# Patient Record
Sex: Female | Born: 1950 | Race: Black or African American | Hispanic: No | Marital: Married | Smoking: Former smoker
Health system: Southern US, Community
[De-identification: ages and names within clinical notes are randomized; demographics above are authoritative.]

## PROBLEM LIST (undated history)

## (undated) ENCOUNTER — Emergency Department (HOSPITAL_BASED_OUTPATIENT_CLINIC_OR_DEPARTMENT_OTHER): Payer: Medicare Other

## (undated) DIAGNOSIS — Z860100 Personal history of colon polyps, unspecified: Secondary | ICD-10-CM

## (undated) DIAGNOSIS — E559 Vitamin D deficiency, unspecified: Secondary | ICD-10-CM

## (undated) DIAGNOSIS — Z87442 Personal history of urinary calculi: Secondary | ICD-10-CM

## (undated) DIAGNOSIS — E785 Hyperlipidemia, unspecified: Secondary | ICD-10-CM

## (undated) DIAGNOSIS — J302 Other seasonal allergic rhinitis: Secondary | ICD-10-CM

## (undated) DIAGNOSIS — R011 Cardiac murmur, unspecified: Secondary | ICD-10-CM

## (undated) DIAGNOSIS — T7840XA Allergy, unspecified, initial encounter: Secondary | ICD-10-CM

## (undated) DIAGNOSIS — D649 Anemia, unspecified: Secondary | ICD-10-CM

## (undated) DIAGNOSIS — J189 Pneumonia, unspecified organism: Secondary | ICD-10-CM

## (undated) DIAGNOSIS — Z8709 Personal history of other diseases of the respiratory system: Secondary | ICD-10-CM

## (undated) DIAGNOSIS — M1990 Unspecified osteoarthritis, unspecified site: Secondary | ICD-10-CM

## (undated) DIAGNOSIS — I10 Essential (primary) hypertension: Secondary | ICD-10-CM

## (undated) DIAGNOSIS — K5792 Diverticulitis of intestine, part unspecified, without perforation or abscess without bleeding: Secondary | ICD-10-CM

## (undated) DIAGNOSIS — K219 Gastro-esophageal reflux disease without esophagitis: Secondary | ICD-10-CM

## (undated) DIAGNOSIS — R7303 Prediabetes: Secondary | ICD-10-CM

## (undated) HISTORY — PX: BREAST BIOPSY: SHX20

## (undated) HISTORY — DX: Personal history of urinary calculi: Z87.442

## (undated) HISTORY — PX: TUBAL LIGATION: SHX77

## (undated) HISTORY — PX: COLONOSCOPY: SHX174

## (undated) HISTORY — DX: Diverticulitis of intestine, part unspecified, without perforation or abscess without bleeding: K57.92

## (undated) HISTORY — PX: POLYPECTOMY: SHX149

## (undated) HISTORY — DX: Hyperlipidemia, unspecified: E78.5

## (undated) HISTORY — PX: KNEE ARTHROSCOPY: SUR90

## (undated) HISTORY — DX: Personal history of colonic polyps: Z86.010

## (undated) HISTORY — DX: Gastro-esophageal reflux disease without esophagitis: K21.9

## (undated) HISTORY — DX: Other seasonal allergic rhinitis: J30.2

## (undated) HISTORY — DX: Unspecified osteoarthritis, unspecified site: M19.90

## (undated) HISTORY — DX: Vitamin D deficiency, unspecified: E55.9

## (undated) HISTORY — PX: LAPAROSCOPIC LOW ANTERIOR RESECTION: SHX5904

## (undated) HISTORY — DX: Essential (primary) hypertension: I10

## (undated) HISTORY — DX: Allergy, unspecified, initial encounter: T78.40XA

## (undated) HISTORY — DX: Personal history of colon polyps, unspecified: Z86.0100

---

## 1994-06-10 HISTORY — PX: ESOPHAGUS SURGERY: SHX626

## 2010-10-11 LAB — HM MAMMOGRAPHY: HM Mammogram: NORMAL

## 2011-07-03 ENCOUNTER — Encounter: Payer: Self-pay | Admitting: Internal Medicine

## 2011-07-03 ENCOUNTER — Ambulatory Visit (INDEPENDENT_AMBULATORY_CARE_PROVIDER_SITE_OTHER): Payer: Federal, State, Local not specified - PPO | Admitting: Internal Medicine

## 2011-07-03 DIAGNOSIS — I10 Essential (primary) hypertension: Secondary | ICD-10-CM | POA: Insufficient documentation

## 2011-07-03 DIAGNOSIS — M1710 Unilateral primary osteoarthritis, unspecified knee: Secondary | ICD-10-CM

## 2011-07-03 DIAGNOSIS — K219 Gastro-esophageal reflux disease without esophagitis: Secondary | ICD-10-CM | POA: Insufficient documentation

## 2011-07-03 DIAGNOSIS — N200 Calculus of kidney: Secondary | ICD-10-CM | POA: Insufficient documentation

## 2011-07-03 DIAGNOSIS — M1712 Unilateral primary osteoarthritis, left knee: Secondary | ICD-10-CM | POA: Insufficient documentation

## 2011-07-03 MED ORDER — LOSARTAN POTASSIUM-HCTZ 100-25 MG PO TABS: 1.0000 | ORAL_TABLET | Freq: Every day | ORAL | Status: DC

## 2011-07-03 NOTE — Assessment & Plan Note (Signed)
BP Readings from Last 3 Encounters:  07/03/11 140/82   Change lotrel to max dose ARB+hctz -  may need to resume amlodipine if subop control Cough on ACEI noted Recheck 6 weeks to titrate, sooner if needed

## 2011-07-03 NOTE — Assessment & Plan Note (Signed)
Will send fpor orthopedic records from [facility] and refer to local specialist given pending need for knee replacement (per pt ) recommended tylenol as needed with aleve prn

## 2011-07-03 NOTE — Patient Instructions (Signed)
It was good to see you today. We have reviewed your prior records including labs and tests today we will send to your prior provider(s) for "release of records" as discussed today -  Stop Lotrel and start losartan hct for blood pressure at lower cost - Your prescription(s) have been submitted to your pharmacy. Please take as directed and contact our office if you believe you are having problem(s) with the medication(s). we'll make referral to local orthopedist and urologist as discussed. Our office will contact you regarding appointment(s) once made. Please schedule followup in 6 weeks to recheck blood pressure and adjust medications as needed, call sooner if problems.

## 2011-07-03 NOTE — Progress Notes (Signed)
Subjective:    Patient ID: Frances Gray, female    DOB: Jan 20, 1951, 60 y.o.   MRN: [id number]  HPI New pt to me and our practice, here to establish care -moved from [place] to [place] in summer 2012 Reviewed chronic medical issues today  HTN - the patient reports compliance with medication(s) as prescribed. Denies adverse side effects. Requests change meds due to $concerns - no chest pain, headache or edema  Kidney stones - recurrent events in past 6 months since moving to [place]. Has been to the emergency room in [facility] recently and advised to see urology for same - requests referral assistance for this now in [place] location practice  osteoarthritis - R>L pain, knee scope in [place] for same 2006 - takes OTC aleve 2-3x/week for control of pain symptoms - no swelling but previously told needs partial knee replacement - would like to meet local ortho  Past Medical History  Diagnosis Date  . Arthritis   . GERD (gastroesophageal reflux disease)   . Allergic rhinitis, seasonal   . Hypertension   . History of chicken pox   . Diverticulitis   . Eating disorder   . Emphysema   . Headaches, cluster   . History of colon polyps   . History of kidney stones    Family History  Problem Relation Age of Onset  . Ovarian cancer Mother   . Colon cancer Other   . Hypertension Other   . Hyperlipidemia Other   . Heart disease Other   . Breast cancer Other    History  Substance Use Topics  . Smoking status: Former Games developer  . Smokeless tobacco: Not on file  . 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  Respiratory: Negative for cough and shortness of breath.   Cardiovascular: Negative for chest pain or palpitations.  Gastrointestinal: Negative for abdominal pain, no bowel changes.  Musculoskeletal: Negative for gait  problem or joint swelling.  Skin: Negative for rash.  Neurological: Negative for dizziness or headache.  No other specific complaints in a complete review of systems (except as listed in HPI above).     Objective:   Physical Exam BP 140/82  Pulse 65  Temp(Src) 97.9 F (36.6 C) (Oral)  Ht 5\' 4"  (1.626 m)  Wt 249 lb 12.8 oz (113.309 kg)  BMI 42.88 kg/m2  SpO2 96% Wt Readings from Last 3 Encounters:  07/03/11 249 lb 12.8 oz (113.309 kg)   Constitutional: She is obese but appears well-developed and well-nourished. No distress.  HENT: Head: Normocephalic and atraumatic. Ears: B TMs ok, no erythema or effusion; Nose: Nose normal. Mouth/Throat: Oropharynx is clear and moist. No oropharyngeal exudate.  Eyes: Wears glasses - EOM are normal. Pupils are equal, round, and reactive to light. No scleral icterus.  Neck: Normal range of motion. Neck supple. No JVD present. No thyromegaly present.  Cardiovascular: Normal rate, regular rhythm and normal heart sounds.  No murmur heard. No BLE edema. Pulmonary/Chest: Effort normal and breath sounds normal. No respiratory distress. She has no wheezes.  Abdominal: Soft. Bowel sounds are normal. She exhibits no distension. There is no tenderness. no masses Musculoskeletal: R>L knee - boggy synovitis - tender to palpation over joint line; FROM and ligamentous function intact - otherwise, normal range of motion, no joint effusions. No gross deformities Neurological: She is alert and oriented to  person, place, and time. No cranial nerve deficit. Coordination normal.  Skin: Skin is warm and dry. No rash noted. No erythema.  Psychiatric: She has a normal mood and affect. Her behavior is normal. Judgment and thought content normal.   No results found for this basename: WBC, HGB, HCT, PLT, GLUCOSE, CHOL, TRIG, HDL, LDLDIRECT, LDLCALC, ALT, AST, NA, K, CL, CREATININE, BUN, CO2, TSH, PSA, INR, GLUF, HGBA1C, MICROALBUR       Assessment & Plan:  See problem list.  Medications and labs reviewed today.  Time spent with patient today 45 minutes, greater than 50% time spent counseling patient on osteoarthritis, hypertension and medication review. Also need to obtain and review of prior records from providers in [place]

## 2011-07-03 NOTE — Assessment & Plan Note (Signed)
Recurrent issue - worse since move to [place] from [facility] summer 2012 Will refer to uro at pt request -

## 2011-07-11 ENCOUNTER — Other Ambulatory Visit: Payer: Self-pay

## 2011-07-11 MED ORDER — PROMETHAZINE HCL 25 MG PO TABS: 25.0000 mg | ORAL_TABLET | Freq: Four times a day (QID) | ORAL | Status: AC | PRN

## 2011-07-11 MED ORDER — HYDROCODONE-ACETAMINOPHEN 5-500 MG PO TABS: 1.0000 | ORAL_TABLET | Freq: Four times a day (QID) | ORAL | Status: AC | PRN

## 2011-07-11 NOTE — Telephone Encounter (Signed)
Pt's spouse informed 

## 2011-07-11 NOTE — Telephone Encounter (Signed)
Ok thanks 

## 2011-07-11 NOTE — Telephone Encounter (Signed)
Pt's spouse called stating pt has an "attack" of kidney stones last night and was seen at the [facility] UC. Pt is now c/o nausea and pain. She was not given medication for nausea and only a small quantity of Lortab which will not be enough to last until Urology appt 02/08. Pt is requesting Rx for nausea as well as refill of Lortab, please advise.

## 2011-07-16 ENCOUNTER — Telehealth: Payer: Self-pay | Admitting: *Deleted

## 2011-07-16 NOTE — Telephone Encounter (Signed)
MD received films on pt mammogram [facility]. Per md for pt to come get film because we don't have away to keep fils. Called pt she states she will come on Thursday after her urologist appt.... 07/16/11@4 :06pm/LMB

## 2011-07-22 NOTE — Telephone Encounter (Signed)
Pt came by this am to pick -up films. Gave to [place] to give to patient... 07/22/11@12 :14pm/LMB

## 2011-07-26 ENCOUNTER — Telehealth: Payer: Self-pay | Admitting: Internal Medicine

## 2011-07-26 NOTE — Telephone Encounter (Signed)
Received 27 pages of records from [facility] 07/26/2011 Forwarded to Dr. Felicity Coyer to review 07/26/2011 ASW.

## 2011-08-05 ENCOUNTER — Other Ambulatory Visit: Payer: Self-pay | Admitting: Urology

## 2011-08-06 ENCOUNTER — Encounter (HOSPITAL_COMMUNITY): Payer: Self-pay | Admitting: *Deleted

## 2011-08-06 NOTE — Progress Notes (Signed)
Reminded no aspirin products 4 days prior to ESWL and to take laxative 08/11/11/pm

## 2011-08-08 ENCOUNTER — Encounter (HOSPITAL_COMMUNITY): Payer: Self-pay | Admitting: Pharmacy Technician

## 2011-08-09 HISTORY — PX: LITHOTRIPSY: SUR834

## 2011-08-09 NOTE — H&P (Addendum)
History of Present Illness  [name] seen today as a referral from Dr. Rene Paci for evaluation of nephrolithasis. She initially had right flank pain 2 months ago and then had another colic attack about a month ago. She was seen at [facility] ER for right flank pain. CTU: moderate right hydronephrosis secondary to 7 mm right UPJ calculus. She is taking Tamsulosin 0.4 mg 1 po daily. Pain is controlled with PRN Hydrocodone. This is her 3rd stone episode. Last stone was in 2007 which she feels she passed spontanously but denies ever seeing stone. Denies any surgical intervention of stones in the past ie: ESWL or cystourerterscopy, stone extraction.   Interval Hx She has not passed a stone since her last visit. She has occasional mild right flank pain. No N/V, F/C. She continues on tamsulosin.   KUB today: There is a stone visible at the right ureteropelvic junction. The stone has failed to progress. There is also a stone in the right midpole.    Past Medical History Problems  1. History of  Arthritis V13.4 2. History of  Esophageal Reflux 530.81 3. History of  Hypertension 401.9 4. History of  Murmurs 785.2 5. History of  Nephrolithiasis V13.01  Surgical History Problems  1. History of  Arthroscopy Knee Left 2. History of  Arthroscopy Knee Right 3. History of  Breast Surgery Right 4. History of  Breast Surgery Left 5. History of  Surgery Of Esophagus  Current Meds 1. Hydrocodone-Acetaminophen 7.5-325 MG Oral Tablet; TAKE 1 TO 2 TABLETS EVERY 4 TO 6  HOURS AS NEEDED FOR PAIN; Therapy: 07Feb2013 to (Evaluate:11Feb2013); Last  Rx:07Feb2013 2. Losartan Potassium-HCTZ 100-25 MG Oral Tablet; Therapy: (Recorded:07Feb2013) to 3. Multi-Vitamin Oral Tablet; Therapy: (Recorded:07Feb2013) to 4. Promethazine HCl 25 MG Oral Tablet; Therapy: (Recorded:07Feb2013) to 5. Tamsulosin HCl 0.4 MG Oral Capsule; TAKE 1 CAPSULE Daily; Therapy: 07Feb2013 to  (Evaluate:21Feb2013)  Requested for:  07Feb2013; Last Rx:07Feb2013 6. Vitamin D3 CAPS; Therapy: (Recorded:07Feb2013) to  Allergies Medication  1. No Known Drug Allergies  Family History Problems  1. Sororal history of  Breast Cancer V16.3 2. Sororal history of  Colon Cancer V16.0 3. Family history of  Death In The Family Father 4. Family history of  Death In The Family Mother Deceased; ovarian cancer 5. Family history of  Family Health Status Number Of Children 1 son & 1 daughter 32. Paternal history of  Nephrolithiasis  Social History Problems  1. Caffeine Use 3 2. Former Smoker V15.82 quit in 1986 3. Marital History - Currently Married 4. Occupation: Retired Nurse, children's  5. History of  Alcohol Use  Review of Systems Constitutional, skin, eye, otolaryngeal, hematologic/lymphatic, cardiovascular, pulmonary, endocrine, musculoskeletal, gastrointestinal, neurological and psychiatric system(s) were reviewed and pertinent findings if present are noted.    Vitals  25Feb2013 08:35AM Blood Pressure: 136 / 86 Temperature: 97.7 F Heart Rate: 56   Physical Exam Constitutional: Well nourished and well developed . No acute distress.  Pulmonary: No respiratory distress and normal respiratory rhythm and effort.  Cardiovascular: Heart rate and rhythm are normal . No peripheral edema.  Neuro/Psych:. Mood and affect are appropriate.    Results/Data Selected Results  UA With REFLEX 25Feb2013 08:13AM Frances Gray   Test Name Result Flag Reference  COLOR STRAW  YELLOW  APPEARANCE CLEAR  CLEAR  SPECIFIC GRAVITY 1.020  1.005-1.030  pH 5.0  5.0-8.0  GLUCOSE NEG mg/dL  NEG  BILIRUBIN NEG  NEG  KETONE NEG mg/dL  NEG  BLOOD NEG  NEG  PROTEIN  NEG mg/dL  NEG  UROBILINOGEN 0.2 mg/dL  1.6-1.0  NITRITE NEG  NEG  LEUKOCYTE ESTERASE NEG  NEG   Assessment Assessed  1. Hydronephrosis On The Right 591 2. Ureteral Stone 592.1  Plan Health Maintenance (V70.0)  1. UA With REFLEX  Done: 25Feb2013 08:13AM Hydronephrosis On  The Right (591), Ureteral Stone (592.1), Nephrolithiasis Of The Right Kidney (592.0)  2. Follow-up Schedule Surgery Office  Follow-up  Done: 25Feb2013  Discussion/Summary  I reviewed the KUB image with the patient and her husband and drew a picture of the anatomy. We discussed the nature, risks and benefits of continued stone passage with medical expulsion therapy (alpha blockers), cysto/stent/ureteroscopy/laser lithotripsy (fail to gain retrograde access, need for repeat therapy/multiple procedures, ureteral injury, stent pain among others), ESWL (failure to fragment, failure to pass fragments, obstruction, need for additional procedures/stenting, bleeding requiring transfusion or embolization, infection, damage to kidney/ureter/other structures, pain among others). All questions answered. Pt elects to proceed with right SWL. I emphasized importance of bringing the disc so that I can review her CT scan images. We discussed treatment goals may not be reached and/or may require multiple/repeat procedures. We discussed the likelihood of achieving the goals of the procedure and potential problems that might occur during the procedure or recuperation. We discussed given the nature of shockwave we would concentrate on the right UPJ stone and likely not be able to treat the right kidney stone.  H&P update Pt seen and examined. No change in H&P. No stone passage. CT reviewed images -- 7 mm rt UPJ stone and a separate RLP stone.

## 2011-08-12 ENCOUNTER — Ambulatory Visit (HOSPITAL_COMMUNITY): Payer: Federal, State, Local not specified - PPO

## 2011-08-12 ENCOUNTER — Ambulatory Visit (HOSPITAL_COMMUNITY)
Admission: RE | Admit: 2011-08-12 | Discharge: 2011-08-12 | Disposition: A | Discharge status: Home / Self Care | Payer: Federal, State, Local not specified - PPO | Source: Ambulatory Visit | Attending: Urology | Admitting: Urology

## 2011-08-12 ENCOUNTER — Encounter (HOSPITAL_COMMUNITY)
Admission: RE | Disposition: A | Discharge status: Home / Self Care | Payer: Self-pay | Source: Ambulatory Visit | Attending: Urology

## 2011-08-12 ENCOUNTER — Encounter (HOSPITAL_COMMUNITY): Payer: Self-pay | Admitting: *Deleted

## 2011-08-12 DIAGNOSIS — N201 Calculus of ureter: Secondary | ICD-10-CM | POA: Insufficient documentation

## 2011-08-12 DIAGNOSIS — K219 Gastro-esophageal reflux disease without esophagitis: Secondary | ICD-10-CM | POA: Insufficient documentation

## 2011-08-12 DIAGNOSIS — R011 Cardiac murmur, unspecified: Secondary | ICD-10-CM | POA: Insufficient documentation

## 2011-08-12 DIAGNOSIS — I10 Essential (primary) hypertension: Secondary | ICD-10-CM | POA: Insufficient documentation

## 2011-08-12 DIAGNOSIS — M129 Arthropathy, unspecified: Secondary | ICD-10-CM | POA: Insufficient documentation

## 2011-08-12 DIAGNOSIS — Z79899 Other long term (current) drug therapy: Secondary | ICD-10-CM | POA: Insufficient documentation

## 2011-08-12 HISTORY — DX: Cardiac murmur, unspecified: R01.1

## 2011-08-12 HISTORY — DX: Anemia, unspecified: D64.9

## 2011-08-12 SURGERY — LITHOTRIPSY, ESWL
Anesthesia: LOCAL | Laterality: Right

## 2011-08-12 MED ORDER — DEXTROSE-NACL 5-0.45 % IV SOLN
INTRAVENOUS | Status: DC
  Administered 2011-08-12: 07:00:00 via INTRAVENOUS

## 2011-08-12 MED ORDER — MORPHINE SULFATE 10 MG/ML IJ SOLN: 2.0000 mg | INTRAMUSCULAR | Status: DC | PRN

## 2011-08-12 MED ORDER — DIPHENHYDRAMINE HCL 25 MG PO CAPS
25.0000 mg | ORAL_CAPSULE | ORAL | Status: AC
  Administered 2011-08-12: 25 mg via ORAL

## 2011-08-12 MED ORDER — CIPROFLOXACIN HCL 500 MG PO TABS
500.0000 mg | ORAL_TABLET | ORAL | Status: AC
  Administered 2011-08-12: 500 mg via ORAL

## 2011-08-12 MED ORDER — PROMETHAZINE HCL 25 MG/ML IJ SOLN: 12.5000 mg | Freq: Four times a day (QID) | INTRAMUSCULAR | Status: DC | PRN

## 2011-08-12 MED ORDER — NAPROXEN SODIUM 220 MG PO TABS: 220.0000 mg | ORAL_TABLET | Freq: Two times a day (BID) | ORAL | Status: DC | PRN

## 2011-08-12 MED ORDER — DIAZEPAM 5 MG PO TABS
10.0000 mg | ORAL_TABLET | ORAL | Status: AC
Start: 2011-08-12 — End: 2011-08-12
  Administered 2011-08-12: 10 mg via ORAL

## 2011-08-12 MED ORDER — OXYCODONE-ACETAMINOPHEN 5-325 MG PO TABS: 1.0000 | ORAL_TABLET | ORAL | Status: AC | PRN

## 2011-08-12 MED ORDER — CIPROFLOXACIN HCL 500 MG PO TABS: 500.0000 mg | ORAL_TABLET | Freq: Two times a day (BID) | ORAL | Status: AC

## 2011-08-12 NOTE — Discharge Instructions (Signed)
Lithotripsy Care After Refer to this sheet for the next few weeks. These discharge instructions provide you with general information on caring for yourself after you leave the hospital. Your caregiver may also give you specific instructions. Your treatment has been planned according to the most current medical practices available, but unavoidable complications sometimes occur. If you have any problems or questions after discharge, please call your caregiver. AFTER THE PROCEDURE   The recovery time will vary with the procedure done.   You will be taken to the recovery area. A nurse will watch and check your progress. Once you are awake, stable, and taking fluids well, you will be allowed to go home as long as there are no problems.   Your urine may have a red tinge for a few days after treatment. Blood loss is usually minimal.   You may have soreness in the back or flank area. This usually goes away after a few days. The procedure can cause blotches or bruises on the back where the pressure wave enters the skin. These marks usually cause only minimal discomfort and should disappear in a short time.   Stone fragments should begin to pass within 24 hours of treatment. However, a delayed passage is not unusual.   You may have pain, discomfort, and feel sick to your stomach (nauseous) when the crushed (pulverized) fragments of stone are passed down the tube from the kidney to the bladder. Stone fragments can pass soon after the procedure and may last for up to 4 to 8 weeks.   A small number of patients may have severe pain when stone fragments are not able to pass, which leads to an obstruction.   You may require more than 1 treatment.  HOME CARE INSTRUCTIONS   Rest at home until you feel your energy improving.   Only take over-the-counter or prescription medicines for pain, discomfort, or fever as directed by your caregiver. Depending on the type of lithotripsy, you may need to take medicines  (antibiotics) that kill germs and anti-inflammatory medicines for a few days.   Drink enough water and fluids to keep your urine clear or pale yellow. This helps "flush" your kidneys. It helps pass any remaining pieces of stone and prevents stones from coming back.   Most people can resume daily activities within 1 or 2 days after standard lithotripsy.    If the stones are in your urinary system, you may be asked to strain your urine at home to look for stones. Any stones that are found can be sent to a medical lab for examination.   Visit your caregiver for a follow-up appointment in a few weeks. Your doctor may remove your stent if you have one. Your caregiver will also check to see whether stone particles still remain.  SEEK MEDICAL CARE IF:   You have an oral temperature above 102 F (38.9 C).   Your pain is not relieved by medicine.   You have a lasting nauseous feeling.   You feel there is too much blood in the urine.   You develop persistent problems with frequent and/or painful urination that does not at least partially improve after 2 days following the procedure.   You have a congested cough.   You feel lightheaded.   You develop a rash or any other signs that might suggest an allergic problem.   You develop any reaction or side effects to your medicine(s).  SEEK IMMEDIATE MEDICAL CARE IF:   You experience severe back  and/or flank pain.   You see nothing but blood when you urinate.   You cannot pass any urine at all.   You have an oral temperature above 102 F (38.9 C), not controlled by medicine.   You develop shortness of breath, difficulty breathing, or chest pain.   You develop vomiting that will not stop after 6 to 8 hours.   You have a fainting episode.  MAKE SURE YOU:   Understand these instructions.   Will watch your condition.   Will get help right away if you are not doing well or get worse.  Document Released: 06/16/2007 Document Revised:  05/16/2011 Document Reviewed: 06/16/2007 [facility] Patient Information 2012 [place], [place].

## 2011-08-12 NOTE — Brief Op Note (Signed)
08/12/2011  8:17 AM  PATIENT:  Frances Gray  61 y.o. female  PRE-OPERATIVE DIAGNOSIS:  Right Ureteral Stone  POST-OPERATIVE DIAGNOSIS: right ureteral stone  PROCEDURE:  Procedure(s) (LRB): EXTRACORPOREAL SHOCK WAVE LITHOTRIPSY (ESWL) (Right)  SURGEON:  Surgeon(s) and Role:    * Antony Haste, MD - Primary  ANESTHESIA:   IV sedation  DICTATION: scanned  PLAN OF CARE: Discharge to home after PACU  PATIENT DISPOSITION:  PACU - hemodynamically stable.   Delay start of Pharmacological VTE agent (>24hrs) due to surgical blood loss or risk of bleeding: not applicable

## 2011-08-13 ENCOUNTER — Encounter (HOSPITAL_COMMUNITY): Payer: Self-pay

## 2011-08-14 ENCOUNTER — Ambulatory Visit (INDEPENDENT_AMBULATORY_CARE_PROVIDER_SITE_OTHER): Payer: Federal, State, Local not specified - PPO | Admitting: Internal Medicine

## 2011-08-14 ENCOUNTER — Encounter: Payer: Self-pay | Admitting: Internal Medicine

## 2011-08-14 ENCOUNTER — Other Ambulatory Visit (INDEPENDENT_AMBULATORY_CARE_PROVIDER_SITE_OTHER): Payer: Federal, State, Local not specified - PPO

## 2011-08-14 DIAGNOSIS — D649 Anemia, unspecified: Secondary | ICD-10-CM | POA: Insufficient documentation

## 2011-08-14 DIAGNOSIS — M1710 Unilateral primary osteoarthritis, unspecified knee: Secondary | ICD-10-CM

## 2011-08-14 DIAGNOSIS — E559 Vitamin D deficiency, unspecified: Secondary | ICD-10-CM

## 2011-08-14 DIAGNOSIS — N200 Calculus of kidney: Secondary | ICD-10-CM

## 2011-08-14 DIAGNOSIS — I10 Essential (primary) hypertension: Secondary | ICD-10-CM

## 2011-08-14 HISTORY — DX: Vitamin D deficiency, unspecified: E55.9

## 2011-08-14 LAB — CBC WITH DIFFERENTIAL/PLATELET
Basophils Relative: 1.2 % (ref 0.0–3.0)
Eosinophils Relative: 3.5 % (ref 0.0–5.0)
HCT: 37.6 % (ref 36.0–46.0)
Lymphs Abs: 1.5 10*3/uL (ref 0.7–4.0)
MCV: 83.8 fl (ref 78.0–100.0)
Monocytes Absolute: 0.7 10*3/uL (ref 0.1–1.0)
Monocytes Relative: 11.4 % (ref 3.0–12.0)
RBC: 4.49 Mil/uL (ref 3.87–5.11)
WBC: 5.8 10*3/uL (ref 4.5–10.5)

## 2011-08-14 LAB — VITAMIN B12: Vitamin B-12: 331 pg/mL (ref 211–911)

## 2011-08-14 NOTE — Progress Notes (Signed)
  Subjective:    Patient ID: Frances Gray, female    DOB: 23-Feb-1951, 61 y.o.   MRN: [id number]  HPI  Here for follow up - reviewed chronic medical issues today  HTN - the patient reports compliance with medication(s) as prescribed. Denies adverse side effects. Requests changed Lotrel to generic ARB/hct 06/2011 due to $concerns - no chest pain, headache or edema  Kidney stones - recurrent events summer-fall 2012 since moving to [place]. S/p lithotripsy for R ur. Stone 08/2011 - imporved, following with uro now  osteoarthritis - R>L pain, knee scope in [place] for same 2006 - takes OTC aleve 2-3x/week for control of pain symptoms - no swelling but previously told needs partial knee replacement - est with local ortho 07/2011 ([place])  Past Medical History  Diagnosis Date  . Arthritis     R knee  . GERD (gastroesophageal reflux disease)   . Allergic rhinitis, seasonal   . Hypertension   . History of chicken pox   . Diverticulitis   . Emphysema   . Headaches, cluster   . History of colon polyps   . Heart murmur     no symptoms  . Anemia     has had anemia in the past. none now  . Recurrent kidney stones 04/2011    Rt ureteral stone s/p lithotripsy 3/13    History  Substance Use Topics  . Smoking status: Former Games developer  . Smokeless tobacco: Not on file  . 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  Respiratory: Negative for cough and shortness of breath.   Cardiovascular: Negative for chest pain or palpitations.       Objective:   Physical Exam  BP 122/80  Pulse 59  Temp(Src) 97.6 F (36.4 C) (Oral)  Ht 5\' 3"  (1.6 m)  Wt 248 lb 6.4 oz (112.674 kg)  BMI 44.00 kg/m2  SpO2 98% Wt Readings from Last 3 Encounters:  08/14/11 248 lb 6.4 oz (112.674 kg)  08/12/11 245 lb 4 oz (111.245 kg)  08/12/11 245 lb 4 oz  (111.245 kg)   Constitutional: She is obese but appears well-developed and well-nourished. No distress.  Neck: Normal range of motion. Neck supple. No JVD present. No thyromegaly present.  Cardiovascular: Normal rate, regular rhythm and normal heart sounds.  No murmur heard. No BLE edema. Pulmonary/Chest: Effort normal and breath sounds normal. No respiratory distress. She has no wheezes.  Musculoskeletal: R>L knee - boggy synovitis - tender to palpation over joint line; FROM and ligamentous function intact - otherwise, normal range of motion, no joint effusions. No gross deformities Psychiatric: She has a normal mood and affect. Her behavior is normal. Judgment and thought content normal.   No results found for this basename: WBC,  HGB,  HCT,  PLT,  GLUCOSE,  CHOL,  TRIG,  HDL,  LDLDIRECT,  LDLCALC,  ALT,  AST,  NA,  K,  CL,  CREATININE,  BUN,  CO2,  TSH,  PSA,  INR,  GLUF,  HGBA1C,  MICROALBUR       Assessment & Plan:  See problem list. Medications and labs reviewed today.

## 2011-08-14 NOTE — Assessment & Plan Note (Signed)
Recurrent issue - worse since move to [place] from [facility] summer 2012 S/p uro eval and lithotripsy 08/2011 - reviewed No current symptoms

## 2011-08-14 NOTE — Assessment & Plan Note (Signed)
BP Readings from Last 3 Encounters:  08/14/11 122/80  08/12/11 163/85  08/12/11 163/85   Changed lotrel to max dose ARB+hctz  06/2011 due to $ Cough on ACEI noted The current medical regimen is effective;  continue present plan and medications.

## 2011-08-14 NOTE — Assessment & Plan Note (Signed)
On OTC meds - recheck same now at pt request denies osteopenia or other bone problems but consider DEXA at next CPX

## 2011-08-14 NOTE — Assessment & Plan Note (Signed)
Pt describes hx same and "shots when <30" - ?aranesp for iron def or B12 per pt - last shot spring 2012 Check CBC and iron/B12 now -

## 2011-08-14 NOTE — Assessment & Plan Note (Signed)
Reviewed orthopedic records from [facility] - synvisc injection x 5 spring 2012 Following with local ortho ([place]) pending need for R knee replacement  recommended tylenol as needed with aleve prn

## 2011-08-14 NOTE — Patient Instructions (Signed)
It was good to see you today. We have reviewed your interval medical records including labs, kidney stone treatment and tests today Test(s) ordered today. Your results will be called to you after review (48-72hours after test completion). If any changes need to be made, you will be notified at that time. Medications reviewed, no changes at this time. Please schedule followup in 6 months for physical and labs + BP recheck, call sooner if problems.

## 2011-11-28 ENCOUNTER — Ambulatory Visit (INDEPENDENT_AMBULATORY_CARE_PROVIDER_SITE_OTHER): Payer: Federal, State, Local not specified - PPO | Admitting: Surgery

## 2011-11-28 ENCOUNTER — Encounter (INDEPENDENT_AMBULATORY_CARE_PROVIDER_SITE_OTHER): Payer: Self-pay | Admitting: Surgery

## 2011-11-28 VITALS — BP 138/86 | HR 66 | Temp 97.1°F | Resp 16 | Ht 64.0 in | Wt 250.2 lb

## 2011-11-28 DIAGNOSIS — E042 Nontoxic multinodular goiter: Secondary | ICD-10-CM

## 2011-11-28 DIAGNOSIS — E213 Hyperparathyroidism, unspecified: Secondary | ICD-10-CM

## 2011-11-28 DIAGNOSIS — N200 Calculus of kidney: Secondary | ICD-10-CM

## 2011-11-28 NOTE — Progress Notes (Signed)
General Surgery [facility]  Chief Complaint  Patient presents with  . New Evaluation    elevated PTH level - possible primary hyperparathyroidism - referral from Dr. [name]    HISTORY: The patient is a 61 year old female referred by her urologist for evaluation of possible primary hyperparathyroidism leading to nephrolithiasis. Patient has had 4 episodes of nephrolithiasis dating back several years. Patient was recently evaluated by urology. This included laboratory studies. Calcium levels were normal at 9.2 and 9.4. However the intact parathyroid hormone level was elevated at 100.1. 25 hydroxy vitamin D level was normal at 37. Patient apparently had a 24-hour urine collection for calcium. Those results are not available at present. Patient has not had any radiographic imaging.  Patient does note a family history of thyroid goiter in her brother. He required surgery. There is no family history of endocrine malignancy.  Past Medical History  Diagnosis Date  . Arthritis     R knee  . GERD (gastroesophageal reflux disease)   . Allergic rhinitis, seasonal   . Hypertension   . History of chicken pox   . Diverticulitis   . Emphysema   . Headaches, cluster   . History of colon polyps   . Heart murmur     no symptoms  . Anemia     has had anemia in the past. none now  . 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 Vitamin D deficiency disease 08/14/2011     Current Outpatient Prescriptions  Medication Sig Dispense Refill  . Cholecalciferol (VITAMIN D3) 2000 UNITS TABS Take 2,000 Units by mouth daily.       . Cyanocobalamin (B-12 PO) Take by mouth.      . losartan-hydrochlorothiazide (HYZAAR) 100-25 MG per tablet Take 1 tablet by mouth at bedtime.      . Multiple Vitamin (MULTIVITAMIN) tablet Take 1 tablet by mouth daily.      . naproxen sodium (ANAPROX) 220 MG tablet Take 1 tablet (220 mg total) by mouth 2 (two) times  daily as needed. PAIN         Allergies  Allergen Reactions  . Amlodipine Cough     Family History  Problem Relation Age of Onset  . Ovarian cancer Mother   . Cancer Mother     Ovarian Cancer  . Colon cancer Other   . Hypertension Other   . Hyperlipidemia Other   . Heart disease Other   . Breast cancer Other   . Cancer Sister     Breast & Colon Cancer     History   Social History  . Marital Status: Married    Spouse Name: N/A    Number of Children: N/A  . Years of Education: N/A   Social History Main Topics  . Smoking status: Former Games developer  . Smokeless tobacco: None  . Alcohol Use: No  . Drug Use: No  . Sexually Active: None   Other Topics Concern  . None   Social History Narrative  . None     REVIEW OF SYSTEMS - PERTINENT POSITIVES ONLY: Nephrolithiasis, moderate fatigue; no history of osteopenia or osteoporosis.  EXAM: Filed Vitals:   11/28/11 1133  BP: 138/86  Pulse: 66  Temp: 97.1 F (36.2 C)  Resp: 16    HEENT: normocephalic; pupils equal and reactive; sclerae clear; dentition good; mucous membranes moist NECK:  Thyroid gland is mildly enlarged, right greater than left; left lobe without dominant  or discrete mass; isthmus is thickened; right lobe appears to contain medical dominant nodules measuring approximately 2 cm in size; asymmetric on extension; no palpable anterior or posterior cervical lymphadenopathy; no supraclavicular masses; no tenderness CHEST: clear to auscultation bilaterally without rales, rhonchi, or wheezes CARDIAC: regular rate and rhythm without significant murmur; peripheral pulses are full EXT:  non-tender without edema; no deformity NEURO: no gross focal deficits; no sign of tremor   LABORATORY RESULTS: See [facility] (CHL-Epic) for most recent results   RADIOLOGY RESULTS: See [facility] (CHL[facility]Epic) for most recent results   IMPRESSION: #1 elevated parathyroid hormone level, rule out primary  hyperparathyroidism #2 enlarged thyroid with probable right thyroid nodules  PLAN: I discussed the above findings at length with the patient and her husband. I provided them with written literature to review on parathyroid disease and thyroid disease.  As for the possible parathyroid adenoma, we are going to repeat her intact PTH level and serum calcium level. We will then obtain a nuclear medicine parathyroid scan.  As for the newly diagnosed thyroid nodules, patient will undergo a thyroid ultrasound. We will obtain thyroid function test.  Patient will return for followup and review of these results when they are completed.  Velora Heckler, MD, FACS General & Endocrine Surgery [facility]   Visit Diagnoses: 1. Kidney stone   2. Hyperparathyroidism   3. Multiple thyroid nodules   4. Multinodular goiter (nontoxic)     Primary Care Physician: Rene Paci, MD

## 2011-11-28 NOTE — Patient Instructions (Signed)
Thyroid Diseases Your thyroid is a butterfly-shaped gland in your neck. It is located just above your collarbone. It is one of your endocrine glands, which make hormones. The thyroid helps set your metabolism. Metabolism is how your body gets energy from the foods you eat.  Millions of people have thyroid diseases. Women experience thyroid problems more often than men. In fact, overactive thyroid problems (hyperthyroidism) occur in 1% of all women. If you have a thyroid disease, your body may use energy more slowly or quickly than it should.  Thyroid problems also include an immune disease where your body reacts against your thyroid gland (called thyroiditis). A different problem involves lumps and bumps (called nodules) that develop in the gland. The nodules are usually, but not always, noncancerous. THE MOST COMMON THYROID PROBLEMS AND CAUSES ARE DISCUSSED BELOW There are many causes for thyroid problems. Treatment depends upon the exact diagnosis and includes trying to reset your body's metabolism to a normal rate. Hyperthyroidism Too much thyroid hormone from an overactive thyroid gland is called hyperthyroidism. In hyperthyroidism, the body's metabolism speeds up. One of the most frequent forms of hyperthyroidism is known as Graves' disease. Graves' disease tends to run in families. Although Graves' is thought to be caused by a problem with the immune system, the exact nature of the genetic problem is unknown. Hypothyroidism Too little thyroid hormone from an underactive thyroid gland is called hypothyroidism. In hypothyroidism, the body's metabolism is slowed. Several things can cause this condition. Most causes affect the thyroid gland directly and hurt its ability to make enough hormone.  Rarely, there may be a pituitary gland tumor (located near the base of the brain). The tumor can block the pituitary from producing thyroid-stimulating hormone (TSH). Your body makes TSH to stimulate the thyroid  to work properly. If the pituitary does not make enough TSH, the thyroid fails to make enough hormones needed for good health. Whether the problem is caused by thyroid conditions or by the pituitary gland, the result is that the thyroid is not making enough hormones. Hypothyroidism causes many physical and mental processes to become sluggish. The body consumes less oxygen and produces less body heat. Thyroid Nodules A thyroid nodule is a small swelling or lump in the thyroid gland. They are common. These nodules represent either a growth of thyroid tissue or a fluid-filled cyst. Both form a lump in the thyroid gland. Almost half of all people will have tiny thyroid nodules at some point in their lives. Typically, these are not noticeable until they become large and affect normal thyroid size. Larger nodules that are greater than a half inch across (about 1 centimeter) occur in about 5 percent of people. Although most nodules are not cancerous, people who have them should seek medical care to rule out cancer. Also, some thyroid nodules may produce too much thyroid hormone or become too large. Large nodules or a large gland can interfere with breathing or swallowing or may cause neck discomfort. Other problems Other thyroid problems include cancer and thyroiditis. Thyroiditis is a malfunction of the body's immune system. Normally, the immune system works to defend the body against infection and other problems. When the immune system is not working properly, it may mistakenly attack normal cells, tissues, and organs. Examples of autoimmune diseases are Hashimoto's thyroiditis (which causes low thyroid function) and Graves' disease (which causes excess thyroid function). SYMPTOMS  Symptoms vary greatly depending upon the exact type of problem with the thyroid. Hyperthyroidism-is when your thyroid is too   active and makes more thyroid hormone than your body needs. The most common cause is Graves' Disease. Too  much thyroid hormone can cause some or all of the following symptoms:  Anxiety.   Irritability.   Difficulty sleeping.   Fatigue.   A rapid or irregular heartbeat.   A fine tremor of your hands or fingers.   An increase in perspiration.   Sensitivity to heat.   Weight loss, despite normal food intake.   Brittle hair.   Enlargement of your thyroid gland (goiter).   Light menstrual periods.   Frequent bowel movements.  Graves' disease can specifically cause eye and skin problems. The skin problems involve reddening and swelling of the skin, often on your shins and on the top of your feet. Eye problems can include the following:  Excess tearing and sensation of grit or sand in either or both eyes.   Reddened or inflamed eyes.   Widening of the space between your eyelids.   Swelling of the lids and tissues around the eyes.   Light sensitivity.   Ulcers on the cornea.   Double vision.   Limited eye movements.   Blurred or reduced vision.  Hypothyroidism- is when your thyroid gland is not active enough. This is more common than hyperthyroidism. Symptoms can vary a lot depending of the severity of the hormone deficiency. Symptoms may develop over a long period of time and can include several of the following:  Fatigue.   Sluggishness.   Increased sensitivity to cold.   Constipation.   Pale, dry skin.   A puffy face.   Hoarse voice.   High blood cholesterol level.   Unexplained weight gain.   Muscle aches, tenderness and stiffness.   Pain, stiffness or swelling in your joints.   Muscle weakness.   Heavier than normal menstrual periods.   Brittle fingernails and hair.   Depression.  Thyroid Nodules - most do not cause signs or symptoms. Occasionally, some may become so large that you can feel or even see the swelling at the base of your neck. You may realize a lump or swelling is there when you are shaving or putting on makeup. Men might become  aware of a nodule when shirt collars suddenly feel too tight. Some nodules produce too much thyroid hormone. This can produce the same symptoms as hyperthyroidism (see above). Thyroid nodules are seldom cancerous. However, a nodule is more likely to be malignant (cancerous) if it:  Grows quickly or feels hard.   Causes you to become hoarse or to have trouble swallowing or breathing.   Causes enlarged lymph nodes under your jaw or in your neck.  DIAGNOSIS  Because there are so many possible thyroid conditions, your caregiver may ask for a number of tests. They will do this in order to narrow down the exact diagnosis. These tests can include:  Blood and antibody tests.   Special thyroid scans using small, safe amounts of radioactive iodine.   Ultrasound of the thyroid gland (particularly if there is a nodule or lump).   Biopsy. This is usually done with a special needle. A needle biopsy is a procedure to obtain a sample of cells from the thyroid. The tissue will be tested in a lab and examined under a microscope.  TREATMENT  Treatment depends on the exact diagnosis. Hyperthyroidism  Beta-blockers help relieve many of the symptoms.   Anti-thyroid medications prevent the thyroid from making excess hormones.   Radioactive iodine treatment can destroy overactive thyroid   cells. The iodine can permanently decrease the amount of hormone produced.   Surgery to remove the thyroid gland.   Treatments for eye problems that come from Graves' disease also include medications and special eye surgery, if felt to be appropriate.  Hypothyroidism Thyroid replacement with levothyroxine is the mainstay of treatment. Treatment with thyroid replacement is usually lifelong and will require monitoring and adjustment from time to time. Thyroid Nodules  Watchful waiting. If a small nodule causes no symptoms or signs of cancer on biopsy, then no treatment may be chosen at first. Re-exam and re-checking blood  tests would be the recommended follow-up.   Anti-thyroid medications or radioactive iodine treatment may be recommended if the nodules produce too much thyroid hormone (see Treatment for Hyperthyroidism above).   Alcohol ablation. Injections of small amounts of ethyl alcohol (ethanol) can cause a non-cancerous nodule to shrink in size.   Surgery (see Treatment for Hyperthyroidism above).  HOME CARE INSTRUCTIONS   Take medications as instructed.   Follow through on recommended testing.  SEEK MEDICAL CARE IF:   You feel that you are developing symptoms of Hyperthyroidism or Hypothyroidism as described above.   You develop a new lump/nodule in the neck/thyroid area that you had not noticed before.   You feel that you are having side effects from medicines prescribed.   You develop trouble breathing or swallowing.  SEEK IMMEDIATE MEDICAL CARE IF:   You develop a fever of 102 F (38.9 C) or higher.   You develop severe sweating.   You develop palpitations and/or rapid heart beat.   You develop shortness of breath.   You develop nausea and vomiting.   You develop extreme shakiness.   You develop agitation.   You develop lightheadedness or have a fainting episode.  Document Released: 03/24/2007 Document Revised: 05/16/2011 Document Reviewed: 03/24/2007 [employer] Patient Information 2012 [employer]. 

## 2011-11-29 LAB — PTH, INTACT AND CALCIUM
Calcium, Total (PTH): 9.8 mg/dL (ref 8.4–10.5)
PTH: 89.7 pg/mL — ABNORMAL HIGH (ref 14.0–72.0)

## 2011-11-29 LAB — TSH: TSH: 2.724 u[IU]/mL (ref 0.350–4.500)

## 2011-11-29 LAB — T3: T3, Total: 127.2 ng/dL (ref 80.0–204.0)

## 2011-12-09 ENCOUNTER — Encounter (INDEPENDENT_AMBULATORY_CARE_PROVIDER_SITE_OTHER): Payer: Self-pay

## 2011-12-17 ENCOUNTER — Encounter (HOSPITAL_COMMUNITY)
Admission: RE | Admit: 2011-12-17 | Discharge: 2011-12-17 | Disposition: A | Discharge status: Home / Self Care | Payer: Federal, State, Local not specified - PPO | Source: Ambulatory Visit | Attending: Surgery | Admitting: Surgery

## 2011-12-17 ENCOUNTER — Encounter (HOSPITAL_COMMUNITY): Payer: Self-pay

## 2011-12-17 DIAGNOSIS — E213 Hyperparathyroidism, unspecified: Secondary | ICD-10-CM

## 2011-12-17 DIAGNOSIS — E042 Nontoxic multinodular goiter: Secondary | ICD-10-CM | POA: Insufficient documentation

## 2011-12-17 DIAGNOSIS — N200 Calculus of kidney: Secondary | ICD-10-CM

## 2011-12-17 MED ORDER — TECHNETIUM TC 99M SESTAMIBI - CARDIOLITE
25.9000 | Freq: Once | INTRAVENOUS | Status: AC | PRN
  Administered 2011-12-17: 25.9 via INTRAVENOUS

## 2011-12-18 ENCOUNTER — Encounter (INDEPENDENT_AMBULATORY_CARE_PROVIDER_SITE_OTHER): Payer: Self-pay

## 2011-12-24 ENCOUNTER — Encounter (INDEPENDENT_AMBULATORY_CARE_PROVIDER_SITE_OTHER): Payer: Self-pay | Admitting: Surgery

## 2011-12-24 ENCOUNTER — Ambulatory Visit (INDEPENDENT_AMBULATORY_CARE_PROVIDER_SITE_OTHER): Payer: Federal, State, Local not specified - PPO | Admitting: Surgery

## 2011-12-24 VITALS — BP 143/98 | HR 72 | Temp 97.2°F | Ht 64.0 in | Wt 254.2 lb

## 2011-12-24 DIAGNOSIS — N200 Calculus of kidney: Secondary | ICD-10-CM

## 2011-12-24 DIAGNOSIS — E042 Nontoxic multinodular goiter: Secondary | ICD-10-CM

## 2011-12-24 NOTE — Progress Notes (Signed)
General Surgery [facility]  Visit Diagnoses: 1. Multinodular goiter (nontoxic)   2. Kidney stone     HISTORY: Patient returns for followup. Patient underwent thyroid ultrasound which demonstrates a multinodular thyroid goiter with a dominant nodule on the right side measuring 3.1 cm and a dominant nodule on the left side measuring 2.4 cm. Fine-needle aspiration biopsy was recommended for both lesions.  Patient had repeat laboratory studies performed showing an elevated PTH level of 89.7 with a normal serum calcium level of 9.8. Nuclear medicine parathyroid scan was negative for parathyroid adenoma.  Thyroid function test were obtained and show a normal TSH of 2.7-4 and normal levels for total T4 and total T3.  PERTINENT REVIEW OF SYSTEMS: No significant change  EXAM: HEENT: normocephalic; pupils equal and reactive; sclerae clear; dentition good; mucous membranes moist NECK:  Palpable dominant nodules bilaterally, nontender, mobile with swallowing; asymmetric on extension; no palpable anterior or posterior cervical lymphadenopathy; no supraclavicular masses; no tenderness CHEST: clear to auscultation bilaterally without rales, rhonchi, or wheezes CARDIAC: regular rate and rhythm without significant murmur; peripheral pulses are full EXT:  non-tender without edema; no deformity NEURO: no gross focal deficits; no sign of tremor   IMPRESSION: #1 elevated intact parathyroid hormone level of undetermined significance, no evidence of primary hyperparathyroidism #2 bilateral dominant thyroid nodules, multinodular goiter, nontoxic  PLAN: I reviewed the above studies at length with the patient and her husband. I provided them with written copies of the results.  We will repeat the intact PTH level and calcium level at a different laboratory. As the community, we are experiencing difficulty with the PTH determination.  Patient will undergo fine needle aspiration biopsy  of bilateral thyroid nodules with ultrasound guidance.  Patient will return in 3-4 weeks for review of these results and repeat examination of the neck.  Velora Heckler, MD, [facility] Office: [phone]

## 2011-12-24 NOTE — Patient Instructions (Signed)
Will schedule bilateral thyroid nodule biopsy at [facility].  Repeat PTH level and calcium level at [facility].  Velora Heckler, MD, [facility] Office: [phone]

## 2011-12-25 LAB — PTH, INTACT AND CALCIUM: Calcium: 9.5 mg/dL (ref 8.6–10.2)

## 2011-12-31 ENCOUNTER — Other Ambulatory Visit: Payer: Federal, State, Local not specified - PPO

## 2012-01-01 ENCOUNTER — Ambulatory Visit
Admission: RE | Admit: 2012-01-01 | Discharge: 2012-01-01 | Disposition: A | Discharge status: Home / Self Care | Payer: Federal, State, Local not specified - PPO | Source: Ambulatory Visit | Attending: Surgery | Admitting: Surgery

## 2012-01-01 ENCOUNTER — Other Ambulatory Visit (HOSPITAL_COMMUNITY)
Admission: RE | Admit: 2012-01-01 | Discharge: 2012-01-01 | Disposition: A | Discharge status: Home / Self Care | Payer: Federal, State, Local not specified - PPO | Source: Ambulatory Visit | Attending: Interventional Radiology | Admitting: Interventional Radiology

## 2012-01-01 DIAGNOSIS — E049 Nontoxic goiter, unspecified: Secondary | ICD-10-CM | POA: Insufficient documentation

## 2012-01-01 DIAGNOSIS — E042 Nontoxic multinodular goiter: Secondary | ICD-10-CM

## 2012-01-01 DIAGNOSIS — N200 Calculus of kidney: Secondary | ICD-10-CM

## 2012-01-03 ENCOUNTER — Telehealth (INDEPENDENT_AMBULATORY_CARE_PROVIDER_SITE_OTHER): Payer: Self-pay

## 2012-01-03 NOTE — Telephone Encounter (Signed)
Pt notified of path result per Dr Gerkin's request. 

## 2012-01-03 NOTE — Progress Notes (Signed)
Quick Note:  Please contact patient and notify of benign pathology results.  Frances Gray M. Sevannah Madia, MD, FACS [facility] Office: [phone]   ______ 

## 2012-01-22 ENCOUNTER — Encounter (INDEPENDENT_AMBULATORY_CARE_PROVIDER_SITE_OTHER): Payer: Self-pay | Admitting: Surgery

## 2012-01-22 ENCOUNTER — Ambulatory Visit (INDEPENDENT_AMBULATORY_CARE_PROVIDER_SITE_OTHER): Payer: Federal, State, Local not specified - PPO | Admitting: Surgery

## 2012-01-22 VITALS — BP 104/82 | HR 65 | Temp 97.2°F | Ht 64.0 in | Wt 252.2 lb

## 2012-01-22 DIAGNOSIS — E042 Nontoxic multinodular goiter: Secondary | ICD-10-CM

## 2012-01-22 NOTE — Patient Instructions (Signed)
Thyroid Diseases Your thyroid is a butterfly-shaped gland in your neck. It is located just above your collarbone. It is one of your endocrine glands, which make hormones. The thyroid helps set your metabolism. Metabolism is how your body gets energy from the foods you eat.  Millions of people have thyroid diseases. Women experience thyroid problems more often than men. In fact, overactive thyroid problems (hyperthyroidism) occur in 1% of all women. If you have a thyroid disease, your body may use energy more slowly or quickly than it should.  Thyroid problems also include an immune disease where your body reacts against your thyroid gland (called thyroiditis). A different problem involves lumps and bumps (called nodules) that develop in the gland. The nodules are usually, but not always, noncancerous. THE MOST COMMON THYROID PROBLEMS AND CAUSES ARE DISCUSSED BELOW There are many causes for thyroid problems. Treatment depends upon the exact diagnosis and includes trying to reset your body's metabolism to a normal rate. Hyperthyroidism Too much thyroid hormone from an overactive thyroid gland is called hyperthyroidism. In hyperthyroidism, the body's metabolism speeds up. One of the most frequent forms of hyperthyroidism is known as Graves' disease. Graves' disease tends to run in families. Although Graves' is thought to be caused by a problem with the immune system, the exact nature of the genetic problem is unknown. Hypothyroidism Too little thyroid hormone from an underactive thyroid gland is called hypothyroidism. In hypothyroidism, the body's metabolism is slowed. Several things can cause this condition. Most causes affect the thyroid gland directly and hurt its ability to make enough hormone.  Rarely, there may be a pituitary gland tumor (located near the base of the brain). The tumor can block the pituitary from producing thyroid-stimulating hormone (TSH). Your body makes TSH to stimulate the thyroid  to work properly. If the pituitary does not make enough TSH, the thyroid fails to make enough hormones needed for good health. Whether the problem is caused by thyroid conditions or by the pituitary gland, the result is that the thyroid is not making enough hormones. Hypothyroidism causes many physical and mental processes to become sluggish. The body consumes less oxygen and produces less body heat. Thyroid Nodules A thyroid nodule is a small swelling or lump in the thyroid gland. They are common. These nodules represent either a growth of thyroid tissue or a fluid-filled cyst. Both form a lump in the thyroid gland. Almost half of all people will have tiny thyroid nodules at some point in their lives. Typically, these are not noticeable until they become large and affect normal thyroid size. Larger nodules that are greater than a half inch across (about 1 centimeter) occur in about 5 percent of people. Although most nodules are not cancerous, people who have them should seek medical care to rule out cancer. Also, some thyroid nodules may produce too much thyroid hormone or become too large. Large nodules or a large gland can interfere with breathing or swallowing or may cause neck discomfort. Other problems Other thyroid problems include cancer and thyroiditis. Thyroiditis is a malfunction of the body's immune system. Normally, the immune system works to defend the body against infection and other problems. When the immune system is not working properly, it may mistakenly attack normal cells, tissues, and organs. Examples of autoimmune diseases are Hashimoto's thyroiditis (which causes low thyroid function) and Graves' disease (which causes excess thyroid function). SYMPTOMS  Symptoms vary greatly depending upon the exact type of problem with the thyroid. Hyperthyroidism-is when your thyroid is too   active and makes more thyroid hormone than your body needs. The most common cause is Graves' Disease. Too  much thyroid hormone can cause some or all of the following symptoms:  Anxiety.   Irritability.   Difficulty sleeping.   Fatigue.   A rapid or irregular heartbeat.   A fine tremor of your hands or fingers.   An increase in perspiration.   Sensitivity to heat.   Weight loss, despite normal food intake.   Brittle hair.   Enlargement of your thyroid gland (goiter).   Light menstrual periods.   Frequent bowel movements.  Graves' disease can specifically cause eye and skin problems. The skin problems involve reddening and swelling of the skin, often on your shins and on the top of your feet. Eye problems can include the following:  Excess tearing and sensation of grit or sand in either or both eyes.   Reddened or inflamed eyes.   Widening of the space between your eyelids.   Swelling of the lids and tissues around the eyes.   Light sensitivity.   Ulcers on the cornea.   Double vision.   Limited eye movements.   Blurred or reduced vision.  Hypothyroidism- is when your thyroid gland is not active enough. This is more common than hyperthyroidism. Symptoms can vary a lot depending of the severity of the hormone deficiency. Symptoms may develop over a long period of time and can include several of the following:  Fatigue.   Sluggishness.   Increased sensitivity to cold.   Constipation.   Pale, dry skin.   A puffy face.   Hoarse voice.   High blood cholesterol level.   Unexplained weight gain.   Muscle aches, tenderness and stiffness.   Pain, stiffness or swelling in your joints.   Muscle weakness.   Heavier than normal menstrual periods.   Brittle fingernails and hair.   Depression.  Thyroid Nodules - most do not cause signs or symptoms. Occasionally, some may become so large that you can feel or even see the swelling at the base of your neck. You may realize a lump or swelling is there when you are shaving or putting on makeup. Men might become  aware of a nodule when shirt collars suddenly feel too tight. Some nodules produce too much thyroid hormone. This can produce the same symptoms as hyperthyroidism (see above). Thyroid nodules are seldom cancerous. However, a nodule is more likely to be malignant (cancerous) if it:  Grows quickly or feels hard.   Causes you to become hoarse or to have trouble swallowing or breathing.   Causes enlarged lymph nodes under your jaw or in your neck.  DIAGNOSIS  Because there are so many possible thyroid conditions, your caregiver may ask for a number of tests. They will do this in order to narrow down the exact diagnosis. These tests can include:  Blood and antibody tests.   Special thyroid scans using small, safe amounts of radioactive iodine.   Ultrasound of the thyroid gland (particularly if there is a nodule or lump).   Biopsy. This is usually done with a special needle. A needle biopsy is a procedure to obtain a sample of cells from the thyroid. The tissue will be tested in a lab and examined under a microscope.  TREATMENT  Treatment depends on the exact diagnosis. Hyperthyroidism  Beta-blockers help relieve many of the symptoms.   Anti-thyroid medications prevent the thyroid from making excess hormones.   Radioactive iodine treatment can destroy overactive thyroid   cells. The iodine can permanently decrease the amount of hormone produced.   Surgery to remove the thyroid gland.   Treatments for eye problems that come from Graves' disease also include medications and special eye surgery, if felt to be appropriate.  Hypothyroidism Thyroid replacement with levothyroxine is the mainstay of treatment. Treatment with thyroid replacement is usually lifelong and will require monitoring and adjustment from time to time. Thyroid Nodules  Watchful waiting. If a small nodule causes no symptoms or signs of cancer on biopsy, then no treatment may be chosen at first. Re-exam and re-checking blood  tests would be the recommended follow-up.   Anti-thyroid medications or radioactive iodine treatment may be recommended if the nodules produce too much thyroid hormone (see Treatment for Hyperthyroidism above).   Alcohol ablation. Injections of small amounts of ethyl alcohol (ethanol) can cause a non-cancerous nodule to shrink in size.   Surgery (see Treatment for Hyperthyroidism above).  HOME CARE INSTRUCTIONS   Take medications as instructed.   Follow through on recommended testing.  SEEK MEDICAL CARE IF:   You feel that you are developing symptoms of Hyperthyroidism or Hypothyroidism as described above.   You develop a new lump/nodule in the neck/thyroid area that you had not noticed before.   You feel that you are having side effects from medicines prescribed.   You develop trouble breathing or swallowing.  SEEK IMMEDIATE MEDICAL CARE IF:   You develop a fever of 102 F (38.9 C) or higher.   You develop severe sweating.   You develop palpitations and/or rapid heart beat.   You develop shortness of breath.   You develop nausea and vomiting.   You develop extreme shakiness.   You develop agitation.   You develop lightheadedness or have a fainting episode.  Document Released: 03/24/2007 Document Revised: 05/16/2011 Document Reviewed: 03/24/2007 [employer] Patient Information 2012 [employer]. 

## 2012-01-22 NOTE — Progress Notes (Signed)
General Surgery [facility]  Visit Diagnoses: 1. Multinodular goiter (nontoxic)     HISTORY: Patient is a 61 year old female who returns today at my request after undergoing further diagnostic testing. Repeat laboratory studies show a normal intact PTH level of 57 and a normal serum calcium level of 9.5.  Patient also underwent bilateral fine-needle aspiration biopsies of thyroid nodules. 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 There was no evidence of malignancy.  PERTINENT REVIEW OF SYSTEMS: Patient is doing well. No ecchymosis. No hematoma. No voice changes. No neck pain.  EXAM: HEENT: normocephalic; pupils equal and reactive; sclerae clear; dentition good; mucous membranes moist NECK:  Dominant thyroid nodule in the isthmus measuring approximately 2.5 cm, mobile, nontender; dominant nodule right thyroid lobe, mobile, nontender; asymmetric on extension; no palpable anterior or posterior cervical lymphadenopathy; no supraclavicular masses; no tenderness CHEST: clear to auscultation bilaterally without rales, rhonchi, or wheezes CARDIAC: regular rate and rhythm without significant murmur; peripheral pulses are full EXT:  non-tender without edema; no deformity NEURO: no gross focal deficits; no sign of tremor   IMPRESSION: #1 no biochemical or radiographic evidence of parathyroid disease #2 multinodular thyroid goiter with underlying thyroiditis, no evidence of malignancy  PLAN: I discussed the above findings at length with the patient and her husband. I have recommended followup in one year with a repeat thyroid ultrasound followed by physical examination here in the office. They are happy with her results and are in agreement with this plan.  I will see her back in the office in one year.  Velora Heckler, MD, [facility] Office: [phone]

## 2012-02-12 ENCOUNTER — Encounter: Payer: Self-pay | Admitting: Internal Medicine

## 2012-02-12 ENCOUNTER — Ambulatory Visit (INDEPENDENT_AMBULATORY_CARE_PROVIDER_SITE_OTHER): Payer: Federal, State, Local not specified - PPO | Admitting: Internal Medicine

## 2012-02-12 ENCOUNTER — Other Ambulatory Visit (INDEPENDENT_AMBULATORY_CARE_PROVIDER_SITE_OTHER): Payer: Federal, State, Local not specified - PPO

## 2012-02-12 VITALS — BP 118/72 | HR 60 | Temp 97.9°F | Ht 63.0 in | Wt 251.4 lb

## 2012-02-12 DIAGNOSIS — I10 Essential (primary) hypertension: Secondary | ICD-10-CM

## 2012-02-12 DIAGNOSIS — H539 Unspecified visual disturbance: Secondary | ICD-10-CM

## 2012-02-12 DIAGNOSIS — Z0000 Encounter for general adult medical examination without abnormal findings: Secondary | ICD-10-CM

## 2012-02-12 DIAGNOSIS — E042 Nontoxic multinodular goiter: Secondary | ICD-10-CM

## 2012-02-12 DIAGNOSIS — M1710 Unilateral primary osteoarthritis, unspecified knee: Secondary | ICD-10-CM

## 2012-02-12 LAB — HEPATIC FUNCTION PANEL
AST: 18 U/L (ref 0–37)
Alkaline Phosphatase: 63 U/L (ref 39–117)
Bilirubin, Direct: 0.1 mg/dL (ref 0.0–0.3)
Total Protein: 7.8 g/dL (ref 6.0–8.3)

## 2012-02-12 LAB — CBC WITH DIFFERENTIAL/PLATELET
Basophils Absolute: 0.1 10*3/uL (ref 0.0–0.1)
Eosinophils Absolute: 0.2 10*3/uL (ref 0.0–0.7)
MCHC: 33.1 g/dL (ref 30.0–36.0)
MCV: 83.4 fl (ref 78.0–100.0)
Monocytes Absolute: 0.6 10*3/uL (ref 0.1–1.0)
Neutrophils Relative %: 48.9 % (ref 43.0–77.0)
Platelets: 337 10*3/uL (ref 150.0–400.0)

## 2012-02-12 LAB — BASIC METABOLIC PANEL
BUN: 13 mg/dL (ref 6–23)
CO2: 28 mEq/L (ref 19–32)
Calcium: 9.5 mg/dL (ref 8.4–10.5)
Chloride: 102 mEq/L (ref 96–112)
Creatinine, Ser: 0.7 mg/dL (ref 0.4–1.2)

## 2012-02-12 LAB — URINALYSIS, ROUTINE W REFLEX MICROSCOPIC
Hgb urine dipstick: NEGATIVE
Leukocytes, UA: NEGATIVE
Nitrite: NEGATIVE
Urobilinogen, UA: 0.2 (ref 0.0–1.0)

## 2012-02-12 LAB — LIPID PANEL
Cholesterol: 192 mg/dL (ref 0–200)
LDL Cholesterol: 119 mg/dL — ABNORMAL HIGH (ref 0–99)
Total CHOL/HDL Ratio: 3

## 2012-02-12 NOTE — Patient Instructions (Signed)
It was good to see you today. Health Maintenance reviewed - all recommended immunizations and age-appropriate screenings are up-to-date. We have reviewed your prior records including labs and tests today we'll make referral to eye doctor. Our office will contact you regarding appointment(s) once made. Test(s) ordered today. Your results will be called to you after review (48-72hours after test completion). If any changes need to be made, you will be notified at that time. Medications reviewed, no changes at this time. Temporary handicap tag application signed and given to you today - Other tags and follow up for your knee pain should be with Dr Dion Saucier as we discussed Please schedule followup in 6-12 months, call sooner if problems.  Health Maintenance, Females A healthy lifestyle and preventative care can promote health and wellness.  Maintain regular health, dental, and eye exams.   Eat a healthy diet. Foods like vegetables, fruits, whole grains, low-fat dairy products, and lean protein foods contain the nutrients you need without too many calories. Decrease your intake of foods high in solid fats, added sugars, and salt. Get information about a proper diet from your caregiver, if necessary.   Regular physical exercise is one of the most important things you can do for your health. Most adults should get at least 150 minutes of moderate-intensity exercise (any activity that increases your heart rate and causes you to sweat) each week. In addition, most adults need muscle-strengthening exercises on 2 or more days a week.     Maintain a healthy weight. The body mass index (BMI) is a screening tool to identify possible weight problems. It provides an estimate of body fat based on height and weight. Your caregiver can help determine your BMI, and can help you achieve or maintain a healthy weight. For adults 20 years and older:   A BMI below 18.5 is considered underweight.   A BMI of 18.5 to 24.9  is normal.   A BMI of 25 to 29.9 is considered overweight.   A BMI of 30 and above is considered obese.   Maintain normal blood lipids and cholesterol by exercising and minimizing your intake of saturated fat. Eat a balanced diet with plenty of fruits and vegetables. Blood tests for lipids and cholesterol should begin at age 55 and be repeated every 5 years. If your lipid or cholesterol levels are high, you are over 50, or you are a high risk for heart disease, you may need your cholesterol levels checked more frequently. Ongoing high lipid and cholesterol levels should be treated with medicines if diet and exercise are not effective.   If you smoke, find out from your caregiver how to quit. If you do not use tobacco, do not start.   If you are pregnant, do not drink alcohol. If you are breastfeeding, be very cautious about drinking alcohol. If you are not pregnant and choose to drink alcohol, do not exceed 1 drink per day. One drink is considered to be 12 ounces (355 mL) of beer, 5 ounces (148 mL) of wine, or 1.5 ounces (44 mL) of liquor.   Avoid use of street drugs. Do not share needles with anyone. Ask for help if you need support or instructions about stopping the use of drugs.   High blood pressure causes heart disease and increases the risk of stroke. Blood pressure should be checked at least every 1 to 2 years. Ongoing high blood pressure should be treated with medicines, if weight loss and exercise are not effective.  If you are 41 to 62 years old, ask your caregiver if you should take aspirin to prevent strokes.   Diabetes screening involves taking a blood sample to check your fasting blood sugar level. This should be done once every 3 years, after age 45, if you are within normal weight and without risk factors for diabetes. Testing should be considered at a younger age or be carried out more frequently if you are overweight and have at least 1 risk factor for diabetes.   Breast cancer  screening is essential preventative care for women. You should practice "breast self-awareness." This means understanding the normal appearance and feel of your breasts and may include breast self-examination. Any changes detected, no matter how small, should be reported to a caregiver. Women in their 72s and 30s should have a clinical breast exam (CBE) by a caregiver as part of a regular health exam every 1 to 3 years. After age 64, women should have a CBE every year. Starting at age 81, women should consider having a mammogram (breast X-ray) every year. Women who have a family history of breast cancer should talk to their caregiver about genetic screening. Women at a high risk of breast cancer should talk to their caregiver about having an MRI and a mammogram every year.   The Pap test is a screening test for cervical cancer. Women should have a Pap test starting at age 79. Between ages 51 and 43, Pap tests should be repeated every 2 years. Beginning at age 45, you should have a Pap test every 3 years as long as the past 3 Pap tests have been normal. If you had a hysterectomy for a problem that was not cancer or a condition that could lead to cancer, then you no longer need Pap tests. If you are between ages 91 and 81, and you have had normal Pap tests going back 10 years, you no longer need Pap tests. If you have had past treatment for cervical cancer or a condition that could lead to cancer, you need Pap tests and screening for cancer for at least 20 years after your treatment. If Pap tests have been discontinued, risk factors (such as a new sexual partner) need to be reassessed to determine if screening should be resumed. Some women have medical problems that increase the chance of getting cervical cancer. In these cases, your caregiver may recommend more frequent screening and Pap tests.   The human papillomavirus (HPV) test is an additional test that may be used for cervical cancer screening. The HPV test  looks for the virus that can cause the cell changes on the cervix. The cells collected during the Pap test can be tested for HPV. The HPV test could be used to screen women aged 69 years and older, and should be used in women of any age who have unclear Pap test results. After the age of 72, women should have HPV testing at the same frequency as a Pap test.   Colorectal cancer can be detected and often prevented. Most routine colorectal cancer screening begins at the age of 55 and continues through age 33. However, your caregiver may recommend screening at an earlier age if you have risk factors for colon cancer. On a yearly basis, your caregiver may provide home test kits to check for hidden blood in the stool. Use of a small camera at the end of a tube, to directly examine the colon (sigmoidoscopy or colonoscopy), can detect the earliest forms of  colorectal cancer. Talk to your caregiver about this at age 71, when routine screening begins. Direct examination of the colon should be repeated every 5 to 10 years through age 41, unless early forms of pre-cancerous polyps or small growths are found.   Hepatitis C blood testing is recommended for all people born from [birth year] through 1966 and any individual with known risks for hepatitis C.   Practice safe sex. Use condoms and avoid high-risk sexual practices to reduce the spread of sexually transmitted infections (STIs). Sexually active women aged 14 and younger should be checked for Chlamydia, which is a common sexually transmitted infection. Older women with new or multiple partners should also be tested for Chlamydia. Testing for other STIs is recommended if you are sexually active and at increased risk.   Osteoporosis is a disease in which the bones lose minerals and strength with aging. This can result in serious bone fractures. The risk of osteoporosis can be identified using a bone density scan. Women ages 4 and over and women at risk for fractures or  osteoporosis should discuss screening with their caregivers. Ask your caregiver whether you should be taking a calcium supplement or vitamin D to reduce the rate of osteoporosis.   Menopause can be associated with physical symptoms and risks. Hormone replacement therapy is available to decrease symptoms and risks. You should talk to your caregiver about whether hormone replacement therapy is right for you.   Use sunscreen with a sun protection factor (SPF) of 30 or greater. Apply sunscreen liberally and repeatedly throughout the day. You should seek shade when your shadow is shorter than you. Protect yourself by wearing long sleeves, pants, a wide-brimmed hat, and sunglasses year round, whenever you are outdoors.   Notify your caregiver of new moles or changes in moles, especially if there is a change in shape or color. Also notify your caregiver if a mole is larger than the size of a pencil eraser.   Stay current with your immunizations.  Document Released: 12/10/2010 Document Revised: 05/16/2011 Document Reviewed: 12/10/2010 [facility] Patient Information 2012 [place], [place].Exercise to Lose Weight Exercise and a healthy diet may help you lose weight. Your doctor may suggest specific exercises. EXERCISE IDEAS AND TIPS  Choose low-cost things you enjoy doing, such as walking, bicycling, or exercising to workout videos.   Take stairs instead of the elevator.   Walk during your lunch break.   Park your car further away from work or school.   Go to a gym or an exercise class.   Start with 5 to 10 minutes of exercise each day. Build up to 30 minutes of exercise 4 to 6 days a week.   Wear shoes with good support and comfortable clothes.   Stretch before and after working out.   Work out until you breathe harder and your heart beats faster.   Drink extra water when you exercise.   Do not do so much that you hurt yourself, feel dizzy, or get very short of breath.  Exercises that burn  about 150 calories:  Running 1  [place] in 15 minutes.   Playing volleyball for 45 to 60 minutes.   Washing and waxing a car for 45 to 60 minutes.   Playing touch football for 45 minutes.   Walking 1  [place] in 35 minutes.   Pushing a stroller 1  [place] in 30 minutes.   Playing basketball for 30 minutes.   Raking leaves for 30 minutes.   Bicycling 5 [place]  in 30 minutes.   Walking 2 [place] in 30 minutes.   Dancing for 30 minutes.   Shoveling snow for 15 minutes.   Swimming laps for 20 minutes.   Walking up stairs for 15 minutes.   Bicycling 4 [place] in 15 minutes.   Gardening for 30 to 45 minutes.   Jumping rope for 15 minutes.   Washing windows or floors for 45 to 60 minutes.  Document Released: 06/29/2010 Document Revised: 05/16/2011 Document Reviewed: 06/29/2010 [facility] Patient Information 2012 [place], [place].

## 2012-02-12 NOTE — Progress Notes (Signed)
Subjective:    Patient ID: Frances Gray, female    DOB: Jul 15, 1950, 61 y.o.   MRN: [id number]  HPI  patient is here today for annual physical. Patient feels well overall.  Also reviewed chronic medical issues today  HTN - the patient reports compliance with medication(s) as prescribed. Denies adverse side effects. changed Lotrel to generic ARB/hct 06/2011 due to $ concerns - no chest pain, headache or edema  Kidney stones - recurrent events summer-fall 2012 since moving to [place]. S/p lithotripsy for R ureter tone 08/2011 - imporved, following with uro prn, no recurrence since 3/13  osteoarthritis - R>L pain, knee scope in [place] for same 2006 - takes OTC aleve 2-3x/week for control of pain symptoms - no swelling but previously told needs partial knee replacement - est with local ortho 07/2011 ([place]) - ?temp handicap tag  Thyroiditis - Hashimoto's type on bx 12/2011 for eval of multinodular goiter ([facility])  Past Medical History  Diagnosis Date  . Arthritis     R knee  . GERD (gastroesophageal reflux disease)   . Allergic rhinitis, seasonal   . Hypertension   . History of chicken pox   . Diverticulitis   . Emphysema   . Headaches, cluster   . History of colon polyps   . Heart murmur     no symptoms  . Anemia     has had anemia in the past. none now  . 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 Vitamin D deficiency disease 08/14/2011  . Goiter, nodular     01/01/12 bx: Hashimoto's thyroiditis, no malignancy   Family History  Problem Relation Age of Onset  . Ovarian cancer Mother   . Cancer Mother     Ovarian Cancer  . Colon cancer Other   . Hypertension Other   . Hyperlipidemia Other   . Heart disease Other   . Breast cancer Other   . Cancer Sister     Breast & Colon Cancer    History  Substance Use Topics  . Smoking status: Former Games developer  . Smokeless tobacco: Not on file  . 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  Eyes: - no pin but increase reading trouble despite correction Respiratory: Negative for cough and shortness of breath.   Cardiovascular: Negative for chest pain or palpitations.  Gastrointestinal: Negative for abdominal pain, no bowel changes.  Musculoskeletal: Negative for gait problem or joint swelling.  Skin: Negative for rash.  Neurological: Negative for dizziness or headache.  No other specific complaints in a complete review of systems (except as listed in HPI above).      Objective:   Physical Exam  BP 118/72  Pulse 60  Temp 97.9 F (36.6 C) (Oral)  Ht 5\' 3"  (1.6 m)  Wt 251 lb 6.4 oz (114.034 kg)  BMI 44.53 kg/m2  SpO2 98% Wt Readings from Last 3 Encounters:  02/12/12 251 lb 6.4 oz (114.034 kg)  01/22/12 252 lb 3.2 oz (114.397 kg)  12/24/11 254 lb 3.2 oz (115.304 kg)   Constitutional: She is obese but appears well-developed and well-nourished. No distress.  HENT: NCAT, TMs clear, OP clear Eyes: PERRL, EOMI - no icterus. Vision grossly intact with correction Neck: Normal range of motion. Neck supple. No JVD present. No thyromegaly or tenderness present.  Cardiovascular: Normal rate,  regular rhythm and normal heart sounds.  No murmur heard. No BLE edema. Pulmonary/Chest: Effort normal and breath sounds normal. No respiratory distress. She has no wheezes.  Abd: SNTND, +BS Musculoskeletal: R>L knee - boggy synovitis - tender to palpation over joint line; FROM and ligamentous function intact - otherwise, normal range of motion, no joint effusions. No gross deformities Psychiatric: She has a normal mood and affect. Her behavior is normal. Judgment and thought content normal.   Lab Results  Component Value Date   WBC 5.8 08/14/2011   HGB 12.5 08/14/2011   HCT 37.6 08/14/2011   PLT 323.0 08/14/2011   TSH 2.724 11/28/2011       Assessment &  Plan:  CPX/v70.0 - Patient has been counseled on age-appropriate routine health concerns for screening and prevention. These are reviewed and up-to-date. Immunizations are up-to-date or declined. Labs ordered and reviewed.  Vision change/reading trouble despite correction - refer to optho  Also see problem list. Medications and labs reviewed today.

## 2012-02-12 NOTE — Assessment & Plan Note (Signed)
12/2101 bx - Hashimoto's thyroiditis, no malignancy Follow with [facility] annually and prn Lab Results  Component Value Date   TSH 2.724 11/28/2011

## 2012-02-12 NOTE — Assessment & Plan Note (Signed)
BP Readings from Last 3 Encounters:  02/12/12 118/72  01/22/12 104/82  12/24/11 143/98   Changed lotrel to max dose ARB+hctz  06/2011 due to $ Cough on ACEI noted The current medical regimen is effective;  continue present plan and medications.

## 2012-02-12 NOTE — Assessment & Plan Note (Signed)
Reviewed orthopedic records from [facility] - synvisc injection x 5 spring 2012 Following with local ortho ([facility]) pending need for R knee replacement  recommended tylenol as needed with aleve prn Temp handicap tag signed for same - additional follow up per ortho

## 2012-08-11 ENCOUNTER — Ambulatory Visit: Payer: Federal, State, Local not specified - PPO | Admitting: Internal Medicine

## 2012-08-12 ENCOUNTER — Encounter: Payer: Self-pay | Admitting: Internal Medicine

## 2012-08-12 ENCOUNTER — Ambulatory Visit (INDEPENDENT_AMBULATORY_CARE_PROVIDER_SITE_OTHER): Payer: Federal, State, Local not specified - PPO | Admitting: Internal Medicine

## 2012-08-12 ENCOUNTER — Other Ambulatory Visit: Payer: Federal, State, Local not specified - PPO

## 2012-08-12 VITALS — BP 120/80 | HR 61 | Temp 97.0°F | Wt 249.4 lb

## 2012-08-12 DIAGNOSIS — E559 Vitamin D deficiency, unspecified: Secondary | ICD-10-CM

## 2012-08-12 DIAGNOSIS — Z124 Encounter for screening for malignant neoplasm of cervix: Secondary | ICD-10-CM

## 2012-08-12 DIAGNOSIS — E042 Nontoxic multinodular goiter: Secondary | ICD-10-CM

## 2012-08-12 DIAGNOSIS — I10 Essential (primary) hypertension: Secondary | ICD-10-CM

## 2012-08-12 DIAGNOSIS — Z23 Encounter for immunization: Secondary | ICD-10-CM

## 2012-08-12 DIAGNOSIS — Z1239 Encounter for other screening for malignant neoplasm of breast: Secondary | ICD-10-CM

## 2012-08-12 NOTE — Assessment & Plan Note (Signed)
On OTC meds - recheck same now at pt request denies osteopenia or other bone problems but check DEXA

## 2012-08-12 NOTE — Progress Notes (Signed)
Subjective:    Patient ID: Frances Gray, female    DOB: 10/07/1950, 62 y.o.   MRN: [id number]  HPI  patient is here today for follow up - reviewed chronic medical issues today  HTN - the patient reports compliance with medication(s) as prescribed. Denies adverse side effects. changed Lotrel to generic ARB/hct 06/2011 due to $ concerns - no chest pain, headache or edema  Kidney stones - recurrent events summer-fall 2012 since moving to [place]. S/p lithotripsy for R ureter tone 08/2011 - imporved, following with uro prn, no recurrence since 3/13  osteoarthritis - R>L pain, knee scope in [place] for same 2006 - takes OTC aleve 2-3x/week for control of pain symptoms - no swelling but previously told needs partial knee replacement - est with local ortho 07/2011 ([facility]) - ?temp handicap tag  Thyroiditis - Hashimoto's type on bx 12/2011 for eval of multinodular goiter ([facility])  Past Medical History  Diagnosis Date  . Arthritis     R knee  . GERD (gastroesophageal reflux disease)   . Allergic rhinitis, seasonal   . Hypertension   . History of chicken pox   . Diverticulitis   . Emphysema   . Headaches, cluster   . History of colon polyps   . Heart murmur     no symptoms  . Anemia     has had anemia in the past. none now  . 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 Vitamin D deficiency disease 08/14/2011  . Goiter, nodular     01/01/12 bx: Hashimoto's thyroiditis, no malignancy    History  Substance Use Topics  . Smoking status: Former Games developer  . Smokeless tobacco: Not on file  . 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  Respiratory: Negative for cough and shortness of breath.   Cardiovascular: Negative for chest pain or palpitations.       Objective:   Physical Exam  BP 120/80  Pulse 61   Temp(Src) 97 F (36.1 C) (Oral)  Wt 249 lb 6.4 oz (113.127 kg)  BMI 44.19 kg/m2  SpO2 97% Wt Readings from Last 3 Encounters:  08/12/12 249 lb 6.4 oz (113.127 kg)  02/12/12 251 lb 6.4 oz (114.034 kg)  01/22/12 252 lb 3.2 oz (114.397 kg)   Constitutional: She is obese but appears well-developed and well-nourished. No distress.  Neck: Normal range of motion. Neck supple. No JVD present. No thyromegaly or tenderness present.  Cardiovascular: Normal rate, regular rhythm and normal heart sounds.  No murmur heard. No BLE edema. Pulmonary/Chest: Effort normal and breath sounds normal. No respiratory distress. She has no wheezes.  Musculoskeletal: R>L knee - boggy synovitis - tender to palpation over joint line; FROM and ligamentous function intact - otherwise, normal range of motion, no joint effusions. No gross deformities Psychiatric: She has a normal mood and affect. Her behavior is normal. Judgment and thought content normal.   Lab Results  Component Value Date   WBC 4.8 02/12/2012   HGB 12.9 02/12/2012   HCT 38.8 02/12/2012   PLT 337.0 02/12/2012   GLUCOSE 94 02/12/2012   CHOL 192 02/12/2012   TRIG 91.0 02/12/2012   HDL 55.10 02/12/2012   LDLCALC 119* 02/12/2012   ALT 17 02/12/2012   AST 18 02/12/2012   NA 137 02/12/2012   K 4.2 02/12/2012  CL 102 02/12/2012   CREATININE 0.7 02/12/2012   BUN 13 02/12/2012   CO2 28 02/12/2012   TSH 2.70 02/12/2012       Assessment & Plan:   see problem list. Medications and labs reviewed today.

## 2012-08-12 NOTE — Assessment & Plan Note (Addendum)
12/2011 bx - Hashimoto's thyroiditis, no malignancy Follows with [facility] annually and prn Lab Results  Component Value Date   TSH 2.70 02/12/2012

## 2012-08-12 NOTE — Assessment & Plan Note (Signed)
BP Readings from Last 3 Encounters:  08/12/12 120/80  02/12/12 118/72  01/22/12 104/82   Changed lotrel to max dose ARB+hctz  06/2011 due to $ Cough on ACEI noted The current medical regimen is effective;  continue present plan and medications.

## 2012-08-12 NOTE — Patient Instructions (Signed)
It was good to see you today. We have reviewed your prior records including labs and tests today we'll make referral to gynecologist and for mammogram screening. Our office will contact you regarding appointment(s) once made. Test(s) ordered today. Your results will be called to you after review (48-72hours after test completion). If any changes need to be made, you will be notified at that time. Medications reviewed and updated, no changes at this time. Please schedule followup in 6-12 months, call sooner if problems.  Exercise to Lose Weight Exercise and a healthy diet may help you lose weight. Your doctor may suggest specific exercises. EXERCISE IDEAS AND TIPS  Choose low-cost things you enjoy doing, such as walking, bicycling, or exercising to workout videos.   Take stairs instead of the elevator.   Walk during your lunch break.   Park your car further away from work or school.   Go to a gym or an exercise class.   Start with 5 to 10 minutes of exercise each day. Build up to 30 minutes of exercise 4 to 6 days a week.   Wear shoes with good support and comfortable clothes.   Stretch before and after working out.   Work out until you breathe harder and your heart beats faster.   Drink extra water when you exercise.   Do not do so much that you hurt yourself, feel dizzy, or get very short of breath.  Exercises that burn about 150 calories:  Running 1  [place] in 15 minutes.   Playing volleyball for 45 to 60 minutes.   Washing and waxing a car for 45 to 60 minutes.   Playing touch football for 45 minutes.   Walking 1  [place] in 35 minutes.   Pushing a stroller 1  [place] in 30 minutes.   Playing basketball for 30 minutes.   Raking leaves for 30 minutes.   Bicycling 5 [place] in 30 minutes.   Walking 2 [place] in 30 minutes.   Dancing for 30 minutes.   Shoveling snow for 15 minutes.   Swimming laps for 20 minutes.   Walking up stairs for 15 minutes.   Bicycling  4 [place] in 15 minutes.   Gardening for 30 to 45 minutes.   Jumping rope for 15 minutes.   Washing windows or floors for 45 to 60 minutes.  Document Released: 06/29/2010 Document Revised: 05/16/2011 Document Reviewed: 06/29/2010 [facility] Patient Information 2012 [place], [place].

## 2012-09-03 LAB — HM MAMMOGRAPHY

## 2012-09-15 ENCOUNTER — Telehealth: Payer: Self-pay | Admitting: *Deleted

## 2012-09-15 ENCOUNTER — Encounter: Payer: Self-pay | Admitting: Internal Medicine

## 2012-09-15 NOTE — Telephone Encounter (Signed)
Left msg on vm requesting call abck. Called pt back she stated md referred her to have a mammogram & OB/GYN. Saw Dr. Seymour Bars @ [facility] she also ordered a mammogam. Pt states she had test done yesterday & wanted to know has Dr. Felicity Coyer receive copy. Inform her md hasn't received copy. Gave her out fax # that way they can fax report to md. She will cx the appt that was set up at breast center....Raechel Chute

## 2012-09-16 ENCOUNTER — Ambulatory Visit: Payer: Federal, State, Local not specified - PPO

## 2012-10-08 ENCOUNTER — Telehealth: Payer: Self-pay | Admitting: Internal Medicine

## 2012-10-08 NOTE — Telephone Encounter (Signed)
rec'd from Delbert Harness forward 3 pages to [facility] 10/08/12

## 2012-10-14 ENCOUNTER — Encounter (HOSPITAL_COMMUNITY): Payer: Self-pay | Admitting: Pharmacist

## 2012-10-23 ENCOUNTER — Other Ambulatory Visit: Payer: Self-pay | Admitting: Orthopedic Surgery

## 2012-11-10 ENCOUNTER — Encounter (HOSPITAL_COMMUNITY)
Admission: RE | Admit: 2012-11-10 | Discharge: 2012-11-10 | Disposition: A | Discharge status: Home / Self Care | Payer: Federal, State, Local not specified - PPO | Source: Ambulatory Visit | Attending: Orthopedic Surgery | Admitting: Orthopedic Surgery

## 2012-11-10 ENCOUNTER — Other Ambulatory Visit: Payer: Self-pay | Admitting: Internal Medicine

## 2012-11-10 ENCOUNTER — Encounter (HOSPITAL_COMMUNITY)
Admission: RE | Admit: 2012-11-10 | Discharge: 2012-11-10 | Disposition: A | Discharge status: Home / Self Care | Payer: Federal, State, Local not specified - PPO | Source: Ambulatory Visit | Attending: Anesthesiology | Admitting: Anesthesiology

## 2012-11-10 ENCOUNTER — Encounter (HOSPITAL_COMMUNITY): Payer: Self-pay

## 2012-11-10 DIAGNOSIS — Z01818 Encounter for other preprocedural examination: Secondary | ICD-10-CM | POA: Insufficient documentation

## 2012-11-10 DIAGNOSIS — Z01811 Encounter for preprocedural respiratory examination: Secondary | ICD-10-CM | POA: Insufficient documentation

## 2012-11-10 DIAGNOSIS — Z01810 Encounter for preprocedural cardiovascular examination: Secondary | ICD-10-CM | POA: Insufficient documentation

## 2012-11-10 DIAGNOSIS — Z01812 Encounter for preprocedural laboratory examination: Secondary | ICD-10-CM | POA: Insufficient documentation

## 2012-11-10 LAB — CBC
Hemoglobin: 12.6 g/dL (ref 12.0–15.0)
MCH: 27.8 pg (ref 26.0–34.0)
RBC: 4.53 MIL/uL (ref 3.87–5.11)
WBC: 6.4 10*3/uL (ref 4.0–10.5)

## 2012-11-10 LAB — TYPE AND SCREEN
ABO/RH(D): O POS
Antibody Screen: NEGATIVE

## 2012-11-10 LAB — PROTIME-INR
INR: 1.04 (ref 0.00–1.49)
Prothrombin Time: 13.5 seconds (ref 11.6–15.2)

## 2012-11-10 LAB — BASIC METABOLIC PANEL
GFR calc Af Amer: >90 mL/min (ref >90)
GFR calc non Af Amer: >90 mL/min (ref >90)
Glucose, Bld: 86 mg/dL (ref 70–99)
Potassium: 3.3 mEq/L — ABNORMAL LOW (ref 3.5–5.1)
Sodium: 138 mEq/L (ref 135–145)

## 2012-11-10 NOTE — Pre-Procedure Instructions (Signed)
Frances Gray  11/10/2012   Your procedure is scheduled on:  Monday, June 9th  Report to [facility] at 1100 AM. Come to main entrance "A" and go to east elevators up to 3rd floor. Check in at short stay desk.  Call this number if you have problems the morning of surgery: [phone]   Remember:   Do not eat food or drink liquids after midnight.   Take these medicines the morning of surgery with A SIP OF WATER: None   Do not wear jewelry, make-up or nail polish.  Do not wear lotions, powders, or perfume, deodorant.  Do not shave 48 hours prior to surgery. Men may shave face and neck.  Do not bring valuables to the hospital.  [facility] is not responsible  for any belongings or valuables.  Contacts, dentures or bridgework may not be worn into surgery.  Leave suitcase in the car. After surgery it may be brought to your room.  For patients admitted to the hospital, checkout time is 11:00 AM the day of discharge.   Patients discharged the day of surgery will not be allowed to drive home.    Special Instructions: Shower using CHG 2 nights before surgery and the night before surgery.  If you shower the day of surgery use CHG.  Use special wash - you have one bottle of CHG for all showers.  You should use approximately 1/3 of the bottle for each shower.   Please read over the following fact sheets that you were given: Pain Booklet, Coughing and Deep Breathing, Blood Transfusion Information, MRSA Information and Surgical Site Infection Prevention

## 2012-11-10 NOTE — Progress Notes (Signed)
11/10/12 1207  OBSTRUCTIVE SLEEP APNEA  Have you ever been diagnosed with sleep apnea through a sleep study? No  Do you snore loudly (loud enough to be heard through closed doors)?  1  Do you often feel tired, fatigued, or sleepy during the daytime? 1  Has anyone observed you stop breathing during your sleep? 0  Do you have, or are you being treated for high blood pressure? 1  BMI more than 35 kg/m2? 1  Age over 63 years old? 1  Neck circumference greater than 40 cm/18 inches? 0  Gender: 0  Obstructive Sleep Apnea Score 5  Score 4 or greater  Results sent to PCP

## 2012-11-15 MED ORDER — CEFAZOLIN SODIUM-DEXTROSE 2-3 GM-% IV SOLR
2.0000 g | INTRAVENOUS | Status: AC
  Administered 2012-11-16: 2 g via INTRAVENOUS
  Filled 2012-11-15: qty 50

## 2012-11-16 ENCOUNTER — Inpatient Hospital Stay (HOSPITAL_COMMUNITY): Payer: Federal, State, Local not specified - PPO

## 2012-11-16 ENCOUNTER — Ambulatory Visit (HOSPITAL_COMMUNITY): Payer: Federal, State, Local not specified - PPO | Admitting: Anesthesiology

## 2012-11-16 ENCOUNTER — Encounter (HOSPITAL_COMMUNITY): Payer: Self-pay | Admitting: *Deleted

## 2012-11-16 ENCOUNTER — Inpatient Hospital Stay (HOSPITAL_COMMUNITY)
Admission: RE | Admit: 2012-11-16 | Discharge: 2012-11-20 | DRG: 209 | Disposition: A | Discharge status: Home Health | Payer: Federal, State, Local not specified - PPO | Source: Ambulatory Visit | Attending: Orthopedic Surgery | Admitting: Orthopedic Surgery

## 2012-11-16 ENCOUNTER — Encounter (HOSPITAL_COMMUNITY)
Admission: RE | Disposition: A | Discharge status: Home Health | Payer: Self-pay | Source: Ambulatory Visit | Attending: Orthopedic Surgery

## 2012-11-16 ENCOUNTER — Encounter (HOSPITAL_COMMUNITY): Payer: Self-pay | Admitting: Anesthesiology

## 2012-11-16 DIAGNOSIS — Z860100 Personal history of colon polyps, unspecified: Secondary | ICD-10-CM

## 2012-11-16 DIAGNOSIS — J309 Allergic rhinitis, unspecified: Secondary | ICD-10-CM | POA: Diagnosis present

## 2012-11-16 DIAGNOSIS — Z79899 Other long term (current) drug therapy: Secondary | ICD-10-CM

## 2012-11-16 DIAGNOSIS — M1712 Unilateral primary osteoarthritis, left knee: Secondary | ICD-10-CM | POA: Diagnosis present

## 2012-11-16 DIAGNOSIS — K219 Gastro-esophageal reflux disease without esophagitis: Secondary | ICD-10-CM | POA: Diagnosis present

## 2012-11-16 DIAGNOSIS — Z7901 Long term (current) use of anticoagulants: Secondary | ICD-10-CM

## 2012-11-16 DIAGNOSIS — D62 Acute posthemorrhagic anemia: Secondary | ICD-10-CM | POA: Diagnosis not present

## 2012-11-16 DIAGNOSIS — E063 Autoimmune thyroiditis: Secondary | ICD-10-CM | POA: Diagnosis present

## 2012-11-16 DIAGNOSIS — M1710 Unilateral primary osteoarthritis, unspecified knee: Principal | ICD-10-CM | POA: Diagnosis present

## 2012-11-16 DIAGNOSIS — E876 Hypokalemia: Secondary | ICD-10-CM | POA: Diagnosis present

## 2012-11-16 DIAGNOSIS — Z87891 Personal history of nicotine dependence: Secondary | ICD-10-CM

## 2012-11-16 DIAGNOSIS — Z6841 Body Mass Index (BMI) 40.0 and over, adult: Secondary | ICD-10-CM

## 2012-11-16 DIAGNOSIS — I10 Essential (primary) hypertension: Secondary | ICD-10-CM | POA: Diagnosis present

## 2012-11-16 DIAGNOSIS — E559 Vitamin D deficiency, unspecified: Secondary | ICD-10-CM | POA: Diagnosis present

## 2012-11-16 HISTORY — PX: TOTAL KNEE ARTHROPLASTY: SHX125

## 2012-11-16 LAB — CBC
HCT: 35.8 % — ABNORMAL LOW (ref 36.0–46.0)
MCHC: 32.7 g/dL (ref 30.0–36.0)
MCV: 83.1 fL (ref 78.0–100.0)
Platelets: 296 10*3/uL (ref 150–400)
RDW: 13.4 % (ref 11.5–15.5)

## 2012-11-16 SURGERY — ARTHROPLASTY, KNEE, TOTAL
Anesthesia: General | Site: Knee | Laterality: Left | Wound class: Clean

## 2012-11-16 MED ORDER — LOSARTAN POTASSIUM 50 MG PO TABS
100.0000 mg | ORAL_TABLET | Freq: Every day | ORAL | Status: DC
  Administered 2012-11-16 – 2012-11-20 (×5): 100 mg via ORAL
  Filled 2012-11-16 (×5): qty 2

## 2012-11-16 MED ORDER — ONDANSETRON HCL 4 MG/2ML IJ SOLN
INTRAMUSCULAR | Status: DC | PRN
  Administered 2012-11-16: 4 mg via INTRAVENOUS

## 2012-11-16 MED ORDER — MIDAZOLAM HCL 2 MG/2ML IJ SOLN
INTRAMUSCULAR | Status: AC
  Administered 2012-11-16: 2 mg
  Filled 2012-11-16: qty 2

## 2012-11-16 MED ORDER — FENTANYL CITRATE 0.05 MG/ML IJ SOLN
INTRAMUSCULAR | Status: DC | PRN
  Administered 2012-11-16 (×5): 50 ug via INTRAVENOUS
  Administered 2012-11-16: 200 ug via INTRAVENOUS
  Administered 2012-11-16 (×3): 50 ug via INTRAVENOUS

## 2012-11-16 MED ORDER — OXYCODONE HCL 5 MG PO TABS: 5.0000 mg | ORAL_TABLET | Freq: Once | ORAL | Status: DC | PRN

## 2012-11-16 MED ORDER — DIPHENHYDRAMINE HCL 12.5 MG/5ML PO ELIX: 12.5000 mg | ORAL_SOLUTION | ORAL | Status: DC | PRN

## 2012-11-16 MED ORDER — NEOSTIGMINE METHYLSULFATE 1 MG/ML IJ SOLN
INTRAMUSCULAR | Status: DC | PRN
  Administered 2012-11-16: 3 mg via INTRAVENOUS

## 2012-11-16 MED ORDER — FENTANYL CITRATE 0.05 MG/ML IJ SOLN
INTRAMUSCULAR | Status: AC
  Administered 2012-11-16: 100 ug
  Filled 2012-11-16: qty 2

## 2012-11-16 MED ORDER — METHOCARBAMOL 500 MG PO TABS
500.0000 mg | ORAL_TABLET | Freq: Four times a day (QID) | ORAL | Status: DC | PRN
  Administered 2012-11-16 – 2012-11-20 (×11): 500 mg via ORAL
  Filled 2012-11-16 (×11): qty 1

## 2012-11-16 MED ORDER — METHOCARBAMOL 100 MG/ML IJ SOLN
500.0000 mg | Freq: Four times a day (QID) | INTRAVENOUS | Status: DC | PRN
  Administered 2012-11-16: 500 mg via INTRAVENOUS
  Filled 2012-11-16: qty 5

## 2012-11-16 MED ORDER — METHOCARBAMOL 500 MG PO TABS: 500.0000 mg | ORAL_TABLET | Freq: Four times a day (QID) | ORAL | Status: DC

## 2012-11-16 MED ORDER — LACTATED RINGERS IV SOLN
INTRAVENOUS | Status: DC | PRN
  Administered 2012-11-16 (×3): via INTRAVENOUS

## 2012-11-16 MED ORDER — SENNA 8.6 MG PO TABS
1.0000 | ORAL_TABLET | Freq: Two times a day (BID) | ORAL | Status: DC
  Administered 2012-11-16 – 2012-11-20 (×8): 8.6 mg via ORAL
  Filled 2012-11-16 (×9): qty 1

## 2012-11-16 MED ORDER — DOCUSATE SODIUM 100 MG PO CAPS
100.0000 mg | ORAL_CAPSULE | Freq: Two times a day (BID) | ORAL | Status: DC
  Administered 2012-11-16 – 2012-11-20 (×8): 100 mg via ORAL
  Filled 2012-11-16 (×10): qty 1

## 2012-11-16 MED ORDER — ACETAMINOPHEN 650 MG RE SUPP: 650.0000 mg | Freq: Four times a day (QID) | RECTAL | Status: DC | PRN

## 2012-11-16 MED ORDER — OXYCODONE HCL 5 MG/5ML PO SOLN: 5.0000 mg | Freq: Once | ORAL | Status: DC | PRN

## 2012-11-16 MED ORDER — LACTATED RINGERS IV SOLN
INTRAVENOUS | Status: DC
  Administered 2012-11-16: 12:00:00 via INTRAVENOUS

## 2012-11-16 MED ORDER — OXYCODONE-ACETAMINOPHEN 10-325 MG PO TABS: 1.0000 | ORAL_TABLET | Freq: Four times a day (QID) | ORAL | Status: DC | PRN

## 2012-11-16 MED ORDER — HYDROCHLOROTHIAZIDE 25 MG PO TABS
25.0000 mg | ORAL_TABLET | Freq: Every day | ORAL | Status: DC
  Administered 2012-11-16 – 2012-11-20 (×5): 25 mg via ORAL
  Filled 2012-11-16 (×5): qty 1

## 2012-11-16 MED ORDER — ZOLPIDEM TARTRATE 5 MG PO TABS: 5.0000 mg | ORAL_TABLET | Freq: Every evening | ORAL | Status: DC | PRN

## 2012-11-16 MED ORDER — KETOROLAC TROMETHAMINE 15 MG/ML IJ SOLN
7.5000 mg | Freq: Four times a day (QID) | INTRAMUSCULAR | Status: AC
  Administered 2012-11-16 – 2012-11-17 (×4): 7.5 mg via INTRAVENOUS
  Filled 2012-11-16 (×4): qty 1

## 2012-11-16 MED ORDER — CEFAZOLIN SODIUM-DEXTROSE 2-3 GM-% IV SOLR
2.0000 g | Freq: Four times a day (QID) | INTRAVENOUS | Status: AC
  Administered 2012-11-16 – 2012-11-17 (×2): 2 g via INTRAVENOUS
  Filled 2012-11-16 (×2): qty 50

## 2012-11-16 MED ORDER — MENTHOL 3 MG MT LOZG: 1.0000 | LOZENGE | OROMUCOSAL | Status: DC | PRN

## 2012-11-16 MED ORDER — ACETAMINOPHEN 325 MG PO TABS
650.0000 mg | ORAL_TABLET | Freq: Four times a day (QID) | ORAL | Status: DC | PRN
  Filled 2012-11-16: qty 2

## 2012-11-16 MED ORDER — ROCURONIUM BROMIDE 100 MG/10ML IV SOLN
INTRAVENOUS | Status: DC | PRN
  Administered 2012-11-16: 50 mg via INTRAVENOUS

## 2012-11-16 MED ORDER — HYDROMORPHONE HCL PF 1 MG/ML IJ SOLN
INTRAMUSCULAR | Status: AC
  Filled 2012-11-16: qty 1

## 2012-11-16 MED ORDER — GLYCOPYRROLATE 0.2 MG/ML IJ SOLN
INTRAMUSCULAR | Status: DC | PRN
  Administered 2012-11-16: 0.6 mg via INTRAVENOUS

## 2012-11-16 MED ORDER — ENOXAPARIN SODIUM 40 MG/0.4ML ~~LOC~~ SOLN: 40.0000 mg | SUBCUTANEOUS | Status: DC

## 2012-11-16 MED ORDER — ALUM & MAG HYDROXIDE-SIMETH 200-200-20 MG/5ML PO SUSP: 30.0000 mL | ORAL | Status: DC | PRN

## 2012-11-16 MED ORDER — BISACODYL 10 MG RE SUPP: 10.0000 mg | Freq: Every day | RECTAL | Status: DC | PRN

## 2012-11-16 MED ORDER — LOSARTAN POTASSIUM-HCTZ 100-25 MG PO TABS: 1.0000 | ORAL_TABLET | Freq: Every day | ORAL | Status: DC

## 2012-11-16 MED ORDER — BUPIVACAINE-EPINEPHRINE PF 0.5-1:200000 % IJ SOLN
INTRAMUSCULAR | Status: DC | PRN
  Administered 2012-11-16: 30 mL

## 2012-11-16 MED ORDER — HYDROMORPHONE HCL PF 1 MG/ML IJ SOLN
0.2500 mg | INTRAMUSCULAR | Status: DC | PRN
  Administered 2012-11-16 (×4): 0.5 mg via INTRAVENOUS

## 2012-11-16 MED ORDER — POLYETHYLENE GLYCOL 3350 17 G PO PACK
17.0000 g | PACK | Freq: Every day | ORAL | Status: DC | PRN
  Administered 2012-11-18: 17 g via ORAL
  Filled 2012-11-16: qty 1

## 2012-11-16 MED ORDER — VITAMIN D3 50 MCG (2000 UT) PO TABS: 2000.0000 [IU] | ORAL_TABLET | Freq: Every morning | ORAL | Status: DC

## 2012-11-16 MED ORDER — METOCLOPRAMIDE HCL 10 MG PO TABS: 5.0000 mg | ORAL_TABLET | Freq: Three times a day (TID) | ORAL | Status: DC | PRN

## 2012-11-16 MED ORDER — SENNA-DOCUSATE SODIUM 8.6-50 MG PO TABS: 2.0000 | ORAL_TABLET | Freq: Every day | ORAL | Status: DC

## 2012-11-16 MED ORDER — PROPOFOL 10 MG/ML IV BOLUS
INTRAVENOUS | Status: DC | PRN
  Administered 2012-11-16: 200 mg via INTRAVENOUS

## 2012-11-16 MED ORDER — MEPERIDINE HCL 25 MG/ML IJ SOLN: 6.2500 mg | INTRAMUSCULAR | Status: DC | PRN

## 2012-11-16 MED ORDER — MIDAZOLAM HCL 2 MG/2ML IJ SOLN
INTRAMUSCULAR | Status: AC
  Filled 2012-11-16: qty 2

## 2012-11-16 MED ORDER — OXYCODONE HCL 5 MG PO TABS
5.0000 mg | ORAL_TABLET | ORAL | Status: DC | PRN
  Administered 2012-11-16: 5 mg via ORAL
  Administered 2012-11-17 – 2012-11-20 (×19): 10 mg via ORAL
  Filled 2012-11-16 (×5): qty 2
  Filled 2012-11-16: qty 1
  Filled 2012-11-16 (×14): qty 2

## 2012-11-16 MED ORDER — ONDANSETRON HCL 4 MG/2ML IJ SOLN: 4.0000 mg | Freq: Four times a day (QID) | INTRAMUSCULAR | Status: DC | PRN

## 2012-11-16 MED ORDER — ENOXAPARIN SODIUM 30 MG/0.3ML ~~LOC~~ SOLN
30.0000 mg | Freq: Two times a day (BID) | SUBCUTANEOUS | Status: DC
  Administered 2012-11-17 – 2012-11-20 (×7): 30 mg via SUBCUTANEOUS
  Filled 2012-11-16 (×9): qty 0.3

## 2012-11-16 MED ORDER — MIDAZOLAM HCL 2 MG/2ML IJ SOLN: 0.5000 mg | Freq: Once | INTRAMUSCULAR | Status: DC | PRN

## 2012-11-16 MED ORDER — LIDOCAINE HCL (CARDIAC) 20 MG/ML IV SOLN
INTRAVENOUS | Status: DC | PRN
  Administered 2012-11-16: 20 mg via INTRAVENOUS

## 2012-11-16 MED ORDER — PHENOL 1.4 % MT LIQD: 1.0000 | OROMUCOSAL | Status: DC | PRN

## 2012-11-16 MED ORDER — METOCLOPRAMIDE HCL 5 MG/ML IJ SOLN: 5.0000 mg | Freq: Three times a day (TID) | INTRAMUSCULAR | Status: DC | PRN

## 2012-11-16 MED ORDER — LABETALOL HCL 5 MG/ML IV SOLN
INTRAVENOUS | Status: DC | PRN
  Administered 2012-11-16: 5 mg via INTRAVENOUS

## 2012-11-16 MED ORDER — POTASSIUM CHLORIDE IN NACL 20-0.45 MEQ/L-% IV SOLN
INTRAVENOUS | Status: DC
  Administered 2012-11-17: 02:00:00 via INTRAVENOUS
  Filled 2012-11-16 (×9): qty 1000

## 2012-11-16 MED ORDER — ONDANSETRON HCL 4 MG PO TABS: 4.0000 mg | ORAL_TABLET | Freq: Four times a day (QID) | ORAL | Status: DC | PRN

## 2012-11-16 MED ORDER — HYDROMORPHONE HCL PF 1 MG/ML IJ SOLN
0.5000 mg | INTRAMUSCULAR | Status: DC | PRN
  Administered 2012-11-17 – 2012-11-19 (×3): 1 mg via INTRAVENOUS
  Filled 2012-11-16 (×4): qty 1

## 2012-11-16 MED ORDER — MAGNESIUM CITRATE PO SOLN
1.0000 | Freq: Once | ORAL | Status: AC | PRN
  Filled 2012-11-16: qty 296

## 2012-11-16 MED ORDER — BIOTIN 5 MG PO CAPS: 5.0000 mg | ORAL_CAPSULE | Freq: Every morning | ORAL | Status: DC

## 2012-11-16 MED ORDER — SODIUM CHLORIDE 0.9 % IR SOLN
Status: DC | PRN
  Administered 2012-11-16: 1000 mL

## 2012-11-16 MED ORDER — VITAMIN D3 25 MCG (1000 UNIT) PO TABS
2000.0000 [IU] | ORAL_TABLET | Freq: Every day | ORAL | Status: DC
  Administered 2012-11-17 – 2012-11-20 (×4): 2000 [IU] via ORAL
  Filled 2012-11-16 (×4): qty 2

## 2012-11-16 MED ORDER — PROMETHAZINE HCL 25 MG/ML IJ SOLN: 6.2500 mg | INTRAMUSCULAR | Status: DC | PRN

## 2012-11-16 MED FILL — Fentanyl Citrate Inj 0.05 MG/ML: INTRAMUSCULAR | Qty: 8 | Status: AC

## 2012-11-16 SURGICAL SUPPLY — 61 items
BANDAGE ELASTIC 6 VELCRO ST LF (GAUZE/BANDAGES/DRESSINGS) ×4 IMPLANT
BANDAGE ESMARK 6X9 LF (GAUZE/BANDAGES/DRESSINGS) ×1 IMPLANT
BENZOIN TINCTURE PRP APPL 2/3 (GAUZE/BANDAGES/DRESSINGS) ×2 IMPLANT
BLADE SAG 18X100X1.27 (BLADE) ×2 IMPLANT
BLADE SAW RECIP 87.9 MT (BLADE) ×2 IMPLANT
BLADE SAW SGTL 13X75X1.27 (BLADE) ×2 IMPLANT
BNDG ESMARK 6X9 LF (GAUZE/BANDAGES/DRESSINGS) ×2
BOOTCOVER CLEANROOM LRG (PROTECTIVE WEAR) ×4 IMPLANT
BOWL SMART MIX CTS (DISPOSABLE) ×2 IMPLANT
CAPT RP KNEE ×2 IMPLANT
CEMENT HV SMART SET (Cement) ×2 IMPLANT
CLOTH BEACON ORANGE TIMEOUT ST (SAFETY) ×2 IMPLANT
CLSR STERI-STRIP ANTIMIC 1/2X4 (GAUZE/BANDAGES/DRESSINGS) ×2 IMPLANT
COVER SURGICAL LIGHT HANDLE (MISCELLANEOUS) ×2 IMPLANT
CUFF TOURNIQUET SINGLE 34IN LL (TOURNIQUET CUFF) IMPLANT
DRAPE EXTREMITY T 121X128X90 (DRAPE) ×2 IMPLANT
DRAPE PROXIMA HALF (DRAPES) ×2 IMPLANT
DRAPE U-SHAPE 47X51 STRL (DRAPES) ×2 IMPLANT
DRSG PAD ABDOMINAL 8X10 ST (GAUZE/BANDAGES/DRESSINGS) ×2 IMPLANT
DURAPREP 26ML APPLICATOR (WOUND CARE) ×4 IMPLANT
ELECT CAUTERY BLADE 6.4 (BLADE) ×2 IMPLANT
ELECT REM PT RETURN 9FT ADLT (ELECTROSURGICAL) ×2
ELECTRODE REM PT RTRN 9FT ADLT (ELECTROSURGICAL) ×1 IMPLANT
FACESHIELD LNG OPTICON STERILE (SAFETY) ×2 IMPLANT
GLOVE BIO SURGEON STRL SZ7 (GLOVE) ×2 IMPLANT
GLOVE BIOGEL PI IND STRL 7.0 (GLOVE) ×1 IMPLANT
GLOVE BIOGEL PI INDICATOR 7.0 (GLOVE) ×1
GLOVE BIOGEL PI ORTHO PRO SZ8 (GLOVE) ×2
GLOVE ORTHO TXT STRL SZ7.5 (GLOVE) ×2 IMPLANT
GLOVE PI ORTHO PRO STRL SZ8 (GLOVE) ×2 IMPLANT
GLOVE SURG ORTHO 8.0 STRL STRW (GLOVE) ×2 IMPLANT
GOWN BRE IMP PREV XXLGXLNG (GOWN DISPOSABLE) ×2 IMPLANT
GOWN STRL REIN XL XLG (GOWN DISPOSABLE) ×4 IMPLANT
HANDPIECE INTERPULSE COAX TIP (DISPOSABLE) ×1
HOOD PEEL AWAY FACE SHEILD DIS (HOOD) ×4 IMPLANT
IMMOBILIZER KNEE 22 UNIV (SOFTGOODS) ×2 IMPLANT
KIT BASIN OR (CUSTOM PROCEDURE TRAY) ×2 IMPLANT
KIT ROOM TURNOVER OR (KITS) ×2 IMPLANT
MANIFOLD NEPTUNE II (INSTRUMENTS) ×2 IMPLANT
NS IRRIG 1000ML POUR BTL (IV SOLUTION) ×2 IMPLANT
PACK TOTAL JOINT (CUSTOM PROCEDURE TRAY) ×2 IMPLANT
PAD ARMBOARD 7.5X6 YLW CONV (MISCELLANEOUS) ×4 IMPLANT
PAD CAST 4YDX4 CTTN HI CHSV (CAST SUPPLIES) ×1 IMPLANT
PADDING CAST COTTON 4X4 STRL (CAST SUPPLIES) ×1
PADDING CAST COTTON 6X4 STRL (CAST SUPPLIES) ×2 IMPLANT
SET HNDPC FAN SPRY TIP SCT (DISPOSABLE) ×1 IMPLANT
SPONGE GAUZE 4X4 12PLY (GAUZE/BANDAGES/DRESSINGS) ×2 IMPLANT
STAPLER VISISTAT 35W (STAPLE) ×2 IMPLANT
STRIP CLOSURE SKIN 1/2X4 (GAUZE/BANDAGES/DRESSINGS) ×2 IMPLANT
SUCTION FRAZIER TIP 10 FR DISP (SUCTIONS) ×2 IMPLANT
SUT MNCRL AB 4-0 PS2 18 (SUTURE) ×2 IMPLANT
SUT VIC AB 0 CT1 27 (SUTURE) ×1
SUT VIC AB 0 CT1 27XBRD ANBCTR (SUTURE) ×1 IMPLANT
SUT VIC AB 2-0 CT1 27 (SUTURE) ×1
SUT VIC AB 2-0 CT1 TAPERPNT 27 (SUTURE) ×1 IMPLANT
SUT VIC AB 3-0 SH 8-18 (SUTURE) ×2 IMPLANT
SYR 30ML LL (SYRINGE) ×2 IMPLANT
TOWEL OR 17X24 6PK STRL BLUE (TOWEL DISPOSABLE) ×2 IMPLANT
TOWEL OR 17X26 10 PK STRL BLUE (TOWEL DISPOSABLE) ×2 IMPLANT
TRAY FOLEY CATH 14FR (SET/KITS/TRAYS/PACK) IMPLANT
WATER STERILE IRR 1000ML POUR (IV SOLUTION) ×4 IMPLANT

## 2012-11-16 NOTE — Anesthesia Procedure Notes (Addendum)
Anesthesia Regional Block:  Femoral nerve block  Pre-Anesthetic Checklist: ,, timeout performed, Correct Patient, Correct Site, Correct Laterality, Correct Procedure, Correct Position, site marked, Risks and benefits discussed,  Surgical consent,  Pre-op evaluation,  At surgeon's request and post-op pain management  Laterality: Left  Prep: chloraprep       Needles:  Injection technique: Single-shot  Needle Type: Stimulator Needle - 40     Needle Length: 4cm  Needle Gauge: 22 and 22 G    Additional Needles:  Procedures: nerve stimulator Femoral nerve block  Nerve Stimulator or Paresthesia:  Response: patella twitch, 0.45 mA, 0.1 ms,   Additional Responses:   Narrative:  Start time: 11/16/2012 12:50 PM End time: 11/16/2012 12:57 PM Injection made incrementally with aspirations every 5 mL.  Performed by: Personally  Anesthesiologist: Sandford Craze, MD  Additional Notes: Pt identified in Holding room.  Monitors applied. Working IV access confirmed. Sterile prep L groin.  #22ga PNS to patella twitch at 0.32mA threshold.  30cc 0.5% Bupivacaine with 1:200k epi injected incrementally after negative test dose.  Patient asymptomatic, VSS, no heme aspirated, tolerated well.  Sandford Craze, MD  Femoral nerve block Procedure Name: Intubation Date/Time: 11/16/2012 1:31 PM Performed by: Lovie Chol Pre-anesthesia Checklist: Patient identified, Emergency Drugs available, Suction available, Patient being monitored and Timeout performed Patient Re-evaluated:Patient Re-evaluated prior to inductionOxygen Delivery Method: Circle system utilized Preoxygenation: Pre-oxygenation with 100% oxygen Intubation Type: IV induction Ventilation: Mask ventilation without difficulty and Oral airway inserted - appropriate to patient size Laryngoscope Size: 3 and Mac Grade View: Grade II Tube type: Oral Tube size: 7.0 mm Number of attempts: 2 Airway Equipment and Method: Bougie stylet Placement  Confirmation: ETT inserted through vocal cords under direct vision,  positive ETCO2,  CO2 detector and breath sounds checked- equal and bilateral Secured at: 22 cm Tube secured with: Tape Dental Injury: Teeth and Oropharynx as per pre-operative assessment  Difficulty Due To: Difficulty was unanticipated and Difficult Airway- due to anterior larynx Comments: Smooth IVI. EZ mask with OA. Grade 3 view with Mil 2. Unable to pass ETT. Intubation by Dr. Jean Rosenthal. BBS= +ETCO2 after atraum intubation over blue stylet. Grade 2 view with MAC 3 (1st pass esoph, removed immediately). VSS.

## 2012-11-16 NOTE — Anesthesia Preprocedure Evaluation (Signed)
Anesthesia Evaluation  Patient identified by MRN, date of birth, ID band Patient awake    Reviewed: Allergy & Precautions, H&P , NPO status , Patient's Chart, lab work & pertinent test results  History of Anesthesia Complications Negative for: history of anesthetic complications  Airway Mallampati: I TM Distance: >3 FB Neck ROM: Full    Dental  (+) Teeth Intact and Dental Advisory Given   Pulmonary COPDformer smoker (quit 1982),  breath sounds clear to auscultation  Pulmonary exam normal       Cardiovascular hypertension, Pt. on medications Rhythm:Regular Rate:Normal     Neuro/Psych    GI/Hepatic Neg liver ROS, GERD-  Controlled,  Endo/Other  Morbid obesity  Renal/GU Renal diseasenegative Renal ROS     Musculoskeletal   Abdominal (+) + obese,   Peds  Hematology   Anesthesia Other Findings   Reproductive/Obstetrics                           Anesthesia Physical Anesthesia Plan  ASA: II  Anesthesia Plan: General   Post-op Pain Management:    Induction: Intravenous  Airway Management Planned: Oral ETT  Additional Equipment:   Intra-op Plan:   Post-operative Plan: Extubation in OR  Informed Consent: I have reviewed the patients History and Physical, chart, labs and discussed the procedure including the risks, benefits and alternatives for the proposed anesthesia with the patient or authorized representative who has indicated his/her understanding and acceptance.   Dental advisory given  Plan Discussed with: CRNA and Surgeon  Anesthesia Plan Comments: (Plan routine monitors, GETA with femoral nerve block for post op analgesia)        Anesthesia Quick Evaluation

## 2012-11-16 NOTE — Op Note (Signed)
DATE OF SURGERY:  11/16/2012 TIME: 3:23 PM  PATIENT NAME:  Frances Gray   AGE: 62 y.o.    PRE-OPERATIVE DIAGNOSIS:  DJD LEFT KNEE  POST-OPERATIVE DIAGNOSIS:  Same  PROCEDURE:  Procedure(s): TOTAL KNEE ARTHROPLASTY- left   SURGEON:  Eulas Post, MD   ASSISTANT:  Janace Litten, OPA-C, present and scrubbed throughout the case, critical for assistance with exposure, retraction, instrumentation, and closure.   OPERATIVE IMPLANTS: Depuy PFC Sigma, Posterior Stabilized.  Femur size 3, Tibia size 3, Patella size 35 3-peg oval button, with a 10 mm polyethylene insert.   PREOPERATIVE INDICATIONS:  Frances Gray is a 62 y.o. year old female with end stage bone on bone degenerative arthritis of the knee who failed conservative treatment, including injections, antiinflammatories, activity modification, and assistive devices, and had significant impairment of their activities of daily living, and elected for Total Knee Arthroplasty.   The risks, benefits, and alternatives were discussed at length including but not limited to the risks of infection, bleeding, nerve injury, stiffness, blood clots, the need for revision surgery, cardiopulmonary complications, among others, and they were willing to proceed. We also did discuss the fact that her morbid obesity increases the risk for revision surgery as well as perioperative complications. She had a substantial preoperative loss of motion, going from 5 to 80 at most.   OPERATIVE DESCRIPTION:  The patient was brought to the operative room and placed in a supine position.  General anesthesia was administered.  IV antibiotics were given.  The lower extremity was prepped and draped in the usual sterile fashion.  Time out was performed.  The leg was elevated and exsanguinated and the tourniquet was inflated.  Anterior quadriceps tendon splitting approach was performed.  The patella was everted and osteophytes were removed.  The anterior horn of the  medial and lateral meniscus was removed.   The distal femur was opened with the drill and the intramedullary distal femoral cutting jig was utilized, set at 5 degrees resecting 10 mm off the distal femur.  Care was taken to protect the collateral ligaments.  Then the extramedullary tibial cutting jig was utilized making the appropriate cut using the anterior tibial crest as a reference building in appropriate posterior slope.  Care was taken during the cut to protect the medial and collateral ligaments.  The proximal tibia was removed along with the posterior horns of the menisci.  The PCL was sacrificed.    The extensor gap was measured and was approximately 10mm.    The distal femoral sizing jig was applied, taking care to avoid notching.  Then the 4-in-1 cutting jig was applied and the anterior and posterior femur was cut, along with the chamfer cuts.  All posterior osteophytes were removed.  The flexion gap was then measured and was symmetric with the extension gap.  I completed the distal femoral preparation using the appropriate jig to prepare the box.  The patella was then measured, and cut with the saw.  It measured 23 millimeters before the cut, and 14 mm after the cut.  The proximal tibia sized and prepared accordingly with the reamer and the punch, and then all components were trialed with the 10mm poly insert.  The knee was found to have excellent balance and good motion.    The above named components were then cemented into place and all excess cement was removed.  The trial polyethylene component was in place during cementation, and then was exchanged for the real polyethylene component.  The knee was easily taken through a range of motion and the patella tracked well and the knee irrigated copiously and the parapatellar and subcutaneous tissue closed with vicryl, and monocryl with steri strips for the skin.  The final range of motion was approximately 0 to 110, with deeper  flexion block by her morbid obesity.   The wounds were injected with marcaine, and dressed with sterile gauze and the tourniquet released and the patient was awakened and returned to the PACU in stable and satisfactory condition.  There were no complications.  Total tourniquet time was ~90 minutes.

## 2012-11-16 NOTE — Transfer of Care (Signed)
Immediate Anesthesia Transfer of Care Note  Patient: Frances Gray  Procedure(s) Performed: Procedure(s): TOTAL KNEE ARTHROPLASTY- left (Left)  Patient Location: PACU  Anesthesia Type:General  Level of Consciousness: awake, oriented and patient cooperative  Airway & Oxygen Therapy: Patient Spontanous Breathing and Patient connected to nasal cannula oxygen  Post-op Assessment: Report given to PACU RN and Post -op Vital signs reviewed and stable  Post vital signs: Reviewed and stable  Complications: No apparent anesthesia complications

## 2012-11-16 NOTE — H&P (Signed)
PREOPERATIVE H&P  Chief Complaint: DJD LEFT KNEE  HPI: Frances Gray is a 62 y.o. female who presents for preoperative history and physical with a diagnosis of DJD LEFT KNEE. Symptoms are rated as moderate to severe, and have been worsening.  This is significantly impairing activities of daily living.  She has elected for surgical management. She has failed injections, activity modification, use of assistive walking devices, anti-inflammatories, limited by her reflux, among others.  Past Medical History  Diagnosis Date  . Arthritis     R knee  . GERD (gastroesophageal reflux disease)   . Allergic rhinitis, seasonal   . History of chicken pox   . Diverticulitis   . Headaches, cluster   . History of colon polyps   . Heart murmur     no symptoms  . Anemia     has had anemia in the past. none now  . 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 Vitamin D deficiency disease 08/14/2011  . Goiter, nodular     01/01/12 bx: Hashimoto's thyroiditis, no malignancy  . Hypertension     does not see a cardiologist  . Osteoarthritis of left knee 07/03/2011   Past Surgical History  Procedure Laterality Date  . Breast biopsy    . Knee arthroscopy      bilateral  . Esophagus surgery      tumor removed   (benign)  . Lithotripsy  08/2011    R ureteral stone   History   Social History  . Marital Status: Married    Spouse Name: N/A    Number of Children: N/A  . Years of Education: N/A   Social History Main Topics  . Smoking status: Former Smoker -- 0.25 packs/day for 10 years    Quit date: 06/10/1980  . Smokeless tobacco: Never Used  . Alcohol Use: No  . Drug Use: No  . Sexually Active: None   Other Topics Concern  . None   Social History Narrative  . None   Family History  Problem Relation Age of Onset  . Ovarian cancer Mother   . Cancer Mother     Ovarian Cancer  . Colon cancer Other   . Hypertension Other   . Hyperlipidemia Other   .  Heart disease Other   . Breast cancer Other   . Cancer Sister     Breast & Colon Cancer   Allergies  Allergen Reactions  . Amlodipine Cough   Prior to Admission medications   Medication Sig Start Date End Date Taking? Authorizing Provider  Biotin (BIOTIN 5000) 5 MG CAPS Take 5 mg by mouth every morning.    Yes Historical Provider, MD  Cholecalciferol (VITAMIN D3) 2000 UNITS TABS Take 2,000 Units by mouth every morning.    Yes Historical Provider, MD  losartan-hydrochlorothiazide (HYZAAR) 100-25 MG per tablet TAKE 1 TABLET BY MOUTH EVERY DAY 11/10/12  Yes Newt Lukes, MD  Multiple Vitamin (MULTIVITAMIN) tablet Take 1 tablet by mouth every morning.    Yes Historical Provider, MD  naproxen sodium (ANAPROX) 220 MG tablet Take 440 mg by mouth 2 (two) times daily as needed (pain). 08/15/11  Yes Antony Haste, MD     Positive ROS: All other systems have been reviewed and were otherwise negative with the exception of those mentioned in the HPI and as above.  Physical Exam: General: Alert, no acute distress Cardiovascular: No pedal edema Respiratory: No cyanosis, no use of accessory musculature  GI: No organomegaly, abdomen is soft and non-tender Skin: No lesions in the area of chief complaint Neurologic: Sensation intact distally Psychiatric: Patient is competent for consent with normal mood and affect Lymphatic: No axillary or cervical lymphadenopathy  MUSCULOSKELETAL: Left knee has positive crepitance, range of motion from 3 to 120, positive weakness and pain.  X-rays demonstrate end-stage degenerative changes in multiple compartments.  Assessment: DJD LEFT KNEE  Plan: Plan for Procedure(s): TOTAL KNEE ARTHROPLASTY  The risks benefits and alternatives were discussed with the patient including but not limited to the risks of nonoperative treatment, versus surgical intervention including infection, bleeding, nerve injury,  blood clots, cardiopulmonary complications,  morbidity, mortality, among others, and they were willing to proceed.   Eulas Post, MD Cell [phone]   11/16/2012 1:00 PM

## 2012-11-16 NOTE — Preoperative (Signed)
Beta Blockers   Reason not to administer Beta Blockers:Not Applicable 

## 2012-11-17 ENCOUNTER — Encounter (HOSPITAL_COMMUNITY): Payer: Self-pay | Admitting: Orthopedic Surgery

## 2012-11-17 LAB — CBC
HCT: 31 % — ABNORMAL LOW (ref 36.0–46.0)
MCH: 27.7 pg (ref 26.0–34.0)
MCV: 83.3 fL (ref 78.0–100.0)
RDW: 13.7 % (ref 11.5–15.5)
WBC: 7.3 10*3/uL (ref 4.0–10.5)

## 2012-11-17 LAB — BASIC METABOLIC PANEL
BUN: 11 mg/dL (ref 6–23)
CO2: 30 mEq/L (ref 19–32)
Calcium: 8.8 mg/dL (ref 8.4–10.5)
Chloride: 97 mEq/L (ref 96–112)
Creatinine, Ser: 0.68 mg/dL (ref 0.50–1.10)
GFR calc Af Amer: >90 mL/min (ref >90)

## 2012-11-17 NOTE — Discharge Summary (Signed)
Physician Discharge Summary  Patient ID: Frances Gray MRN: [id number] DOB/AGE: [birth year] 62 y.o.  Admit date: 11/16/2012 Anticipated Discharge date: 11/18/2012  Admission Diagnoses:  Osteoarthritis of left knee  Discharge Diagnoses:  Principal Problem:   Osteoarthritis of left knee Active Problems:   Severe obesity (BMI >= 40) mild hypokalemia: observed  Past Medical History  Diagnosis Date  . Arthritis     R knee  . GERD (gastroesophageal reflux disease)   . Allergic rhinitis, seasonal   . History of chicken pox   . Diverticulitis   . Headaches, cluster   . History of colon polyps   . Heart murmur     no symptoms  . Anemia     has had anemia in the past. none now  . 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 Vitamin D deficiency disease 08/14/2011  . Goiter, nodular     01/01/12 bx: Hashimoto's thyroiditis, no malignancy  . Hypertension     does not see a cardiologist  . Osteoarthritis of left knee 07/03/2011  . Severe obesity (BMI >= 40) 11/16/2012    Surgeries: Procedure(s): TOTAL KNEE ARTHROPLASTY- left on 11/16/2012   Consultants (if any):    Discharged Condition: Improved  Hospital Course: Frances Gray is an 62 y.o. female who was admitted 11/16/2012 with a diagnosis of Osteoarthritis of left knee and went to the operating room on 11/16/2012 and underwent the above named procedures.    She was given perioperative antibiotics:  Anti-infectives   Start     Dose/Rate Route Frequency Ordered Stop   11/16/12 1930  ceFAZolin (ANCEF) IVPB 2 g/50 mL premix     2 g 100 mL/hr over 30 Minutes Intravenous Every 6 hours 11/16/12 1721 11/17/12 0226   11/16/12 0600  ceFAZolin (ANCEF) IVPB 2 g/50 mL premix     2 g 100 mL/hr over 30 Minutes Intravenous On call to O.R. 11/15/12 1336 11/16/12 1332    .  She was given sequential compression devices, early ambulation, and lovenox for DVT prophylaxis.  She benefited maximally from the hospital  stay and there were no complications.    Recent vital signs:  Filed Vitals:   11/17/12 0613  BP: 101/54  Pulse: 69  Temp: 98 F (36.7 C)  Resp: 18    Recent laboratory studies:  Lab Results  Component Value Date   HGB 10.3* 11/17/2012   HGB 11.7* 11/16/2012   HGB 12.6 11/10/2012   Lab Results  Component Value Date   WBC 7.3 11/17/2012   PLT 279 11/17/2012   Lab Results  Component Value Date   INR 1.04 11/10/2012   Lab Results  Component Value Date   NA 133* 11/17/2012   K 3.3* 11/17/2012   CL 97 11/17/2012   CO2 30 11/17/2012   BUN 11 11/17/2012   CREATININE 0.68 11/17/2012   GLUCOSE 122* 11/17/2012    Discharge Medications:     Medication List    STOP taking these medications       naproxen sodium 220 MG tablet  Commonly known as:  ANAPROX      TAKE these medications       BIOTIN 5000 5 MG Caps  Generic drug:  Biotin  Take 5 mg by mouth every morning.     enoxaparin 40 MG/0.4ML injection  Commonly known as:  LOVENOX  Inject 0.4 mLs (40 mg total) into the skin daily.     losartan-hydrochlorothiazide 100-25 MG per tablet  Commonly known as:  HYZAAR  TAKE 1 TABLET BY MOUTH EVERY DAY     methocarbamol 500 MG tablet  Commonly known as:  ROBAXIN  Take 1 tablet (500 mg total) by mouth 4 (four) times daily.     multivitamin tablet  Take 1 tablet by mouth every morning.     oxyCODONE-acetaminophen 10-325 MG per tablet  Commonly known as:  PERCOCET  Take 1-2 tablets by mouth every 6 (six) hours as needed for pain. MAXIMUM TOTAL ACETAMINOPHEN DOSE IS 4000 MG PER DAY     sennosides-docusate sodium 8.6-50 MG tablet  Commonly known as:  SENOKOT-S  Take 2 tablets by mouth daily.     Vitamin D3 2000 UNITS Tabs  Take 2,000 Units by mouth every morning.        Diagnostic Studies: Dg Chest 2 View  11/10/2012   *RADIOLOGY REPORT*  Clinical Data: History of hypertension.  Total knee arthroplasty. History of heart murmur, GERD.  CHEST - 2 VIEW  Comparison: None.   Findings: Heart size is normal.  Lungs are free of focal consolidations and pleural effusions.  There is minimal right base scarring or atelectasis.  Mid thoracic degenerative changes are present.  IMPRESSION: No evidence for acute cardiopulmonary abnormality.   Original Report Authenticated By: Norva Pavlov, M.D.   Dg Knee Left Port  11/16/2012   *RADIOLOGY REPORT*  Clinical Data: Postop  PORTABLE LEFT KNEE - 1-2 VIEW  Comparison: None.  Findings: Two portable views of the left knee submitted.  There is left knee prosthesis in anatomic alignment.  Postsurgical changes are noted with small amount of soft tissue periarticular air.  Ice bag artifacts overlying the skin.  IMPRESSION:  There is left knee prosthesis in anatomic alignment.  Postsurgical changes are noted with small amount of soft tissue periarticular air.  Ice bag artifacts overlying the skin.   Original Report Authenticated By: Natasha Mead, M.D.    Disposition: 01-Home or Self Care      Discharge Orders   Future Appointments Provider Department Dept Phone   02/11/2013 10:30 AM Newt Lukes, MD [facility] [phone]   Future Orders Complete By Expires     Call MD / Call 911  As directed     Comments:      If you experience chest pain or shortness of breath, CALL 911 and be transported to the hospital emergency room.  If you develope a fever above 101 F, pus (white drainage) or increased drainage or redness at the wound, or calf pain, call your surgeon's office.    Change dressing  As directed     Comments:      Change dressing in three days, then change the dressing daily with sterile 4 x 4 inch gauze dressing.  You may clean the incision with alcohol prior to redressing.    Constipation Prevention  As directed     Comments:      Drink plenty of fluids.  Prune juice may be helpful.  You may use a stool softener, such as Colace (over the counter) 100 mg twice a day.  Use MiraLax (over the counter) for  constipation as needed.    Diet general  As directed     Discharge instructions  As directed     Comments:      Change dressing in 3 days and reapply fresh dressing, unless you have a splint (half cast).  If you have a splint/cast, just leave in place until your  follow-up appointment.    Keep wounds dry for 3 weeks.  Leave steri-strips in place on skin.  Do not apply lotion or anything to the wound.    Do not put a pillow under the knee. Place it under the heel.  As directed     TED hose  As directed     Comments:      Use stockings (TED hose) for 2 weeks on both leg(s).  You may remove them at night for sleeping.    Weight bearing as tolerated  As directed        Follow-up Information   Follow up with Eulas Post, MD. Schedule an appointment as soon as possible for a visit in 2 weeks.   Contact information:   [address] [place] [address] [phone]        Signed: Eulas Post 11/17/2012, 8:23 AM

## 2012-11-17 NOTE — Evaluation (Signed)
Occupational Therapy Evaluation Patient Details Name: Frances Gray MRN: [id number] DOB: 09-04-1950 Today's Date: 11/17/2012 Time: 8295-6213 OT Time Calculation (min): 16 min  OT Assessment / Plan / Recommendation Clinical Impression  This 62 year old female was admitted for L TKA.  She will benefit from skilled OT in acute to review bathroom transfers for safe discharge home.    OT Assessment  Patient needs continued OT Services    Follow Up Recommendations  No OT follow up    Barriers to Discharge      Equipment Recommendations  None recommended by OT (3:1 was delivered to her house)    Recommendations for Other Services    Frequency  Min 2X/week    Precautions / Restrictions Precautions Precautions: Knee Required Braces or Orthoses: Knee Immobilizer - Left Restrictions Weight Bearing Restrictions: Yes LLE Weight Bearing: Weight bearing as tolerated   Pertinent Vitals/Pain L knee 5/10. Repositioned    ADL  Grooming: Set up Where Assessed - Grooming: Unsupported sitting Upper Body Bathing: Set up Where Assessed - Upper Body Bathing: Unsupported sitting Lower Body Bathing: Minimal assistance (mod A for sit to stand) Where Assessed - Lower Body Bathing: Supported sit to stand Upper Body Dressing: Minimal assistance (iv) Where Assessed - Upper Body Dressing: Unsupported sitting Lower Body Dressing: Moderate assistance Where Assessed - Lower Body Dressing: Supported sit to Pharmacist, hospital: Simulated;Minimal assistance (for steps, mod A for sit to stand) Toilet Transfer Method: Sit to stand Toileting - Architect and Hygiene: Min guard (mod A for sit to stand) Where Assessed - Toileting Clothing Manipulation and Hygiene: Sit to stand from 3-in-1 or toilet Equipment Used: Rolling walker Transfers/Ambulation Related to ADLs: Pt has been walking to bathroom; no needs at this time, but pt stepped forward and back to chair ADL Comments: Discussed reacher--pt  will have assist as needed at home.  Reviewed sequence for dressing once she does it herself and also showed crossing RLE under L to clear space for pants until strong enough to lift herself    OT Diagnosis: Generalized weakness  OT Problem List: Decreased strength;Decreased activity tolerance;Pain;Decreased knowledge of use of DME or AE OT Treatment Interventions: Self-care/ADL training;DME and/or AE instruction;Balance training   OT Goals Acute Rehab OT Goals OT Goal Formulation: With patient Time For Goal Achievement: 11/24/12 Potential to Achieve Goals: Good ADL Goals Pt Will Transfer to Toilet: with supervision;Ambulation;3-in-1 (and complete all aspects) ADL Goal: Toilet Transfer - Progress: Goal set today Pt Will Perform Tub/Shower Transfer: with min assist;Ambulation;Shower transfer (min guard to shower stall with 3:1) ADL Goal: Tub/Shower Transfer - Progress: Goal set today  Visit Information  Last OT Received On: 11/17/12 Assistance Needed: +1    Subjective Data  Subjective: My husband bought a stool so I can get into the bed Patient Stated Goal: none stated; agreeable to OT   Prior Functioning     Home Living Lives With: Spouse Available Help at Discharge: Family Bathroom Shower/Tub: Walk-in shower;Door [employer] Toilet: Standard Home Adaptive Equipment: Hand-held shower hose;Bedside commode/3-in-1;Walker - rolling Prior Function Level of Independence: Independent Able to Take Stairs?: Yes Driving: Yes Vocation: Retired Musician: No difficulties Dominant Hand: Right         Vision/Perception     Cognition  Cognition Arousal/Alertness: Awake/alert Behavior During Therapy: WFL for tasks assessed/performed Overall Cognitive Status: Within Functional Limits for tasks assessed    Extremity/Trunk Assessment Right Upper Extremity Assessment RUE ROM/Strength/Tone: Within functional levels Left Upper Extremity Assessment LUE  ROM/Strength/Tone: Within  functional levels     Mobility Transfers Sit to Stand: 3: Mod assist;With armrests;From chair/3-in-1 Stand to Sit: 4: Min assist;To chair/3-in-1 Details for Transfer Assistance: slow transition from sit to stand from low recliner; cues for hand/foot placement and for technique     Exercise     Balance     End of Session OT - End of Session Activity Tolerance: Patient tolerated treatment well Patient left: in chair;with call bell/phone within reach  GO     Frances Gray 11/17/2012, 2:03 PM Marica Otter, OTR/L [phone] 11/17/2012

## 2012-11-17 NOTE — Anesthesia Postprocedure Evaluation (Signed)
  Anesthesia Post-op Note  Patient: Frances Gray  Procedure(s) Performed: Procedure(s): TOTAL KNEE ARTHROPLASTY- left (Left)  Patient Location: PACU  Anesthesia Type:General  Level of Consciousness: awake, alert  and oriented  Airway and Oxygen Therapy: Patient Spontanous Breathing  Post-op Pain: none  Post-op Assessment: Post-op Vital signs reviewed, Patient's Cardiovascular Status Stable, Respiratory Function Stable, Patent Airway, No signs of Nausea or vomiting, Adequate PO intake and Pain level controlled  Post-op Vital Signs: Reviewed and stable  Complications: No apparent anesthesia complications

## 2012-11-17 NOTE — Progress Notes (Signed)
UR COMPLETED  

## 2012-11-17 NOTE — Evaluation (Signed)
Physical Therapy Evaluation Patient Details Name: Frances Gray MRN: [id number] DOB: 05/09/1951 Today's Date: 11/17/2012 Time: 8295-6213 PT Time Calculation (min): 31 min  PT Assessment / Plan / Recommendation Clinical Impression  Pt is 62 y/o female admitted for s/p left TKA.  Pt very motivated and willing to work hard to go home with husband.  Pt will benefit from acute PT services to improve overall mobility and prepare for safe d/c home with husband.    PT Assessment  Patient needs continued PT services    Follow Up Recommendations  Home health PT;Supervision/Assistance - 24 hour    Barriers to Discharge None      Frequency 7X/week    Precautions / Restrictions Precautions Precautions: Knee Required Braces or Orthoses: Knee Immobilizer - Left Restrictions Weight Bearing Restrictions: Yes LLE Weight Bearing: Weight bearing as tolerated   Pertinent Vitals/Pain 5/10 left knee pain      Mobility  Bed Mobility Bed Mobility: Supine to Sit Supine to Sit: 4: Min assist;With rails;HOB elevated Details for Bed Mobility Assistance: (A) with LLE OOB with cues for proper technique. Pt with heavy use of bed rail to assist pt to upright sitting. Transfers Transfers: Sit to Stand;Stand to Sit Sit to Stand: 4: Min assist;From bed Stand to Sit: 4: Min assist;To chair/3-in-1 Details for Transfer Assistance: (A) to initiate transfer and slowly descend to recliner with cues for hand placement Ambulation/Gait Ambulation/Gait Assistance: 4: Min assist Ambulation Distance (Feet): 15 Feet Assistive device: Rolling walker Ambulation/Gait Assistance Details: (A) to manage RW with cues for step sequence and hand placement Gait Pattern: Step-to pattern;Decreased step length - right;Decreased stance time - left;Antalgic;Trunk flexed Gait velocity: decreased due to pain Stairs: No Wheelchair Mobility Wheelchair Mobility: No    Exercises Total Joint Exercises Ankle Circles/Pumps:  AROM;Both;10 reps Quad Sets: Strengthening;AAROM;Left;5 reps Heel Slides: AAROM;Strengthening;Left;5 reps Goniometric ROM: 10-35 degrees; very limited due to pain and resisting movement   PT Diagnosis: Difficulty walking;Abnormality of gait;Acute pain  PT Problem List: Decreased strength;Decreased range of motion;Decreased activity tolerance;Decreased balance;Decreased mobility;Decreased knowledge of use of DME;Decreased knowledge of precautions;Decreased safety awareness;Pain PT Treatment Interventions:     PT Goals Acute Rehab PT Goals PT Goal Formulation: With patient Time For Goal Achievement: 11/24/12 Potential to Achieve Goals: Good Pt will go Supine/Side to Sit: with modified independence PT Goal: Supine/Side to Sit - Progress: Goal set today Pt will go Sit to Supine/Side: with modified independence PT Goal: Sit to Supine/Side - Progress: Goal set today Pt will go Sit to Stand: with modified independence PT Goal: Sit to Stand - Progress: Goal set today Pt will go Stand to Sit: with modified independence PT Goal: Stand to Sit - Progress: Goal set today Pt will Ambulate: >150 feet;with modified independence;with rolling walker PT Goal: Ambulate - Progress: Goal set today Pt will Go Up / Down Stairs: 3-5 stairs;with min assist;with least restrictive assistive device PT Goal: Up/Down Stairs - Progress: Goal set today Pt will Perform Home Exercise Program: Independently PT Goal: Perform Home Exercise Program - Progress: Goal set today  Visit Information  Last PT Received On: 11/17/12 Assistance Needed: +1    Subjective Data  Subjective: "I'm doing ok." Patient Stated Goal: To go home   Prior Functioning  Home Living Lives With: Spouse Available Help at Discharge: Family Type of Home: House Home Access: Stairs to enter Secretary/administrator of Steps: 2 Entrance Stairs-Rails: None Home Layout: One level Bathroom Shower/Tub: Walk-in shower;Door Bathroom Toilet:  Standard Bathroom Accessibility: Yes How Accessible:  Accessible via walker Home Adaptive Equipment: Hand-held shower hose;Bedside commode/3-in-1;Walker - rolling Prior Function Level of Independence: Independent Able to Take Stairs?: Yes Driving: Yes Vocation: Retired Musician: No difficulties Dominant Hand: Right    Cognition  Cognition Arousal/Alertness: Awake/alert Behavior During Therapy: WFL for tasks assessed/performed Overall Cognitive Status: Within Functional Limits for tasks assessed    Extremity/Trunk Assessment Right Lower Extremity Assessment RLE ROM/Strength/Tone: Within functional levels Left Lower Extremity Assessment LLE ROM/Strength/Tone: Unable to fully assess;Due to pain   Balance    End of Session PT - End of Session Equipment Utilized During Treatment: Gait belt;Left knee immobilizer Activity Tolerance: Patient tolerated treatment well Patient left: in chair;with call bell/phone within reach Nurse Communication: Mobility status  GP     Leean Amezcua 11/17/2012, 11:02 AM Jake Shark, PT DPT [phone]

## 2012-11-17 NOTE — Progress Notes (Signed)
   CARE MANAGEMENT NOTE 11/17/2012  Patient:  Frances Gray,Frances Gray   Account Number:  [id number]  Date Initiated:  11/17/2012  Documentation initiated by:  [facility]  Subjective/Objective Assessment:   TOTAL KNEE ARTHROPLASTY- left     Action/Plan:   lives at home with husband   Anticipated DC Date:  11/18/2012   Anticipated DC Plan:  HOME W HOME HEALTH SERVICES      DC Planning Services  CM consult      Choice offered to / List presented to:  C-1 Patient   DME arranged  3-N-1  WALKER - ROLLING  CPM      DME agency  [employer]     HH arranged  HH-1 RN  HH-2 PT  HH-3 OT      HH agency  [facility]   Status of service:  Completed, signed off Medicare Important Message given?   (If response is "NO", the following Medicare IM given date fields will be blank) Date Medicare IM given:   Date Additional Medicare IM given:    Discharge Disposition:  HOME W HOME HEALTH SERVICES  Per UR Regulation:    If discussed at Long Length of Stay Meetings, dates discussed:    Comments:  11/17/2012 1700 NCM spoke to pt and offered choice. She is agreeable to [facility] for [facility]. Referral sent to [facility] for [facility]. Pt has DME and CPM will be delivered to home when she arrives. Isidoro Donning RN CCM Case Mgmt phone [phone]

## 2012-11-17 NOTE — Progress Notes (Signed)
Patient ID: Frances Gray, female   DOB: 03-23-1951, 62 y.o.   MRN: [id number]     Subjective:  Patient reports pain as mild to moderate.  She states that she is doing okay and was able to get some rest last night.  Objective:   VITALS:   Filed Vitals:   11/16/12 1659 11/16/12 1700 11/16/12 2142 11/17/12 0613  BP:   116/81 101/54  Pulse: 55 55 65 69  Temp:  97 F (36.1 C) 97.8 F (36.6 C) 98 F (36.7 C)  TempSrc:   Oral Oral  Resp: 19 27 19 18   SpO2: 99% 98% 98% 98%    ABD soft Sensation intact distally Dorsiflexion/Plantar flexion intact Incision: dressing C/D/I and no drainage   Lab Results  Component Value Date   WBC 7.3 11/17/2012   HGB 10.3* 11/17/2012   HCT 31.0* 11/17/2012   MCV 83.3 11/17/2012   PLT 279 11/17/2012     Assessment/Plan: 1 Day Post-Op   Principal Problem:   Osteoarthritis of left knee Active Problems:   Severe obesity (BMI >= 40)   Advance diet Up with therapy Continue Ice to left lower ext. Plan dressing change tomorrow Okay to be out of the knee immobilizer.   Haskel Khan 11/17/2012, 7:47 AM   Teryl Lucy, MD Cell [phone]

## 2012-11-17 NOTE — Progress Notes (Signed)
Physical Therapy Treatment Patient Details Name: Frances Gray MRN: [id number] DOB: 09-15-1950 Today's Date: 11/17/2012 Time: 8657-8469 PT Time Calculation (min): 16 min  PT Assessment / Plan / Recommendation Comments on Treatment Session  Pt able to increase ambulation distance however continues to be limited due to pain.  Notified RN on pain  medication and CPM not ordered for pt.      Follow Up Recommendations  Home health PT;Supervision/Assistance - 24 hour     Frequency 7X/week   Plan Discharge plan remains appropriate;Frequency remains appropriate    Precautions / Restrictions Precautions Precautions: Knee Required Braces or Orthoses: Knee Immobilizer - Left Restrictions Weight Bearing Restrictions: Yes LLE Weight Bearing: Weight bearing as tolerated   Pertinent Vitals/Pain 7/10 left knee pain; RN notified    Mobility  Bed Mobility Bed Mobility: Sit to Supine Sit to Supine: 4: Min assist;HOB flat Details for Bed Mobility Assistance: (A) with LLE into bed with cues for proper technique Transfers Transfers: Sit to Stand;Stand to Sit Sit to Stand: 4: Min assist;From chair/3-in-1 Stand to Sit: 4: Min assist;To bed Details for Transfer Assistance: (A) to initiate transfer with max cues for hand placement.  (A) to slowly descend to recliner. Ambulation/Gait Ambulation/Gait Assistance: 4: Min guard Ambulation Distance (Feet): 40 Feet Assistive device: Rolling walker Ambulation/Gait Assistance Details: Minguard for safety with cues for step squence and upright posture.  Cues for body position within RW Gait Pattern: Step-to pattern;Decreased step length - right;Decreased stance time - left;Antalgic;Trunk flexed Gait velocity: decreased due to pain    Exercises     PT Diagnosis:    PT Problem List:   PT Treatment Interventions:     PT Goals Acute Rehab PT Goals PT Goal Formulation: With patient Time For Goal Achievement: 11/24/12 Potential to Achieve Goals:  Good Pt will go Sit to Supine/Side: with modified independence PT Goal: Sit to Supine/Side - Progress: Met Pt will go Sit to Stand: with modified independence PT Goal: Sit to Stand - Progress: Progressing toward goal Pt will go Stand to Sit: with modified independence PT Goal: Stand to Sit - Progress: Progressing toward goal Pt will Ambulate: >150 feet;with modified independence;with rolling walker PT Goal: Ambulate - Progress: Progressing toward goal  Visit Information  Last PT Received On: 11/17/12 Assistance Needed: +1    Subjective Data  Subjective: "I'm ready to lay back down." Patient Stated Goal: To go home   Cognition  Cognition Arousal/Alertness: Awake/alert Behavior During Therapy: WFL for tasks assessed/performed Overall Cognitive Status: Within Functional Limits for tasks assessed    Balance     End of Session PT - End of Session Equipment Utilized During Treatment: Gait belt;Left knee immobilizer Activity Tolerance: Patient tolerated treatment well Patient left: in bed;with call bell/phone within reach Nurse Communication: Mobility status CPM Left Knee CPM Left Knee: On Left Knee Flexion (Degrees): 60 Left Knee Extension (Degrees): 0   GP     Gianlucca Szymborski 11/17/2012, 3:10 PM  Jake Shark, PT DPT [phone]

## 2012-11-17 NOTE — Progress Notes (Signed)
Orthopedic Tech Progress Note Patient Details:  Frances Gray 1950/07/19 [id number]  CPM Left Knee CPM Left Knee: On Left Knee Flexion (Degrees): 60 Left Knee Extension (Degrees): 0   [name] 11/17/2012, 2:53 PM

## 2012-11-18 LAB — CBC
HCT: 30.2 % — ABNORMAL LOW (ref 36.0–46.0)
MCHC: 33.1 g/dL (ref 30.0–36.0)
RDW: 13.3 % (ref 11.5–15.5)

## 2012-11-18 NOTE — Progress Notes (Signed)
Pt is progressing but slowly. She had a difficult time getting out of bed and standing properly before she proceed to move. Ambulation wasn't good either. She leans on the walker to move. Pt was told that was not a proper way to ambulate. Will cont to monitor.

## 2012-11-18 NOTE — Progress Notes (Signed)
Occupational Therapy Treatment Patient Details Name: Frances Gray MRN: [id number] DOB: 02-27-1951 Today's Date: 11/18/2012 Time: 4098-1191 OT Time Calculation (min): 32 min  OT Assessment / Plan / Recommendation Comments on Treatment Session Pt making progress and should continue with acute OT services to maximize level of function and safety to retrun home    Follow Up Recommendations  No OT follow up    Barriers to Discharge   None    Equipment Recommendations  None recommended by OT    Recommendations for Other Services    Frequency Min 2X/week   Plan Discharge plan remains appropriate    Precautions / Restrictions Precautions Precautions: Knee Required Braces or Orthoses: Knee Immobilizer - Left Restrictions Weight Bearing Restrictions: Yes LLE Weight Bearing: Weight bearing as tolerated       ADL  Grooming: Performed;Min guard Where Assessed - Grooming: Supported standing Lower Body Dressing: Moderate assistance Where Assessed - Lower Body Dressing: Supported sitting Toilet Transfer: Performed;Minimal assistance Toilet Transfer Method: Sit to stand Toileting - Clothing Manipulation and Hygiene: Performed;Min guard Where Assessed - Engineer, mining and Hygiene: Standing Tub/Shower Transfer: Performed;Minimal assistance Tub/Shower Transfer Method: Ecologist with back Equipment Used: Rolling walker;Gait belt (ADL A/E, shower chair) ADL Comments: Pt provided with education and demo of all ADL A/E, reviewed techniques for bathing and dressing using reacher and LH bath sponge    OT Diagnosis:    OT Problem List:   OT Treatment Interventions:     OT Goals ADL Goals ADL Goal: Statistician - Progress: Progressing toward goals ADL Goal: Tub/Shower Transfer - Progress: Progressing toward goals  Visit Information  Last OT Received On: 11/18/12 Assistance Needed: +1    Subjective Data  Subjective: " My  husband will get a seat for the shower " Patient Stated Goal: To return home   Prior Functioning       Cognition  Cognition Arousal/Alertness: Awake/alert Behavior During Therapy: WFL for tasks assessed/performed Overall Cognitive Status: Within Functional Limits for tasks assessed    Mobility  Bed Mobility Bed Mobility: Supine to Sit;Sitting - Scoot to Edge of Bed Supine to Sit: 4: Min assist Sitting - Scoot to Edge of Bed: 5: Supervision Sit to Supine: 4: Min assist Details for Bed Mobility Assistance: (A) with LLE into bed with cues for proper technique Transfers Transfers: Sit to Stand;Stand to Sit Sit to Stand: 4: Min guard;From chair/3-in-1;With upper extremity assist;With armrests;From bed;From toilet Stand to Sit: To chair/3-in-1;To toilet;With armrests Details for Transfer Assistance: Cues for safe technique    Exercises      Balance Balance Balance Assessed: No   End of Session OT - End of Session Equipment Utilized During Treatment: Gait belt;Left knee immobilizer;Other (comment) (RW, ADL A/E, shower chair) Activity Tolerance: Patient tolerated treatment well Patient left: in chair;Other (comment) (in rehab gym with PTA)   GO     Galen Manila 11/18/2012, 12:28 PM

## 2012-11-18 NOTE — Progress Notes (Signed)
Patient ID: Frances Gray, female   DOB: Oct 20, 1950, 62 y.o.   MRN: [id number]     Subjective:  Patient reports pain as mild to moderate.  Patient states that she is in a little more pain today and can't seem to get comfortable.  Objective:   VITALS:   Filed Vitals:   11/17/12 4098 11/17/12 1357 11/17/12 2021 11/18/12 0500  BP: 101/54 112/59 133/62 141/68  Pulse: 69 74 88 91  Temp: 98 F (36.7 C) 98 F (36.7 C) 100 F (37.8 C) 98.9 F (37.2 C)  TempSrc: Oral  Oral Oral  Resp: 18 18 18 18   SpO2: 98% 96% 97% 98%    ABD soft Sensation intact distally Dorsiflexion/Plantar flexion intact Incision: dressing C/D/I and no drainage   Lab Results  Component Value Date   WBC 11.9* 11/18/2012   HGB 10.0* 11/18/2012   HCT 30.2* 11/18/2012   MCV 81.2 11/18/2012   PLT 275 11/18/2012     Assessment/Plan: 2 Days Post-Op   Principal Problem:   Osteoarthritis of left knee Active Problems:   Severe obesity (BMI >= 40) ABLA continue to monitor  Advance diet Up with therapy Plan for discharge tomorrow Plan for dressing change tomorrow Continue CPM Okay to be out of immobilizer    Haskel Khan 11/18/2012, 7:42 AM   Frances Lucy, MD Cell [phone]

## 2012-11-18 NOTE — Progress Notes (Signed)
Physical Therapy Treatment Patient Details Name: Frances Gray MRN: [id number] DOB: [birth year] Today's Date: 11/18/2012 Time: 9147-8295 PT Time Calculation (min): 27 min  PT Assessment / Plan / Recommendation Comments on Treatment Session  Patient very motivated today to work on ambulation and stairs. Patient progressing well. Will work on ambulation and therex this afternooon.     Follow Up Recommendations  Home health PT;Supervision/Assistance - 24 hour     Does the patient have the potential to tolerate intense rehabilitation     Barriers to Discharge        Equipment Recommendations       Recommendations for Other Services    Frequency 7X/week   Plan Discharge plan remains appropriate;Frequency remains appropriate    Precautions / Restrictions Precautions Precautions: Knee Required Braces or Orthoses: Knee Immobilizer - Left Restrictions LLE Weight Bearing: Weight bearing as tolerated   Pertinent Vitals/Pain     Mobility  Bed Mobility Sit to Supine: 4: Min assist Details for Bed Mobility Assistance: (A) with LLE into bed with cues for proper technique Transfers Sit to Stand: 4: Min guard;From chair/3-in-1;With upper extremity assist;With armrests Stand to Sit: With armrests;To bed;To chair/3-in-1;4: Min guard Details for Transfer Assistance: Cues for safe technique Ambulation/Gait Ambulation/Gait Assistance: 4: Min guard Ambulation Distance (Feet): 180 Feet Assistive device: Rolling walker Ambulation/Gait Assistance Details: Cues for posture and positoning on RW. One standing rest break Gait Pattern: Step-to pattern;Decreased step length - right;Decreased stance time - left;Antalgic;Trunk flexed Gait velocity: decreased iniitally but improving Stairs: Yes Stairs Assistance: 4: Min assist Stairs Assistance Details (indicate cue type and reason): A for RW. Cueing for technique and seqency Stair Management Technique: Backwards;With walker Number of Stairs: 2     Exercises     PT Diagnosis:    PT Problem List:   PT Treatment Interventions:     PT Goals Acute Rehab PT Goals PT Goal: Sit to Supine/Side - Progress: Progressing toward goal PT Goal: Sit to Stand - Progress: Progressing toward goal PT Goal: Stand to Sit - Progress: Progressing toward goal PT Goal: Ambulate - Progress: Progressing toward goal PT Goal: Up/Down Stairs - Progress: Progressing toward goal PT Goal: Perform Home Exercise Program - Progress: Progressing toward goal  Visit Information  Last PT Received On: 11/18/12 Assistance Needed: +1    Subjective Data      Cognition  Cognition Arousal/Alertness: Awake/alert Behavior During Therapy: WFL for tasks assessed/performed Overall Cognitive Status: Within Functional Limits for tasks assessed    Balance     End of Session PT - End of Session Equipment Utilized During Treatment: Gait belt;Left knee immobilizer Activity Tolerance: Patient tolerated treatment well Patient left: in bed;with call bell/phone within reach;in CPM Nurse Communication: Mobility status CPM Left Knee CPM Left Knee: On Left Knee Flexion (Degrees): 60   GP     Carter Kassel, Adline Potter 11/18/2012, 11:32 AM 11/18/2012 Fredrich Birks PTA [phone] pager [phone] office

## 2012-11-18 NOTE — Progress Notes (Signed)
Physical Therapy Treatment Patient Details Name: Frances Gray MRN: [id number] DOB: Dec 03, 1950 Today's Date: 11/18/2012 Time: 1191-4782 PT Time Calculation (min): 29 min  PT Assessment / Plan / Recommendation Comments on Treatment Session  Patient progressing well. A little increased soreness after ambulation this morning. Will practice steps with husband present tomorrow morning. Anticipate DC tomorrow    Follow Up Recommendations  Home health PT;Supervision/Assistance - 24 hour     Does the patient have the potential to tolerate intense rehabilitation     Barriers to Discharge        Equipment Recommendations       Recommendations for Other Services    Frequency 7X/week   Plan Discharge plan remains appropriate;Frequency remains appropriate    Precautions / Restrictions Precautions Precautions: Knee Required Braces or Orthoses: Knee Immobilizer - Left Restrictions Weight Bearing Restrictions: Yes LLE Weight Bearing: Weight bearing as tolerated   Pertinent Vitals/Pain     Mobility  Bed Mobility Bed Mobility: Supine to Sit;Sitting - Scoot to Edge of Bed Supine to Sit: 4: Min guard Sitting - Scoot to [place] of Bed: 5: Supervision Sit to Supine: 4: Min assist Details for Bed Mobility Assistance:  cues for proper technique Transfers Sit to Stand: 4: Min guard;From chair/3-in-1;With upper extremity assist;With armrests;From bed Stand to Sit: To chair/3-in-1;With armrests;4: Min guard Details for Transfer Assistance: Cues for safe technique Ambulation/Gait Ambulation/Gait Assistance: 4: Min guard Ambulation Distance (Feet): 120 Feet Assistive device: Rolling walker Ambulation/Gait Assistance Details: Cues for upright posture which got better with increased ambulation. Cues to increase weight through LEs Gait Pattern: Step-to pattern;Decreased step length - right;Decreased stance time - left;Antalgic;Trunk flexed Gait velocity: decreased iniitally but improving Stairs:  Yes Stairs Assistance: 4: Min assist Stairs Assistance Details (indicate cue type and reason): A for RW. Cueing for technique and seqency Stair Management Technique: Backwards;With walker Number of Stairs: 2    Exercises Total Joint Exercises Quad Sets: Strengthening;AAROM;Left Heel Slides: AAROM;Strengthening;Left;10 reps Hip ABduction/ADduction: AAROM;Strengthening;Left;10 reps Straight Leg Raises: AAROM;Strengthening;Left;10 reps   PT Diagnosis:    PT Problem List:   PT Treatment Interventions:     PT Goals Acute Rehab PT Goals PT Goal: Supine/Side to Sit - Progress: Progressing toward goal PT Goal: Sit to Supine/Side - Progress: Progressing toward goal PT Goal: Sit to Stand - Progress: Progressing toward goal PT Goal: Stand to Sit - Progress: Progressing toward goal PT Goal: Ambulate - Progress: Progressing toward goal PT Goal: Up/Down Stairs - Progress: Progressing toward goal PT Goal: Perform Home Exercise Program - Progress: Progressing toward goal  Visit Information  Last PT Received On: 11/18/12 Assistance Needed: +1    Subjective Data      Cognition  Cognition Arousal/Alertness: Awake/alert Behavior During Therapy: WFL for tasks assessed/performed Overall Cognitive Status: Within Functional Limits for tasks assessed    Balance  Balance Balance Assessed: No  End of Session PT - End of Session Equipment Utilized During Treatment: Gait belt;Left knee immobilizer Activity Tolerance: Patient tolerated treatment well Patient left: in chair;with call bell/phone within reach Nurse Communication: Mobility status CPM Left Knee CPM Left Knee: On Left Knee Flexion (Degrees): 60   GP     Robinette, Adline Potter 11/18/2012, 2:30 PM  11/18/2012 Fredrich Birks PTA [phone] pager [phone] office

## 2012-11-19 LAB — CBC
MCH: 27.9 pg (ref 26.0–34.0)
Platelets: 273 10*3/uL (ref 150–400)
RBC: 3.51 MIL/uL — ABNORMAL LOW (ref 3.87–5.11)
RDW: 13.2 % (ref 11.5–15.5)

## 2012-11-19 NOTE — Progress Notes (Signed)
Physical Therapy Treatment Patient Details Name: Frances Gray MRN: [id number] DOB: [birth year] Today's Date: 11/19/2012 Time: 1001-1040 PT Time Calculation (min): 39 min  PT Assessment / Plan / Recommendation Comments on Treatment Session  Patient moving a little slower this morning but starting to progress well with some increased time. Patient now planning to DC home tomorrow.     Follow Up Recommendations  Home health PT;Supervision/Assistance - 24 hour     Does the patient have the potential to tolerate intense rehabilitation     Barriers to Discharge        Equipment Recommendations       Recommendations for Other Services    Frequency 7X/week   Plan Discharge plan remains appropriate;Frequency remains appropriate    Precautions / Restrictions Precautions Precautions: Knee Required Braces or Orthoses: Knee Immobilizer - Left Restrictions LLE Weight Bearing: Weight bearing as tolerated   Pertinent Vitals/Pain     Mobility  Bed Mobility Supine to Sit: 4: Min assist Sitting - Scoot to Edge of Bed: 4: Min assist Details for Bed Mobility Assistance:  cues for proper technique. A for LLE Transfers Sit to Stand: 4: Min guard;From chair/3-in-1;With upper extremity assist;With armrests;From bed Stand to Sit: To chair/3-in-1;With armrests;4: Min guard Details for Transfer Assistance: Cues for safe technique Ambulation/Gait Ambulation/Gait Assistance: 4: Min guard Ambulation Distance (Feet): 200 Feet Assistive device: Rolling walker Ambulation/Gait Assistance Details: Cues for posture. Starting with step through pattern Gait Pattern: Trunk flexed;Step-through pattern;Decreased stride length Stairs Assistance: 4: Min assist Stairs Assistance Details (indicate cue type and reason): Cues for technique Stair Management Technique: Backwards;With walker Number of Stairs: 2    Exercises     PT Diagnosis:    PT Problem List:   PT Treatment Interventions:     PT  Goals Acute Rehab PT Goals PT Goal: Supine/Side to Sit - Progress: Progressing toward goal PT Goal: Sit to Stand - Progress: Progressing toward goal PT Goal: Stand to Sit - Progress: Progressing toward goal PT Goal: Ambulate - Progress: Progressing toward goal PT Goal: Up/Down Stairs - Progress: Progressing toward goal  Visit Information  Last PT Received On: 11/19/12 Assistance Needed: +1    Subjective Data      Cognition  Cognition Arousal/Alertness: Awake/alert Behavior During Therapy: WFL for tasks assessed/performed Overall Cognitive Status: Within Functional Limits for tasks assessed    Balance     End of Session PT - End of Session Equipment Utilized During Treatment: Gait belt;Left knee immobilizer Activity Tolerance: Patient tolerated treatment well Patient left: in chair;with call bell/phone within reach Nurse Communication: Mobility status   GP     Fredrich Birks 11/19/2012, 11:32 AM 11/19/2012 Fredrich Birks PTA [phone] pager [phone] office

## 2012-11-19 NOTE — Progress Notes (Signed)
Patient ID: Frances Gray, female   DOB: 11-22-1950, 62 y.o.   MRN: [id number]     Subjective:  Patient reports pain as mild.  Patient states that she has been up with PT and walked to the far end of the hallway  Objective:   VITALS:   Filed Vitals:   11/18/12 0500 11/18/12 1357 11/18/12 2100 11/19/12 0545  BP: 141/68 139/71 140/68 132/70  Pulse: 91 89 86 89  Temp: 98.9 F (37.2 C) 99.1 F (37.3 C) 98.7 F (37.1 C) 98.7 F (37.1 C)  TempSrc: Oral     Resp: 18 16 18 16   SpO2: 98% 98% 98% 99%    ABD soft Sensation intact distally Dorsiflexion/Plantar flexion intact Incision: dressing C/D/I and no drainage   Lab Results  Component Value Date   WBC 11.6* 11/19/2012   HGB 9.8* 11/19/2012   HCT 28.6* 11/19/2012   MCV 81.5 11/19/2012   PLT 273 11/19/2012     Assessment/Plan: 3 Days Post-Op   Principal Problem:   Osteoarthritis of left knee Active Problems:   Severe obesity (BMI >= 40)   Advance diet Up with therapy Discharge home with home health Work with PT this am and plan for DC home in the PM per Dr Shelba Flake DC orders and rx's are on the chart ABLA stable   Haskel Khan 11/19/2012, 7:38 AM   Teryl Lucy, MD Cell [phone]

## 2012-11-19 NOTE — Progress Notes (Signed)
Physical Therapy Treatment Patient Details Name: Frances Gray MRN: [id number] DOB: [birth year] Today's Date: 11/19/2012 Time: 2725-3664 PT Time Calculation (min): 34 min  PT Assessment / Plan / Recommendation Comments on Treatment Session  Patient continuing to make good progress this afternoon. Walking well and tolerated therex well. Continue with POC and planned DC in AM per MD notes    Follow Up Recommendations  Home health PT;Supervision/Assistance - 24 hour     Does the patient have the potential to tolerate intense rehabilitation     Barriers to Discharge        Equipment Recommendations       Recommendations for Other Services    Frequency 7X/week   Plan Discharge plan remains appropriate;Frequency remains appropriate    Precautions / Restrictions Precautions Precautions: Knee Required Braces or Orthoses: Knee Immobilizer - Left Restrictions LLE Weight Bearing: Weight bearing as tolerated   Pertinent Vitals/Pain     Mobility  Bed Mobility Supine to Sit: 4: Min assist Sitting - Scoot to Edge of Bed: 4: Min assist Details for Bed Mobility Assistance:  A for LLE Transfers Sit to Stand: 5: Supervision;With upper extremity assist;From bed;From chair/3-in-1;With armrests Stand to Sit: To chair/3-in-1;To bed;With upper extremity assist;With armrests Details for Transfer Assistance: Cues for safe technique Ambulation/Gait Ambulation/Gait Assistance: 5: Supervision Ambulation Distance (Feet): 200 Feet Assistive device: Rolling walker Ambulation/Gait Assistance Details: Patient able to have better posture after first couple steps and adjustment of RW Gait Pattern: Step-through pattern;Decreased stride length Stairs Assistance: 4: Min assist Stairs Assistance Details (indicate cue type and reason): Cues for technique Stair Management Technique: Backwards;With walker Number of Stairs: 2    Exercises Total Joint Exercises Quad Sets: Strengthening;AAROM;Left Heel  Slides: AAROM;Strengthening;Left;10 reps Hip ABduction/ADduction: AAROM;Strengthening;Left;10 reps Straight Leg Raises: AAROM;Strengthening;Left;10 reps   PT Diagnosis:    PT Problem List:   PT Treatment Interventions:     PT Goals Acute Rehab PT Goals PT Goal: Supine/Side to Sit - Progress: Progressing toward goal PT Goal: Sit to Supine/Side - Progress: Progressing toward goal PT Goal: Sit to Stand - Progress: Progressing toward goal PT Goal: Stand to Sit - Progress: Progressing toward goal PT Goal: Ambulate - Progress: Progressing toward goal PT Goal: Up/Down Stairs - Progress: Progressing toward goal PT Goal: Perform Home Exercise Program - Progress: Progressing toward goal  Visit Information  Last PT Received On: 11/19/12 Assistance Needed: +1    Subjective Data      Cognition  Cognition Arousal/Alertness: Awake/alert Behavior During Therapy: WFL for tasks assessed/performed Overall Cognitive Status: Within Functional Limits for tasks assessed    Balance     End of Session PT - End of Session Equipment Utilized During Treatment: Gait belt;Left knee immobilizer Activity Tolerance: Patient tolerated treatment well Patient left: in bed;in CPM;with call bell/phone within reach;with family/visitor present Nurse Communication: Mobility status CPM Left Knee CPM Left Knee: On Left Knee Flexion (Degrees): 60 Left Knee Extension (Degrees): 0 Additional Comments: on at 1430   GP     Robinette, Adline Potter 11/19/2012, 2:57 PM  11/19/2012 Fredrich Birks PTA [phone] pager [phone] office

## 2012-11-20 NOTE — Progress Notes (Signed)
Occupational Therapy Treatment Patient Details Name: Frances Gray MRN: [id number] DOB: [birth year] Today's Date: 11/20/2012 Time: 4098-1191 OT Time Calculation (min): 26 min  OT Assessment / Plan / Recommendation Comments on Treatment Session Pt practiced donning/doffing socks with reacher and sockaid. Reviewed long handled sponge, long handled shoe horn as well that comes in kit. Practiced simulated shower transfer.      Follow Up Recommendations  Home health OT;Supervision/Assistance - 24 hour    Barriers to Discharge       Equipment Recommendations  None recommended by OT    Recommendations for Other Services    Frequency Min 2X/week   Plan Discharge plan needs to be updated    Precautions / Restrictions Precautions Precautions: Knee Required Braces or Orthoses: Knee Immobilizer - Left Restrictions Weight Bearing Restrictions: Yes LLE Weight Bearing: Weight bearing as tolerated   Pertinent Vitals/Pain Pain 6/10 in knee. Repositioned. Notified nurse for pain meds.     ADL  Lower Body Dressing: Minimal assistance (practiced donning/doffing socks) Where Assessed - Lower Body Dressing: Unsupported sitting Toilet Transfer: Min guard Toilet Transfer Method: Sit to stand Toilet Transfer Equipment: Raised toilet seat with arms (or 3-in-1 over toilet) Tub/Shower Transfer: Simulated;Minimal assistance Tub/Shower Transfer Method: Science writer: Other (comment);Walk in shower (3 in 1) Equipment Used: Gait belt;Knee Immobilizer;Rolling walker;Sock aid;Reacher;Long-handled sponge;Long-handled shoe horn Transfers/Ambulation Related to ADLs: Minguard ADL Comments: Pt practiced simulated shower transfer at Min A level to maneuver walker and sequencing. Pt also practiced donning socks with reacher and donning with sockaid. Min A  to lift leg to doff sock as pt was unable to get sock under heel.  Discussed with pt the correct dressing technique and how to use  reacher to don pants/underwear.     OT Diagnosis:    OT Problem List:   OT Treatment Interventions:     OT Goals Acute Rehab OT Goals OT Goal Formulation: With patient Time For Goal Achievement: 11/24/12 Potential to Achieve Goals: Good ADL Goals Pt Will Transfer to Toilet: with supervision;Ambulation;3-in-1 (and complete all aspects) ADL Goal: Toilet Transfer - Progress: Progressing toward goals Pt Will Perform Tub/Shower Transfer: with min assist;Ambulation;Shower transfer (minguard to shower stall with 3:1) ADL Goal: Tub/Shower Transfer - Progress: Progressing toward goals  Visit Information  Last OT Received On: 11/20/12 Assistance Needed: +1    Subjective Data      Prior Functioning       Cognition  Cognition Arousal/Alertness: Awake/alert Behavior During Therapy: WFL for tasks assessed/performed Overall Cognitive Status: Within Functional Limits for tasks assessed    Mobility  Bed Mobility Bed Mobility: Supine to Sit Supine to Sit: 4: Min assist;HOB flat;With rails Details for Bed Mobility Assistance: A with LLE and cues to try not use rails.  Transfers Transfers: Sit to Stand;Stand to Sit Sit to Stand: 4: Min guard;With upper extremity assist;From bed;From chair/3-in-1 Stand to Sit: 4: Min guard;With upper extremity assist;To chair/3-in-1 Details for Transfer Assistance: cues for safe technique. Minguard for safety       Balance     End of Session OT - End of Session Equipment Utilized During Treatment: Gait belt;Left knee immobilizer Activity Tolerance: Patient tolerated treatment well Patient left: in chair;with call bell/phone within reach;with family/visitor present Nurse Communication: Patient requests pain meds   GO     Earlie Raveling OTR/L [phone] 11/20/2012, 1:07 PM

## 2012-11-20 NOTE — Progress Notes (Signed)
Patient ID: Frances Gray, female   DOB: 1950/10/26, 62 y.o.   MRN: [id number]     Subjective:  Patient reports pain as mild to moderate.  Patient denies any CP or SOB sitting up in the bed eating breakfast  Objective:   VITALS:   Filed Vitals:   11/19/12 2241 11/20/12 0030 11/20/12 0436 11/20/12 0532  BP:    113/61  Pulse: 92 95 94 88  Temp:    98.9 F (37.2 C)  TempSrc:    Oral  Resp:  18 18 18   Height:      Weight:      SpO2:  100% 99% 97%    ABD soft Sensation intact distally Dorsiflexion/Plantar flexion intact Incision: dressing C/D/I and no drainage   Lab Results  Component Value Date   WBC 11.6* 11/19/2012   HGB 9.8* 11/19/2012   HCT 28.6* 11/19/2012   MCV 81.5 11/19/2012   PLT 273 11/19/2012     Assessment/Plan: 4 Days Post-Op   Principal Problem:   Osteoarthritis of left knee Active Problems:   Severe obesity (BMI >= 40)   Advance diet Up with therapy Discharge home with home health   Haskel Khan 11/20/2012, 7:31 AM   Teryl Lucy, MD Cell [phone]

## 2012-11-20 NOTE — Progress Notes (Signed)
Physical Therapy Treatment Patient Details Name: Modine Oppenheimer MRN: [id number] DOB: 12/03/1950 Today's Date: 11/20/2012 Time: 1191-4782 PT Time Calculation (min): 26 min  PT Assessment / Plan / Recommendation Comments on Treatment Session  Patient continues to make good progress. Anticipate DC today    Follow Up Recommendations  Home health PT;Supervision/Assistance - 24 hour     Does the patient have the potential to tolerate intense rehabilitation     Barriers to Discharge        Equipment Recommendations       Recommendations for Other Services    Frequency 7X/week   Plan Discharge plan remains appropriate;Frequency remains appropriate    Precautions / Restrictions Precautions Precautions: Knee Required Braces or Orthoses: Knee Immobilizer - Left Restrictions LLE Weight Bearing: Weight bearing as tolerated   Pertinent Vitals/Pain     Mobility  Bed Mobility Bed Mobility: Not assessed Transfers Sit to Stand: 5: Supervision;With upper extremity assist;From bed;From chair/3-in-1;With armrests Stand to Sit: To chair/3-in-1;To bed;With upper extremity assist;With armrests;5: Supervision Ambulation/Gait Ambulation/Gait Assistance: 5: Supervision Ambulation Distance (Feet): 250 Feet Assistive device: Rolling walker Gait Pattern: Step-through pattern;Decreased stride length    Exercises Total Joint Exercises Quad Sets: Strengthening;AAROM;Left;15 reps Heel Slides: AAROM;Strengthening;Left;15 reps Hip ABduction/ADduction: AAROM;Strengthening;Left;15 reps Straight Leg Raises: AAROM;Strengthening;Left;15 reps   PT Diagnosis:    PT Problem List:   PT Treatment Interventions:     PT Goals Acute Rehab PT Goals PT Goal: Sit to Stand - Progress: Progressing toward goal PT Goal: Stand to Sit - Progress: Progressing toward goal PT Goal: Ambulate - Progress: Progressing toward goal PT Goal: Perform Home Exercise Program - Progress: Progressing toward goal  Visit  Information  Last PT Received On: 11/20/12 Assistance Needed: +1    Subjective Data      Cognition  Cognition Arousal/Alertness: Awake/alert Behavior During Therapy: WFL for tasks assessed/performed Overall Cognitive Status: Within Functional Limits for tasks assessed    Balance     End of Session PT - End of Session Equipment Utilized During Treatment: Gait belt;Left knee immobilizer Activity Tolerance: Patient tolerated treatment well Patient left: in bed;in CPM;with call bell/phone within reach;with family/visitor present CPM Left Knee Left Knee Flexion (Degrees): 60 Left Knee Extension (Degrees): 0   GP     Fredrich Birks 11/20/2012, 11:54 AM  11/20/2012 Fredrich Birks PTA [phone] pager [phone] office

## 2012-12-21 ENCOUNTER — Telehealth (INDEPENDENT_AMBULATORY_CARE_PROVIDER_SITE_OTHER): Payer: Self-pay

## 2012-12-21 NOTE — Telephone Encounter (Signed)
Pt advised thyroid ultrasound due. Pt will call [facility] to make appt and call back to let us know date and to make appt with Dr Gerrit Friends.

## 2012-12-30 IMAGING — CR DG ABDOMEN 1V
2 series · 2 of 2 positions shown · non-contrast
Comparison: 08/05/2011

CLINICAL DATA: Renal calculi,.  Lithotripsy

ABDOMEN - 1 VIEW

[t abdomen supine * (1 of 2)]
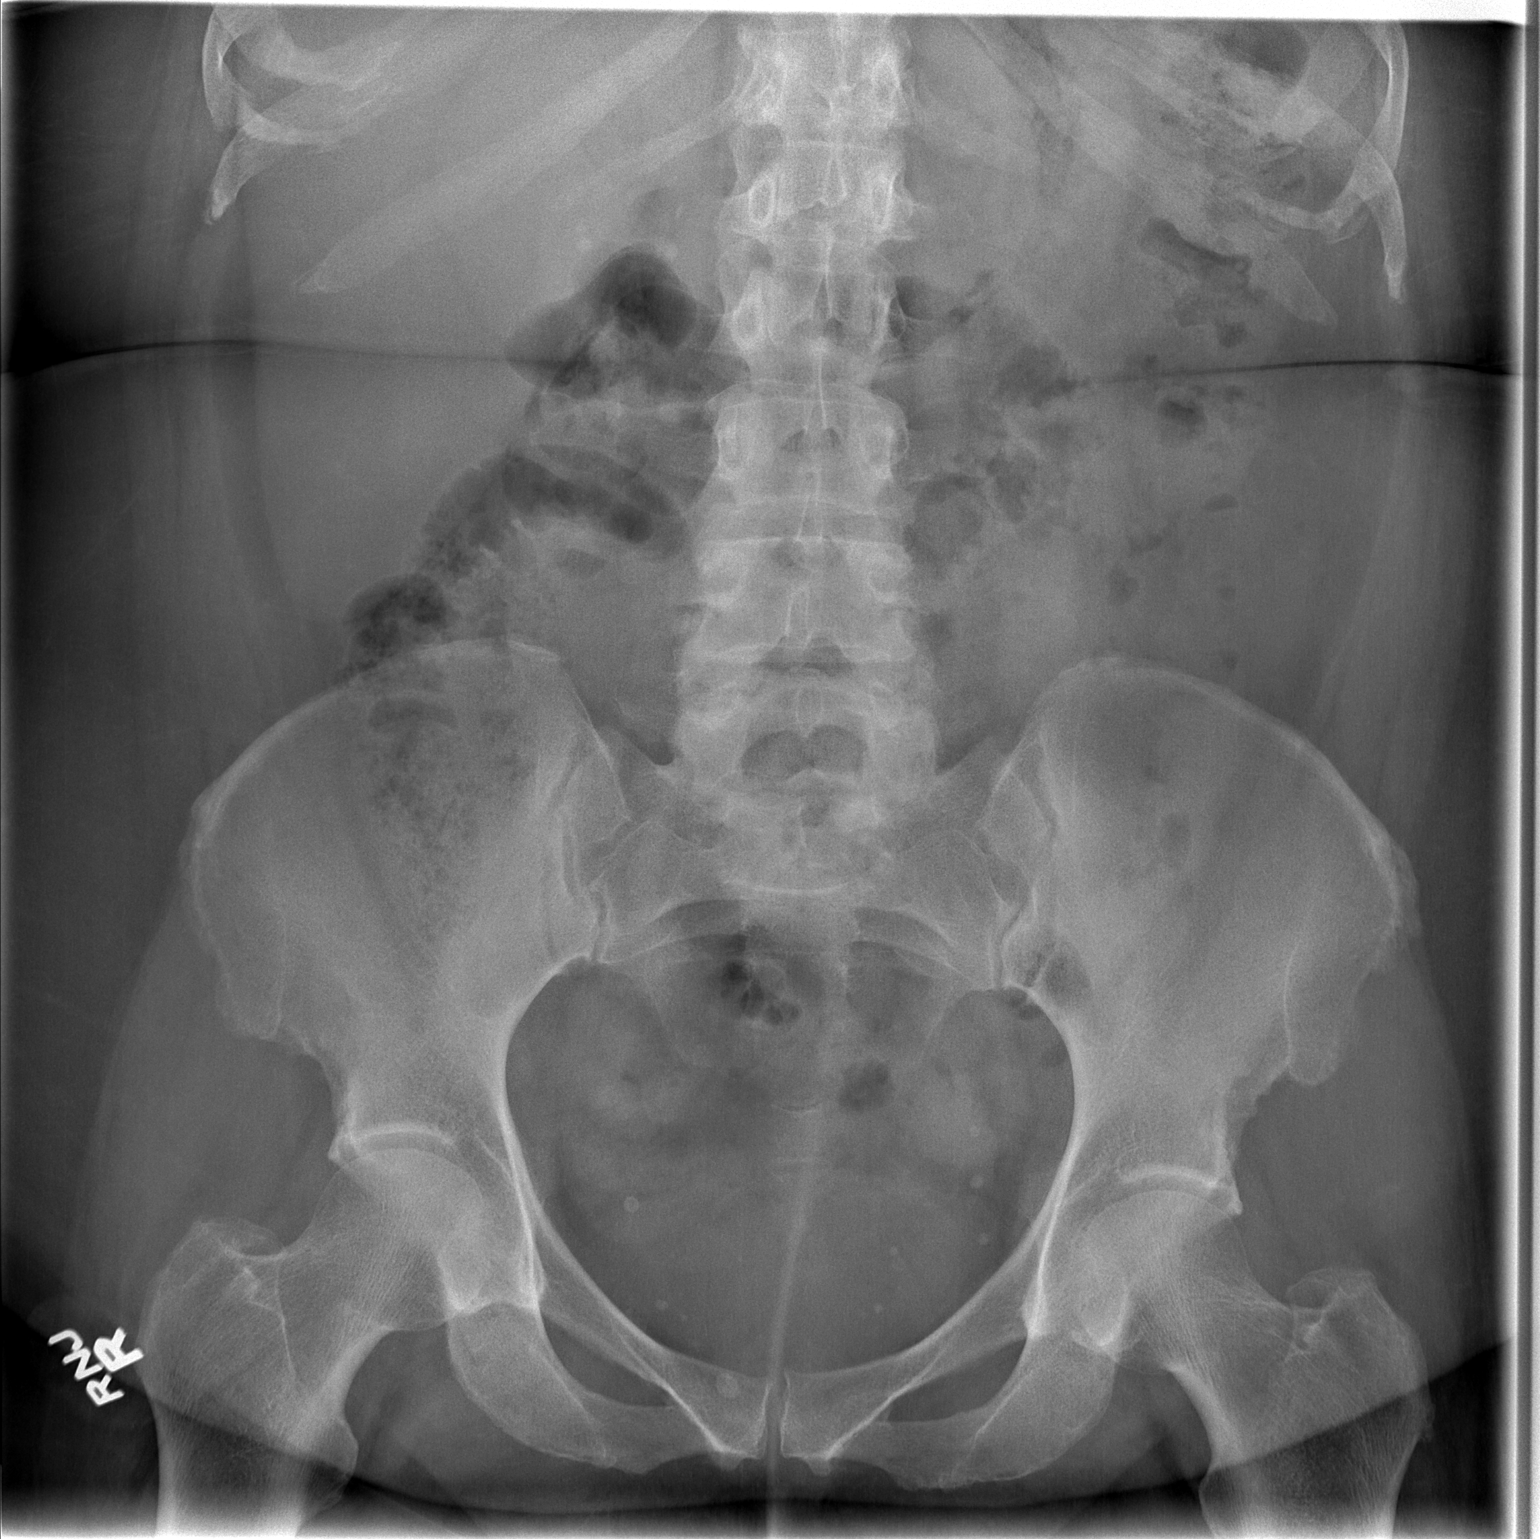

[t abdomen supine * (2 of 2)]
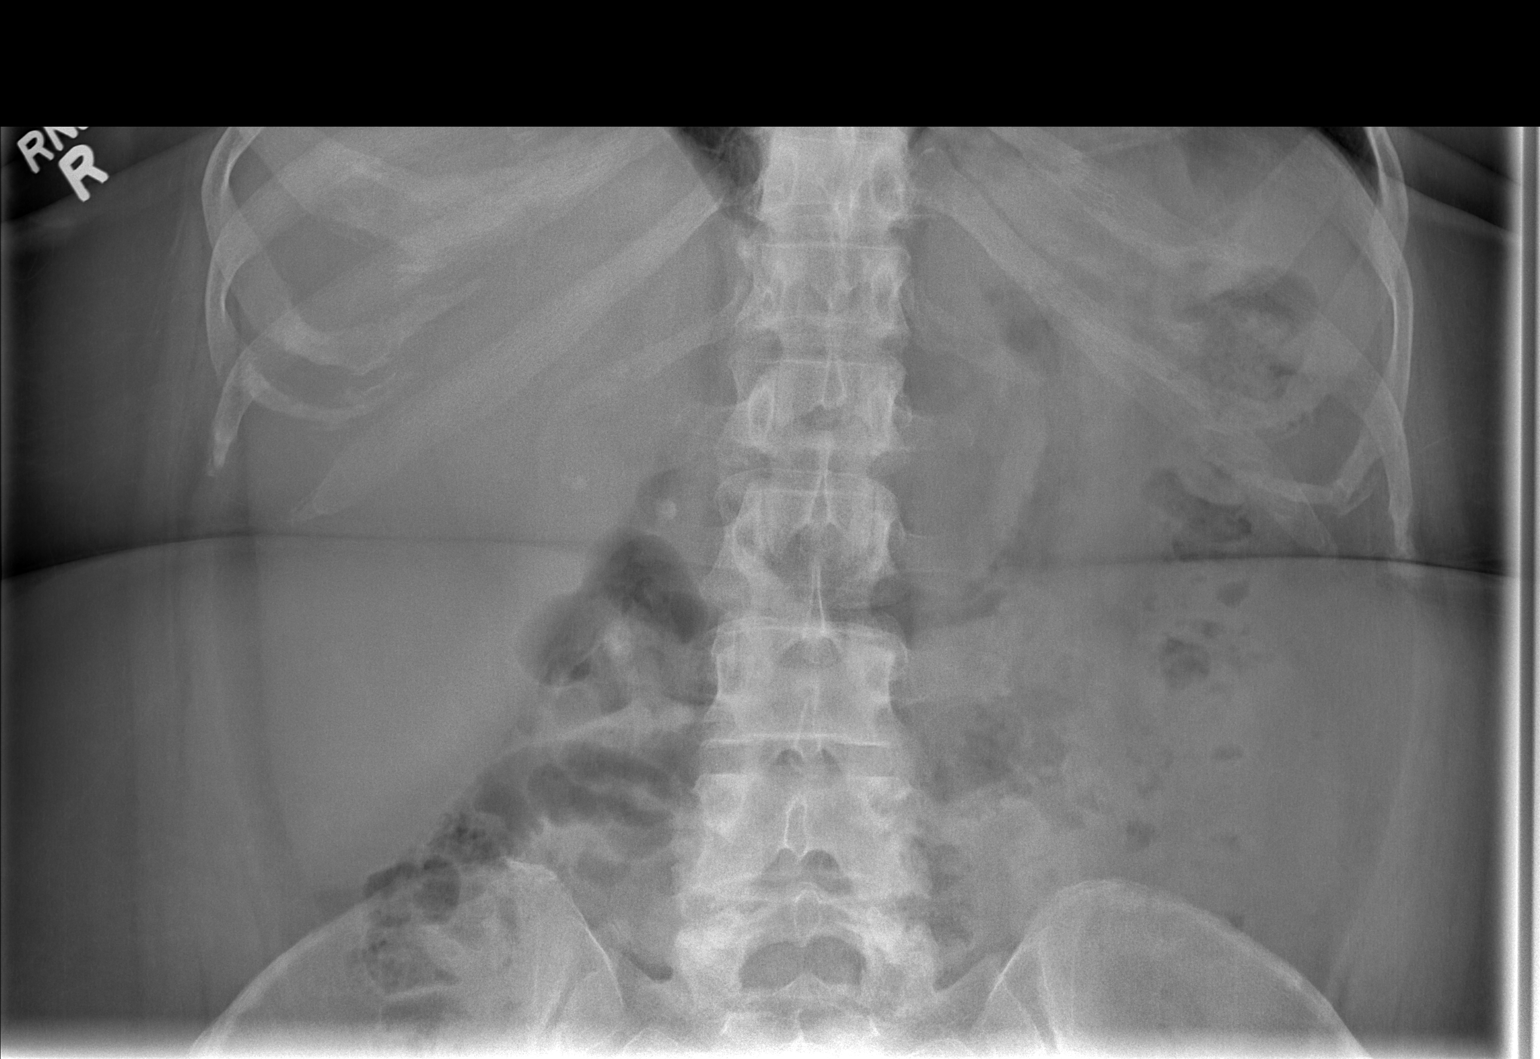

[2 of 2 positions shown; findings below may reference images not displayed]

FINDINGS: Numerous pelvic phleboliths bilaterally, stable.
Two right renal calculi identified, a 5 mm calculus at the inferior
pole and a 6 x 5 mm diameter calculus projecting over the right
renal pelvis.
Additional density is seen right paraspinal just above the right
transverse process of L3, likely superimposed bowel artifact, with
no calculus seen at this site or at the right kidney on the
preceding study.
No additional urinary tract calcifications.
Bowel gas pattern normal.
Bones unremarkable.
IMPRESSION: Right renal calculi as above.

## 2013-01-11 ENCOUNTER — Telehealth (INDEPENDENT_AMBULATORY_CARE_PROVIDER_SITE_OTHER): Payer: Self-pay

## 2013-01-11 NOTE — Telephone Encounter (Signed)
LMOM for pt to call and make f/u appt with Dr Gerrit Friends for exam.

## 2013-01-18 ENCOUNTER — Other Ambulatory Visit: Payer: Federal, State, Local not specified - PPO

## 2013-01-26 ENCOUNTER — Ambulatory Visit
Admission: RE | Admit: 2013-01-26 | Discharge: 2013-01-26 | Disposition: A | Discharge status: Home / Self Care | Payer: Federal, State, Local not specified - PPO | Source: Ambulatory Visit | Attending: Surgery | Admitting: Surgery

## 2013-01-26 DIAGNOSIS — E042 Nontoxic multinodular goiter: Secondary | ICD-10-CM

## 2013-02-11 ENCOUNTER — Other Ambulatory Visit (INDEPENDENT_AMBULATORY_CARE_PROVIDER_SITE_OTHER): Payer: Federal, State, Local not specified - PPO

## 2013-02-11 ENCOUNTER — Ambulatory Visit (INDEPENDENT_AMBULATORY_CARE_PROVIDER_SITE_OTHER): Payer: Federal, State, Local not specified - PPO | Admitting: Internal Medicine

## 2013-02-11 ENCOUNTER — Encounter: Payer: Self-pay | Admitting: Internal Medicine

## 2013-02-11 VITALS — BP 136/78 | HR 65 | Temp 97.0°F | Wt 244.8 lb

## 2013-02-11 DIAGNOSIS — I10 Essential (primary) hypertension: Secondary | ICD-10-CM

## 2013-02-11 DIAGNOSIS — E042 Nontoxic multinodular goiter: Secondary | ICD-10-CM

## 2013-02-11 DIAGNOSIS — E559 Vitamin D deficiency, unspecified: Secondary | ICD-10-CM

## 2013-02-11 DIAGNOSIS — M1710 Unilateral primary osteoarthritis, unspecified knee: Secondary | ICD-10-CM

## 2013-02-11 DIAGNOSIS — Z23 Encounter for immunization: Secondary | ICD-10-CM

## 2013-02-11 DIAGNOSIS — D649 Anemia, unspecified: Secondary | ICD-10-CM

## 2013-02-11 DIAGNOSIS — M1712 Unilateral primary osteoarthritis, left knee: Secondary | ICD-10-CM

## 2013-02-11 LAB — BASIC METABOLIC PANEL
CO2: 27 mEq/L (ref 19–32)
Chloride: 107 mEq/L (ref 96–112)
Glucose, Bld: 93 mg/dL (ref 70–99)
Potassium: 3.7 mEq/L (ref 3.5–5.1)
Sodium: 138 mEq/L (ref 135–145)

## 2013-02-11 LAB — LIPID PANEL
HDL: 53.8 mg/dL (ref >39.00)
LDL Cholesterol: 111 mg/dL — ABNORMAL HIGH (ref 0–99)
Total CHOL/HDL Ratio: 3
Triglycerides: 107 mg/dL (ref 0.0–149.0)
VLDL: 21.4 mg/dL (ref 0.0–40.0)

## 2013-02-11 LAB — CBC WITH DIFFERENTIAL/PLATELET
Eosinophils Relative: 3.2 % (ref 0.0–5.0)
HCT: 34.3 % — ABNORMAL LOW (ref 36.0–46.0)
Lymphocytes Relative: 26.7 % (ref 12.0–46.0)
Lymphs Abs: 1.4 10*3/uL (ref 0.7–4.0)
Monocytes Relative: 13.5 % — ABNORMAL HIGH (ref 3.0–12.0)
Neutrophils Relative %: 56.3 % (ref 43.0–77.0)
Platelets: 294 10*3/uL (ref 150.0–400.0)
WBC: 5.1 10*3/uL (ref 4.5–10.5)

## 2013-02-11 NOTE — Assessment & Plan Note (Signed)
12/2011 bx - Hashimoto's thyroiditis, no malignancy ultrasound 01/2013: stable size solid nodules - reviewed same with pt today Follows with [facility] prn Lab Results  Component Value Date   TSH 2.70 02/12/2012

## 2013-02-11 NOTE — Assessment & Plan Note (Signed)
BP Readings from Last 3 Encounters:  02/11/13 160/90  11/20/12 113/61  11/20/12 113/61   Changed lotrel to max dose ARB+hctz  06/2011 due to $ Cough on ACEI noted The current medical regimen is generally effective (exacerbated today by coming direct fro PT s/p TKR= pain); continue present plan and medications.

## 2013-02-11 NOTE — Patient Instructions (Signed)
It was good to see you today.  We have reviewed your prior records including labs and tests today  Test(s) ordered today. Your results will be released to MyChart (or called to you) after review, usually within 72hours after test completion. If any changes need to be made, you will be notified at that same time.  Medications reviewed and updated, no changes recommended at this time.  Please schedule followup in 6 months, call sooner if problems.  

## 2013-02-11 NOTE — Assessment & Plan Note (Signed)
hx same and "shots when <30" - ?aranesp for iron def or B12 per pt - last shot spring 2012 Exacerbated by postsurgical blood loss June 2014 Check CBC now - Lab Results  Component Value Date   FERRITIN 247.6 08/14/2011   Lab Results  Component Value Date   VITAMINB12 331 08/14/2011

## 2013-02-11 NOTE — Progress Notes (Signed)
Subjective:    Patient ID: Frances Gray, female    DOB: [birth year], 62 y.o.   MRN: [id number]  HPI patient is here today for follow up - reviewed chronic medical issues today  HTN - the patient reports compliance with medication(s) as prescribed. Denies adverse side effects. changed Lotrel to generic ARB/hct 06/2011 due to $ concerns - no chest pain, headache or edema  Kidney stones - recurrent events summer-fall 2012 since moving to [place]. S/p lithotripsy for R ureter tone 08/2011 - imporved, following with uro prn, no recurrence since 3/13  osteoarthritis - B knee -hx scope in [place] for same 2006, then est with local ortho 07/2011 ([facility]) - L TKR 11/2012 reviewed - takes oxy and OTC aleve for control of pain symptoms - no swelling but previously told needs partial knee replacement -   Thyroiditis - Hashimoto's type on bx 12/2011 for eval of multinodular goiter ([facility])  Past Medical History  Diagnosis Date  . Arthritis     R knee  . GERD (gastroesophageal reflux disease)   . Allergic rhinitis, seasonal   . History of chicken pox   . Diverticulitis   . Headaches, cluster   . History of colon polyps   . Heart murmur     no symptoms  . Anemia     has had anemia in the past. none now  . 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 Vitamin D deficiency disease 08/14/2011  . Goiter, nodular     01/01/12 bx: Hashimoto's thyroiditis, no malignancy  . Hypertension   . Osteoarthritis of left knee 07/03/2011  . Severe obesity (BMI >= 40) 11/16/2012    History  Substance Use Topics  . Smoking status: Former Smoker -- 0.25 packs/day for 10 years    Quit date: 06/10/1980  . Smokeless tobacco: Never Used  . 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  Respiratory: Negative for cough and  shortness of breath.   Cardiovascular: Negative for chest pain, palpitations and leg swelling.  Musculoskeletal: Positive for joint swelling (R>L knee (s/p TKR on L 6/14)).        Objective:   Physical Exam BP 160/90  Pulse 65  Temp(Src) 97 F (36.1 C) (Oral)  Wt 244 lb 12 oz (111.018 kg)  BMI 41.99 kg/m2  SpO2 98% Wt Readings from Last 3 Encounters:  02/11/13 244 lb 12 oz (111.018 kg)  11/19/12 250 lb 14.1 oz (113.8 kg)  11/19/12 250 lb 14.1 oz (113.8 kg)   Constitutional: She is obese but appears well-developed and well-nourished. No distress.  Neck: Normal range of motion. Neck supple. No JVD present. No thyromegaly or tenderness present.  Cardiovascular: Normal rate, regular rhythm and normal heart sounds.  No murmur heard. No BLE edema. Pulmonary/Chest: Effort normal and breath sounds normal. No respiratory distress. She has no wheezes.  Musculoskeletal: L knee s/p TKR - scar with mild keloid - R knee - boggy synovitis - tender to palpation over joint line; FROM and ligamentous function intact - otherwise, normal range of motion, no joint effusions. No gross deformities Psychiatric: She has a normal mood and affect. Her behavior is normal. Judgment and thought content normal.   Lab Results  Component Value Date   WBC 11.6* 11/19/2012   HGB 9.8* 11/19/2012   HCT 28.6*  11/19/2012   PLT 273 11/19/2012   GLUCOSE 122* 11/17/2012   CHOL 192 02/12/2012   TRIG 91.0 02/12/2012   HDL 55.10 02/12/2012   LDLCALC 119* 02/12/2012   ALT 17 02/12/2012   AST 18 02/12/2012   NA 133* 11/17/2012   K 3.3* 11/17/2012   CL 97 11/17/2012   CREATININE 0.68 11/17/2012   BUN 11 11/17/2012   CO2 30 11/17/2012   TSH 2.70 02/12/2012   INR 1.04 11/10/2012       Assessment & Plan:   see problem list. Medications and labs reviewed today.

## 2013-02-11 NOTE — Assessment & Plan Note (Signed)
On OTC meds - recheck same now at pt request DEXA  Ordered 08/2012 but not yet done

## 2013-02-11 NOTE — Assessment & Plan Note (Signed)
Hx same in [place] - s/p synvisc injection x 5 spring 2012 Now with local ortho ([facility]) s/p L TKR 11/2012 Continue ongoing pain management and physical therapy, reviewed same today

## 2013-02-12 LAB — VITAMIN D 25 HYDROXY (VIT D DEFICIENCY, FRACTURES): Vit D, 25-Hydroxy: 36 ng/mL (ref 30–89)

## 2013-02-12 NOTE — Progress Notes (Signed)
Quick Note:  Notified pt with md response...lmb ______

## 2013-03-02 HISTORY — PX: OTHER SURGICAL HISTORY: SHX169

## 2013-03-03 ENCOUNTER — Encounter: Payer: Self-pay | Admitting: Internal Medicine

## 2013-03-15 ENCOUNTER — Encounter: Payer: Self-pay | Admitting: Internal Medicine

## 2013-03-18 ENCOUNTER — Encounter: Payer: Self-pay | Admitting: Internal Medicine

## 2013-03-18 ENCOUNTER — Ambulatory Visit (INDEPENDENT_AMBULATORY_CARE_PROVIDER_SITE_OTHER)
Admission: RE | Admit: 2013-03-18 | Discharge: 2013-03-18 | Disposition: A | Discharge status: Home / Self Care | Payer: Federal, State, Local not specified - PPO | Source: Ambulatory Visit | Attending: Internal Medicine | Admitting: Internal Medicine

## 2013-03-18 ENCOUNTER — Ambulatory Visit (INDEPENDENT_AMBULATORY_CARE_PROVIDER_SITE_OTHER): Payer: Federal, State, Local not specified - PPO | Admitting: Internal Medicine

## 2013-03-18 VITALS — BP 124/82 | HR 61 | Temp 98.6°F | Wt 244.8 lb

## 2013-03-18 DIAGNOSIS — IMO0001 Reserved for inherently not codable concepts without codable children: Secondary | ICD-10-CM

## 2013-03-18 DIAGNOSIS — M545 Low back pain: Secondary | ICD-10-CM

## 2013-03-18 DIAGNOSIS — M7918 Myalgia, other site: Secondary | ICD-10-CM

## 2013-03-18 MED ORDER — CYCLOBENZAPRINE HCL 5 MG PO TABS: 5.0000 mg | ORAL_TABLET | Freq: Three times a day (TID) | ORAL | Status: DC | PRN

## 2013-03-18 MED ORDER — PREDNISONE (PAK) 10 MG PO TABS: 10.0000 mg | ORAL_TABLET | ORAL | Status: DC

## 2013-03-18 NOTE — Assessment & Plan Note (Signed)
Wt Readings from Last 3 Encounters:  03/18/13 244 lb 12.8 oz (111.041 kg)  02/11/13 244 lb 12 oz (111.018 kg)  11/19/12 250 lb 14.1 oz (113.8 kg)   The patient is asked to make an attempt to improve diet and exercise patterns to aid in medical management of this problem.

## 2013-03-18 NOTE — Patient Instructions (Addendum)
It was good to see you today. We have reviewed your prior records including labs and tests today Test(s) ordered today. Your results will be released to MyChart (or called to you) after review, usually within 72hours after test completion. If any changes need to be made, you will be notified at that same time. Prednisone taper x 6 days and Flexeril muscle relaxer at night and as needed for muscle spasm pain Your prescription(s) have been submitted to your pharmacy. Please take as directed and contact our office if you believe you are having problem(s) with the medication(s). Other Medications reviewed and updated followup in 2 weeks if symptoms unimproved, call sooner if worse  Back Exercises Back exercises help treat and prevent back injuries. The goal is to increase your strength in your belly (abdominal) and back muscles. These exercises can also help with flexibility. Start these exercises when told by your doctor. HOME CARE Back exercises include: Pelvic Tilt.  Lie on your back with your knees bent. Tilt your pelvis until the lower part of your back is against the floor. Hold this position 5 to 10 sec. Repeat this exercise 5 to 10 times. Knee to Chest.  Pull 1 knee up against your chest and hold for 20 to 30 seconds. Repeat this with the other knee. This may be done with the other leg straight or bent, whichever feels better. Then, pull both knees up against your chest. Sit-Ups or Curl-Ups.  Bend your knees 90 degrees. Start with tilting your pelvis, and do a partial, slow sit-up. Only lift your upper half 30 to 45 degrees off the floor. Take at least 2 to 3 seonds for each sit-up. Do not do sit-ups with your knees out straight. If partial sit-ups are difficult, simply do the above but with only tightening your belly (abdominal) muscles and holding it as told. Hip-Lift.  Lie on your back with your knees flexed 90 degrees. Push down with your feet and shoulders as you raise your hips 2  inches off the floor. Hold for 10 seconds, repeat 5 to 10 times. Back Arches.  Lie on your stomach. Prop yourself up on bent elbows. Slowly press on your hands, causing an arch in your low back. Repeat 3 to 5 times. Shoulder-Lifts.  Lie face down with arms beside your body. Keep hips and belly pressed to floor as you slowly lift your head and shoulders off the floor. Do not overdo your exercises. Be careful in the beginning. Exercises may cause you some mild back discomfort. If the pain lasts for more than 15 minutes, stop the exercises until you see your doctor. Improvement with exercise for back problems is slow.  Document Released: 06/29/2010 Document Revised: 08/19/2011 Document Reviewed: 03/28/2011 [facility] Patient Information 2014 [place], [place].

## 2013-03-18 NOTE — Progress Notes (Signed)
Subjective:    Patient ID: Frances Gray, female    DOB: 28-Jan-1951, 62 y.o.   MRN: [id number]  HPI patient is here today for R buttock pain Onset 5 days ago, intermittently present Describes as boring sharp stabbing cramp-like pain deep in right buttock Denies trauma -specifically no falls, direct injury or recent MVA Denies bruising or rash Associated with low back pain right greater than left side Denies precipitating overuse or history of same pain Pain symptoms not improved with Robaxin or oxycodone, mild relief with Aleve yesterday  Past Medical History  Diagnosis Date  . Arthritis     R knee  . GERD (gastroesophageal reflux disease)   . Allergic rhinitis, seasonal   . History of chicken pox   . Diverticulitis   . Headaches, cluster   . History of colon polyps   . Heart murmur     no symptoms  . Anemia     has had anemia in the past. none now  . 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 Vitamin D deficiency disease 08/14/2011  . Goiter, nodular     01/01/12 bx: Hashimoto's thyroiditis, no malignancy  . Hypertension   . Osteoarthritis of left knee 07/03/2011  . Severe obesity (BMI >= 40) 11/16/2012    History  Substance Use Topics  . Smoking status: Former Smoker -- 0.25 packs/day for 10 years    Quit date: 06/10/1980  . Smokeless tobacco: Never Used  . 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  Respiratory: Negative for cough and shortness of breath.   Cardiovascular: Negative for chest pain, palpitations and leg swelling.  Musculoskeletal: Positive for joint swelling (R>L knee (s/p TKR on L 6/14)).        Objective:   Physical Exam BP 124/82  Pulse 61  Temp(Src) 98.6 F (37 C) (Oral)  Wt 244 lb 12.8 oz (111.041 kg)  BMI 42 kg/m2  SpO2 95% Wt Readings  from Last 3 Encounters:  03/18/13 244 lb 12.8 oz (111.041 kg)  02/11/13 244 lb 12 oz (111.018 kg)  11/19/12 250 lb 14.1 oz (113.8 kg)   Constitutional: She is obese but appears well-developed and well-nourished. No distress.  Neck: Normal range of motion. Neck supple. No JVD present. No thyromegaly or tenderness present.  Cardiovascular: Normal rate, regular rhythm and normal heart sounds.  No murmur heard. No BLE edema. Pulmonary/Chest: Effort normal and breath sounds normal. No respiratory distress. She has no wheezes.  Musculoskeletal: Back: full range of motion of thoracic and lumbar spine. Non tender to palpation. Negative straight leg raise. DTR's are symmetrically intact. Sensation intact in all dermatomes of the lower extremities. Full strength to manual muscle testing. patient is able to heel toe walk with difficulty and ambulates with antalgic gait. L knee s/p TKR - scar with mild keloid - R knee - boggy synovitis - tender to palpation over joint line; FROM and ligamentous function intact - otherwise, normal range of motion, no joint effusions. No gross deformities Psychiatric: She has a normal mood and affect. Her behavior is normal. Judgment and thought content normal.   Lab Results  Component Value Date   WBC 5.1 02/11/2013   HGB 11.3* 02/11/2013   HCT 34.3* 02/11/2013   PLT 294.0 02/11/2013   GLUCOSE 93 02/11/2013   CHOL 186  02/11/2013   TRIG 107.0 02/11/2013   HDL 53.80 02/11/2013   LDLCALC 111* 02/11/2013   ALT 17 02/12/2012   AST 18 02/12/2012   NA 138 02/11/2013   K 3.7 02/11/2013   CL 107 02/11/2013   CREATININE 0.5 02/11/2013   BUN 11 02/11/2013   CO2 27 02/11/2013   TSH 2.70 02/12/2012   INR 1.04 11/10/2012       Assessment & Plan:   Right buttock pain.  Suspect lumbar radicular in nature has no history of trauma, no abnormality on hip or pelvic exam.   Check DG lumbar spine today to evaluate for DDD Treat with prednisone burst x6 days Flexeril muscle relaxer each bedtime and as  needed Back exercises provided Education reassurance provided Patient call if symptoms unimproved in next 2 weeks, sooner if worse

## 2013-05-21 IMAGING — US US THYROID BIOPSY
1 series · 13 of 22 positions shown · non-contrast
Comparison: 12/17/2011
COMPARISON: 12/17/2011

CLINICAL DATA: Dominant right mid thyroid nodule

ULTRASOUND GUIDED NEEDLE ASPIRATE BIOPSY OF THE THYROID GLAND
CLINICAL DATA: Dominant left isthmus nodule

[Series 1: us thyroid biopsy · 0.07mm/px · 22 acquisitions, 13 frames shown]
[im 1/22]
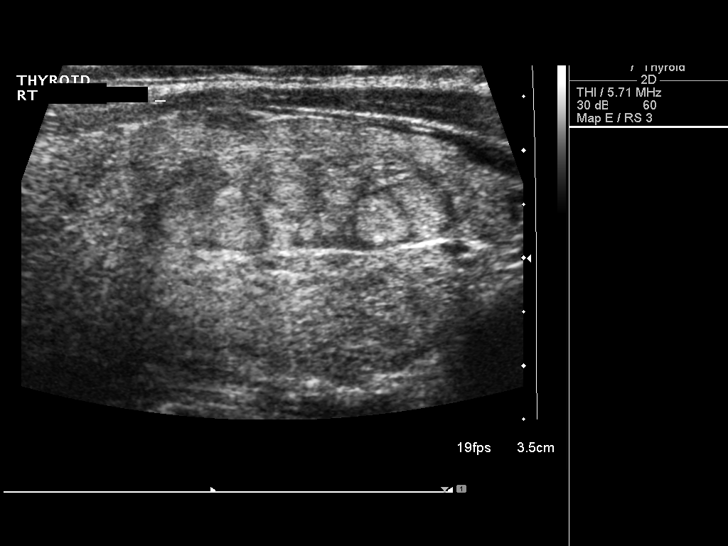
[im 3/22]
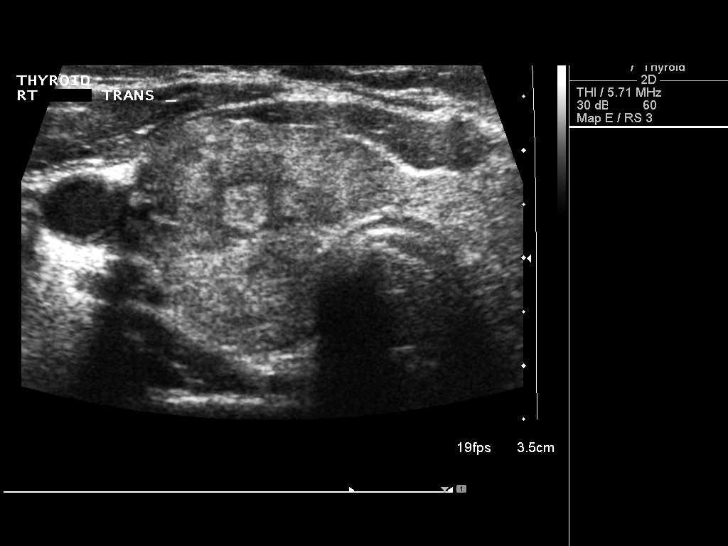
[im 5/22]
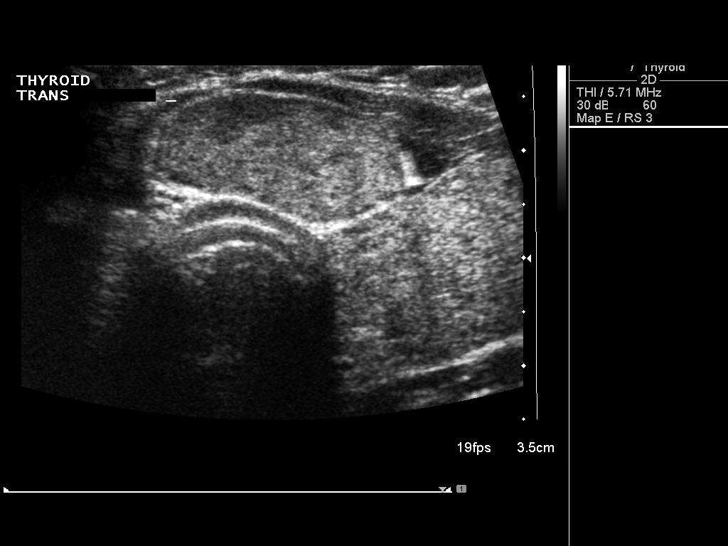
[im 6/22]
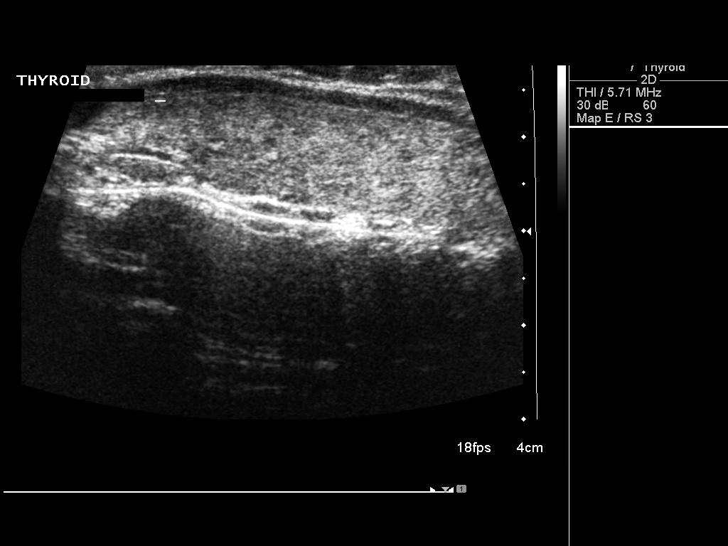
[im 8/22]
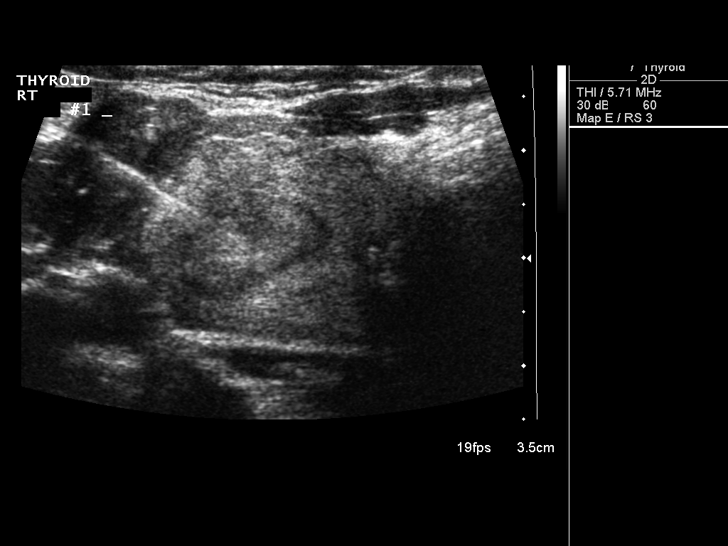
[im 10/22]
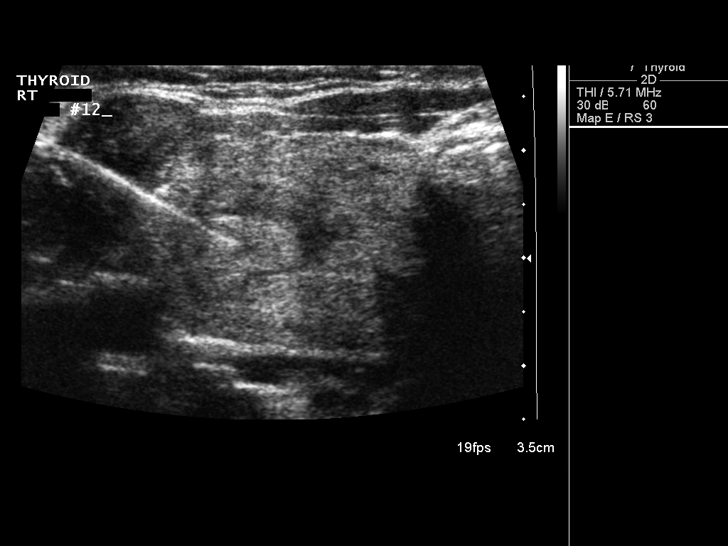
[im 12/22]
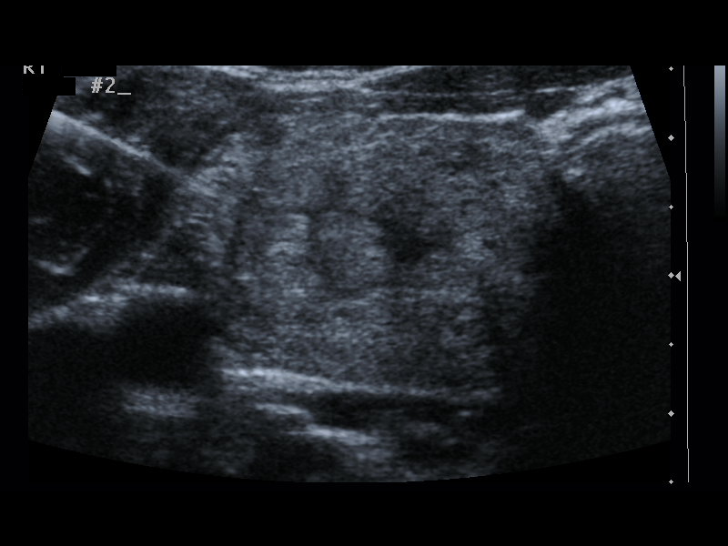
[im 13/22]
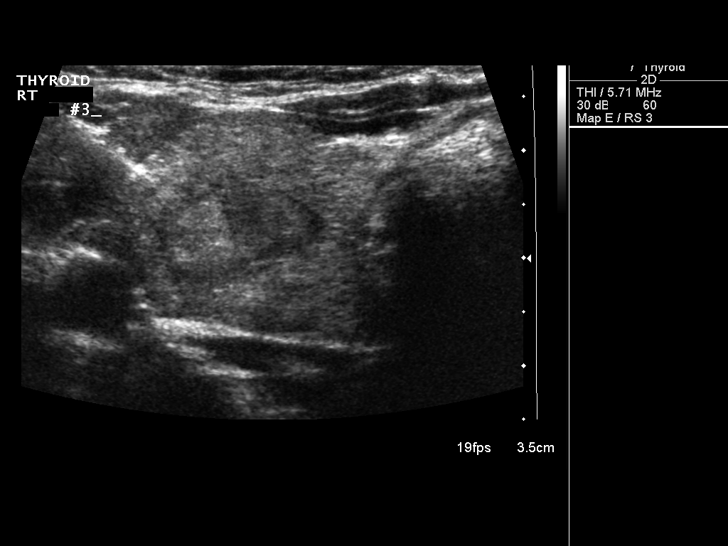
[im 15/22]
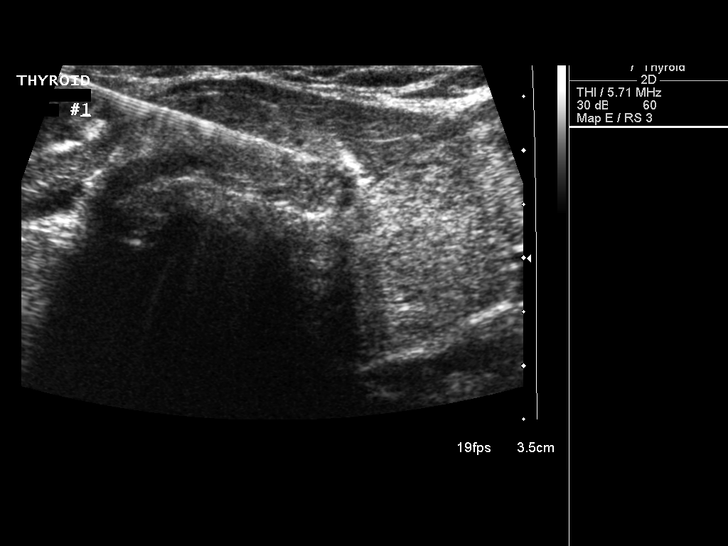
[im 17/22]
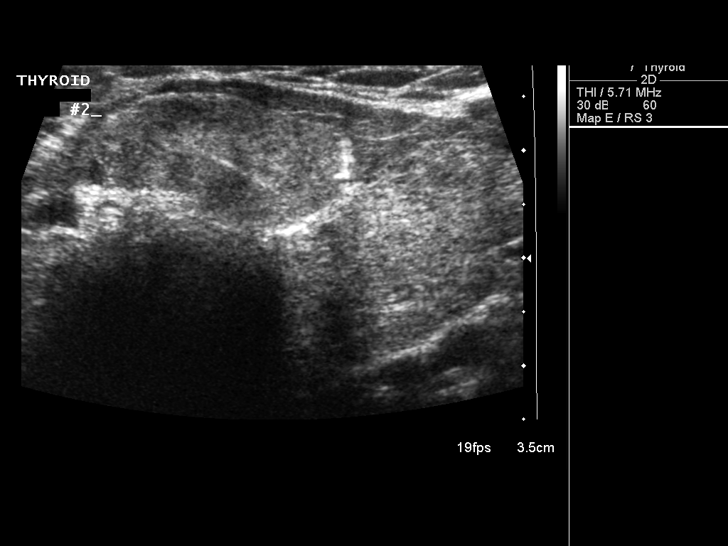
[im 18/22]
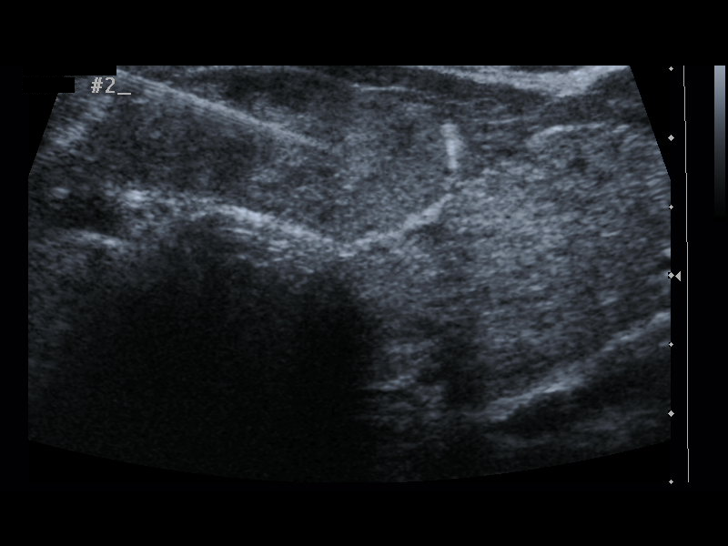
[im 20/22]
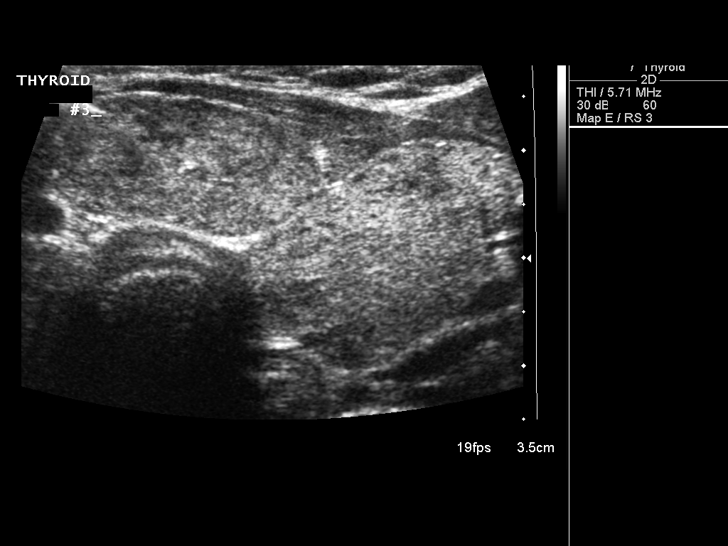
[im 22/22]
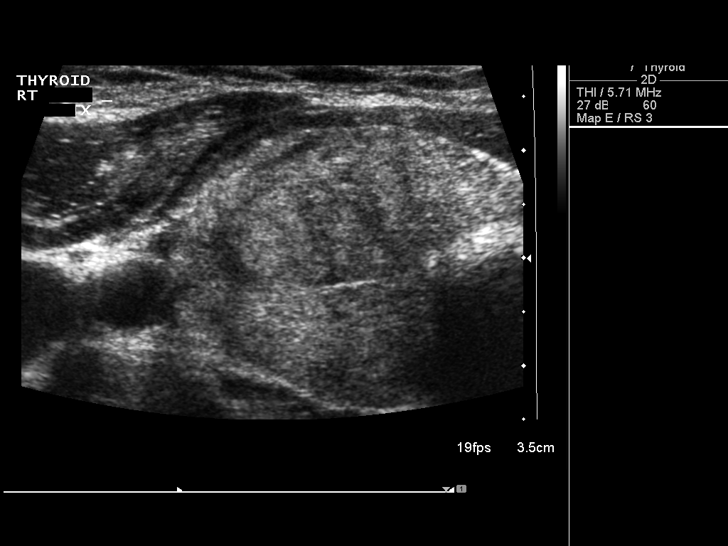

[13 of 22 positions shown; findings below may reference images not displayed]

Thyroid biopsy was thoroughly discussed with the patient and
questions were answered.  The benefits, risks, alternatives, and
complications were also discussed.  The patient understands and
wishes to proceed with the procedure.  Written consent was
obtained.

Ultrasound was performed to localize and mark an adequate site for
the biopsy.  The patient was then prepped and draped in a normal
sterile fashion.  Local anesthesia was provided with 1% lidocaine.
Using direct ultrasound guidance, 3 passes were made using 25 gauge
needles into the nodule within the right lobe of the thyroid.
Ultrasound was used to confirm needle placements on all occasions.
Specimens were sent to Pathology for analysis.

Complications:  No immediate
FINDINGS: Imaging confirms needle placed in the dominant right mid
thyroid nodule
IMPRESSION: Ultrasound guided needle aspirate biopsy performed of the dominant
right mid thyroid nodule.
Thyroid biopsy was thoroughly discussed with the patient and
questions were answered.  The benefits, risks, alternatives, and
complications were also discussed.  The patient understands and
wishes to proceed with the procedure.  Written consent was
obtained.

Ultrasound was performed to localize and mark an adequate site for
the biopsy.  The patient was then prepped and draped in a normal
sterile fashion.  Local anesthesia was provided with 1% lidocaine.
Using direct ultrasound guidance, 3 passes were made using 25 gauge
needles into the nodule within the isthmus lobe of the thyroid.
Ultrasound was used to confirm needle placements on all occasions.
Specimens were sent to Pathology for analysis.

Complications:  No immediate
FINDINGS: Imaging confirms needle placed in the dominant left
isthmus nodule
IMPRESSION: Ultrasound guided needle aspirate biopsy performed of the dominant
left isthmus thyroid nodule.

## 2013-08-11 ENCOUNTER — Ambulatory Visit: Payer: Federal, State, Local not specified - PPO | Admitting: Internal Medicine

## 2013-08-20 ENCOUNTER — Encounter: Payer: Self-pay | Admitting: Internal Medicine

## 2013-08-20 ENCOUNTER — Other Ambulatory Visit (INDEPENDENT_AMBULATORY_CARE_PROVIDER_SITE_OTHER): Payer: Federal, State, Local not specified - PPO

## 2013-08-20 ENCOUNTER — Ambulatory Visit (INDEPENDENT_AMBULATORY_CARE_PROVIDER_SITE_OTHER): Payer: Federal, State, Local not specified - PPO | Admitting: Internal Medicine

## 2013-08-20 VITALS — BP 132/80 | HR 57 | Temp 97.0°F | Wt 255.8 lb

## 2013-08-20 DIAGNOSIS — I10 Essential (primary) hypertension: Secondary | ICD-10-CM

## 2013-08-20 DIAGNOSIS — M899 Disorder of bone, unspecified: Secondary | ICD-10-CM

## 2013-08-20 DIAGNOSIS — H539 Unspecified visual disturbance: Secondary | ICD-10-CM

## 2013-08-20 DIAGNOSIS — E042 Nontoxic multinodular goiter: Secondary | ICD-10-CM

## 2013-08-20 DIAGNOSIS — Z1211 Encounter for screening for malignant neoplasm of colon: Secondary | ICD-10-CM

## 2013-08-20 DIAGNOSIS — M949 Disorder of cartilage, unspecified: Secondary | ICD-10-CM

## 2013-08-20 DIAGNOSIS — Z1239 Encounter for other screening for malignant neoplasm of breast: Secondary | ICD-10-CM

## 2013-08-20 DIAGNOSIS — M8580 Other specified disorders of bone density and structure, unspecified site: Secondary | ICD-10-CM

## 2013-08-20 LAB — BASIC METABOLIC PANEL
BUN: 16 mg/dL (ref 6–23)
CHLORIDE: 101 meq/L (ref 96–112)
CO2: 30 meq/L (ref 19–32)
Calcium: 9.6 mg/dL (ref 8.4–10.5)
Creatinine, Ser: 0.6 mg/dL (ref 0.4–1.2)
GFR: 122.46 mL/min (ref >60.00)
GLUCOSE: 84 mg/dL (ref 70–99)
POTASSIUM: 3.9 meq/L (ref 3.5–5.1)
SODIUM: 137 meq/L (ref 135–145)

## 2013-08-20 LAB — URINALYSIS, ROUTINE W REFLEX MICROSCOPIC
Bilirubin Urine: NEGATIVE
Hgb urine dipstick: NEGATIVE
KETONES UR: NEGATIVE
LEUKOCYTES UA: NEGATIVE
Nitrite: NEGATIVE
PH: 6.5 (ref 5.0–8.0)
RBC / HPF: NONE SEEN (ref 0–?)
SPECIFIC GRAVITY, URINE: 1.02 (ref 1.000–1.030)
TOTAL PROTEIN, URINE-UPE24: NEGATIVE
URINE GLUCOSE: NEGATIVE
Urobilinogen, UA: 0.2 (ref 0.0–1.0)

## 2013-08-20 LAB — TSH: TSH: 3.76 u[IU]/mL (ref 0.35–5.50)

## 2013-08-20 NOTE — Progress Notes (Signed)
Pre visit review using our clinic review tool, if applicable. No additional management support is needed unless otherwise documented below in the visit note. 

## 2013-08-20 NOTE — Progress Notes (Signed)
Frances Gray [id number] 08/20/2013   Chief Complaint  Patient presents with  . Follow-up    6 months    Subjective  HPI Comments: Here today for 6 month follow up. Chronic medical issues and interval medical events discussed.  She has been compliant with all prescribed medications with desired response and without adverse reactions.  HTN -  changed Lotrel to generic ARB/hct 06/2011 due to $ concerns - compliant w/o complications.  Kidney stones - Recurrent events summer-fall 2012 since moving to [place]. S/p lithotripsy for R ureter tone 08/2011 - imporved, following with uro prn, no recurrence since 3/13.  Saw endocrine  after knee surgery.  Needs to f/u.  Was told to come back in one year.   Osteoarthritis - B knee -hx scope in [place] for same 2006, then est with local ortho 07/2011 ([facility]) - L TKR 11/2012 reviewed - takes oxy and OTC aleve for control of pain symptoms - no swelling but previously told needs partial knee replacement - Last visit in Aug/Sept of 2014.  Returning in 1 year for follow up.  Eyes - has been a couple years since last visit.  She will find their info and make contact.      Past Medical History  Diagnosis Date  . Arthritis     R knee  . GERD (gastroesophageal reflux disease)   . Allergic rhinitis, seasonal   . History of chicken pox   . Diverticulitis   . Headaches, cluster   . History of colon polyps   . Heart murmur     no symptoms  . Anemia     has had anemia in the past. none now  . 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 Vitamin D deficiency disease 08/14/2011  . Goiter, nodular     01/01/12 bx: Hashimoto's thyroiditis, no malignancy  . Hypertension   . Osteoarthritis of left knee 07/03/2011  . Severe obesity (BMI >= 40) 11/16/2012    Past Surgical History  Procedure Laterality Date  . Breast biopsy    . Knee arthroscopy      bilateral  . Esophagus surgery      tumor removed   (benign)  .  Lithotripsy  08/2011    R ureteral stone  . Total knee arthroplasty Left 11/16/2012    Procedure: TOTAL KNEE ARTHROPLASTY- left;  Surgeon: Johnny Bridge, MD;  Location: [facility];  Service: Orthopedics;  Laterality: Left;  . Breast ultrasound Right 03/02/13    The 20mm complex cyst in the right breast is probably benign. Follow-up in 6 months with bilateral mammogram and (R) ultrasound    Family History  Problem Relation Age of Onset  . Ovarian cancer Mother   . Cancer Mother     Ovarian Cancer  . Colon cancer Other   . Hypertension Other   . Hyperlipidemia Other   . Heart disease Other   . Breast cancer Other   . Cancer Sister     Breast & Colon Cancer    History  Substance Use Topics  . Smoking status: Former Smoker -- 0.25 packs/day for 10 years    Quit date: 06/10/1980  . Smokeless tobacco: Never Used  . Alcohol Use: No    Current Outpatient Prescriptions on File Prior to Visit  Medication Sig Dispense Refill  . Biotin (BIOTIN 5000) 5 MG CAPS Take 5 mg by mouth every morning.       . Cholecalciferol (  VITAMIN D3) 2000 UNITS TABS Take 2,000 Units by mouth every morning.       Marland Kitchen losartan-hydrochlorothiazide (HYZAAR) 100-25 MG per tablet TAKE 1 TABLET BY MOUTH EVERY DAY  90 tablet  1  . Multiple Vitamin (MULTIVITAMIN) tablet Take 1 tablet by mouth every morning.        No current facility-administered medications on file prior to visit.    Allergies:  Allergies  Allergen Reactions  . Amlodipine Cough    Review of Systems  Constitutional: Negative for fever, chills, weight loss, malaise/fatigue and diaphoresis.  HENT: Negative for hearing loss.   Eyes:       Some decrease in resolution.  She will f/u w/ ophthalmology.  Respiratory: Negative for shortness of breath and wheezing.   Cardiovascular: Negative for chest pain and palpitations.  Gastrointestinal: Negative for nausea, vomiting, diarrhea, constipation and blood in stool.  Musculoskeletal: Negative for joint pain  and myalgias.  Neurological: Negative for dizziness and headaches.  Psychiatric/Behavioral: Negative for depression and suicidal ideas.     Objective  Filed Vitals:   08/20/13 1133  BP: 132/80  Pulse: 57  Temp: 97 F (36.1 C)  TempSrc: Oral  Weight: 255 lb 12.8 oz (116.03 kg)  SpO2: 98%    Physical Exam  Constitutional: She is oriented to person, place, and time. She appears well-developed and well-nourished. No distress.  Overweight, Nontoxic  HENT:  Head: Normocephalic and atraumatic.  Mouth/Throat: Oropharynx is clear and moist. No oropharyngeal exudate.  Ears: TMs intact b/l, no erythema or effusion  Eyes: Conjunctivae and EOM are normal. Pupils are equal, round, and reactive to light. No scleral icterus.  Neck: Normal range of motion. Neck supple. No JVD present.  Cardiovascular: Normal rate and regular rhythm.  Exam reveals no gallop and no friction rub.   No murmur heard. No edema b/l   Respiratory: Effort normal and breath sounds normal. No stridor. No respiratory distress. She has no wheezes. She has no rales.  Breaths shallow.  GI: Soft. Bowel sounds are normal. There is no tenderness. There is no rebound and no guarding.  Genitourinary:  Deferred  Musculoskeletal: Normal range of motion. She exhibits no edema.  Lymphadenopathy:    She has no cervical adenopathy.  Neurological: She is alert and oriented to person, place, and time.  Skin: She is not diaphoretic.  Psychiatric: She has a normal mood and affect. Her behavior is normal. Judgment and thought content normal.    BP Readings from Last 3 Encounters:  08/20/13 132/80  03/18/13 124/82  02/11/13 136/78    Wt Readings from Last 3 Encounters:  08/20/13 255 lb 12.8 oz (116.03 kg)  03/18/13 244 lb 12.8 oz (111.041 kg)  02/11/13 244 lb 12 oz (111.018 kg)    Lab Results  Component Value Date   WBC 5.1 02/11/2013   HGB 11.3* 02/11/2013   HCT 34.3* 02/11/2013   PLT 294.0 02/11/2013   GLUCOSE 93 02/11/2013    CHOL 186 02/11/2013   TRIG 107.0 02/11/2013   HDL 53.80 02/11/2013   LDLCALC 111* 02/11/2013   ALT 17 02/12/2012   AST 18 02/12/2012   NA 138 02/11/2013   K 3.7 02/11/2013   CL 107 02/11/2013   CREATININE 0.5 02/11/2013   BUN 11 02/11/2013   CO2 27 02/11/2013   TSH 2.70 02/12/2012   INR 1.04 11/10/2012    Dg Lumbar Spine 2-3 Views  03/18/2013   CLINICAL DATA:  Low back pain extending into right buttock for the  past week. No known injury.  EXAM: LUMBAR SPINE - 2-3 VIEW  COMPARISON:  09/18/2011 plain film examination of the abdomen.  FINDINGS: Mild L3-4 disc space narrowing.  Mild L4-5 facet joint degenerative changes. Very minimal anterior slippage of L4.  IMPRESSION: Mild L3-4 disc space narrowing.  Mild L4-5 facet joint degenerative changes. Very minimal anterior slippage of L4.   Electronically Signed   By: Chauncey Cruel M.D.   On: 03/18/2013 12:58     Assessment and Plan  HTN -  Compliant w/o complications.  Continue medications for same.  Kidney stones - No recurrence since 3/13.  Continue to follow with uro prn.  Osteoarthritis - Knee (L) discomfort stable.  Last visit with Ortho in Aug/Sept of 2014.  Returning in 1 year for follow up.  Change in vision - has been a couple years since last visit.  Refer to ophthalmology today.  Patient was instructed to notify the office if any new symptoms emerged or the current symptoms become worse.   Return in about 6 months (around 02/20/2014) for annual exam and labs.  Wynelle Cleveland   I have personally reviewed this case with PA student. I also personally examined this patient. I agree with history and findings as documented above. I reviewed, discussed and approve of the assessment and plan as listed above. Gwendolyn Grant, MD

## 2013-08-20 NOTE — Patient Instructions (Addendum)
It was good to see you today.  We have reviewed your prior records including labs and tests today  Test(s) ordered today. Your results will be released to [place] (or called to you) after review, usually within 72hours after test completion. If any changes need to be made, you will be notified at that same time.  Medications reviewed and updated, no changes recommended at this time.  we'll make referral to vision specialist to evaluate your change in vision. Also to gastroenterology for colonoscopy July 2015 as due . Our office will contact you regarding appointment(s) once made.  We'll schedule bone density as discussed  Please schedule followup in 6 months for your annual exam/labs, call sooner if problems.

## 2013-08-20 NOTE — Assessment & Plan Note (Signed)
BP Readings from Last 3 Encounters:  08/20/13 132/80  03/18/13 124/82  02/11/13 136/78   Changed lotrel to max dose ARB+hctz  06/2011 due to $ Cough hx with ACEI reviewed The current medical regimen is generally effective; continue present plan and medications.

## 2013-08-20 NOTE — Assessment & Plan Note (Signed)
12/2011 bx - Hashimoto's thyroiditis, no malignancy ultrasound 01/2013: stable size solid nodules - reviewed same Follows with [facility] prn Lab Results  Component Value Date   TSH 2.70 02/12/2012

## 2013-08-20 NOTE — Progress Notes (Signed)
Subjective:    Patient ID: Frances Gray, female    DOB: [birth year], 63 y.o.   MRN: [id number]  HPI patient is here today for follow up - reviewed chronic medical issues today:   Past Medical History  Diagnosis Date  . Arthritis     R knee  . GERD (gastroesophageal reflux disease)   . Allergic rhinitis, seasonal   . History of chicken pox   . Diverticulitis   . Headaches, cluster   . History of colon polyps   . Heart murmur     no symptoms  . Anemia     has had anemia in the past. none now  . 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 Vitamin D deficiency disease 08/14/2011  . Goiter, nodular     01/01/12 bx: Hashimoto's thyroiditis, no malignancy  . Hypertension   . Osteoarthritis of left knee 07/03/2011  . Severe obesity (BMI >= 40) 11/16/2012    Review of Systems  Constitutional: Negative for fever, fatigue and unexpected weight change.  Respiratory: Negative for cough and shortness of breath.   Cardiovascular: Negative for chest pain, palpitations and leg swelling.  Musculoskeletal: Positive for joint swelling (R>L knee (s/p TKR on L 6/14)).        Objective:   Physical Exam BP 132/80  Pulse 57  Temp(Src) 97 F (36.1 C) (Oral)  Wt 255 lb 12.8 oz (116.03 kg)  SpO2 98% Wt Readings from Last 3 Encounters:  08/20/13 255 lb 12.8 oz (116.03 kg)  03/18/13 244 lb 12.8 oz (111.041 kg)  02/11/13 244 lb 12 oz (111.018 kg)   Constitutional: She is obese but appears well-developed and well-nourished. No distress.  Neck: Normal range of motion. Neck supple. No JVD present. No thyromegaly or tenderness present.  Cardiovascular: Normal rate, regular rhythm and normal heart sounds.  No murmur heard. No BLE edema. Pulmonary/Chest: Effort normal and breath sounds normal. No respiratory distress. She has no wheezes.  Musculoskeletal: L knee s/p TKR; R knee - boggy synovitis - tender to palpation over joint line; FROM and ligamentous function  intact - otherwise, normal range of motion, no joint effusions. No gross deformities Psychiatric: She has a normal mood and affect. Her behavior is normal. Judgment and thought content normal.   Lab Results  Component Value Date   WBC 5.1 02/11/2013   HGB 11.3* 02/11/2013   HCT 34.3* 02/11/2013   PLT 294.0 02/11/2013   GLUCOSE 93 02/11/2013   CHOL 186 02/11/2013   TRIG 107.0 02/11/2013   HDL 53.80 02/11/2013   LDLCALC 111* 02/11/2013   ALT 17 02/12/2012   AST 18 02/12/2012   NA 138 02/11/2013   K 3.7 02/11/2013   CL 107 02/11/2013   CREATININE 0.5 02/11/2013   BUN 11 02/11/2013   CO2 27 02/11/2013   TSH 2.70 02/12/2012   INR 1.04 11/10/2012       Assessment & Plan:   Problem List Items Addressed This Visit   Hypertension - Primary      BP Readings from Last 3 Encounters:  08/20/13 132/80  03/18/13 124/82  02/11/13 136/78   Changed lotrel to max dose ARB+hctz  06/2011 due to $ Cough hx with ACEI reviewed The current medical regimen is generally effective; continue present plan and medications.    Relevant Orders      Basic metabolic panel      Urinalysis, Routine w reflex microscopic   Multinodular goiter (nontoxic)  12/2011 bx - Hashimoto's thyroiditis, no malignancy ultrasound 01/2013: stable size solid nodules - reviewed same Follows with [facility] prn Lab Results  Component Value Date   TSH 2.70 02/12/2012      Relevant Orders      TSH    Other Visit Diagnoses   Vision changes        Relevant Orders       Ambulatory referral to Ophthalmology    Special screening for malignant neoplasms, colon        Relevant Orders       Ambulatory referral to Gastroenterology    Osteopenia        Relevant Orders       DG Bone Density    Other screening breast examination        Relevant Orders       MM DIGITAL SCREENING BILATERAL

## 2013-08-23 ENCOUNTER — Encounter: Payer: Self-pay | Admitting: Gastroenterology

## 2013-08-23 ENCOUNTER — Telehealth: Payer: Self-pay | Admitting: Internal Medicine

## 2013-08-23 NOTE — Telephone Encounter (Signed)
Relevant patient education assigned to patient using Emmi. ° °

## 2013-09-02 HISTORY — PX: OTHER SURGICAL HISTORY: SHX169

## 2013-09-02 LAB — HM MAMMOGRAPHY

## 2013-09-03 ENCOUNTER — Encounter: Payer: Self-pay | Admitting: Internal Medicine

## 2013-09-22 ENCOUNTER — Encounter: Payer: Self-pay | Admitting: Internal Medicine

## 2013-10-06 ENCOUNTER — Telehealth: Payer: Self-pay | Admitting: *Deleted

## 2013-10-06 ENCOUNTER — Ambulatory Visit (AMBULATORY_SURGERY_CENTER): Payer: Self-pay | Admitting: *Deleted

## 2013-10-06 ENCOUNTER — Encounter: Payer: Self-pay | Admitting: Gastroenterology

## 2013-10-06 VITALS — Ht 64.0 in | Wt 263.6 lb

## 2013-10-06 DIAGNOSIS — Z800 Family history of malignant neoplasm of digestive organs: Secondary | ICD-10-CM

## 2013-10-06 MED ORDER — NA SULFATE-K SULFATE-MG SULF 17.5-3.13-1.6 GM/177ML PO SOLN: ORAL | Status: DC

## 2013-10-06 NOTE — Telephone Encounter (Signed)
OK for direct colonoscopy

## 2013-10-06 NOTE — Telephone Encounter (Signed)
Noted. Thanks.

## 2013-10-06 NOTE — Progress Notes (Signed)
Patient denies any allergies to eggs or soy. Patient denies any problems with anesthesia. No oxygen use at home, no diet/wt loss pills per patient. EMMI education on colonoscopy given to patient. Patient has colonoscopy records from [facility]. Copy placed on Dr.Kaplan's desk for review.

## 2013-10-06 NOTE — Telephone Encounter (Signed)
Patient is a direct screening colonoscopy on 10/20/13. Patient has records of last colonoscopies 2004(normal) and 12/23/2008(Diverticulosis&hemorrhoids). Patient also has family history colon cancer (sister at age 64). Patient denies any GI concerns/problems at this time. Records on desk for review. Okay for colonoscopy at this time? Thanks,Rhaelyn Giron PV

## 2013-10-06 NOTE — Progress Notes (Signed)
Patient ID: Frances Gray, female   DOB: 09-01-1950, 63 y.o.   MRN: [id number] 2010 colonoscopy report was reviewed.   Findings: diverticulosis, internal hemorrhoids

## 2013-10-11 ENCOUNTER — Encounter: Payer: Self-pay | Admitting: Gastroenterology

## 2013-10-20 ENCOUNTER — Ambulatory Visit (AMBULATORY_SURGERY_CENTER): Payer: Federal, State, Local not specified - PPO | Admitting: Gastroenterology

## 2013-10-20 ENCOUNTER — Encounter: Payer: Self-pay | Admitting: Gastroenterology

## 2013-10-20 VITALS — BP 124/72 | HR 62 | Temp 96.4°F | Resp 15

## 2013-10-20 DIAGNOSIS — D126 Benign neoplasm of colon, unspecified: Secondary | ICD-10-CM

## 2013-10-20 DIAGNOSIS — Z1211 Encounter for screening for malignant neoplasm of colon: Secondary | ICD-10-CM

## 2013-10-20 DIAGNOSIS — K5730 Diverticulosis of large intestine without perforation or abscess without bleeding: Secondary | ICD-10-CM

## 2013-10-20 DIAGNOSIS — Z800 Family history of malignant neoplasm of digestive organs: Secondary | ICD-10-CM

## 2013-10-20 MED ORDER — SODIUM CHLORIDE 0.9 % IV SOLN: 500.0000 mL | INTRAVENOUS | Status: DC

## 2013-10-20 NOTE — Patient Instructions (Signed)
YOU HAD AN ENDOSCOPIC PROCEDURE TODAY AT THE  ENDOSCOPY CENTER: Refer to the procedure report that was given to you for any specific questions about what was found during the examination.  If the procedure report does not answer your questions, please call your gastroenterologist to clarify.  If you requested that your care partner not be given the details of your procedure findings, then the procedure report has been included in a sealed envelope for you to review at your convenience later.  YOU SHOULD EXPECT: Some feelings of bloating in the abdomen. Passage of more gas than usual.  Walking can help get rid of the air that was put into your GI tract during the procedure and reduce the bloating. If you had a lower endoscopy (such as a colonoscopy or flexible sigmoidoscopy) you may notice spotting of blood in your stool or on the toilet paper. If you underwent a bowel prep for your procedure, then you may not have a normal bowel movement for a few days.  DIET: Your first meal following the procedure should be a light meal and then it is ok to progress to your normal diet.  A half-sandwich or bowl of soup is an example of a good first meal.  Heavy or fried foods are harder to digest and may make you feel nauseous or bloated.  Likewise meals heavy in dairy and vegetables can cause extra gas to form and this can also increase the bloating.  Drink plenty of fluids but you should avoid alcoholic beverages for 24 hours.  ACTIVITY: Your care partner should take you home directly after the procedure.  You should plan to take it easy, moving slowly for the rest of the day.  You can resume normal activity the day after the procedure however you should NOT DRIVE or use heavy machinery for 24 hours (because of the sedation medicines used during the test).    SYMPTOMS TO REPORT IMMEDIATELY: A gastroenterologist can be reached at any hour.  During normal business hours, 8:30 AM to 5:00 PM Monday through Friday,  call [phone].  After hours and on weekends, please call the GI answering service at [phone] who will take a message and have the physician on call contact you.   Following lower endoscopy (colonoscopy or flexible sigmoidoscopy):  Excessive amounts of blood in the stool  Significant tenderness or worsening of abdominal pains  Swelling of the abdomen that is new, acute  Fever of 100F or higher  FOLLOW UP: If any biopsies were taken you will be contacted by phone or by letter within the next 1-3 weeks.  Call your gastroenterologist if you have not heard about the biopsies in 3 weeks.  Our staff will call the home number listed on your records the next business day following your procedure to check on you and address any questions or concerns that you may have at that time regarding the information given to you following your procedure. This is a courtesy call and so if there is no answer at the home number and we have not heard from you through the emergency physician on call, we will assume that you have returned to your regular daily activities without incident.  SIGNATURES/CONFIDENTIALITY: You and/or your care partner have signed paperwork which will be entered into your electronic medical record.  These signatures attest to the fact that that the information above on your After Visit Summary has been reviewed and is understood.  Full responsibility of the confidentiality of this   discharge information lies with you and/or your care-partner.  Resume medications. Information given on polyps, diverticulosis  and high fiber diet with discharge instruction.

## 2013-10-20 NOTE — Op Note (Signed)
[place]  [address] [place], [id number]   COLONOSCOPY PROCEDURE REPORT  PATIENT: Frances Gray, Frances Gray  MR#: [id number] BIRTHDATE: 1950-12-27 , 22  yrs. old GENDER: Female ENDOSCOPIST: Inda Castle, MD REFERRED PX:[id number] Asa Lente, M.D. PROCEDURE DATE:  10/20/2013 PROCEDURE:   Colonoscopy with cold biopsy polypectomy First Screening Colonoscopy - Avg.  risk and is 50 yrs.  old or older - No.  Prior Negative Screening - Now for repeat screening. 10 or more years since last screening  History of Adenoma - Now for follow-up colonoscopy & has been > or = to 3 yrs.  N/A  Polyps Removed Today? Yes. ASA CLASS:   Class II INDICATIONS:Patient's immediate family history of colon cancer. MEDICATIONS: MAC sedation, administered by CRNA and propofol (Diprivan) 300mg  IV  DESCRIPTION OF PROCEDURE:   After the risks benefits and alternatives of the procedure were thoroughly explained, informed consent was obtained.  A digital rectal exam revealed no abnormalities of the rectum.   The LB [id number]  endoscope was introduced through the anus and advanced to the cecum, which was identified by both the appendix and ileocecal valve. No adverse events experienced.   The quality of the prep was excellent using Suprep  The instrument was then slowly withdrawn as the colon was fully examined.      COLON FINDINGS: A sessile polyp measuring 2 mm in size was found in the ascending colon.  A polypectomy was performed with cold forceps.   A sessile polyp measuring 1 mm in size was found in the proximal transverse colon.  A polypectomy was performed with cold forceps.   Mild diverticulosis was noted in the sigmoid colon. The colon was otherwise normal.  There was no diverticulosis, inflammation, polyps or cancers unless previously stated. Retroflexed views revealed no abnormalities. The time to cecum=3 minutes 12 seconds.  Withdrawal time=10 minutes 30 seconds.  The scope was  withdrawn and the procedure completed. COMPLICATIONS: There were no complications.  ENDOSCOPIC IMPRESSION: 1.   Sessile polyp measuring 2 mm in size was found in the ascending colon; polypectomy was performed with cold forceps 2.   Sessile polyp measuring 1 mm in size was found in the proximal transverse colon; polypectomy was performed with cold forceps 3.   Mild diverticulosis was noted in the sigmoid colon 4.   The colon was otherwise normal  RECOMMENDATIONS: Given your significant family history of colon cancer, you should have a repeat colonoscopy in 5 years   eSigned:  Inda Castle, MD 10/20/2013 10:16 AM   cc:   PATIENT NAME:  Frances Gray, Frances Gray MR#: [id number]

## 2013-10-20 NOTE — Progress Notes (Signed)
Called to room to assist during endoscopic procedure.  Patient ID and intended procedure confirmed with present staff. Received instructions for my participation in the procedure from the performing physician.  

## 2013-10-21 ENCOUNTER — Telehealth: Payer: Self-pay | Admitting: *Deleted

## 2013-10-21 NOTE — Telephone Encounter (Signed)
  Follow up Call-  Call back number 10/20/2013  Post procedure Call Back phone  # [phone]  Permission to leave phone message Yes     Patient questions:  Do you have a fever, pain , or abdominal swelling? no Pain Score  0 *  Have you tolerated food without any problems? yes  Have you been able to return to your normal activities? yes  Do you have any questions about your discharge instructions: Diet   no Medications  no Follow up visit  no  Do you have questions or concerns about your Care? no  Actions: * If pain score is 4 or above: No action needed, pain <4.

## 2013-10-27 ENCOUNTER — Encounter: Payer: Self-pay | Admitting: Gastroenterology

## 2013-10-30 ENCOUNTER — Other Ambulatory Visit: Payer: Self-pay | Admitting: Internal Medicine

## 2013-11-11 ENCOUNTER — Other Ambulatory Visit: Payer: Federal, State, Local not specified - PPO

## 2013-12-22 ENCOUNTER — Ambulatory Visit (INDEPENDENT_AMBULATORY_CARE_PROVIDER_SITE_OTHER)
Admission: RE | Admit: 2013-12-22 | Discharge: 2013-12-22 | Disposition: A | Discharge status: Home / Self Care | Payer: Federal, State, Local not specified - PPO | Source: Ambulatory Visit | Attending: Internal Medicine | Admitting: Internal Medicine

## 2013-12-22 DIAGNOSIS — M899 Disorder of bone, unspecified: Secondary | ICD-10-CM

## 2013-12-22 DIAGNOSIS — M949 Disorder of cartilage, unspecified: Secondary | ICD-10-CM

## 2013-12-22 DIAGNOSIS — M8580 Other specified disorders of bone density and structure, unspecified site: Secondary | ICD-10-CM

## 2013-12-29 ENCOUNTER — Encounter: Payer: Self-pay | Admitting: Internal Medicine

## 2013-12-29 DIAGNOSIS — M8580 Other specified disorders of bone density and structure, unspecified site: Secondary | ICD-10-CM | POA: Insufficient documentation

## 2014-01-04 ENCOUNTER — Other Ambulatory Visit: Payer: Self-pay | Admitting: Dermatology

## 2014-01-09 ENCOUNTER — Other Ambulatory Visit: Payer: Self-pay | Admitting: Internal Medicine

## 2014-02-25 ENCOUNTER — Encounter: Payer: Federal, State, Local not specified - PPO | Admitting: Internal Medicine

## 2014-05-12 ENCOUNTER — Other Ambulatory Visit (INDEPENDENT_AMBULATORY_CARE_PROVIDER_SITE_OTHER): Payer: Federal, State, Local not specified - PPO

## 2014-05-12 ENCOUNTER — Encounter: Payer: Self-pay | Admitting: Internal Medicine

## 2014-05-12 ENCOUNTER — Ambulatory Visit (INDEPENDENT_AMBULATORY_CARE_PROVIDER_SITE_OTHER): Payer: Federal, State, Local not specified - PPO | Admitting: Internal Medicine

## 2014-05-12 VITALS — BP 132/84 | HR 60 | Temp 97.9°F | Ht 64.0 in | Wt 265.0 lb

## 2014-05-12 DIAGNOSIS — Z0000 Encounter for general adult medical examination without abnormal findings: Secondary | ICD-10-CM

## 2014-05-12 DIAGNOSIS — E785 Hyperlipidemia, unspecified: Secondary | ICD-10-CM

## 2014-05-12 DIAGNOSIS — Z23 Encounter for immunization: Secondary | ICD-10-CM

## 2014-05-12 DIAGNOSIS — I10 Essential (primary) hypertension: Secondary | ICD-10-CM

## 2014-05-12 LAB — HEPATIC FUNCTION PANEL
ALT: 19 U/L (ref 0–35)
AST: 22 U/L (ref 0–37)
Albumin: 3.8 g/dL (ref 3.5–5.2)
Alkaline Phosphatase: 68 U/L (ref 39–117)
Bilirubin, Direct: 0.1 mg/dL (ref 0.0–0.3)
TOTAL PROTEIN: 7.8 g/dL (ref 6.0–8.3)
Total Bilirubin: 0.6 mg/dL (ref 0.2–1.2)

## 2014-05-12 LAB — URINALYSIS, ROUTINE W REFLEX MICROSCOPIC
Bilirubin Urine: NEGATIVE
HGB URINE DIPSTICK: NEGATIVE
Ketones, ur: NEGATIVE
Leukocytes, UA: NEGATIVE
Nitrite: NEGATIVE
Specific Gravity, Urine: 1.025 (ref 1.000–1.030)
TOTAL PROTEIN, URINE-UPE24: NEGATIVE
UROBILINOGEN UA: 0.2 (ref 0.0–1.0)
Urine Glucose: NEGATIVE
pH: 6.5 (ref 5.0–8.0)

## 2014-05-12 LAB — CBC WITH DIFFERENTIAL/PLATELET
BASOS ABS: 0.1 10*3/uL (ref 0.0–0.1)
Basophils Relative: 1.3 % (ref 0.0–3.0)
EOS ABS: 0.3 10*3/uL (ref 0.0–0.7)
Eosinophils Relative: 5.1 % — ABNORMAL HIGH (ref 0.0–5.0)
HEMATOCRIT: 39.9 % (ref 36.0–46.0)
HEMOGLOBIN: 13.1 g/dL (ref 12.0–15.0)
LYMPHS ABS: 1.5 10*3/uL (ref 0.7–4.0)
Lymphocytes Relative: 25.1 % (ref 12.0–46.0)
MCHC: 32.8 g/dL (ref 30.0–36.0)
MCV: 82.5 fl (ref 78.0–100.0)
MONO ABS: 0.9 10*3/uL (ref 0.1–1.0)
Monocytes Relative: 14.5 % — ABNORMAL HIGH (ref 3.0–12.0)
NEUTROS ABS: 3.2 10*3/uL (ref 1.4–7.7)
Neutrophils Relative %: 54 % (ref 43.0–77.0)
Platelets: 332 10*3/uL (ref 150.0–400.0)
RBC: 4.84 Mil/uL (ref 3.87–5.11)
RDW: 14.2 % (ref 11.5–15.5)
WBC: 5.9 10*3/uL (ref 4.0–10.5)

## 2014-05-12 LAB — LIPID PANEL
CHOL/HDL RATIO: 5
Cholesterol: 224 mg/dL — ABNORMAL HIGH (ref 0–200)
HDL: 48.8 mg/dL (ref >39.00)
LDL CALC: 155 mg/dL — AB (ref 0–99)
NONHDL: 175.2
Triglycerides: 103 mg/dL (ref 0.0–149.0)
VLDL: 20.6 mg/dL (ref 0.0–40.0)

## 2014-05-12 LAB — BASIC METABOLIC PANEL
BUN: 13 mg/dL (ref 6–23)
CHLORIDE: 106 meq/L (ref 96–112)
CO2: 25 mEq/L (ref 19–32)
Calcium: 9.2 mg/dL (ref 8.4–10.5)
Creatinine, Ser: 0.7 mg/dL (ref 0.4–1.2)
GFR: 104.73 mL/min (ref >60.00)
GLUCOSE: 95 mg/dL (ref 70–99)
POTASSIUM: 3.9 meq/L (ref 3.5–5.1)
SODIUM: 140 meq/L (ref 135–145)

## 2014-05-12 LAB — TSH: TSH: 3.51 u[IU]/mL (ref 0.35–4.50)

## 2014-05-12 MED ORDER — CALCIUM CARBONATE 600 MG PO TABS: 600.0000 mg | ORAL_TABLET | Freq: Two times a day (BID) | ORAL | Status: AC

## 2014-05-12 MED ORDER — LOSARTAN POTASSIUM-HCTZ 100-25 MG PO TABS: 1.0000 | ORAL_TABLET | Freq: Every day | ORAL | Status: DC

## 2014-05-12 NOTE — Patient Instructions (Addendum)
It was good to see you today.  We have reviewed your prior records including labs and tests today  Your annual flu shot was given and/or updated today.  Other Health Maintenance reviewed - all other recommended immunizations and age-appropriate screenings are up-to-date.  Test(s) ordered today. Your results will be released to [place] (or called to you) after review, usually within 72hours after test completion. If any changes need to be made, you will be notified at that same time.  Medications reviewed and updated, no changes recommended at this time. Refill on medication(s) as discussed today.  Work on lifestyle changes as discussed (low fat, low carb, increased protein diet; improved exercise efforts; weight loss) to control sugar, blood pressure and cholesterol levels and/or reduce risk of developing other medical problems. Look into [email or web address] or other type of food journal to assist you in this process.  Please schedule followup in 12 months for annual exam and labs, call sooner if problems.  Health Maintenance Adopting a healthy lifestyle and getting preventive care can go a long way to promote health and wellness. Talk with your health care provider about what schedule of regular examinations is right for you. This is a good chance for you to check in with your provider about disease prevention and staying healthy. In between checkups, there are plenty of things you can do on your own. Experts have done a lot of research about which lifestyle changes and preventive measures are most likely to keep you healthy. Ask your health care provider for more information. WEIGHT AND DIET  Eat a healthy diet  Be sure to include plenty of vegetables, fruits, low-fat dairy products, and lean protein.  Do not eat a lot of foods high in solid fats, added sugars, or salt.  Get regular exercise. This is one of the most important things you can do for your health.  Most adults should exercise  for at least 150 minutes each week. The exercise should increase your heart rate and make you sweat (moderate-intensity exercise).  Most adults should also do strengthening exercises at least twice a week. This is in addition to the moderate-intensity exercise.  Maintain a healthy weight  Body mass index (BMI) is a measurement that can be used to identify possible weight problems. It estimates body fat based on height and weight. Your health care provider can help determine your BMI and help you achieve or maintain a healthy weight.  For females 64 years of age and older:   A BMI below 18.5 is considered underweight.  A BMI of 18.5 to 24.9 is normal.  A BMI of 25 to 29.9 is considered overweight.  A BMI of 30 and above is considered obese.  Watch levels of cholesterol and blood lipids  You should start having your blood tested for lipids and cholesterol at 64 years of age, then have this test every 5 years.  You may need to have your cholesterol levels checked more often if:  Your lipid or cholesterol levels are high.  You are older than 64 years of age.  You are at high risk for heart disease.  CANCER SCREENING   Lung Cancer  Lung cancer screening is recommended for adults 10-38 years old who are at high risk for lung cancer because of a history of smoking.  A yearly low-dose CT scan of the lungs is recommended for people who:  Currently smoke.  Have quit within the past 15 years.  Have at least a 30-pack-year history of  smoking. A pack year is smoking an average of one pack of cigarettes a day for 1 year.  Yearly screening should continue until it has been 15 years since you quit.  Yearly screening should stop if you develop a health problem that would prevent you from having lung cancer treatment.  Breast Cancer  Practice breast self-awareness. This means understanding how your breasts normally appear and feel.  It also means doing regular breast self-exams.  Let your health care provider know about any changes, no matter how small.  If you are in your 20s or 30s, you should have a clinical breast exam (CBE) by a health care provider every 1-3 years as part of a regular health exam.  If you are 64 or older, have a CBE every year. Also consider having a breast X-ray (mammogram) every year.  If you have a family history of breast cancer, talk to your health care provider about genetic screening.  If you are at high risk for breast cancer, talk to your health care provider about having an MRI and a mammogram every year.  Breast cancer gene (BRCA) assessment is recommended for women who have family members with BRCA-related cancers. BRCA-related cancers include:  Breast.  Ovarian.  Tubal.  Peritoneal cancers.  Results of the assessment will determine the need for genetic counseling and BRCA1 and BRCA2 testing. Cervical Cancer Routine pelvic examinations to screen for cervical cancer are no longer recommended for nonpregnant women who are considered low risk for cancer of the pelvic organs (ovaries, uterus, and vagina) and who do not have symptoms. A pelvic examination may be necessary if you have symptoms including those associated with pelvic infections. Ask your health care provider if a screening pelvic exam is right for you.   The Pap test is the screening test for cervical cancer for women who are considered at risk.  If you had a hysterectomy for a problem that was not cancer or a condition that could lead to cancer, then you no longer need Pap tests.  If you are older than 64 years, and you have had normal Pap tests for the past 10 years, you no longer need to have Pap tests.  If you have had past treatment for cervical cancer or a condition that could lead to cancer, you need Pap tests and screening for cancer for at least 20 years after your treatment.  If you no longer get a Pap test, assess your risk factors if they change (such as  having a new sexual partner). This can affect whether you should start being screened again.  Some women have medical problems that increase their chance of getting cervical cancer. If this is the case for you, your health care provider may recommend more frequent screening and Pap tests.  The human papillomavirus (HPV) test is another test that may be used for cervical cancer screening. The HPV test looks for the virus that can cause cell changes in the cervix. The cells collected during the Pap test can be tested for HPV.  The HPV test can be used to screen women 42 years of age and older. Getting tested for HPV can extend the interval between normal Pap tests from three to five years.  An HPV test also should be used to screen women of any age who have unclear Pap test results.  After 64 years of age, women should have HPV testing as often as Pap tests.  Colorectal Cancer  This type of cancer can  be detected and often prevented.  Routine colorectal cancer screening usually begins at 64 years of age and continues through 64 years of age.  Your health care provider may recommend screening at an earlier age if you have risk factors for colon cancer.  Your health care provider may also recommend using home test kits to check for hidden blood in the stool.  A small camera at the end of a tube can be used to examine your colon directly (sigmoidoscopy or colonoscopy). This is done to check for the earliest forms of colorectal cancer.  Routine screening usually begins at age 69.  Direct examination of the colon should be repeated every 5-10 years through 64 years of age. However, you may need to be screened more often if early forms of precancerous polyps or small growths are found. Skin Cancer  Check your skin from head to toe regularly.  Tell your health care provider about any new moles or changes in moles, especially if there is a change in a mole's shape or color.  Also tell your  health care provider if you have a mole that is larger than the size of a pencil eraser.  Always use sunscreen. Apply sunscreen liberally and repeatedly throughout the day.  Protect yourself by wearing long sleeves, pants, a wide-brimmed hat, and sunglasses whenever you are outside. HEART DISEASE, DIABETES, AND HIGH BLOOD PRESSURE   Have your blood pressure checked at least every 1-2 years. High blood pressure causes heart disease and increases the risk of stroke.  If you are between 54 years and 69 years old, ask your health care provider if you should take aspirin to prevent strokes.  Have regular diabetes screenings. This involves taking a blood sample to check your fasting blood sugar level.  If you are at a normal weight and have a low risk for diabetes, have this test once every three years after 64 years of age.  If you are overweight and have a high risk for diabetes, consider being tested at a younger age or more often. PREVENTING INFECTION  Hepatitis B  If you have a higher risk for hepatitis B, you should be screened for this virus. You are considered at high risk for hepatitis B if:  You were born in a country where hepatitis B is common. Ask your health care provider which countries are considered high risk.  Your parents were born in a high-risk country, and you have not been immunized against hepatitis B (hepatitis B vaccine).  You have HIV or AIDS.  You use needles to inject street drugs.  You live with someone who has hepatitis B.  You have had sex with someone who has hepatitis B.  You get hemodialysis treatment.  You take certain medicines for conditions, including cancer, organ transplantation, and autoimmune conditions. Hepatitis C  Blood testing is recommended for:  Everyone born from [birth year] through 1966.  Anyone with known risk factors for hepatitis C. Sexually transmitted infections (STIs)  You should be screened for sexually transmitted infections  (STIs) including gonorrhea and chlamydia if:  You are sexually active and are younger than 64 years of age.  You are older than 64 years of age and your health care provider tells you that you are at risk for this type of infection.  Your sexual activity has changed since you were last screened and you are at an increased risk for chlamydia or gonorrhea. Ask your health care provider if you are at risk.  If you  do not have HIV, but are at risk, it may be recommended that you take a prescription medicine daily to prevent HIV infection. This is called pre-exposure prophylaxis (PrEP). You are considered at risk if:  You are sexually active and do not regularly use condoms or know the HIV status of your partner(s).  You take drugs by injection.  You are sexually active with a partner who has HIV. Talk with your health care provider about whether you are at high risk of being infected with HIV. If you choose to begin PrEP, you should first be tested for HIV. You should then be tested every 3 months for as long as you are taking PrEP.  PREGNANCY   If you are premenopausal and you may become pregnant, ask your health care provider about preconception counseling.  If you may become pregnant, take 400 to 800 micrograms (mcg) of folic acid every day.  If you want to prevent pregnancy, talk to your health care provider about birth control (contraception). OSTEOPOROSIS AND MENOPAUSE   Osteoporosis is a disease in which the bones lose minerals and strength with aging. This can result in serious bone fractures. Your risk for osteoporosis can be identified using a bone density scan.  If you are 50 years of age or older, or if you are at risk for osteoporosis and fractures, ask your health care provider if you should be screened.  Ask your health care provider whether you should take a calcium or vitamin D supplement to lower your risk for osteoporosis.  Menopause may have certain physical symptoms  and risks.  Hormone replacement therapy may reduce some of these symptoms and risks. Talk to your health care provider about whether hormone replacement therapy is right for you.  HOME CARE INSTRUCTIONS   Schedule regular health, dental, and eye exams.  Stay current with your immunizations.   Do not use any tobacco products including cigarettes, chewing tobacco, or electronic cigarettes.  If you are pregnant, do not drink alcohol.  If you are breastfeeding, limit how much and how often you drink alcohol.  Limit alcohol intake to no more than 1 drink per day for nonpregnant women. One drink equals 12 ounces of beer, 5 ounces of wine, or 1 ounces of hard liquor.  Do not use street drugs.  Do not share needles.  Ask your health care provider for help if you need support or information about quitting drugs.  Tell your health care provider if you often feel depressed.  Tell your health care provider if you have ever been abused or do not feel safe at home. Document Released: 12/10/2010 Document Revised: 10/11/2013 Document Reviewed: 04/28/2013 [facility] Patient Information 2015 [place], [place]. This information is not intended to replace advice given to you by your health care provider. Make sure you discuss any questions you have with your health care provider. Exercise to Lose Weight Exercise and a healthy diet may help you lose weight. Your doctor may suggest specific exercises. EXERCISE IDEAS AND TIPS  Choose low-cost things you enjoy doing, such as walking, bicycling, or exercising to workout videos.  Take stairs instead of the elevator.  Walk during your lunch break.  Park your car further away from work or school.  Go to a gym or an exercise class.  Start with 5 to 10 minutes of exercise each day. Build up to 30 minutes of exercise 4 to 6 days a week.  Wear shoes with good support and comfortable clothes.  Stretch before and  after working out.  Work out until you  breathe harder and your heart beats faster.  Drink extra water when you exercise.  Do not do so much that you hurt yourself, feel dizzy, or get very short of breath. Exercises that burn about 150 calories:  Running 1  [place] in 15 minutes.  Playing volleyball for 45 to 60 minutes.  Washing and waxing a car for 45 to 60 minutes.  Playing touch football for 45 minutes.  Walking 1  [place] in 35 minutes.  Pushing a stroller 1  [place] in 30 minutes.  Playing basketball for 30 minutes.  Raking leaves for 30 minutes.  Bicycling 5 [place] in 30 minutes.  Walking 2 [place] in 30 minutes.  Dancing for 30 minutes.  Shoveling snow for 15 minutes.  Swimming laps for 20 minutes.  Walking up stairs for 15 minutes.  Bicycling 4 [place] in 15 minutes.  Gardening for 30 to 45 minutes.  Jumping rope for 15 minutes.  Washing windows or floors for 45 to 60 minutes. Document Released: 06/29/2010 Document Revised: 08/19/2011 Document Reviewed: 06/29/2010 [facility] Patient Information 2015 [place], [place]. This information is not intended to replace advice given to you by your health care provider. Make sure you discuss any questions you have with your health care provider.

## 2014-05-12 NOTE — Progress Notes (Signed)
Subjective:    Patient ID: Frances Gray, female    DOB: Jan 17, 1951, 63 y.o.   MRN: [id number]  HPI  patient is here today for annual physical. Patient feels well and has no complaints.  Also reviewed chronic medical issues and interval medical events  Past Medical History  Diagnosis Date  . Arthritis     L knee s/p TKR, R DJD knee  . GERD (gastroesophageal reflux disease)   . Allergic rhinitis, seasonal   . History of chicken pox   . Diverticulitis   . Heart murmur     no symptoms  . Anemia     has had anemia in the past. none now  . 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 Vitamin D deficiency disease 08/14/2011  . Goiter, nodular     01/01/12 bx: Hashimoto's thyroiditis, no malignancy  . Hypertension   . Osteoarthritis of left knee 07/03/2011  . Severe obesity (BMI >= 40)   . Osteopenia 12/29/2013    DEXA @ [facility] 12/2013: -1.2   Family History  Problem Relation Age of Onset  . Ovarian cancer Mother   . Colon cancer Sister 35  . Breast cancer Sister   . Esophageal cancer Neg Hx    History  Substance Use Topics  . Smoking status: Former Smoker -- 0.25 packs/day for 10 years    Quit date: 06/10/1980  . Smokeless tobacco: Never Used  . Alcohol Use: No    Review of Systems  Constitutional: Negative for fatigue and unexpected weight change.  Respiratory: Negative for cough, shortness of breath and wheezing.   Cardiovascular: Negative for chest pain, palpitations and leg swelling.  Gastrointestinal: Negative for nausea, abdominal pain and diarrhea.  Neurological: Negative for dizziness, weakness, light-headedness and headaches.  Psychiatric/Behavioral: Negative for dysphoric mood. The patient is not nervous/anxious.   All other systems reviewed and are negative.      Objective:   Physical Exam  BP 132/84 mmHg  Pulse 60  Temp(Src) 97.9 F (36.6 C) (Oral)  Ht 5\' 4"  (1.626 m)  Wt 265 lb (120.203 kg)  BMI 45.46 kg/m2  SpO2  96% Wt Readings from Last 3 Encounters:  05/12/14 265 lb (120.203 kg)  10/06/13 263 lb 9.6 oz (119.568 kg)  08/20/13 255 lb 12.8 oz (116.03 kg)   Constitutional: She is obese, appears well-developed and well-nourished. No distress.  HENT: Head: Normocephalic and atraumatic. Ears: B TMs ok, no erythema or effusion; Nose: Nose normal. Mouth/Throat: Oropharynx is clear and moist. No oropharyngeal exudate.  Eyes: Conjunctivae and EOM are normal. Pupils are equal, round, and reactive to light. No scleral icterus.  Neck: Normal range of motion. Neck supple. No JVD present. No thyromegaly present.  Cardiovascular: Normal rate, regular rhythm and normal heart sounds.  No murmur heard. No BLE edema. Pulmonary/Chest: Effort normal and breath sounds normal. No respiratory distress. She has no wheezes.  Abdominal: Soft. Bowel sounds are normal. She exhibits no distension. There is no tenderness. no masses GU/breast: defer to gyn Musculoskeletal: R knee - boggy synovitis - tender to palpation over joint line; FROM and ligamentous function intact Otherwise Normal range of motion, no joint effusions. No gross deformities Neurological: She is alert and oriented to person, place, and time. No cranial nerve deficit. Coordination, balance, strength, speech and gait are normal.  Skin: Skin is warm and dry. No rash noted. No erythema.  Psychiatric: She has a normal mood and affect. Her behavior is  normal. Judgment and thought content normal.    Lab Results  Component Value Date   WBC 5.1 02/11/2013   HGB 11.3* 02/11/2013   HCT 34.3* 02/11/2013   PLT 294.0 02/11/2013   GLUCOSE 84 08/20/2013   CHOL 186 02/11/2013   TRIG 107.0 02/11/2013   HDL 53.80 02/11/2013   LDLCALC 111* 02/11/2013   ALT 17 02/12/2012   AST 18 02/12/2012   NA 137 08/20/2013   K 3.9 08/20/2013   CL 101 08/20/2013   CREATININE 0.6 08/20/2013   BUN 16 08/20/2013   CO2 30 08/20/2013   TSH 3.76 08/20/2013   INR 1.04 11/10/2012     Dg Bone Density  12/27/2013   Date of study: 12/22/2013 Exam: DUAL X-RAY ABSORPTIOMETRY (DXA) FOR BONE MINERAL DENSITY (BMD) Instrument: [employer] Requesting Provider: Dr. Asa Lente Indication: screening for osteoporosis Comparison: none (please note that it is not possible to compare data from  different instruments) Clinical data: Pt is a postmenopausal 63 y.o. female without previous h/o  fracture. On calcium and vitamin D.  Results:  Lumbar spine (L1-L4) Femoral neck (FN)  T-score 0.2 RFN: -1.2 LFN: -1.0  BMD (g/cm2) 1.198 RFN: 0.865 LFN: 0.904   Assessment: the BMD is low at the level of the R hip according to the [facility]  classification for osteoporosis (see below). Fracture risk: moderate FRAX score: not calculated Comments: the technical quality of the study is good. Evaluation for secondary causes should be considered if clinically  indicated.  Recommend optimizing calcium (1200 mg/day) and vitamin D (800 IU/day)  intake. No pharmacological treatment is indicated. Followup: Repeat BMD is appropriate after 2 years.  WHO criteria for diagnosis of osteoporosis in postmenopausal women and in  men 44 y/o or older:  - normal: T-score -1.0 to + 1.0 - osteopenia/low bone density: T-score between -2.5 and -1.0 - osteoporosis: T-score below -2.5 - severe osteoporosis: T-score below -2.5 with history of fragility  fracture Note: although not part of the WHO classification, the presence of a  fragility fracture, regardless of the T-score, should be considered  diagnostic of osteoporosis, provided other causes for the fracture have  been excluded.  Treatment: The National Osteoporosis Foundation recommends that treatment  be considered in postmenopausal women and men age 53 or older with: 1. Hip or vertebral (clinical or morphometric) fracture 2. T-score of - 2.5 or lower at the spine or hip 3. 10-year fracture probability by FRAX of at least 20% for a major  osteoporotic fracture and 3% for a hip fracture   Philemon Kingdom, MD [facility]          Assessment & Plan:   CPX/z00.00 - Patient has been counseled on age-appropriate routine health concerns for screening and prevention. These are reviewed and up-to-date. Immunizations are up-to-date or declined. Labs ordered and reviewed.  Problem List Items Addressed This Visit    Hypertension    BP Readings from Last 3 Encounters:  05/12/14 132/84  10/20/13 124/72  08/20/13 132/80   Changed lotrel to max dose ARB+hctz 06/2011 due to $ Cough hx with ACEI reviewed The current medical regimen is generally effective; continue present plan and medications. The patient is asked to make an attempt to improve diet and exercise patterns to aid in medical management of this problem.     Relevant Medications      losartan-hydrochlorothiazide (HYZAAR) 100-25 MG per tablet   Severe obesity (BMI >= 40)    Wt Readings from Last 3 Encounters:  05/12/14  265 lb (120.203 kg)  10/06/13 263 lb 9.6 oz (119.568 kg)  08/20/13 255 lb 12.8 oz (116.03 kg)   The patient is asked to make an attempt to improve diet and exercise patterns to aid in medical management of this problem.     Other Visit Diagnoses    Routine general medical examination at a health care facility    -  Primary    Relevant Orders       Basic metabolic panel       CBC with Differential       Hepatic function panel       Lipid panel       TSH       Urinalysis, Routine w reflex microscopic

## 2014-05-12 NOTE — Assessment & Plan Note (Signed)
Wt Readings from Last 3 Encounters:  05/12/14 265 lb (120.203 kg)  10/06/13 263 lb 9.6 oz (119.568 kg)  08/20/13 255 lb 12.8 oz (116.03 kg)   The patient is asked to make an attempt to improve diet and exercise patterns to aid in medical management of this problem.

## 2014-05-12 NOTE — Progress Notes (Signed)
Pre visit review using our clinic review tool, if applicable. No additional management support is needed unless otherwise documented below in the visit note. 

## 2014-05-12 NOTE — Assessment & Plan Note (Signed)
BP Readings from Last 3 Encounters:  05/12/14 132/84  10/20/13 124/72  08/20/13 132/80   Changed lotrel to max dose ARB+hctz 06/2011 due to $ Cough hx with ACEI reviewed The current medical regimen is generally effective; continue present plan and medications. The patient is asked to make an attempt to improve diet and exercise patterns to aid in medical management of this problem.

## 2014-05-14 ENCOUNTER — Encounter: Payer: Self-pay | Admitting: Internal Medicine

## 2014-05-14 DIAGNOSIS — E785 Hyperlipidemia, unspecified: Secondary | ICD-10-CM | POA: Insufficient documentation

## 2014-05-14 MED ORDER — PRAVASTATIN SODIUM 20 MG PO TABS: 20.0000 mg | ORAL_TABLET | Freq: Every day | ORAL | Status: DC

## 2014-05-14 NOTE — Addendum Note (Signed)
Addended by: Gwendolyn Grant A on: 05/14/2014 11:06 AM   Modules accepted: Orders, SmartSet

## 2014-10-04 ENCOUNTER — Other Ambulatory Visit: Payer: Self-pay | Admitting: Surgery

## 2014-10-04 DIAGNOSIS — E042 Nontoxic multinodular goiter: Secondary | ICD-10-CM

## 2014-10-10 ENCOUNTER — Other Ambulatory Visit: Payer: Self-pay | Admitting: Surgery

## 2014-10-10 DIAGNOSIS — E042 Nontoxic multinodular goiter: Secondary | ICD-10-CM

## 2014-10-13 ENCOUNTER — Ambulatory Visit
Admission: RE | Admit: 2014-10-13 | Discharge: 2014-10-13 | Disposition: A | Discharge status: Home / Self Care | Payer: Federal, State, Local not specified - PPO | Source: Ambulatory Visit | Attending: Surgery | Admitting: Surgery

## 2014-10-27 ENCOUNTER — Other Ambulatory Visit: Payer: Self-pay | Admitting: Orthopedic Surgery

## 2014-11-23 NOTE — Pre-Procedure Instructions (Signed)
    Frances Gray  11/23/2014      [id number] - [place], [address] [place] [address] Phone: [phone] Fax: [phone]    Your procedure is scheduled on Tues, June 28 @ 7:30 AM  Report to [facility] Admitting at 5:30 AM  Call this number if you have problems the morning of surgery:  [phone]   Remember:  Do not eat food or drink liquids after midnight.               Stop taking any Vitamins or Herbal Medications along with any BC's,Goody's,Aleve,Aspirin,Ibuprofen,or FIsh Oil.   Do not wear jewelry, make-up or nail polish.  Do not wear lotions, powders, or perfumes.  You may wear deodorant.  Do not shave 48 hours prior to surgery.    Do not bring valuables to the hospital.  [facility] is not responsible for any belongings or valuables.  Contacts, dentures or bridgework may not be worn into surgery.  Leave your suitcase in the car.  After surgery it may be brought to your room.  For patients admitted to the hospital, discharge time will be determined by your treatment team.  Patients discharged the day of surgery will not be allowed to drive home.    Special instructions:    Please read over the following fact sheets that you were given. Pain Booklet, Coughing and Deep Breathing, MRSA Information and Surgical Site Infection Prevention

## 2014-11-24 ENCOUNTER — Encounter (HOSPITAL_COMMUNITY): Payer: Self-pay

## 2014-11-24 ENCOUNTER — Encounter (HOSPITAL_COMMUNITY)
Admission: RE | Admit: 2014-11-24 | Discharge: 2014-11-24 | Disposition: A | Discharge status: Home / Self Care | Payer: Medicare Other | Source: Ambulatory Visit | Attending: Orthopedic Surgery | Admitting: Orthopedic Surgery

## 2014-11-24 DIAGNOSIS — M179 Osteoarthritis of knee, unspecified: Secondary | ICD-10-CM | POA: Diagnosis not present

## 2014-11-24 DIAGNOSIS — Z01812 Encounter for preprocedural laboratory examination: Secondary | ICD-10-CM | POA: Diagnosis not present

## 2014-11-24 HISTORY — DX: Personal history of other diseases of the respiratory system: Z87.09

## 2014-11-24 LAB — CBC
HEMATOCRIT: 39.4 % (ref 36.0–46.0)
Hemoglobin: 12.8 g/dL (ref 12.0–15.0)
MCH: 26.8 pg (ref 26.0–34.0)
MCHC: 32.5 g/dL (ref 30.0–36.0)
MCV: 82.4 fL (ref 78.0–100.0)
Platelets: 346 10*3/uL (ref 150–400)
RBC: 4.78 MIL/uL (ref 3.87–5.11)
RDW: 13.9 % (ref 11.5–15.5)
WBC: 5.1 10*3/uL (ref 4.0–10.5)

## 2014-11-24 LAB — BASIC METABOLIC PANEL
Anion gap: 6 (ref 5–15)
BUN: 14 mg/dL (ref 6–20)
CO2: 28 mmol/L (ref 22–32)
CREATININE: 0.7 mg/dL (ref 0.44–1.00)
Calcium: 9.5 mg/dL (ref 8.9–10.3)
Chloride: 105 mmol/L (ref 101–111)
GFR calc Af Amer: >60 mL/min (ref >60)
GLUCOSE: 113 mg/dL — AB (ref 65–99)
Potassium: 3.8 mmol/L (ref 3.5–5.1)
Sodium: 139 mmol/L (ref 135–145)

## 2014-11-24 LAB — SURGICAL PCR SCREEN
MRSA, PCR: NEGATIVE
STAPHYLOCOCCUS AUREUS: NEGATIVE

## 2014-11-24 NOTE — Progress Notes (Addendum)
Pt doesn't have a Cardiologist  Medical Md is Dr.Valerie Leschber  EKG to request from medical md  CXR denies in past yr  Echo denies ever having  Stress test denies ever having  Heart cath denies ever having

## 2014-11-24 NOTE — Progress Notes (Signed)
   11/24/14 0933  OBSTRUCTIVE SLEEP APNEA  Have you ever been diagnosed with sleep apnea through a sleep study? No  Do you snore loudly (loud enough to be heard through closed doors)?  0  Do you often feel tired, fatigued, or sleepy during the daytime? 0  Has anyone observed you stop breathing during your sleep? 0  Do you have, or are you being treated for high blood pressure? 1  BMI more than 35 kg/m2? 1  Age over 65 years old? 1  Neck circumference greater than 40 cm/16 inches? 1 (16)  Gender: 0

## 2014-12-05 MED ORDER — DEXTROSE 5 % IV SOLN
3.0000 g | INTRAVENOUS | Status: AC
  Administered 2014-12-06: 3 g via INTRAVENOUS
  Filled 2014-12-05: qty 3000

## 2014-12-06 ENCOUNTER — Inpatient Hospital Stay (HOSPITAL_COMMUNITY): Payer: Medicare Other | Admitting: Anesthesiology

## 2014-12-06 ENCOUNTER — Encounter (HOSPITAL_COMMUNITY): Payer: Self-pay | Admitting: Surgery

## 2014-12-06 ENCOUNTER — Inpatient Hospital Stay (HOSPITAL_COMMUNITY): Payer: Medicare Other

## 2014-12-06 ENCOUNTER — Inpatient Hospital Stay (HOSPITAL_COMMUNITY)
Admission: RE | Admit: 2014-12-06 | Discharge: 2014-12-08 | DRG: 470 | Disposition: A | Discharge status: Skilled Nursing Facility | Payer: Medicare Other | Source: Ambulatory Visit | Attending: Orthopedic Surgery | Admitting: Orthopedic Surgery

## 2014-12-06 ENCOUNTER — Encounter (HOSPITAL_COMMUNITY)
Admission: RE | Disposition: A | Discharge status: Skilled Nursing Facility | Payer: Self-pay | Source: Ambulatory Visit | Attending: Orthopedic Surgery

## 2014-12-06 DIAGNOSIS — M179 Osteoarthritis of knee, unspecified: Secondary | ICD-10-CM | POA: Diagnosis not present

## 2014-12-06 DIAGNOSIS — Z6841 Body Mass Index (BMI) 40.0 and over, adult: Secondary | ICD-10-CM

## 2014-12-06 DIAGNOSIS — Z79899 Other long term (current) drug therapy: Secondary | ICD-10-CM

## 2014-12-06 DIAGNOSIS — Z888 Allergy status to other drugs, medicaments and biological substances status: Secondary | ICD-10-CM | POA: Diagnosis not present

## 2014-12-06 DIAGNOSIS — Z96652 Presence of left artificial knee joint: Secondary | ICD-10-CM | POA: Diagnosis present

## 2014-12-06 DIAGNOSIS — E785 Hyperlipidemia, unspecified: Secondary | ICD-10-CM | POA: Diagnosis present

## 2014-12-06 DIAGNOSIS — M1710 Unilateral primary osteoarthritis, unspecified knee: Secondary | ICD-10-CM | POA: Diagnosis present

## 2014-12-06 DIAGNOSIS — K219 Gastro-esophageal reflux disease without esophagitis: Secondary | ICD-10-CM | POA: Diagnosis present

## 2014-12-06 DIAGNOSIS — M6281 Muscle weakness (generalized): Secondary | ICD-10-CM | POA: Diagnosis not present

## 2014-12-06 DIAGNOSIS — I10 Essential (primary) hypertension: Secondary | ICD-10-CM | POA: Diagnosis not present

## 2014-12-06 DIAGNOSIS — Z87891 Personal history of nicotine dependence: Secondary | ICD-10-CM | POA: Diagnosis not present

## 2014-12-06 DIAGNOSIS — Z96659 Presence of unspecified artificial knee joint: Secondary | ICD-10-CM

## 2014-12-06 DIAGNOSIS — R278 Other lack of coordination: Secondary | ICD-10-CM | POA: Diagnosis not present

## 2014-12-06 DIAGNOSIS — Z471 Aftercare following joint replacement surgery: Secondary | ICD-10-CM | POA: Diagnosis not present

## 2014-12-06 DIAGNOSIS — M1990 Unspecified osteoarthritis, unspecified site: Secondary | ICD-10-CM | POA: Diagnosis not present

## 2014-12-06 DIAGNOSIS — M1711 Unilateral primary osteoarthritis, right knee: Secondary | ICD-10-CM | POA: Diagnosis not present

## 2014-12-06 DIAGNOSIS — Z96651 Presence of right artificial knee joint: Secondary | ICD-10-CM | POA: Diagnosis not present

## 2014-12-06 DIAGNOSIS — R2681 Unsteadiness on feet: Secondary | ICD-10-CM | POA: Diagnosis not present

## 2014-12-06 DIAGNOSIS — M25561 Pain in right knee: Secondary | ICD-10-CM | POA: Diagnosis not present

## 2014-12-06 DIAGNOSIS — D649 Anemia, unspecified: Secondary | ICD-10-CM | POA: Diagnosis not present

## 2014-12-06 HISTORY — PX: TOTAL KNEE ARTHROPLASTY: SHX125

## 2014-12-06 SURGERY — ARTHROPLASTY, KNEE, TOTAL
Anesthesia: Monitor Anesthesia Care | Site: Knee | Laterality: Right

## 2014-12-06 MED ORDER — ONDANSETRON HCL 4 MG/2ML IJ SOLN
INTRAMUSCULAR | Status: DC | PRN
  Administered 2014-12-06: 4 mg via INTRAVENOUS

## 2014-12-06 MED ORDER — DIPHENHYDRAMINE HCL 12.5 MG/5ML PO ELIX: 12.5000 mg | ORAL_SOLUTION | ORAL | Status: DC | PRN

## 2014-12-06 MED ORDER — LOSARTAN POTASSIUM 50 MG PO TABS
100.0000 mg | ORAL_TABLET | Freq: Every day | ORAL | Status: DC
  Administered 2014-12-07 – 2014-12-08 (×2): 100 mg via ORAL
  Filled 2014-12-06 (×2): qty 2

## 2014-12-06 MED ORDER — LOSARTAN POTASSIUM-HCTZ 100-25 MG PO TABS: 1.0000 | ORAL_TABLET | Freq: Every day | ORAL | Status: DC

## 2014-12-06 MED ORDER — EPHEDRINE SULFATE 50 MG/ML IJ SOLN
INTRAMUSCULAR | Status: AC
  Filled 2014-12-06: qty 1

## 2014-12-06 MED ORDER — ALUM & MAG HYDROXIDE-SIMETH 200-200-20 MG/5ML PO SUSP: 30.0000 mL | ORAL | Status: DC | PRN

## 2014-12-06 MED ORDER — ARTIFICIAL TEARS OP OINT
TOPICAL_OINTMENT | OPHTHALMIC | Status: AC
  Filled 2014-12-06: qty 3.5

## 2014-12-06 MED ORDER — ONDANSETRON HCL 4 MG PO TABS: 4.0000 mg | ORAL_TABLET | Freq: Four times a day (QID) | ORAL | Status: DC | PRN

## 2014-12-06 MED ORDER — ROCURONIUM BROMIDE 50 MG/5ML IV SOLN
INTRAVENOUS | Status: AC
  Filled 2014-12-06: qty 1

## 2014-12-06 MED ORDER — FENTANYL CITRATE (PF) 100 MCG/2ML IJ SOLN
INTRAMUSCULAR | Status: DC | PRN
  Administered 2014-12-06 (×3): 25 ug via INTRAVENOUS

## 2014-12-06 MED ORDER — GLYCOPYRROLATE 0.2 MG/ML IJ SOLN
INTRAMUSCULAR | Status: AC
  Filled 2014-12-06: qty 3

## 2014-12-06 MED ORDER — PROPOFOL INFUSION 10 MG/ML OPTIME
INTRAVENOUS | Status: DC | PRN
  Administered 2014-12-06: 50 ug/kg/min via INTRAVENOUS

## 2014-12-06 MED ORDER — SODIUM CHLORIDE 0.9 % IJ SOLN
INTRAMUSCULAR | Status: AC
  Filled 2014-12-06: qty 10

## 2014-12-06 MED ORDER — METHOCARBAMOL 500 MG PO TABS
500.0000 mg | ORAL_TABLET | Freq: Four times a day (QID) | ORAL | Status: DC | PRN
  Administered 2014-12-06 – 2014-12-08 (×5): 500 mg via ORAL
  Filled 2014-12-06 (×4): qty 1

## 2014-12-06 MED ORDER — RIVAROXABAN 10 MG PO TABS: 10.0000 mg | ORAL_TABLET | Freq: Every day | ORAL | Status: DC

## 2014-12-06 MED ORDER — LACTATED RINGERS IV SOLN
INTRAVENOUS | Status: DC | PRN
  Administered 2014-12-06 (×2): via INTRAVENOUS

## 2014-12-06 MED ORDER — NEOSTIGMINE METHYLSULFATE 10 MG/10ML IV SOLN
INTRAVENOUS | Status: AC
  Filled 2014-12-06: qty 1

## 2014-12-06 MED ORDER — CALCIUM CARBONATE 600 MG PO TABS
600.0000 mg | ORAL_TABLET | Freq: Two times a day (BID) | ORAL | Status: DC
  Filled 2014-12-06 (×2): qty 1

## 2014-12-06 MED ORDER — KETOROLAC TROMETHAMINE 15 MG/ML IJ SOLN
INTRAMUSCULAR | Status: AC
  Filled 2014-12-06: qty 1

## 2014-12-06 MED ORDER — SODIUM CHLORIDE 0.9 % IR SOLN
Status: DC | PRN
  Administered 2014-12-06: 3000 mL
  Administered 2014-12-06: 1000 mL

## 2014-12-06 MED ORDER — OXYCODONE HCL 5 MG PO TABS
ORAL_TABLET | ORAL | Status: AC
  Filled 2014-12-06: qty 2

## 2014-12-06 MED ORDER — DEXTROSE 5 % IV SOLN
500.0000 mg | Freq: Four times a day (QID) | INTRAVENOUS | Status: DC | PRN
  Filled 2014-12-06: qty 5

## 2014-12-06 MED ORDER — METHOCARBAMOL 500 MG PO TABS
ORAL_TABLET | ORAL | Status: AC
  Filled 2014-12-06: qty 1

## 2014-12-06 MED ORDER — SUCCINYLCHOLINE CHLORIDE 20 MG/ML IJ SOLN
INTRAMUSCULAR | Status: AC
  Filled 2014-12-06: qty 1

## 2014-12-06 MED ORDER — CEFAZOLIN SODIUM 10 G IJ SOLR
3.0000 g | Freq: Four times a day (QID) | INTRAMUSCULAR | Status: AC
  Administered 2014-12-06 (×2): 3 g via INTRAVENOUS
  Filled 2014-12-06 (×2): qty 3000

## 2014-12-06 MED ORDER — SENNA 8.6 MG PO TABS
1.0000 | ORAL_TABLET | Freq: Two times a day (BID) | ORAL | Status: DC
  Administered 2014-12-06 – 2014-12-08 (×5): 8.6 mg via ORAL
  Filled 2014-12-06 (×4): qty 1

## 2014-12-06 MED ORDER — MAGNESIUM CITRATE PO SOLN: 1.0000 | Freq: Once | ORAL | Status: AC | PRN

## 2014-12-06 MED ORDER — VITAMIN D3 25 MCG (1000 UNIT) PO TABS
2000.0000 [IU] | ORAL_TABLET | Freq: Every morning | ORAL | Status: DC
  Administered 2014-12-06 – 2014-12-08 (×3): 2000 [IU] via ORAL
  Filled 2014-12-06 (×5): qty 2

## 2014-12-06 MED ORDER — PROPOFOL 10 MG/ML IV BOLUS
INTRAVENOUS | Status: AC
  Filled 2014-12-06: qty 20

## 2014-12-06 MED ORDER — HYDROMORPHONE HCL 1 MG/ML IJ SOLN
INTRAMUSCULAR | Status: AC
  Filled 2014-12-06: qty 1

## 2014-12-06 MED ORDER — FLUOCINOLONE ACETONIDE SCALP 0.01 % EX OIL: 1.0000 mL | TOPICAL_OIL | Freq: Every day | CUTANEOUS | Status: DC | PRN

## 2014-12-06 MED ORDER — FLUTICASONE PROPIONATE 50 MCG/ACT NA SUSP: 1.0000 | Freq: Every day | NASAL | Status: DC | PRN

## 2014-12-06 MED ORDER — ONDANSETRON HCL 4 MG PO TABS: 4.0000 mg | ORAL_TABLET | Freq: Three times a day (TID) | ORAL | Status: DC | PRN

## 2014-12-06 MED ORDER — MENTHOL 3 MG MT LOZG: 1.0000 | LOZENGE | OROMUCOSAL | Status: DC | PRN

## 2014-12-06 MED ORDER — DEXAMETHASONE SODIUM PHOSPHATE 4 MG/ML IJ SOLN
INTRAMUSCULAR | Status: DC | PRN
  Administered 2014-12-06: 4 mg via INTRAVENOUS

## 2014-12-06 MED ORDER — PHENOL 1.4 % MT LIQD: 1.0000 | OROMUCOSAL | Status: DC | PRN

## 2014-12-06 MED ORDER — MIDAZOLAM HCL 2 MG/2ML IJ SOLN
INTRAMUSCULAR | Status: AC
  Filled 2014-12-06: qty 2

## 2014-12-06 MED ORDER — DOCUSATE SODIUM 100 MG PO CAPS
100.0000 mg | ORAL_CAPSULE | Freq: Two times a day (BID) | ORAL | Status: DC
Start: 2014-12-06 — End: 2014-12-08
  Administered 2014-12-06 – 2014-12-08 (×5): 100 mg via ORAL
  Filled 2014-12-06 (×4): qty 1

## 2014-12-06 MED ORDER — BACLOFEN 10 MG PO TABS: 10.0000 mg | ORAL_TABLET | Freq: Three times a day (TID) | ORAL | Status: DC

## 2014-12-06 MED ORDER — ACETAMINOPHEN 325 MG PO TABS
650.0000 mg | ORAL_TABLET | Freq: Four times a day (QID) | ORAL | Status: DC | PRN
  Administered 2014-12-08: 650 mg via ORAL
  Filled 2014-12-06: qty 2

## 2014-12-06 MED ORDER — BISACODYL 10 MG RE SUPP: 10.0000 mg | Freq: Every day | RECTAL | Status: DC | PRN

## 2014-12-06 MED ORDER — OXYCODONE HCL 5 MG PO TABS
5.0000 mg | ORAL_TABLET | ORAL | Status: DC | PRN
  Administered 2014-12-06 – 2014-12-08 (×11): 10 mg via ORAL
  Filled 2014-12-06 (×10): qty 2

## 2014-12-06 MED ORDER — ONDANSETRON HCL 4 MG/2ML IJ SOLN
INTRAMUSCULAR | Status: AC
  Filled 2014-12-06: qty 2

## 2014-12-06 MED ORDER — ACETAMINOPHEN 650 MG RE SUPP: 650.0000 mg | Freq: Four times a day (QID) | RECTAL | Status: DC | PRN

## 2014-12-06 MED ORDER — LORATADINE 10 MG PO TABS: 10.0000 mg | ORAL_TABLET | Freq: Every day | ORAL | Status: DC | PRN

## 2014-12-06 MED ORDER — HYDROCHLOROTHIAZIDE 25 MG PO TABS
25.0000 mg | ORAL_TABLET | Freq: Every day | ORAL | Status: DC
Start: 2014-12-06 — End: 2014-12-08
  Administered 2014-12-07 – 2014-12-08 (×2): 25 mg via ORAL
  Filled 2014-12-06 (×2): qty 1

## 2014-12-06 MED ORDER — OXYCODONE-ACETAMINOPHEN 10-325 MG PO TABS: 1.0000 | ORAL_TABLET | Freq: Four times a day (QID) | ORAL | Status: DC | PRN

## 2014-12-06 MED ORDER — PHENYLEPHRINE HCL 10 MG/ML IJ SOLN
INTRAMUSCULAR | Status: DC | PRN
  Administered 2014-12-06: 80 ug via INTRAVENOUS
  Administered 2014-12-06: 40 ug via INTRAVENOUS

## 2014-12-06 MED ORDER — POLYETHYLENE GLYCOL 3350 17 G PO PACK: 17.0000 g | PACK | Freq: Every day | ORAL | Status: DC | PRN

## 2014-12-06 MED ORDER — EPHEDRINE SULFATE 50 MG/ML IJ SOLN
INTRAMUSCULAR | Status: DC | PRN
Start: 2014-12-06 — End: 2014-12-06
  Administered 2014-12-06 (×5): 5 mg via INTRAVENOUS

## 2014-12-06 MED ORDER — ONDANSETRON HCL 4 MG/2ML IJ SOLN: 4.0000 mg | Freq: Four times a day (QID) | INTRAMUSCULAR | Status: DC | PRN

## 2014-12-06 MED ORDER — LIDOCAINE HCL (CARDIAC) 20 MG/ML IV SOLN
INTRAVENOUS | Status: AC
  Filled 2014-12-06: qty 5

## 2014-12-06 MED ORDER — KETOROLAC TROMETHAMINE 15 MG/ML IJ SOLN
7.5000 mg | Freq: Four times a day (QID) | INTRAMUSCULAR | Status: AC
  Administered 2014-12-06 – 2014-12-07 (×3): 7.5 mg via INTRAVENOUS
  Filled 2014-12-06 (×3): qty 1

## 2014-12-06 MED ORDER — PHENYLEPHRINE 40 MCG/ML (10ML) SYRINGE FOR IV PUSH (FOR BLOOD PRESSURE SUPPORT)
PREFILLED_SYRINGE | INTRAVENOUS | Status: AC
  Filled 2014-12-06: qty 10

## 2014-12-06 MED ORDER — MIDAZOLAM HCL 5 MG/5ML IJ SOLN
INTRAMUSCULAR | Status: DC | PRN
  Administered 2014-12-06 (×2): 1 mg via INTRAVENOUS

## 2014-12-06 MED ORDER — METOCLOPRAMIDE HCL 5 MG PO TABS: 5.0000 mg | ORAL_TABLET | Freq: Three times a day (TID) | ORAL | Status: DC | PRN

## 2014-12-06 MED ORDER — BUPIVACAINE HCL (PF) 0.25 % IJ SOLN
INTRAMUSCULAR | Status: DC | PRN
  Administered 2014-12-06: 1 mL

## 2014-12-06 MED ORDER — RIVAROXABAN 10 MG PO TABS
10.0000 mg | ORAL_TABLET | Freq: Every day | ORAL | Status: DC
  Administered 2014-12-07 – 2014-12-08 (×2): 10 mg via ORAL
  Filled 2014-12-06 (×2): qty 1

## 2014-12-06 MED ORDER — POTASSIUM CHLORIDE IN NACL 20-0.45 MEQ/L-% IV SOLN
INTRAVENOUS | Status: DC
  Administered 2014-12-06: 13:00:00 via INTRAVENOUS
  Filled 2014-12-06 (×2): qty 1000

## 2014-12-06 MED ORDER — CALCIUM CARBONATE 1250 (500 CA) MG PO TABS
1.0000 | ORAL_TABLET | Freq: Two times a day (BID) | ORAL | Status: DC
  Administered 2014-12-07 – 2014-12-08 (×3): 500 mg via ORAL
  Filled 2014-12-06 (×4): qty 1

## 2014-12-06 MED ORDER — BIOTIN 5 MG PO CAPS: 5.0000 mg | ORAL_CAPSULE | Freq: Every morning | ORAL | Status: DC

## 2014-12-06 MED ORDER — SENNA-DOCUSATE SODIUM 8.6-50 MG PO TABS: 2.0000 | ORAL_TABLET | Freq: Every day | ORAL | Status: DC

## 2014-12-06 MED ORDER — BUPIVACAINE LIPOSOME 1.3 % IJ SUSP
20.0000 mL | INTRAMUSCULAR | Status: DC
  Filled 2014-12-06: qty 20

## 2014-12-06 MED ORDER — BUPIVACAINE LIPOSOME 1.3 % IJ SUSP
INTRAMUSCULAR | Status: DC | PRN
  Administered 2014-12-06: 20 mL

## 2014-12-06 MED ORDER — FENTANYL CITRATE (PF) 250 MCG/5ML IJ SOLN
INTRAMUSCULAR | Status: AC
  Filled 2014-12-06: qty 5

## 2014-12-06 MED ORDER — BUPIVACAINE IN DEXTROSE 0.75-8.25 % IT SOLN
INTRATHECAL | Status: DC | PRN
  Administered 2014-12-06: 15 mg via INTRATHECAL

## 2014-12-06 MED ORDER — HYDROMORPHONE HCL 1 MG/ML IJ SOLN
0.2500 mg | INTRAMUSCULAR | Status: DC | PRN
  Administered 2014-12-06 (×4): 0.5 mg via INTRAVENOUS

## 2014-12-06 MED ORDER — HYDROMORPHONE HCL 1 MG/ML IJ SOLN
1.0000 mg | INTRAMUSCULAR | Status: DC | PRN
  Administered 2014-12-06: 1 mg via INTRAVENOUS
  Filled 2014-12-06 (×2): qty 1

## 2014-12-06 MED ORDER — METOCLOPRAMIDE HCL 5 MG/ML IJ SOLN: 5.0000 mg | Freq: Three times a day (TID) | INTRAMUSCULAR | Status: DC | PRN

## 2014-12-06 SURGICAL SUPPLY — 62 items
BANDAGE ELASTIC 6 VELCRO ST LF (GAUZE/BANDAGES/DRESSINGS) ×3 IMPLANT
BANDAGE ESMARK 6X9 LF (GAUZE/BANDAGES/DRESSINGS) ×1 IMPLANT
BENZOIN TINCTURE PRP APPL 2/3 (GAUZE/BANDAGES/DRESSINGS) ×3 IMPLANT
BLADE SAG 18X100X1.27 (BLADE) ×3 IMPLANT
BLADE SAW RECIP 87.9 MT (BLADE) ×3 IMPLANT
BLADE SAW SGTL 13X75X1.27 (BLADE) ×3 IMPLANT
BNDG ESMARK 6X9 LF (GAUZE/BANDAGES/DRESSINGS) ×3
BOOTCOVER CLEANROOM LRG (PROTECTIVE WEAR) ×6 IMPLANT
BOWL SMART MIX CTS (DISPOSABLE) ×3 IMPLANT
CAP KNEE TOTAL 3 SIGMA ×3 IMPLANT
CEMENT HV SMART SET (Cement) ×6 IMPLANT
CLOSURE STERI-STRIP 1/2X4 (GAUZE/BANDAGES/DRESSINGS) ×1
CLSR STERI-STRIP ANTIMIC 1/2X4 (GAUZE/BANDAGES/DRESSINGS) ×2 IMPLANT
COVER SURGICAL LIGHT HANDLE (MISCELLANEOUS) ×3 IMPLANT
CUFF TOURNIQUET SINGLE 34IN LL (TOURNIQUET CUFF) ×3 IMPLANT
DRAPE EXTREMITY T 121X128X90 (DRAPE) ×3 IMPLANT
DRAPE IMP U-DRAPE 54X76 (DRAPES) ×3 IMPLANT
DRAPE U-SHAPE 47X51 STRL (DRAPES) ×3 IMPLANT
DRSG PAD ABDOMINAL 8X10 ST (GAUZE/BANDAGES/DRESSINGS) ×3 IMPLANT
DURAPREP 26ML APPLICATOR (WOUND CARE) ×3 IMPLANT
ELECT CAUTERY BLADE 6.4 (BLADE) ×3 IMPLANT
ELECT REM PT RETURN 9FT ADLT (ELECTROSURGICAL) ×3
ELECTRODE REM PT RTRN 9FT ADLT (ELECTROSURGICAL) ×1 IMPLANT
GAUZE SPONGE 4X4 12PLY STRL (GAUZE/BANDAGES/DRESSINGS) ×3 IMPLANT
GLOVE BIOGEL PI IND STRL 8 (GLOVE) ×1 IMPLANT
GLOVE BIOGEL PI INDICATOR 8 (GLOVE) ×2
GLOVE BIOGEL PI ORTHO PRO SZ8 (GLOVE) ×2
GLOVE ORTHO TXT STRL SZ7.5 (GLOVE) ×3 IMPLANT
GLOVE PI ORTHO PRO STRL SZ8 (GLOVE) ×1 IMPLANT
GLOVE SURG ORTHO 8.0 STRL STRW (GLOVE) ×3 IMPLANT
GOWN STRL REUS W/ TWL XL LVL3 (GOWN DISPOSABLE) ×1 IMPLANT
GOWN STRL REUS W/TWL 2XL LVL3 (GOWN DISPOSABLE) ×3 IMPLANT
GOWN STRL REUS W/TWL XL LVL3 (GOWN DISPOSABLE) ×2
HANDPIECE INTERPULSE COAX TIP (DISPOSABLE) ×2
HOOD PEEL AWAY FACE SHEILD DIS (HOOD) ×6 IMPLANT
IMMOBILIZER KNEE 22 (SOFTGOODS) ×3 IMPLANT
KIT BASIN OR (CUSTOM PROCEDURE TRAY) ×3 IMPLANT
KIT ROOM TURNOVER OR (KITS) ×3 IMPLANT
MANIFOLD NEPTUNE II (INSTRUMENTS) ×3 IMPLANT
NEEDLE 18GX1X1/2 (RX/OR ONLY) (NEEDLE) ×3 IMPLANT
NS IRRIG 1000ML POUR BTL (IV SOLUTION) ×3 IMPLANT
PACK TOTAL JOINT (CUSTOM PROCEDURE TRAY) ×3 IMPLANT
PACK UNIVERSAL I (CUSTOM PROCEDURE TRAY) ×3 IMPLANT
PAD ABD 8X10 STRL (GAUZE/BANDAGES/DRESSINGS) ×3 IMPLANT
PAD ARMBOARD 7.5X6 YLW CONV (MISCELLANEOUS) ×6 IMPLANT
PAD CAST 4YDX4 CTTN HI CHSV (CAST SUPPLIES) ×1 IMPLANT
PADDING CAST COTTON 4X4 STRL (CAST SUPPLIES) ×2
PADDING CAST COTTON 6X4 STRL (CAST SUPPLIES) ×3 IMPLANT
SET HNDPC FAN SPRY TIP SCT (DISPOSABLE) ×1 IMPLANT
SUCTION FRAZIER TIP 10 FR DISP (SUCTIONS) ×3 IMPLANT
SUT MNCRL AB 4-0 PS2 18 (SUTURE) ×3 IMPLANT
SUT VIC AB 0 CT1 27 (SUTURE) ×2
SUT VIC AB 0 CT1 27XBRD ANBCTR (SUTURE) ×1 IMPLANT
SUT VIC AB 2-0 CT1 27 (SUTURE) ×2
SUT VIC AB 2-0 CT1 TAPERPNT 27 (SUTURE) ×1 IMPLANT
SUT VIC AB 3-0 SH 8-18 (SUTURE) ×6 IMPLANT
SYR 30ML LL (SYRINGE) IMPLANT
SYR 50ML LL SCALE MARK (SYRINGE) ×3 IMPLANT
TOWEL OR 17X24 6PK STRL BLUE (TOWEL DISPOSABLE) ×3 IMPLANT
TOWEL OR 17X26 10 PK STRL BLUE (TOWEL DISPOSABLE) ×3 IMPLANT
TRAY CATH 16FR W/PLASTIC CATH (SET/KITS/TRAYS/PACK) IMPLANT
WATER STERILE IRR 1000ML POUR (IV SOLUTION) ×6 IMPLANT

## 2014-12-06 NOTE — Anesthesia Procedure Notes (Addendum)
Spinal Patient location during procedure: OR Start time: 12/06/2014 7:36 AM End time: 12/06/2014 7:41 AM Staffing Anesthesiologist: Roderic Palau Performed by: anesthesiologist  Preanesthetic Checklist Completed: patient identified, surgical consent, pre-op evaluation, timeout performed, IV checked, risks and benefits discussed and monitors and equipment checked Spinal Block Patient position: sitting Prep: DuraPrep Patient monitoring: cardiac monitor, continuous pulse ox and blood pressure Approach: midline Location: L3-4 Injection technique: single-shot Needle Needle type: Pencan  Needle gauge: 24 G Needle length: 9 cm Assessment Sensory level: T8 Additional Notes Functioning IV was confirmed and monitors were applied. Sterile prep and drape, including hand hygiene and sterile gloves were used. The patient was positioned and the spine was prepped. The skin was anesthetized with lidocaine.  Free flow of clear CSF was obtained prior to injecting local anesthetic into the CSF.  The spinal needle aspirated freely following injection.  The needle was carefully withdrawn.  The patient tolerated the procedure well.   Procedure Name: MAC Date/Time: 12/06/2014 7:45 AM Performed by: Lowella Dell Pre-anesthesia Checklist: Patient identified, Emergency Drugs available, Suction available, Patient being monitored and Timeout performed Patient Re-evaluated:Patient Re-evaluated prior to inductionOxygen Delivery Method: Nasal cannula Intubation Type: IV induction

## 2014-12-06 NOTE — H&P (Signed)
PREOPERATIVE H&P  Chief Complaint: RT KNEE DJD  HPI: Frances Gray is a 64 y.o. female who presents for preoperative history and physical with a diagnosis of RT KNEE DJD. Symptoms are rated as moderate to severe, and have been worsening.  This is significantly impairing activities of daily living.  She has elected for surgical management. Previous successful result with left TKA.  She has failed injections, activity modification, anti-inflammatories, and assistive devices.  Preoperative X-rays demonstrate end stage degenerative changes with osteophyte formation, loss of joint space, subchondral sclerosis.   Past Medical History  Diagnosis Date  . Arthritis     L knee s/p TKR, R DJD knee  . Allergic rhinitis, seasonal     takes Claritin daily as needed and uses Flonase if needed  . Diverticulitis   . Heart murmur     no symptoms  . Anemia     has had anemia in the past. none now  . Vitamin D deficiency disease 08/14/2011    takes Vit D daily  . Osteoarthritis of left knee 07/03/2011  . Hyperlipidemia     was on Pravastatin but hasn't been taking in few months  . Hypertension     takes Hyzaar daily  . History of bronchitis winter of 2014  . Joint pain   . Joint swelling   . GERD (gastroesophageal reflux disease)     doesn't take any meds  . History of colon polyps     benign  . History of kidney stones    Past Surgical History  Procedure Laterality Date  . Knee arthroscopy      bilateral  . Lithotripsy  08/2011    R ureteral stone  . Total knee arthroplasty Left 11/16/2012    Procedure: TOTAL KNEE ARTHROPLASTY- left;  Surgeon: Johnny Bridge, MD;  Location: [facility];  Service: Orthopedics;  Laterality: Left;  . Breast ultrasound Right 03/02/13    The 52mm complex cyst in the right breast is probably benign. Follow-up in 6 months with bilateral mammogram and (R) ultrasound  . Breast ultrasound Right 09/02/13    There is a benign 3 mm complex cyst in the (R) breast at 9 o'clock   . Esophagus surgery  1996    tumor removed   (benign)  . Breast biopsy Left   . Colonoscopy     History   Social History  . Marital Status: Married    Spouse Name: N/A  . Number of Children: N/A  . Years of Education: N/A   Social History Main Topics  . Smoking status: Former Smoker -- 0.25 packs/day for 10 years  . Smokeless tobacco: Never Used     Comment: quit smoking 79yrs ago  . Alcohol Use: No  . Drug Use: No  . Sexual Activity: Yes    Birth Control/ Protection: Post-menopausal   Other Topics Concern  . None   Social History Narrative   Lives with spouse and 53 yo g-son, temporarily mother in law (05/2014)   Family History  Problem Relation Age of Onset  . Ovarian cancer Mother   . Colon cancer Sister 58  . Breast cancer Sister   . Esophageal cancer Neg Hx    Allergies  Allergen Reactions  . Amlodipine Cough  . Pravastatin     Joint ache   Prior to Admission medications   Medication Sig Start Date End Date Taking? Authorizing Provider  Biotin (BIOTIN 5000) 5 MG CAPS Take 5 mg by mouth every morning.  Yes Historical Provider, MD  calcium carbonate (CALCIUM 600) 600 MG TABS tablet Take 1 tablet (600 mg total) by mouth 2 (two) times daily with a meal. 05/12/14  Yes Rowe Clack, MD  Cholecalciferol (VITAMIN D3) 2000 UNITS TABS Take 2,000 Units by mouth every morning.    Yes Historical Provider, MD  FLUOCINOLONE ACETONIDE SCALP 0.01 % OIL Apply 1 mL topically daily as needed (itchy scalp).  09/13/14  Yes Historical Provider, MD  fluticasone (FLONASE) 50 MCG/ACT nasal spray Place 1 spray into both nostrils daily as needed for allergies or rhinitis.  10/05/14  Yes Historical Provider, MD  loratadine (CLARITIN) 10 MG tablet Take 10 mg by mouth daily as needed for allergies.   Yes Historical Provider, MD  losartan-hydrochlorothiazide (HYZAAR) 100-25 MG per tablet Take 1 tablet by mouth daily. 05/12/14  Yes Rowe Clack, MD  Multiple Vitamin (MULTIVITAMIN)  tablet Take 1 tablet by mouth every morning.    Yes Historical Provider, MD  pravastatin (PRAVACHOL) 20 MG tablet Take 1 tablet (20 mg total) by mouth daily. Patient not taking: Reported on 11/23/2014 05/14/14   Rowe Clack, MD     Positive ROS: All other systems have been reviewed and were otherwise negative with the exception of those mentioned in the HPI and as above.  Physical Exam: General: Alert, no acute distress Cardiovascular: No pedal edema Respiratory: No cyanosis, no use of accessory musculature GI: No organomegaly, abdomen is soft and non-tender Skin: No lesions in the area of chief complaint Neurologic: Sensation intact distally Psychiatric: Patient is competent for consent with normal mood and affect Lymphatic: No axillary or cervical lymphadenopathy  MUSCULOSKELETAL: 10-90 deg, positive crepitance, quad and hamstrings intact  Assessment: RT KNEE DJD  Plan: Plan for Procedure(s): TOTAL KNEE ARTHROPLASTY  The risks benefits and alternatives were discussed with the patient including but not limited to the risks of nonoperative treatment, versus surgical intervention including infection, bleeding, nerve injury,  blood clots, cardiopulmonary complications, morbidity, mortality, among others, and they were willing to proceed.   Johnny Bridge, MD Cell [phone]   12/06/2014 7:13 AM

## 2014-12-06 NOTE — Care Management (Signed)
Utilization review completed by Verlee Rossetti, RN BSN [phone]

## 2014-12-06 NOTE — Anesthesia Preprocedure Evaluation (Addendum)
Anesthesia Evaluation  Patient identified by MRN, date of birth, ID band Patient awake    Reviewed: Allergy & Precautions, H&P , NPO status , Patient's Chart, lab work & pertinent test results  Airway Mallampati: III  TM Distance: >3 FB Neck ROM: Full    Dental no notable dental hx. (+) Teeth Intact, Dental Advisory Given   Pulmonary neg pulmonary ROS, former smoker,  breath sounds clear to auscultation  Pulmonary exam normal       Cardiovascular hypertension, Pt. on medications Rhythm:Regular Rate:Normal     Neuro/Psych negative neurological ROS  negative psych ROS   GI/Hepatic negative GI ROS, Neg liver ROS,   Endo/Other  negative endocrine ROSMorbid obesity  Renal/GU Renal disease  negative genitourinary   Musculoskeletal  (+) Arthritis -, Osteoarthritis,    Abdominal   Peds  Hematology negative hematology ROS (+)   Anesthesia Other Findings   Reproductive/Obstetrics negative OB ROS                            Anesthesia Physical Anesthesia Plan  ASA: III  Anesthesia Plan: MAC and Spinal   Post-op Pain Management:    Induction: Intravenous  Airway Management Planned: Simple Face Mask  Additional Equipment:   Intra-op Plan:   Post-operative Plan:   Informed Consent: I have reviewed the patients History and Physical, chart, labs and discussed the procedure including the risks, benefits and alternatives for the proposed anesthesia with the patient or authorized representative who has indicated his/her understanding and acceptance.   Dental advisory given  Plan Discussed with: CRNA and Surgeon  Anesthesia Plan Comments:        Anesthesia Quick Evaluation

## 2014-12-06 NOTE — Transfer of Care (Signed)
Immediate Anesthesia Transfer of Care Note  Patient: Frances Gray  Procedure(s) Performed: Procedure(s): TOTAL KNEE ARTHROPLASTY (Right)  Patient Location: PACU  Anesthesia Type:MAC  Level of Consciousness: awake, alert , oriented and patient cooperative  Airway & Oxygen Therapy: Patient Spontanous Breathing and Patient connected to nasal cannula oxygen  Post-op Assessment: Report given to RN and Post -op Vital signs reviewed and stable  Post vital signs: Reviewed and stable  Last Vitals:  BP 108/56 (65) HR 67 SpO2 100 on 2L  RR 18  Complications: No apparent anesthesia complications

## 2014-12-06 NOTE — Evaluation (Signed)
Physical Therapy Evaluation Patient Details Name: Frances Gray MRN: [id number] DOB: [birth year] Today's Date: 12/06/2014   History of Present Illness  Patient is a 64 y/o female s/p R TKA. PMH includes HTN, HLD, s/p L TKA 2014.  Clinical Impression  Patient presents with pain and post surgical deficits RLE s/p R TKA impacting mobility. Pt tolerated short distance ambulation with Min A for balance/safety due to impaired sensation RLE. Reviewed exercises and precautions. Pt will not have necessary support at home at d/c. Would benefit from ST SNF to maximize independence and mobility so pt can return to PLOF.     Follow Up Recommendations SNF;Supervision/Assistance - 24 hour    Equipment Recommendations  None recommended by PT    Recommendations for Other Services       Precautions / Restrictions Precautions Precautions: Knee;Fall Precaution Booklet Issued: No Precaution Comments: Reviewed no pillow under knee. Required Braces or Orthoses: Knee Immobilizer - Right Knee Immobilizer - Right: Other (comment);On except when in CPM (On at all times in bed unless in CPM.) Restrictions Weight Bearing Restrictions: Yes RLE Weight Bearing: Weight bearing as tolerated      Mobility  Bed Mobility Overal bed mobility: Needs Assistance Bed Mobility: Supine to Sit     Supine to sit: Min assist;HOB elevated     General bed mobility comments: Min A to bring RLE to EOB. Use of rails.   Transfers Overall transfer level: Needs assistance Equipment used: Rolling walker (2 wheeled) Transfers: Sit to/from Stand Sit to Stand: Min assist         General transfer comment: Min A to boost from EOB with difficulty transitioning UEs from surface to RW. Unsteady with right knee instability (KI donned).  Ambulation/Gait Ambulation/Gait assistance: Min assist Ambulation Distance (Feet): 7 Feet Assistive device: Rolling walker (2 wheeled) Gait Pattern/deviations: Step-to pattern;Decreased  stance time - right;Decreased step length - left;Trunk flexed   Gait velocity interpretation: Below normal speed for age/gender General Gait Details: Increased WB through BUEs secondary to right knee instability.   Stairs            Wheelchair Mobility    Modified Rankin (Stroke Patients Only)       Balance Overall balance assessment: Needs assistance Sitting-balance support: Feet supported;No upper extremity supported Sitting balance-Leahy Scale: Good     Standing balance support: During functional activity Standing balance-Leahy Scale: Poor Standing balance comment: Relient on RW.                             Pertinent Vitals/Pain Pain Assessment: Faces Faces Pain Scale: Hurts even more Pain Location: right knee Pain Descriptors / Indicators: Sore;Aching Pain Intervention(s): Monitored during session;Limited activity within patient's tolerance;Patient requesting pain meds-RN notified;Repositioned    Home Living Family/patient expects to be discharged to:: Skilled nursing facility                      Prior Function Level of Independence: Independent               Hand Dominance   Dominant Hand: Right    Extremity/Trunk Assessment   Upper Extremity Assessment: Defer to OT evaluation           Lower Extremity Assessment: RLE deficits/detail RLE Deficits / Details: Limited AROM/strength secondary to pain and surgery.       Communication   Communication: No difficulties  Cognition Arousal/Alertness: Awake/alert Behavior During Therapy: WFL for tasks assessed/performed  Overall Cognitive Status: Within Functional Limits for tasks assessed                      General Comments General comments (skin integrity, edema, etc.): Pt's spouse present in room during evaluation.    Exercises Total Joint Exercises Ankle Circles/Pumps: Both;10 reps;Seated Quad Sets: Both;10 reps;Seated Gluteal Sets: Both;10 reps;Seated       Assessment/Plan    PT Assessment Patient needs continued PT services  PT Diagnosis Acute pain;Difficulty walking   PT Problem List Decreased strength;Decreased range of motion;Decreased balance;Decreased mobility;Decreased activity tolerance;Pain;Impaired sensation  PT Treatment Interventions Balance training;Gait training;Stair training;Functional mobility training;Therapeutic activities;Therapeutic exercise;Patient/family education;DME instruction   PT Goals (Current goals can be found in the Care Plan section) Acute Rehab PT Goals Patient Stated Goal: to go to rehab to get stronger PT Goal Formulation: With patient/family Time For Goal Achievement: 12/20/14 Potential to Achieve Goals: Good    Frequency 7X/week   Barriers to discharge Decreased caregiver support Pt's spouse not able to assist at home due to needing to take care of 37 y/o grandson - recently sick.    Co-evaluation               End of Session Equipment Utilized During Treatment: Gait belt;Right knee immobilizer;Oxygen Activity Tolerance: Patient limited by pain;Patient tolerated treatment well Patient left: in chair;with call bell/phone within reach;with family/visitor present Nurse Communication: Mobility status         Time: 6553-7482 PT Time Calculation (min) (ACUTE ONLY): 23 min   Charges:   PT Evaluation $Initial PT Evaluation Tier I: 1 Procedure PT Treatments $Therapeutic Activity: 8-22 mins   PT G Codes:        Lucella Pommier A Juliahna Wiswell 12/06/2014, 3:34 PM Wray Kearns, [place], DPT [phone]

## 2014-12-06 NOTE — Progress Notes (Signed)
Report given  to [facility] rn as cargiver

## 2014-12-06 NOTE — Op Note (Addendum)
DATE OF SURGERY:  12/06/2014 TIME: 9:40 AM  PATIENT NAME:  Frances Gray   AGE: 64 y.o.    PRE-OPERATIVE DIAGNOSIS:  Right knee primary localized osteoarthritis  POST-OPERATIVE DIAGNOSIS:  Same  PROCEDURE:  Procedure(s): Right TOTAL KNEE ARTHROPLASTY  SURGEON:  Johnny Bridge, MD   ASSISTANT:  Joya Gaskins, OPA-C, present and scrubbed throughout the case, critical for assistance with exposure, retraction, instrumentation, and closure.  Second assistant: Morley Kos, O PA-C  OPERATIVE IMPLANTS: Depuy PFC Sigma, Posterior Stabilized.  Femur size 3, Tibia size 2.5, Patella size 35 3-peg oval button, with a 10 mm polyethylene insert.  Anesthesia: Spinal with Exparel with marcaine injected deep and superficial  PREOPERATIVE INDICATIONS:  Rickia Freeburg is a 64 y.o. year old female with end stage bone on bone degenerative arthritis of the knee who failed conservative treatment, including injections, antiinflammatories, activity modification, and assistive devices, and had significant impairment of their activities of daily living, and elected for Total Knee Arthroplasty. She also had coexisting morbid obesity with a BMI of 45. I counseled her on the increased risks when considering surgical intervention with the coexisting comorbidities, and she elected to proceed despite the added risk and complexity.  The risks, benefits, and alternatives were discussed at length including but not limited to the risks of infection, bleeding, nerve injury, stiffness, blood clots, the need for revision surgery, cardiopulmonary complications, among others, and they were willing to proceed.  OPERATIVE FINDINGS AND UNIQUE ASPECTS OF THE CASE:  I selected a 2.5 mm tibia, because I felt that the previous implant on the contralateral side may have been slightly oversized, matching her osteophytes, rather than the native bone. The 2.5 fit well after excision of the osteophytes on the tibia. There was a large  loose body. Her range of motion was extremely poor preoperatively, going from 10 to 90 at most. Afterwards I was able to reach full extension. I did resect 11 mm off of the distal femur to minimize the risk for postoperative flexion contracture.  My tibial jig was set at 2 clicks over, and 18 posterior slope. The anterior femoral cut was just flush with the distal tibia, and did not notch. Access was certainly challenging given her obesity.  OPERATIVE DESCRIPTION:  The patient was brought to the operative room and placed in a supine position.  Spinal anesthesia was administered.  IV antibiotics were given.  The lower extremity was prepped and draped in the usual sterile fashion.  Time out was performed.  The leg was elevated and exsanguinated and the tourniquet was inflated.  Anterior quadriceps tendon splitting approach was performed.  The patella was everted and osteophytes were removed.  The anterior horn of the medial and lateral meniscus was removed.   The distal femur was opened with the drill and the intramedullary distal femoral cutting jig was utilized, set at 5 degrees resecting 11 mm off the distal femur.  Care was taken to protect the collateral ligaments.  Then the extramedullary tibial cutting jig was utilized making the appropriate cut using the anterior tibial crest as a reference building in appropriate posterior slope.  Care was taken during the cut to protect the medial and collateral ligaments.  The proximal tibia was removed along with the posterior horns of the menisci.  The PCL was sacrificed.    The extensor gap was measured and was approximately 25mm.    The distal femoral sizing jig was applied, taking care to avoid notching.  Then the 4-in-1 cutting jig was  applied and the anterior and posterior femur was cut, along with the chamfer cuts.  All posterior osteophytes were removed.  The flexion gap was then measured and was symmetric with the extension gap.  I completed  the distal femoral preparation using the appropriate jig to prepare the box.  The patella was then measured, and cut with the saw.  The thickness before the cut was 25 and after the cut was 16.  The proximal tibia sized and prepared accordingly with the reamer and the punch, although initially the 4-5 punch was loaded, but we quickly realized after it would not seat, and reloaded the 2-3 punch, and then all components were trialed with the 25mm poly insert.  The knee was found to have excellent balance and full motion.    The above named components were then cemented into place and all excess cement was removed.  The footed tibial impactor had some residual cement, so we discarded the impactor, and impacted the real tibia placing it by hand and using the secondary impactor.  The real polyethylene implant was placed.  After the cement had cured I released the tourniquet and confirmed excellent hemostasis with no major posterior vessel injury.    The knee was easily taken through a range of motion and the patella tracked well and the knee irrigated copiously and the parapatellar and subcutaneous tissue closed with vicryl, and monocryl with steri strips for the skin.  The wounds were injected with marcaine, and dressed with sterile gauze and the patient was awakened and returned to the PACU in stable and satisfactory condition.  There were no complications.  Total tourniquet time was ~90 minutes.

## 2014-12-06 NOTE — Anesthesia Postprocedure Evaluation (Signed)
  Anesthesia Post-op Note  Patient: Frances Gray  Procedure(s) Performed: Procedure(s): TOTAL KNEE ARTHROPLASTY (Right)  Patient Location: PACU  Anesthesia Type: Spinal/MAC  Level of Consciousness: awake and alert   Airway and Oxygen Therapy: Patient Spontanous Breathing  Post-op Pain: mild  Post-op Assessment: Post-op Vital signs reviewed, Patient's Cardiovascular Status Stable and Respiratory Function Stable. Motor block receeding.  Post-op Vital Signs: Reviewed  Filed Vitals:   12/06/14 1230  BP:   Pulse: 58  Temp:   Resp: 12    Complications: No apparent anesthesia complications

## 2014-12-07 ENCOUNTER — Encounter (HOSPITAL_COMMUNITY): Payer: Self-pay | Admitting: Orthopedic Surgery

## 2014-12-07 LAB — BASIC METABOLIC PANEL
Anion gap: 10 (ref 5–15)
BUN: 12 mg/dL (ref 6–20)
CHLORIDE: 97 mmol/L — AB (ref 101–111)
CO2: 26 mmol/L (ref 22–32)
Calcium: 8.6 mg/dL — ABNORMAL LOW (ref 8.9–10.3)
Creatinine, Ser: 0.8 mg/dL (ref 0.44–1.00)
GFR calc Af Amer: >60 mL/min (ref >60)
GFR calc non Af Amer: >60 mL/min (ref >60)
GLUCOSE: 118 mg/dL — AB (ref 65–99)
Potassium: 3.5 mmol/L (ref 3.5–5.1)
Sodium: 133 mmol/L — ABNORMAL LOW (ref 135–145)

## 2014-12-07 LAB — CBC
HCT: 32.2 % — ABNORMAL LOW (ref 36.0–46.0)
Hemoglobin: 10.5 g/dL — ABNORMAL LOW (ref 12.0–15.0)
MCH: 26.6 pg (ref 26.0–34.0)
MCHC: 32.6 g/dL (ref 30.0–36.0)
MCV: 81.5 fL (ref 78.0–100.0)
PLATELETS: 302 10*3/uL (ref 150–400)
RBC: 3.95 MIL/uL (ref 3.87–5.11)
RDW: 14 % (ref 11.5–15.5)
WBC: 9 10*3/uL (ref 4.0–10.5)

## 2014-12-07 NOTE — Clinical Social Work Placement (Signed)
   CLINICAL SOCIAL WORK PLACEMENT  NOTE  Date:  12/07/2014  Patient Details  Name: Frances Gray MRN: [id number] Date of Birth: 1950/08/11  Clinical Social Work is seeking post-discharge placement for this patient at the [place] level of care (*CSW will initial, date and re-position this form in  chart as items are completed):  Yes   Patient/family provided with [facility]'s list of facilities offering this level of care within the geographic area requested by the patient (or if unable, by the patient's family).  Yes   Patient/family informed of their freedom to choose among providers that offer the needed level of care, that participate in Medicare, Medicaid or managed care program needed by the patient, have an available bed and are willing to accept the patient.  Yes   Patient/family informed of [facility]'s ownership interest in [facility] and [facility], as well as of the fact that they are under no obligation to receive care at these facilities.  PASRR submitted to EDS on 12/07/14     PASRR number received on 12/07/14     Existing PASRR number confirmed on  (n/a)     FL2 transmitted to all facilities in geographic area requested by pt/family on 12/07/14     FL2 transmitted to all facilities within larger geographic area on  (n/a)     Patient informed that his/her managed care company has contracts with or will negotiate with certain facilities, including the following:   (yes, [facility])         Patient/family informed of bed offers received.  Patient chooses bed at       Physician recommends and patient chooses bed at      Patient to be transferred to   on  .  Patient to be transferred to facility by       Patient family notified on   of transfer.  Name of family member notified:        PHYSICIAN Please sign FL2     Additional Comment:     _______________________________________________ Caroline Sauger, LCSW 12/07/2014, 3:30 PM [phone]

## 2014-12-07 NOTE — Discharge Instructions (Signed)
INSTRUCTIONS AFTER JOINT REPLACEMENT  ° °o Remove items at home which could result in a fall. This includes throw rugs or furniture in walking pathways °o ICE to the affected joint every three hours while awake for 30 minutes at a time, for at least the first 3-5 days, and then as needed for pain and swelling.  Continue to use ice for pain and swelling. You may notice swelling that will progress down to the foot and ankle.  This is normal after surgery.  Elevate your leg when you are not up walking on it.   °o Continue to use the breathing machine you got in the hospital (incentive spirometer) which will help keep your temperature down.  It is common for your temperature to cycle up and down following surgery, especially at night when you are not up moving around and exerting yourself.  The breathing machine keeps your lungs expanded and your temperature down. ° ° °DIET:  As you were doing prior to hospitalization, we recommend a well-balanced diet. ° °DRESSING / WOUND CARE / SHOWERING ° °You may change your dressing 3-5 days after surgery.  Then change the dressing every day with sterile gauze.  Please use good hand washing techniques before changing the dressing.  Do not use any lotions or creams on the incision until instructed by your surgeon. ° °ACTIVITY ° °o Increase activity slowly as tolerated, but follow the weight bearing instructions below.   °o No driving for 6 weeks or until further direction given by your physician.  You cannot drive while taking narcotics.  °o No lifting or carrying greater than 10 lbs. until further directed by your surgeon. °o Avoid periods of inactivity such as sitting longer than an hour when not asleep. This helps prevent blood clots.  °o You may return to work once you are authorized by your doctor.  ° ° ° °WEIGHT BEARING  ° °Weight bearing as tolerated with assist device (walker, cane, etc) as directed, use it as long as suggested by your surgeon or therapist, typically at  least 4-6 weeks. ° ° °EXERCISES ° °Results after joint replacement surgery are often greatly improved when you follow the exercise, range of motion and muscle strengthening exercises prescribed by your doctor. Safety measures are also important to protect the joint from further injury. Any time any of these exercises cause you to have increased pain or swelling, decrease what you are doing until you are comfortable again and then slowly increase them. If you have problems or questions, call your caregiver or physical therapist for advice.  ° °Rehabilitation is important following a joint replacement. After just a few days of immobilization, the muscles of the leg can become weakened and shrink (atrophy).  These exercises are designed to build up the tone and strength of the thigh and leg muscles and to improve motion. Often times heat used for twenty to thirty minutes before working out will loosen up your tissues and help with improving the range of motion but do not use heat for the first two weeks following surgery (sometimes heat can increase post-operative swelling).  ° °These exercises can be done on a training (exercise) mat, on the floor, on a table or on a bed. Use whatever works the best and is most comfortable for you.    Use music or television while you are exercising so that the exercises are a pleasant break in your day. This will make your life better with the exercises acting as a break   in your routine that you can look forward to.   Perform all exercises about fifteen times, three times per day or as directed.  You should exercise both the operative leg and the other leg as well. ° °Exercises include: °  °• Quad Sets - Tighten up the muscle on the front of the thigh (Quad) and hold for 5-10 seconds.   °• Straight Leg Raises - With your knee straight (if you were given a brace, keep it on), lift the leg to 60 degrees, hold for 3 seconds, and slowly lower the leg.  Perform this exercise against  resistance later as your leg gets stronger.  °• Leg Slides: Lying on your back, slowly slide your foot toward your buttocks, bending your knee up off the floor (only go as far as is comfortable). Then slowly slide your foot back down until your leg is flat on the floor again.  °• Angel Wings: Lying on your back spread your legs to the side as far apart as you can without causing discomfort.  °• Hamstring Strength:  Lying on your back, push your heel against the floor with your leg straight by tightening up the muscles of your buttocks.  Repeat, but this time bend your knee to a comfortable angle, and push your heel against the floor.  You may put a pillow under the heel to make it more comfortable if necessary.  ° °A rehabilitation program following joint replacement surgery can speed recovery and prevent re-injury in the future due to weakened muscles. Contact your doctor or a physical therapist for more information on knee rehabilitation.  ° ° °CONSTIPATION ° °Constipation is defined medically as fewer than three stools per week and severe constipation as less than one stool per week.  Even if you have a regular bowel pattern at home, your normal regimen is likely to be disrupted due to multiple reasons following surgery.  Combination of anesthesia, postoperative narcotics, change in appetite and fluid intake all can affect your bowels.  ° °YOU MUST use at least one of the following options; they are listed in order of increasing strength to get the job done.  They are all available over the counter, and you may need to use some, POSSIBLY even all of these options:   ° °Drink plenty of fluids (prune juice may be helpful) and high fiber foods °Colace 100 mg by mouth twice a day  °Senokot for constipation as directed and as needed Dulcolax (bisacodyl), take with full glass of water  °Miralax (polyethylene glycol) once or twice a day as needed. ° °If you have tried all these things and are unable to have a bowel  movement in the first 3-4 days after surgery call either your surgeon or your primary doctor.   ° °If you experience loose stools or diarrhea, hold the medications until you stool forms back up.  If your symptoms do not get better within 1 week or if they get worse, check with your doctor.  If you experience "the worst abdominal pain ever" or develop nausea or vomiting, please contact the office immediately for further recommendations for treatment. ° ° °ITCHING:  If you experience itching with your medications, try taking only a single pain pill, or even half a pain pill at a time.  You can also use Benadryl over the counter for itching or also to help with sleep.  ° °TED HOSE STOCKINGS:  Use stockings on both legs until for at least 2 weeks or as   directed by physician office. They may be removed at night for sleeping.  MEDICATIONS:  See your medication summary on the After Visit Summary that nursing will review with you.  You may have some home medications which will be placed on hold until you complete the course of blood thinner medication.  It is important for you to complete the blood thinner medication as prescribed.  PRECAUTIONS:  If you experience chest pain or shortness of breath - call 911 immediately for transfer to the hospital emergency department.   If you develop a fever greater that 101 F, purulent drainage from wound, increased redness or drainage from wound, foul odor from the wound/dressing, or calf pain - CONTACT YOUR SURGEON.                                                   FOLLOW-UP APPOINTMENTS:  If you do not already have a post-op appointment, please call the office for an appointment to be seen by your surgeon.  Guidelines for how soon to be seen are listed in your After Visit Summary, but are typically between 1-4 weeks after surgery.  OTHER INSTRUCTIONS:   Knee Replacement:  Do not place pillow under knee, focus on keeping the knee straight while resting. CPM  instructions: 0-90 degrees, 2 hours in the morning, 2 hours in the afternoon, and 2 hours in the evening. Place foam block, curve side up under heel at all times except when in CPM or when walking.  DO NOT modify, tear, cut, or change the foam block in any way.  MAKE SURE YOU:   Understand these instructions.   Get help right away if you are not doing well or get worse.    Thank you for letting us be a part of your medical care team.  It is a privilege we respect greatly.  We hope these instructions will help you stay on track for a fast and full recovery!   Information on my medicine - XARELTO (Rivaroxaban)  This medication education was reviewed with me or my healthcare representative as part of my discharge preparation.  The pharmacist that spoke with me during my hospital stay was:  Beverlee Nims, [facility]  Why was Xarelto prescribed for you? Xarelto was prescribed for you to reduce the risk of blood clots forming after orthopedic surgery. The medical term for these abnormal blood clots is venous thromboembolism (VTE).  What do you need to know about xarelto ? Take your Xarelto ONCE DAILY at the same time every day. You may take it either with or without food.  If you have difficulty swallowing the tablet whole, you may crush it and mix in applesauce just prior to taking your dose.  Take Xarelto exactly as prescribed by your doctor and DO NOT stop taking Xarelto without talking to the doctor who prescribed the medication.  Stopping without other VTE prevention medication to take the place of Xarelto may increase your risk of developing a clot.  After discharge, you should have regular check-up appointments with your healthcare provider that is prescribing your Xarelto.    What do you do if you miss a dose? If you miss a dose, take it as soon as you remember on the same day then continue your regularly scheduled once daily regimen the next day. Do not take two doses of  Xarelto on the same day.   Important Safety Information A possible side effect of Xarelto is bleeding. You should call your healthcare provider right away if you experience any of the following: ? Bleeding from an injury or your nose that does not stop. ? Unusual colored urine (red or dark brown) or unusual colored stools (red or black). ? Unusual bruising for unknown reasons. ? A serious fall or if you hit your head (even if there is no bleeding).  Some medicines may interact with Xarelto and might increase your risk of bleeding while on Xarelto. To help avoid this, consult your healthcare provider or pharmacist prior to using any new prescription or non-prescription medications, including herbals, vitamins, non-steroidal anti-inflammatory drugs (NSAIDs) and supplements.  This website has more information on Xarelto: [email or web address]

## 2014-12-07 NOTE — Progress Notes (Signed)
     Subjective:  Patient reports pain as moderate.  Sitting up in a chair, doing well. Wants to go to [facility].  Objective:   VITALS:   Filed Vitals:   12/06/14 1335 12/06/14 2200 12/07/14 0117 12/07/14 0553  BP: 112/59 148/78 146/66 116/60  Pulse: 68 72 65 69  Temp: 97.8 F (36.6 C) 97.6 F (36.4 C) 97.8 F (36.6 C) 98.1 F (36.7 C)  TempSrc:  Oral    Resp: 16 17 16 18   SpO2: 96% 98% 100% 100%    Neurologically intact Dorsiflexion/Plantar flexion intact Incision: dressing C/D/I   Lab Results  Component Value Date   WBC 9.0 12/07/2014   HGB 10.5* 12/07/2014   HCT 32.2* 12/07/2014   MCV 81.5 12/07/2014   PLT 302 12/07/2014   BMET    Component Value Date/Time   NA 133* 12/07/2014 0544   K 3.5 12/07/2014 0544   CL 97* 12/07/2014 0544   CO2 26 12/07/2014 0544   GLUCOSE 118* 12/07/2014 0544   BUN 12 12/07/2014 0544   CREATININE 0.80 12/07/2014 0544   CALCIUM 8.6* 12/07/2014 0544   CALCIUM 9.8 11/28/2011 1244   GFRNONAA >60 12/07/2014 0544   GFRAA >60 12/07/2014 0544     Assessment/Plan: 1 Day Post-Op   Principal Problem:   Primary localized osteoarthritis of right knee Active Problems:   Severe obesity (BMI >= 40)   Hypertension   Knee osteoarthritis   Advance diet Up with therapy Discharge to SNF probably Friday.   Can Lucci P 12/07/2014, 8:12 AM   Marchia Bond, MD Cell [phone]

## 2014-12-07 NOTE — Progress Notes (Signed)
Physical Therapy Treatment Patient Details Name: Frances Gray MRN: [id number] DOB: 05/17/1951 Today's Date: 12/07/2014    History of Present Illness Patient is a 64 y/o female s/p R TKA. PMH includes HTN, HLD, s/p L TKA 2014.    PT Comments    Patient progressing well towards PT goals. Improved ambulation distance however still requires Min guard for safety. Instructed pt in HEP and provided handout. Appropriate for ST SNF to improve safety. Will continue to follow to maximize independence and mobility.   Follow Up Recommendations  SNF;Supervision/Assistance - 24 hour     Equipment Recommendations  None recommended by PT    Recommendations for Other Services       Precautions / Restrictions Precautions Precautions: Knee;Fall Precaution Booklet Issued: Yes (comment) Precaution Comments: Reviewed no pillow under knee. Required Braces or Orthoses: Knee Immobilizer - Right Knee Immobilizer - Right: Other (comment);On except when in CPM Restrictions Weight Bearing Restrictions: Yes RLE Weight Bearing: Weight bearing as tolerated    Mobility  Bed Mobility Overal bed mobility: Needs Assistance Bed Mobility: Sit to Supine       Sit to supine: Min guard   General bed mobility comments: Pt able to bring RLE into bed with increased time and cues using UEs for support and LLE to assist. HOB flat, no rails to simulate home.  Transfers Overall transfer level: Needs assistance Equipment used: Rolling walker (2 wheeled) Transfers: Sit to/from Stand Sit to Stand: Min guard         General transfer comment: Min guard for safety. Cues for hand placement.   Ambulation/Gait Ambulation/Gait assistance: Min guard Ambulation Distance (Feet): 75 Feet Assistive device: Rolling walker (2 wheeled) Gait Pattern/deviations: Step-to pattern;Step-through pattern;Decreased stance time - right;Decreased step length - left;Trunk flexed;Antalgic   Gait velocity interpretation: Below  normal speed for age/gender General Gait Details: Cues for knee extension during stance phase for quad activation. Short standing rest breaks. Increased pain with WB.   Stairs            Wheelchair Mobility    Modified Rankin (Stroke Patients Only)       Balance Overall balance assessment: Needs assistance Sitting-balance support: Feet supported;No upper extremity supported Sitting balance-Leahy Scale: Good     Standing balance support: During functional activity Standing balance-Leahy Scale: Poor                      Cognition Arousal/Alertness: Awake/alert Behavior During Therapy: WFL for tasks assessed/performed Overall Cognitive Status: Within Functional Limits for tasks assessed                      Exercises Total Joint Exercises Ankle Circles/Pumps: Both;15 reps;Supine Quad Sets: 10 reps;Both;Supine Towel Squeeze: Both;10 reps;Supine Heel Slides: Right;10 reps;Supine Hip ABduction/ADduction: Right;10 reps;Supine Knee Flexion: Right;AROM Goniometric ROM: 7- 70 degrees knee AROM    General Comments        Pertinent Vitals/Pain Pain Assessment: Faces Faces Pain Scale: Hurts even more Pain Location: right knee Pain Descriptors / Indicators: Sore;Aching Pain Intervention(s): Monitored during session;Repositioned    Home Living                      Prior Function            PT Goals (current goals can now be found in the care plan section) Progress towards PT goals: Progressing toward goals    Frequency  7X/week    PT Plan Current plan remains  appropriate    Co-evaluation             End of Session Equipment Utilized During Treatment: Gait belt Activity Tolerance: Patient limited by pain;Patient tolerated treatment well Patient left: in bed;with call bell/phone within reach     Time: 0952-1015 PT Time Calculation (min) (ACUTE ONLY): 23 min  Charges:  $Gait Training: 8-22 mins $Therapeutic Exercise: 8-22  mins                    G Codes:      [place] 12/07/2014, 10:43 AM Wray Kearns, PT, DPT [phone]

## 2014-12-07 NOTE — Evaluation (Signed)
Occupational Therapy Evaluation Patient Details Name: Frances Gray MRN: [id number] DOB: [birth year] Today's Date: 12/07/2014    History of Present Illness Patient is a 64 y/o female s/p R TKA. PMH includes HTN, HLD, s/p L TKA 2014.   Clinical Impression   Pt currently min assist level for functional transfers with min to mod assist for LB selfcare tasks at this time.  Feel she will benefit from acute care OT to help increase overall independence, with transition to SNF for short term rehab.  Pt will make excellent progress and has good family support. DME needs to be decided next venue of care.      Follow Up Recommendations  SNF;Supervision/Assistance - 24 hour    Equipment Recommendations  Other (comment) (TBD next venue of care)       Precautions / Restrictions Precautions Precautions: Knee;Fall Precaution Comments: Reviewed no pillow under knee. Required Braces or Orthoses: Knee Immobilizer - Right Knee Immobilizer - Right: Other (comment);On except when in CPM Restrictions Weight Bearing Restrictions: No RLE Weight Bearing: Weight bearing as tolerated      Mobility Bed Mobility Overal bed mobility: Needs Assistance Bed Mobility: Supine to Sit     Supine to sit: Min assist;HOB elevated Sit to supine: Min assist;HOB elevated      Transfers Overall transfer level: Needs assistance Equipment used: Rolling walker (2 wheeled) Transfers: Sit to/from [facility] Sit to Stand: Min assist Stand pivot transfers: Min assist       General transfer comment: Min instructional cueing for hand placement.    Balance Overall balance assessment: Needs assistance   Sitting balance-Leahy Scale: Good       Standing balance-Leahy Scale: Poor                              ADL Overall ADL's : Needs assistance/impaired Eating/Feeding: Independent;Sitting   Grooming: Wash/dry hands;Minimal assistance;Standing   Upper Body Bathing: Supervision/  safety;Sitting   Lower Body Bathing: Minimal assistance;Sit to/from stand   Upper Body Dressing : Supervision/safety;Sitting   Lower Body Dressing: Maximal assistance;Sit to/from stand   Toilet Transfer: Minimal assistance;BSC;Ambulation;RW   Toileting- Clothing Manipulation and Hygiene: Minimal assistance;Sit to/from stand       Functional mobility during ADLs: Minimal assistance;Rolling walker General ADL Comments: Pt with history of decreased flexibility and limited ability to reach her feet for selfcare tasks.  Both her and her husband report purchasing AE from prior knee surgery and having it available.  This therapist feel this will be necessary for pt to increase independence. `     Vision Vision Assessment?: No apparent visual deficits   Perception Perception Perception Tested?: No   Praxis Praxis Praxis tested?: Not tested    Pertinent Vitals/Pain Pain Assessment: 0-10 Pain Score: 7  Pain Location: right knee Pain Intervention(s): Limited activity within patient's tolerance;Repositioned     Hand Dominance Right   Extremity/Trunk Assessment Upper Extremity Assessment Upper Extremity Assessment: Overall WFL for tasks assessed   Lower Extremity Assessment Lower Extremity Assessment: Defer to PT evaluation   Cervical / Trunk Assessment Cervical / Trunk Assessment: Normal   Communication Communication Communication: No difficulties   Cognition Arousal/Alertness: Awake/alert Behavior During Therapy: WFL for tasks assessed/performed Overall Cognitive Status: Within Functional Limits for tasks assessed                                Home Living  Family/patient expects to be discharged to:: Skilled nursing facility Living Arrangements: Spouse/significant other Available Help at Discharge: Family Type of Home: House Home Access: Stairs to enter     Home Layout: One level     Bathroom Shower/Tub: Occupational psychologist: Standard                 Prior Functioning/Environment Level of Independence: Independent             OT Diagnosis: Generalized weakness;Acute pain   OT Problem List: Decreased strength;Decreased activity tolerance;Impaired balance (sitting and/or standing);Pain;Decreased knowledge of use of DME or AE   OT Treatment/Interventions: Self-care/ADL training;Balance training;Therapeutic activities;DME and/or AE instruction;Patient/family education    OT Goals(Current goals can be found in the care plan section) Acute Rehab OT Goals Patient Stated Goal: To get her pain better and get back to being independent OT Goal Formulation: With patient/family Time For Goal Achievement: 12/14/14 Potential to Achieve Goals: Good  OT Frequency: Min 2X/week              End of Session Equipment Utilized During Treatment: Gait belt;Rolling walker;Right knee immobilizer Nurse Communication: Mobility status  Activity Tolerance: Patient tolerated treatment well;Patient limited by pain Patient left: in bed;with call bell/phone within reach   Time: 1452-1532 OT Time Calculation (min): 40 min Charges:  OT General Charges $OT Visit: 1 Procedure OT Evaluation $Initial OT Evaluation Tier I: 1 Procedure OT Treatments $Self Care/Home Management : 23-37 mins  Keaton Beichner OTR/L 12/07/2014, 3:47 PM

## 2014-12-07 NOTE — Clinical Social Work Note (Signed)
Clinical Social Work Assessment  Patient Details  Name: Frances Gray MRN: [id number] Date of Birth: 1950/06/18  Date of referral:  12/07/14               Reason for consult:  Discharge Planning, Facility Placement                Permission sought to share information with:  Family Supports Permission granted to share information::  Yes, Verbal Permission Granted  Name::     Frances Gray  Agency::  [facility]  Relationship::  husband  Contact Information:  n/a  Housing/Transportation Living arrangements for the past 2 months:  Single Family Home, Hotel/Motel Source of Information:  Patient Patient Interpreter Needed:  None Criminal Activity/Legal Involvement Pertinent to Current Situation/Hospitalization:  No - Comment as needed Significant Relationships:  Spouse Lives with:  Spouse Do you feel safe going back to the place where you live?  No (High fall risk.) Need for family participation in patient care:  No (Coment) (Patient alert and oriented.)  Care giving concerns:  Patient expressed no concerns at this time.   Social Worker assessment / plan:  CSW received referral for possible SNF placement at time of discharge. CSW met with patient and patient's husband to discuss discharge disposition. Per patient, patient will be discharged to [facility] at time of discharge. CSW to continue to follow and assist with discharge planning needs.  Employment status:  Retired Forensic scientist:  Medicare PT Recommendations:  [place] / Referral to community resources:  [place]  Patient/Family's Response to care:  Patient understanding and agreeable to CSW plan of care.  Patient/Family's Understanding of and Emotional Response to Diagnosis, Current Treatment, and Prognosis:  Patient understanding and agreeable to CSW plan of care.  Emotional Assessment Appearance:  Appears stated age Attitude/Demeanor/Rapport:  Other  (Pleasant.) Affect (typically observed):  Accepting, Pleasant, Appropriate Orientation:  Oriented to Self, Oriented to Place, Oriented to  Time, Oriented to Situation Alcohol / Substance use:  Not Applicable Psych involvement (Current and /or in the community):  No (Comment) (Not appropriate on this admission.)  Discharge Needs  Concerns to be addressed:  No discharge needs identified Readmission within the last 30 days:  No Current discharge risk:  None Barriers to Discharge:  No Barriers Identified   Caroline Sauger, LCSW 12/07/2014, 3:08 PM [phone]

## 2014-12-08 DIAGNOSIS — E871 Hypo-osmolality and hyponatremia: Secondary | ICD-10-CM | POA: Diagnosis not present

## 2014-12-08 DIAGNOSIS — Z471 Aftercare following joint replacement surgery: Secondary | ICD-10-CM | POA: Diagnosis not present

## 2014-12-08 DIAGNOSIS — M129 Arthropathy, unspecified: Secondary | ICD-10-CM | POA: Diagnosis not present

## 2014-12-08 DIAGNOSIS — Z96651 Presence of right artificial knee joint: Secondary | ICD-10-CM | POA: Diagnosis not present

## 2014-12-08 DIAGNOSIS — D62 Acute posthemorrhagic anemia: Secondary | ICD-10-CM | POA: Diagnosis not present

## 2014-12-08 DIAGNOSIS — M1711 Unilateral primary osteoarthritis, right knee: Secondary | ICD-10-CM | POA: Diagnosis not present

## 2014-12-08 DIAGNOSIS — M25561 Pain in right knee: Secondary | ICD-10-CM | POA: Diagnosis not present

## 2014-12-08 DIAGNOSIS — D509 Iron deficiency anemia, unspecified: Secondary | ICD-10-CM | POA: Diagnosis not present

## 2014-12-08 DIAGNOSIS — E559 Vitamin D deficiency, unspecified: Secondary | ICD-10-CM | POA: Diagnosis not present

## 2014-12-08 DIAGNOSIS — M6281 Muscle weakness (generalized): Secondary | ICD-10-CM | POA: Diagnosis not present

## 2014-12-08 DIAGNOSIS — R2681 Unsteadiness on feet: Secondary | ICD-10-CM | POA: Diagnosis not present

## 2014-12-08 DIAGNOSIS — I10 Essential (primary) hypertension: Secondary | ICD-10-CM | POA: Diagnosis not present

## 2014-12-08 DIAGNOSIS — R278 Other lack of coordination: Secondary | ICD-10-CM | POA: Diagnosis not present

## 2014-12-08 DIAGNOSIS — J309 Allergic rhinitis, unspecified: Secondary | ICD-10-CM | POA: Diagnosis not present

## 2014-12-08 DIAGNOSIS — M1990 Unspecified osteoarthritis, unspecified site: Secondary | ICD-10-CM | POA: Diagnosis not present

## 2014-12-08 DIAGNOSIS — K5900 Constipation, unspecified: Secondary | ICD-10-CM | POA: Diagnosis not present

## 2014-12-08 DIAGNOSIS — E785 Hyperlipidemia, unspecified: Secondary | ICD-10-CM | POA: Diagnosis not present

## 2014-12-08 DIAGNOSIS — K649 Unspecified hemorrhoids: Secondary | ICD-10-CM | POA: Diagnosis not present

## 2014-12-08 LAB — CBC
HCT: 32.4 % — ABNORMAL LOW (ref 36.0–46.0)
Hemoglobin: 10.9 g/dL — ABNORMAL LOW (ref 12.0–15.0)
MCH: 27.2 pg (ref 26.0–34.0)
MCHC: 33.6 g/dL (ref 30.0–36.0)
MCV: 80.8 fL (ref 78.0–100.0)
Platelets: 307 10*3/uL (ref 150–400)
RBC: 4.01 MIL/uL (ref 3.87–5.11)
RDW: 13.9 % (ref 11.5–15.5)
WBC: 9.3 10*3/uL (ref 4.0–10.5)

## 2014-12-08 NOTE — Clinical Social Work Placement (Signed)
   CLINICAL SOCIAL WORK PLACEMENT  NOTE  Date:  12/08/2014  Patient Details  Name: Frances Gray MRN: [id number] Date of Birth: 18-Dec-1950  Clinical Social Work is seeking post-discharge placement for this patient at the [place] level of care (*CSW will initial, date and re-position this form in  chart as items are completed):  Yes   Patient/family provided with Central Work Department's list of facilities offering this level of care within the geographic area requested by the patient (or if unable, by the patient's family).  Yes   Patient/family informed of their freedom to choose among providers that offer the needed level of care, that participate in Medicare, Medicaid or managed care program needed by the patient, have an available bed and are willing to accept the patient.  Yes   Patient/family informed of [facility]'s ownership interest in [facility] and [facility], as well as of the fact that they are under no obligation to receive care at these facilities.  PASRR submitted to EDS on 12/07/14     PASRR number received on 12/07/14     Existing PASRR number confirmed on  (n/a)     FL2 transmitted to all facilities in geographic area requested by pt/family on 12/07/14     FL2 transmitted to all facilities within larger geographic area on  (n/a)     Patient informed that his/her managed care company has contracts with or will negotiate with certain facilities, including the following:   (yes, [facility])     Yes   Patient/family informed of bed offers received.  [facility]     Physician recommends and patient chooses bed at [facility]    Patient to be transferred to [facility] on 12/08/14.  Patient to be transferred to facility by PTAR     Patient family notified on 12/08/14 of transfer.  Name of family member notified:  Patient and patient's husband updated at bedside.     PHYSICIAN    Additional Comment:    _______________________________________________ Caroline Sauger, LCSW 12/08/2014, 12:39 PM [phone]

## 2014-12-08 NOTE — Discharge Summary (Signed)
Physician Discharge Summary  Patient ID: Frances Gray MRN: [id number] DOB/AGE: July 07, 1950 64 y.o.  Admit date: 12/06/2014 Discharge date: 12/08/2014  Admission Diagnoses:  Primary localized osteoarthritis of right knee  Discharge Diagnoses:  Principal Problem:   Primary localized osteoarthritis of right knee Active Problems:   Severe obesity (BMI >= 40)   Hypertension   Knee osteoarthritis   Past Medical History  Diagnosis Date  . Arthritis     L knee s/p TKR, R DJD knee  . Allergic rhinitis, seasonal     takes Claritin daily as needed and uses Flonase if needed  . Diverticulitis   . Heart murmur     no symptoms  . Anemia     has had anemia in the past. none now  . Vitamin D deficiency disease 08/14/2011    takes Vit D daily  . Osteoarthritis of left knee 07/03/2011  . Hyperlipidemia     was on Pravastatin but hasn't been taking in few months  . Hypertension     takes Hyzaar daily  . History of bronchitis winter of 2014  . Joint pain   . Joint swelling   . GERD (gastroesophageal reflux disease)     doesn't take any meds  . History of colon polyps     benign  . History of kidney stones   . Primary localized osteoarthritis of right knee 12/06/2014    Surgeries: Procedure(s): TOTAL KNEE ARTHROPLASTY on 12/06/2014   Consultants (if any):    Discharged Condition: Improved  Hospital Course: Frances Gray is an 64 y.o. female who was admitted 12/06/2014 with a diagnosis of Primary localized osteoarthritis of right knee and went to the operating room on 12/06/2014 and underwent the above named procedures.    She was given perioperative antibiotics:  Anti-infectives    Start     Dose/Rate Route Frequency Ordered Stop   12/06/14 1500  ceFAZolin (ANCEF) 3 g in dextrose 5 % 50 mL IVPB     3 g 160 mL/hr over 30 Minutes Intravenous Every 6 hours 12/06/14 1346 12/06/14 2209   12/06/14 0630  ceFAZolin (ANCEF) 3 g in dextrose 5 % 50 mL IVPB     3 g 160 mL/hr over 30  Minutes Intravenous To [facility]Stay Surgical 12/05/14 1301 12/06/14 0755    .  She was given sequential compression devices, early ambulation, and xarelto for DVT prophylaxis.  She benefited maximally from the hospital stay and there were no complications.    Recent vital signs:  Filed Vitals:   12/08/14 0604  BP: 145/63  Pulse:   Temp: 98 F (36.7 C)  Resp:     Recent laboratory studies:  Lab Results  Component Value Date   HGB 10.9* 12/08/2014   HGB 10.5* 12/07/2014   HGB 12.8 11/24/2014   Lab Results  Component Value Date   WBC 9.3 12/08/2014   PLT 307 12/08/2014   Lab Results  Component Value Date   INR 1.04 11/10/2012   Lab Results  Component Value Date   NA 133* 12/07/2014   K 3.5 12/07/2014   CL 97* 12/07/2014   CO2 26 12/07/2014   BUN 12 12/07/2014   CREATININE 0.80 12/07/2014   GLUCOSE 118* 12/07/2014    Discharge Medications:     Medication List    TAKE these medications        baclofen 10 MG tablet  Commonly known as:  LIORESAL  Take 1 tablet (10 mg total) by mouth 3 (three) times daily. As  needed for muscle spasm     BIOTIN 5000 5 MG Caps  Generic drug:  Biotin  Take 5 mg by mouth every morning.     calcium carbonate 600 MG Tabs tablet  Commonly known as:  CALCIUM 600  Take 1 tablet (600 mg total) by mouth 2 (two) times daily with a meal.     FLUOCINOLONE ACETONIDE SCALP 0.01 % Oil  Apply 1 mL topically daily as needed (itchy scalp).     fluticasone 50 MCG/ACT nasal spray  Commonly known as:  FLONASE  Place 1 spray into both nostrils daily as needed for allergies or rhinitis.     loratadine 10 MG tablet  Commonly known as:  CLARITIN  Take 10 mg by mouth daily as needed for allergies.     losartan-hydrochlorothiazide 100-25 MG per tablet  Commonly known as:  HYZAAR  Take 1 tablet by mouth daily.     multivitamin tablet  Take 1 tablet by mouth every morning.     ondansetron 4 MG tablet  Commonly known as:  ZOFRAN  Take 1 tablet  (4 mg total) by mouth every 8 (eight) hours as needed for nausea or vomiting.     oxyCODONE-acetaminophen 10-325 MG per tablet  Commonly known as:  PERCOCET  Take 1-2 tablets by mouth every 6 (six) hours as needed for pain. MAXIMUM TOTAL ACETAMINOPHEN DOSE IS 4000 MG PER DAY     pravastatin 20 MG tablet  Commonly known as:  PRAVACHOL  Take 1 tablet (20 mg total) by mouth daily.     rivaroxaban 10 MG Tabs tablet  Commonly known as:  XARELTO  Take 1 tablet (10 mg total) by mouth daily.     sennosides-docusate sodium 8.6-50 MG tablet  Commonly known as:  SENOKOT-S  Take 2 tablets by mouth daily.     Vitamin D3 2000 UNITS Tabs  Take 2,000 Units by mouth every morning.        Diagnostic Studies: Dg Knee Right Port  12/06/2014   CLINICAL DATA:  Post RIGHT total knee replacement  EXAM: PORTABLE RIGHT KNEE - 1-2 VIEW  COMPARISON:  Portable exam 1043 hours without preoperative images for comparison  FINDINGS: Components of RIGHT knee prosthesis identified in expected positions.  No fracture dislocation.  No periprosthetic lucency.  Diffuse osseous demineralization.  Expected postsurgical soft tissue changes.  IMPRESSION: RIGHT knee prosthesis without acute complication.   Electronically Signed   By: Lavonia Dana M.D.   On: 12/06/2014 11:02    Disposition: 06-Home-Health Care Svc        Follow-up Information    Follow up with Frances Bridge, MD. Schedule an appointment as soon as possible for a visit in 2 weeks.   Specialty:  Orthopedic Surgery   Contact information:   [place] [address] [phone]        Signed: Johnny Gray 12/08/2014, 10:54 AM

## 2014-12-08 NOTE — Progress Notes (Signed)
Husband left to meet patient at [facility] and [place] - took her personal belongings. Premedicated for pain @ 1415 - Tylenol 650 mg and Oxy 10 mg PO. D/C via stretcher with 2 attendants per PTAR. Report called to female LPN on [facility].

## 2014-12-08 NOTE — Progress Notes (Signed)
Patient ID: Frances Gray, female   DOB: 1951/01/24, 64 y.o.   MRN: [id number]     Subjective:  Patient reports pain as mild. Patient would like to go to SNF later today after PT  Objective:   VITALS:   Filed Vitals:   12/07/14 0553 12/07/14 1445 12/07/14 2007 12/08/14 0604  BP: 116/60 127/60 155/65 145/63  Pulse: 69 62 74   Temp: 98.1 F (36.7 C) 98.2 F (36.8 C) 99.4 F (37.4 C) 98 F (36.7 C)  TempSrc:  Oral Oral Oral  Resp: 18 18    SpO2: 100% 100% 99% 99%    ABD soft Sensation intact distally Dorsiflexion/Plantar flexion intact Incision: dressing C/D/I and no drainage removed dressing no sign of infection  Lab Results  Component Value Date   WBC 9.3 12/08/2014   HGB 10.9* 12/08/2014   HCT 32.4* 12/08/2014   MCV 80.8 12/08/2014   PLT 307 12/08/2014   BMET    Component Value Date/Time   NA 133* 12/07/2014 0544   K 3.5 12/07/2014 0544   CL 97* 12/07/2014 0544   CO2 26 12/07/2014 0544   GLUCOSE 118* 12/07/2014 0544   BUN 12 12/07/2014 0544   CREATININE 0.80 12/07/2014 0544   CALCIUM 8.6* 12/07/2014 0544   CALCIUM 9.8 11/28/2011 1244   GFRNONAA >60 12/07/2014 0544   GFRAA >60 12/07/2014 0544     Assessment/Plan: 2 Days Post-Op   Principal Problem:   Primary localized osteoarthritis of right knee Active Problems:   Hypertension   Severe obesity (BMI >= 40)   Knee osteoarthritis   Advance diet Up with therapy Discharge to SNF WBAT Dry dressing PRN Follow up with Dr Mardelle Matte in 2 weeks   Remonia Richter 12/08/2014, 6:51 AM  Discussed and agree with above.   Marchia Bond, MD Cell [phone]

## 2014-12-08 NOTE — Progress Notes (Signed)
Physical Therapy Treatment Patient Details Name: Frances Gray MRN: [id number] DOB: 1950-12-13 Today's Date: 12/08/2014    History of Present Illness Patient is a 64 y/o female s/p R TKA. PMH includes HTN, HLD, s/p L TKA 2014.    PT Comments    Patient with increased pain in knee today limiting ambulation and mobility. Swelling and erythema noted as well. Ice applied post tx session. Pt to be d/ced to SNF today. Will continue to follow to maximize independence and mobility.   Follow Up Recommendations  SNF;Supervision/Assistance - 24 hour     Equipment Recommendations  None recommended by PT    Recommendations for Other Services       Precautions / Restrictions Precautions Precautions: Knee;Fall Precaution Booklet Issued: Yes (comment) Precaution Comments: Reviewed no pillow under knee. Required Braces or Orthoses: Knee Immobilizer - Right Knee Immobilizer - Right: Other (comment);On except when in CPM Restrictions Weight Bearing Restrictions: Yes RLE Weight Bearing: Weight bearing as tolerated    Mobility  Bed Mobility Overal bed mobility: Needs Assistance Bed Mobility: Sit to Supine     Supine to sit: Min assist     General bed mobility comments: Min A to bring RLE into bed. Increased time due to pain. HOB flat, no rails to simulate home.  Transfers Overall transfer level: Needs assistance Equipment used: Rolling walker (2 wheeled) Transfers: Sit to/from Stand Sit to Stand: Min assist         General transfer comment: Min A to boost to standing from chair. Cues for technique.   Ambulation/Gait Ambulation/Gait assistance: Min guard Ambulation Distance (Feet): 20 Feet Assistive device: Rolling walker (2 wheeled) Gait Pattern/deviations: Step-to pattern;Decreased stance time - right;Decreased step length - left;Trunk flexed;Antalgic   Gait velocity interpretation: Below normal speed for age/gender General Gait Details: increased WB through [place] today  secondary to pain and need to offload RLE.   Stairs            Wheelchair Mobility    Modified Rankin (Stroke Patients Only)       Balance Overall balance assessment: Needs assistance Sitting-balance support: Feet supported;No upper extremity supported Sitting balance-Leahy Scale: Good     Standing balance support: During functional activity Standing balance-Leahy Scale: Poor                      Cognition Arousal/Alertness: Awake/alert Behavior During Therapy: WFL for tasks assessed/performed Overall Cognitive Status: Within Functional Limits for tasks assessed                      Exercises Total Joint Exercises Ankle Circles/Pumps: Both;15 reps;Seated Quad Sets: Right;10 reps;Supine Goniometric ROM: 10-60 degrees knee AROM    General Comments General comments (skin integrity, edema, etc.): Spouse present.       Pertinent Vitals/Pain Pain Assessment: 0-10 Pain Score: 8  Pain Location: right knee Pain Descriptors / Indicators: Moaning;Sore;Aching;Grimacing;Guarding;Throbbing Pain Intervention(s): Limited activity within patient's tolerance;RN gave pain meds during session;Monitored during session;Repositioned;Ice applied    Home Living                      Prior Function            PT Goals (current goals can now be found in the care plan section) Progress towards PT goals: Not progressing toward goals - comment (limited by pain today)    Frequency  7X/week    PT Plan Current plan remains appropriate    Co-evaluation  End of Session Equipment Utilized During Treatment: Gait belt Activity Tolerance: Patient limited by pain Patient left: in bed;with call bell/phone within reach;with family/visitor present;with nursing/sitter in room     Time: 1028-1050 PT Time Calculation (min) (ACUTE ONLY): 22 min  Charges:  $Therapeutic Activity: 8-22 mins                    G Codes:      Marlette Curvin A  Maudy Yonan 12/08/2014, 11:37 AM Wray Kearns, PT, DPT [phone]

## 2014-12-08 NOTE — Care Management (Signed)
Important Message  Patient Details  Name: Frances Gray MRN: [id number] Date of Birth: Dec 22, 1950   Medicare Important Message Given:  Yes-second notification given    Delorse Lek 12/08/2014, 10:32 AM

## 2014-12-08 NOTE — Discharge Planning (Signed)
Patient to be discharged to [facility]. Patient and patient's husband updated at bedside.  Facility: [facility] RN report number: [phone] Transportation: EMS ([address])  [place] Say[place], [place] [phone]) and Surgical [phone])

## 2014-12-08 NOTE — Progress Notes (Signed)
Occupational Therapy Treatment Patient Details Name: Frances Gray MRN: [id number] DOB: [birth year] Today's Date: 12/08/2014    History of present illness Patient is a 64 y/o female s/p R TKA. PMH includes HTN, HLD, s/p L TKA 2014.   OT comments  Pt still requiring assistance for advancing the LE to the EOB for sitting as well as for transitional movements sit to stand.  Recommend continued OT at SNF at discharge.    Follow Up Recommendations  SNF;Supervision/Assistance - 24 hour          Precautions / Restrictions Precautions Precautions: Knee;Fall Required Braces or Orthoses: Knee Immobilizer - Right Restrictions Weight Bearing Restrictions: No RLE Weight Bearing: Weight bearing as tolerated       Mobility Bed Mobility Overal bed mobility: Needs Assistance       Supine to sit: Min assist     General bed mobility comments: Min A to bring RLE into bed. Increased time due to pain. HOB flat, no rails to simulate home.  Transfers Overall transfer level: Needs assistance Equipment used: Rolling walker (2 wheeled)   Sit to Stand: Min assist Stand pivot transfers: Min assist            Balance Overall balance assessment: Needs assistance   Sitting balance-Leahy Scale: Good       Standing balance-Leahy Scale: Poor                     ADL Overall ADL's : At baseline                         Toilet Transfer: Minimal assistance;BSC;Ambulation;RW Toilet Transfer Details (indicate cue type and reason): simulated           General ADL Comments: Pt min assist for supine to sit EOB in preparation for transfer to stretcher for transition to SNF.   Pt still needing min to mod assist for sit to stand from lowered EOB.  Max assist for donning KI sitting EOB, prior to standing.                                   Pertinent Vitals/ Pain       Pain Assessment: 0-10 Pain Score: 7  Pain Location: right knee Pain Descriptors / Indicators:  Aching Pain Intervention(s): Repositioned            Progress Toward Goals  OT Goals(current goals can now be found in the care plan section)  Progress towards OT goals: Progressing toward goals     Plan Discharge plan remains appropriate       End of Session Equipment Utilized During Treatment: Gait belt;Rolling walker;Right knee immobilizer   Activity Tolerance Patient tolerated treatment well (Limited session secondary to pt discharging to SNF and transportation present)   Patient Left Other (comment) (on stretcher)           Time: 1540-1550 OT Time Calculation (min): 10 min  Charges: OT General Charges $OT Visit: 1 Procedure OT Treatments $Self Care/Home Management : 8-22 mins  Christi Wirick OTR/L 12/08/2014, 4:43 PM

## 2014-12-09 ENCOUNTER — Telehealth: Payer: Self-pay

## 2014-12-09 ENCOUNTER — Encounter: Payer: Self-pay | Admitting: Adult Health

## 2014-12-09 ENCOUNTER — Non-Acute Institutional Stay (SKILLED_NURSING_FACILITY): Payer: Medicare Other | Admitting: Adult Health

## 2014-12-09 DIAGNOSIS — I10 Essential (primary) hypertension: Secondary | ICD-10-CM | POA: Diagnosis not present

## 2014-12-09 DIAGNOSIS — M1711 Unilateral primary osteoarthritis, right knee: Secondary | ICD-10-CM

## 2014-12-09 DIAGNOSIS — J309 Allergic rhinitis, unspecified: Secondary | ICD-10-CM | POA: Diagnosis not present

## 2014-12-09 DIAGNOSIS — E559 Vitamin D deficiency, unspecified: Secondary | ICD-10-CM | POA: Diagnosis not present

## 2014-12-09 DIAGNOSIS — K5900 Constipation, unspecified: Secondary | ICD-10-CM | POA: Diagnosis not present

## 2014-12-09 DIAGNOSIS — E785 Hyperlipidemia, unspecified: Secondary | ICD-10-CM

## 2014-12-09 DIAGNOSIS — D62 Acute posthemorrhagic anemia: Secondary | ICD-10-CM

## 2014-12-09 NOTE — Telephone Encounter (Signed)
Pt is on TCM, knee arthroplasty to follow up with ortho.

## 2014-12-11 NOTE — Progress Notes (Signed)
Patient ID: Frances Gray, female   DOB: 1951/03/19, 64 y.o.   MRN: [id number]   12/09/14  Facility:  Nursing Home Location:  [place] Room Number: 1206-P LEVEL OF CARE:  SNF (31)   Chief Complaint  Patient presents with  . Hospitalization Follow-up    Osteoarthritis S/P right total knee arthroplasty, hypertension, hyperlipidemia, allergic rhinitis and anemia    HISTORY OF PRESENT ILLNESS:  This is a 64 year old female who has been admitted to [facility] on 11/21/14 from [facility] with osteoarthritis S/P right total knee arthroplasty. He has PMH of arthritis (S/P left total knee arthroplasty), allergic rhinitis, diverticulitis, heart murmur, anemia, vitamin D deficiency, hyperlipidemia, history of bronchitis and history of kidney stones.  She has been admitted for a short-term rehabilitation.  PAST MEDICAL HISTORY:  Past Medical History  Diagnosis Date  . Arthritis     L knee s/p TKR, R DJD knee  . Allergic rhinitis, seasonal     takes Claritin daily as needed and uses Flonase if needed  . Diverticulitis   . Heart murmur     no symptoms  . Anemia     has had anemia in the past. none now  . Vitamin D deficiency disease 08/14/2011    takes Vit D daily  . Osteoarthritis of left knee 07/03/2011  . Hyperlipidemia     was on Pravastatin but hasn't been taking in few months  . Hypertension     takes Hyzaar daily  . History of bronchitis winter of 2014  . Joint pain   . Joint swelling   . GERD (gastroesophageal reflux disease)     doesn't take any meds  . History of colon polyps     benign  . History of kidney stones   . Primary localized osteoarthritis of right knee 12/06/2014    CURRENT MEDICATIONS: Reviewed per MAR/see medication list  Allergies  Allergen Reactions  . Amlodipine Cough  . Pravastatin     Joint ache     REVIEW OF SYSTEMS:  GENERAL: no change in appetite, no fatigue, no weight changes, no fever, chills or  weakness RESPIRATORY: no cough, SOB, DOE, wheezing, hemoptysis CARDIAC: no chest pain, edema or palpitations GI: no abdominal pain, diarrhea, heart burn, nausea or vomiting, +constipation  PHYSICAL EXAMINATION  GENERAL: no acute distress, severely obese SKIN:  Right knee surgical incision has steri-trips, noted  Incision has clear drainage EYES: conjunctivae normal, sclerae normal, normal eye lids NECK: supple, trachea midline, no neck masses, no thyroid tenderness, no thyromegaly LYMPHATICS: no LAN in the neck, no supraclavicular LAN RESPIRATORY: breathing is even & unlabored, BS CTAB CARDIAC: RRR, no murmur,no extra heart sounds, no edema GI: abdomen soft, normal BS, no masses, no tenderness, no hepatomegaly, no splenomegaly EXTREMITIES:  Able to move 4 extremities PSYCHIATRIC: the patient is alert & oriented to person, affect & behavior appropriate  LABS/RADIOLOGY: Labs reviewed: Basic Metabolic Panel:  Recent Labs  05/12/14 1120 11/24/14 0950 12/07/14 0544  NA 140 139 133*  K 3.9 3.8 3.5  CL 106 105 97*  CO2 25 28 26   GLUCOSE 95 113* 118*  BUN 13 14 12   CREATININE 0.7 0.70 0.80  CALCIUM 9.2 9.5 8.6*   Liver Function Tests:  Recent Labs  05/12/14 1120  AST 22  ALT 19  ALKPHOS 68  BILITOT 0.6  PROT 7.8  ALBUMIN 3.8   CBC:  Recent Labs  05/12/14 1120 11/24/14 0950 12/07/14 0544 12/08/14 0404  WBC 5.9 5.1 9.0 9.3  NEUTROABS 3.2  --   --   --   HGB 13.1 12.8 10.5* 10.9*  HCT 39.9 39.4 32.2* 32.4*  MCV 82.5 82.4 81.5 80.8  PLT 332.0 346 302 307   Lipid Panel:  Recent Labs  05/12/14 1120  HDL 48.80   Dg Knee Right Port  12/06/2014   CLINICAL DATA:  Post RIGHT total knee replacement  EXAM: PORTABLE RIGHT KNEE - 1-2 VIEW  COMPARISON:  Portable exam 1043 hours without preoperative images for comparison  FINDINGS: Components of RIGHT knee prosthesis identified in expected positions.  No fracture dislocation.  No periprosthetic lucency.  Diffuse osseous  demineralization.  Expected postsurgical soft tissue changes.  IMPRESSION: RIGHT knee prosthesis without acute complication.   Electronically Signed   By: Lavonia Dana M.D.   On: 12/06/2014 11:02    ASSESSMENT/PLAN:  Osteoarthritis S/P right total knee arthroplasty - for rehabilitation; follow-up with Dr. Marchia Bond, orthopedic surgeon, in 2 weeks; continue Xarelto 10 mg 1 tab by mouth daily for DVT prophylaxis; baclofen 10 mg 1 tab by mouth 3 times a day when necessary for muscle spasm; and Percocet 10/325 mg 1-2 tabs by mouth every 6 hours when necessary for pain  Vitamin D deficiency - continue vitamin D3 2000 units 1 tab by mouth every morning  Hypertension - well controlled; continue Hyzaar 100-25 milligrams 1 tab by mouth daily  Hyperlipidemia - continue Pravachol 20 mg 1 tab by mouth daily  Allergic rhinitis - continue Flonase 50 g/act 1 spray daily when necessary and Claritin 10 mg 1 tab by mouth daily when necessary  Anemia, acute blood loss - hemoglobin 10.9; will monitor  Constipation - start senna S2 tabs by mouth twice a day and MiraLAX 17 g +46 ounces liquid by mouth twice a day 4 days then daily   Goals of care:  Short-term rehabilitation   Labs/test ordered:  CBC and BMP  Spent 50 minutes in patient care.   [facility], NP [employer] [phone]

## 2014-12-13 ENCOUNTER — Other Ambulatory Visit: Payer: Self-pay

## 2014-12-13 MED ORDER — OXYCODONE-ACETAMINOPHEN 10-325 MG PO TABS: 1.0000 | ORAL_TABLET | Freq: Four times a day (QID) | ORAL | Status: DC | PRN

## 2014-12-13 NOTE — Telephone Encounter (Signed)
RX Fax to [facility] @ [phone]  

## 2014-12-14 ENCOUNTER — Non-Acute Institutional Stay (SKILLED_NURSING_FACILITY): Payer: Medicare Other | Admitting: Internal Medicine

## 2014-12-14 DIAGNOSIS — I10 Essential (primary) hypertension: Secondary | ICD-10-CM | POA: Diagnosis not present

## 2014-12-14 DIAGNOSIS — K5900 Constipation, unspecified: Secondary | ICD-10-CM | POA: Diagnosis not present

## 2014-12-14 DIAGNOSIS — E785 Hyperlipidemia, unspecified: Secondary | ICD-10-CM | POA: Diagnosis not present

## 2014-12-14 DIAGNOSIS — K649 Unspecified hemorrhoids: Secondary | ICD-10-CM | POA: Diagnosis not present

## 2014-12-14 DIAGNOSIS — M129 Arthropathy, unspecified: Secondary | ICD-10-CM | POA: Diagnosis not present

## 2014-12-14 DIAGNOSIS — E871 Hypo-osmolality and hyponatremia: Secondary | ICD-10-CM | POA: Diagnosis not present

## 2014-12-14 DIAGNOSIS — D509 Iron deficiency anemia, unspecified: Secondary | ICD-10-CM

## 2014-12-14 DIAGNOSIS — M1711 Unilateral primary osteoarthritis, right knee: Secondary | ICD-10-CM

## 2014-12-14 NOTE — Progress Notes (Signed)
Patient ID: Frances Gray, female   DOB: 1951/02/12, 64 y.o.   MRN: [id number]     [facility]   PCP: Gwendolyn Grant, MD  Code Status: full code  Allergies  Allergen Reactions  . Amlodipine Cough  . Pravastatin     Joint ache    Chief Complaint  Patient presents with  . New Admit To SNF     HPI:  64 year old patient is here for short term rehabilitation post hospital admission from 12/06/14-12/08/14 with right knee OA. She underwent right total knee arthroplasty. She is seen in her room today. Her pain is under control with current regimen. She has been straining with her bowel movement and has noticed fresh blood in her stool for last 2 days. She complaints of soreness in her bottom. Denies any blood in her urine. No other complaints.   Review of Systems:  Constitutional: Negative for fever, chills, diaphoresis.  HENT: Negative for headache, congestion, nasal discharge Eyes: Negative for eye pain, blurred vision, double vision and discharge.  Respiratory: Negative for cough, shortness of breath and wheezing.   Cardiovascular: Negative for chest pain, palpitations, leg swelling.  Gastrointestinal: Negative for heartburn, nausea, vomiting, abdominal pain, diarrhea. appetite is good Genitourinary: Negative for dysuria,hematuria, flank pain.  Musculoskeletal: Negative for back pain, falls. Skin: Negative for itching, rash.  Neurological: Negative for dizziness, tingling, focal weakness Psychiatric/Behavioral: Negative for depression.    Past Medical History  Diagnosis Date  . Arthritis     L knee s/p TKR, R DJD knee  . Allergic rhinitis, seasonal     takes Claritin daily as needed and uses Flonase if needed  . Diverticulitis   . Heart murmur     no symptoms  . Anemia     has had anemia in the past. none now  . Vitamin D deficiency disease 08/14/2011    takes Vit D daily  . Osteoarthritis of left knee 07/03/2011  . Hyperlipidemia    was on Pravastatin but hasn't been taking in few months  . Hypertension     takes Hyzaar daily  . History of bronchitis winter of 2014  . Joint pain   . Joint swelling   . GERD (gastroesophageal reflux disease)     doesn't take any meds  . History of colon polyps     benign  . History of kidney stones   . Primary localized osteoarthritis of right knee 12/06/2014   Past Surgical History  Procedure Laterality Date  . Knee arthroscopy      bilateral  . Lithotripsy  08/2011    R ureteral stone  . Total knee arthroplasty Left 11/16/2012    Procedure: TOTAL KNEE ARTHROPLASTY- left;  Surgeon: Johnny Bridge, MD;  Location: [place];  Service: Orthopedics;  Laterality: Left;  . Breast ultrasound Right 03/02/13    The 41mm complex cyst in the right breast is probably benign. Follow-up in 6 months with bilateral mammogram and (R) ultrasound  . Breast ultrasound Right 09/02/13    There is a benign 3 mm complex cyst in the (R) breast at 9 o'clock  . Esophagus surgery  1996    tumor removed   (benign)  . Breast biopsy Left   . Colonoscopy    . Total knee arthroplasty Right 12/06/2014    Procedure: TOTAL KNEE ARTHROPLASTY;  Surgeon: Marchia Bond, MD;  Location: [place];  Service: Orthopedics;  Laterality: Right;   Social History:   reports that she  has quit smoking. She has never used smokeless tobacco. She reports that she does not drink alcohol or use illicit drugs.  Family History  Problem Relation Age of Onset  . Ovarian cancer Mother   . Colon cancer Sister 35  . Breast cancer Sister   . Esophageal cancer Neg Hx     Medications: Patient's Medications  New Prescriptions   No medications on file  Previous Medications   BACLOFEN (LIORESAL) 10 MG TABLET    Take 1 tablet (10 mg total) by mouth 3 (three) times daily. As needed for muscle spasm   BIOTIN (BIOTIN 5000) 5 MG CAPS    Take 5 mg by mouth every morning.    CALCIUM CARBONATE (CALCIUM 600) 600 MG TABS TABLET    Take 1 tablet (600 mg  total) by mouth 2 (two) times daily with a meal.   CHOLECALCIFEROL (VITAMIN D3) 2000 UNITS TABS    Take 2,000 Units by mouth every morning.    FLUOCINOLONE ACETONIDE SCALP 0.01 % OIL    Apply 1 mL topically daily as needed (itchy scalp).    FLUTICASONE (FLONASE) 50 MCG/ACT NASAL SPRAY    Place 1 spray into both nostrils daily as needed for allergies or rhinitis.    LORATADINE (CLARITIN) 10 MG TABLET    Take 10 mg by mouth daily as needed for allergies.   LOSARTAN-HYDROCHLOROTHIAZIDE (HYZAAR) 100-25 MG PER TABLET    Take 1 tablet by mouth daily.   MULTIPLE VITAMIN (MULTIVITAMIN) TABLET    Take 1 tablet by mouth every morning.    ONDANSETRON (ZOFRAN) 4 MG TABLET    Take 1 tablet (4 mg total) by mouth every 8 (eight) hours as needed for nausea or vomiting.   OXYCODONE-ACETAMINOPHEN (PERCOCET) 10-325 MG PER TABLET    Take 1-2 tablets by mouth every 6 (six) hours as needed for pain. MAXIMUM TOTAL ACETAMINOPHEN DOSE IS 4000 MG PER DAY   PRAVASTATIN (PRAVACHOL) 20 MG TABLET    Take 1 tablet (20 mg total) by mouth daily.   RIVAROXABAN (XARELTO) 10 MG TABS TABLET    Take 1 tablet (10 mg total) by mouth daily.   SENNOSIDES-DOCUSATE SODIUM (SENOKOT-S) 8.6-50 MG TABLET    Take 2 tablets by mouth daily.  Modified Medications   No medications on file  Discontinued Medications   No medications on file     Physical Exam: Filed Vitals:   12/14/14 1017  BP: 134/60  Pulse: 71  Temp: 98.4 F (36.9 C)  Resp: 18  SpO2: 97%    General- elderly female, obese, in no acute distress Head- normocephalic, atraumatic Throat- moist mucus membrane Eyes- PERRLA, EOMI, no pallor, no icterus, no discharge, normal conjunctiva, normal sclera Neck- no cervical lymphadenopathy Cardiovascular- normal s1,s2, no murmurs, palpable dorsalis pedis and radial pulses, 1+ right leg edema Respiratory- bilateral clear to auscultation, no wheeze, no rhonchi, no crackles, no use of accessory muscles Abdomen- bowel sounds present,  soft, non tender, external hemorrhoid noted, no active bleed, guaiac stool positive Musculoskeletal- able to move all 4 extremities, right knee ROM limited, on wheelchair Neurological- no focal deficit Skin- warm and dry, right knee surgical incision with steristrips and healing well Psychiatry- alert and oriented to person, place and time, normal mood and affect    Labs reviewed: Basic Metabolic Panel:  Recent Labs  05/12/14 1120 11/24/14 0950 12/07/14 0544  NA 140 139 133*  K 3.9 3.8 3.5  CL 106 105 97*  CO2 25 28 26   GLUCOSE 95 113*  118*  BUN 13 14 12   CREATININE 0.7 0.70 0.80  CALCIUM 9.2 9.5 8.6*   Liver Function Tests:  Recent Labs  05/12/14 1120  AST 22  ALT 19  ALKPHOS 68  BILITOT 0.6  PROT 7.8  ALBUMIN 3.8   No results for input(s): LIPASE, AMYLASE in the last 8760 hours. No results for input(s): AMMONIA in the last 8760 hours. CBC:  Recent Labs  05/12/14 1120 11/24/14 0950 12/07/14 0544 12/08/14 0404  WBC 5.9 5.1 9.0 9.3  NEUTROABS 3.2  --   --   --   HGB 13.1 12.8 10.5* 10.9*  HCT 39.9 39.4 32.2* 32.4*  MCV 82.5 82.4 81.5 80.8  PLT 332.0 346 302 307   12/13/14 wbc 6.7, hb 10.3, hct 29.8, plt 362, mcv 79.5, na 137, k 3.2, glu 105, bun 12, cr 0.68, ca 8.9  Radiological Exams:  Dg Knee Right Port  12/06/2014   CLINICAL DATA:  Post RIGHT total knee replacement  EXAM: PORTABLE RIGHT KNEE - 1-2 VIEW  COMPARISON:  Portable exam 1043 hours without preoperative images for comparison  FINDINGS: Components of RIGHT knee prosthesis identified in expected positions.  No fracture dislocation.  No periprosthetic lucency.  Diffuse osseous demineralization.  Expected postsurgical soft tissue changes.  IMPRESSION: RIGHT knee prosthesis without acute complication.      Assessment/Plan  Right knee Osteoarthritis  S/P right total knee arthroplasty. Will have her work with physical therapy and occupational therapy team to help with gait training and muscle  strengthening exercises.fall precautions. Skin care. Encourage to be out of bed. Continue percocet 10-325 1-2 tab q4h prn pain and baclofen 10 mg tid prn muscle spasm. Continue Xarelto 10 mg daily for DVT prophylaxis. Has follow up with orthopedics. Continue vitamin d supplement  Constipation On senna s bid and miralax daily, hydration encouraged. Monitor clinically  Acute blood loss anemia/ microcytic anemia Post op, monitor h&h, hb 12/13/14 10.3 from 10.9 at discharge. mcv 79.5. On xarelto and guaiac positive stool. Start ferrous sulfate 325 mg bid for now. Check cbc in 2 days to rule out active bleed  Hemorrhoids Has external hemorrhoid, possible internal hemorrhoids as well. On stool softener. Add hydrocortisone suppositories with rectal pain bid x 2 weeks and reassess  Hypertension Stable readings, continue Hyzaar 100-25 mg daily and monitor bp  Hyperlipidemia continue Pravachol 20 mg daily  Hyponatremia Resolved, recheck na 137, monitor clinically   Goals of care: short term rehabilitation   Labs/tests ordered: cbc  Family/ staff Communication: reviewed care plan with patient and nursing supervisor    Blanchie Serve, MD  [facility] [phone] (Monday-Friday 8 am - 5 pm) [phone] (afterhours)

## 2014-12-15 ENCOUNTER — Non-Acute Institutional Stay (SKILLED_NURSING_FACILITY): Payer: Medicare Other | Admitting: Adult Health

## 2014-12-15 ENCOUNTER — Encounter: Payer: Self-pay | Admitting: Adult Health

## 2014-12-15 DIAGNOSIS — D62 Acute posthemorrhagic anemia: Secondary | ICD-10-CM

## 2014-12-15 DIAGNOSIS — M1711 Unilateral primary osteoarthritis, right knee: Secondary | ICD-10-CM

## 2014-12-15 DIAGNOSIS — I10 Essential (primary) hypertension: Secondary | ICD-10-CM

## 2014-12-15 DIAGNOSIS — J309 Allergic rhinitis, unspecified: Secondary | ICD-10-CM | POA: Diagnosis not present

## 2014-12-15 DIAGNOSIS — E785 Hyperlipidemia, unspecified: Secondary | ICD-10-CM

## 2014-12-15 DIAGNOSIS — K5900 Constipation, unspecified: Secondary | ICD-10-CM

## 2014-12-15 DIAGNOSIS — E559 Vitamin D deficiency, unspecified: Secondary | ICD-10-CM

## 2014-12-15 NOTE — Progress Notes (Signed)
Patient ID: Frances Gray, female   DOB: [birth year], 64 y.o.   MRN: [id number]   12/15/14  Facility:  Nursing Home Location:  [place] Room Number: 1206-P LEVEL OF CARE:  SNF (31)   Chief Complaint  Patient presents with  . Discharge Note    Osteoarthritis S/P right total knee arthroplasty, hypertension, hyperlipidemia, allergic rhinitis and anemia    HISTORY OF PRESENT ILLNESS:  This is a 64 year old female who is for discharge home and plans to have outpatient rehabilitation. She has been admitted to [facility] on 11/21/14 from [facility] with osteoarthritis S/P right total knee arthroplasty. She has PMH of arthritis (S/P left total knee arthroplasty), allergic rhinitis, diverticulitis, heart murmur, anemia, vitamin D deficiency, hyperlipidemia, history of bronchitis and history of kidney stones.  She complained of tenderness on her right ankle. RLE has edema 2+, no bruising and skin is warm to touch. Bedside doppler ultrasound is + for pulse. No erythema noted on right knee. Surgical wound is dry.  Patient was admitted to this facility for short-term rehabilitation after the patient's recent hospitalization.  Patient has completed SNF rehabilitation and therapy has cleared the patient for discharge.  PAST MEDICAL HISTORY:  Past Medical History  Diagnosis Date  . Arthritis     L knee s/p TKR, R DJD knee  . Allergic rhinitis, seasonal     takes Claritin daily as needed and uses Flonase if needed  . Diverticulitis   . Heart murmur     no symptoms  . Anemia     has had anemia in the past. none now  . Vitamin D deficiency disease 08/14/2011    takes Vit D daily  . Osteoarthritis of left knee 07/03/2011  . Hyperlipidemia     was on Pravastatin but hasn't been taking in few months  . Hypertension     takes Hyzaar daily  . History of bronchitis winter of 2014  . Joint pain   . Joint swelling   . GERD (gastroesophageal reflux disease)    doesn't take any meds  . History of colon polyps     benign  . History of kidney stones   . Primary localized osteoarthritis of right knee 12/06/2014    CURRENT MEDICATIONS: Reviewed per MAR/see medication list  Allergies  Allergen Reactions  . Amlodipine Cough  . Pravastatin     Joint ache     REVIEW OF SYSTEMS:  GENERAL: no change in appetite, no fatigue, no weight changes, no fever, chills or weakness RESPIRATORY: no cough, SOB, DOE, wheezing, hemoptysis CARDIAC: no chest pain, or palpitations GI: no abdominal pain, diarrhea, heart burn, nausea or vomiting  PHYSICAL EXAMINATION  GENERAL: no acute distress, severely obese SKIN:  Right knee surgical incision has steri-trips, dry, no redness NECK: supple, trachea midline, no neck masses, no thyroid tenderness, no thyromegaly LYMPHATICS: no LAN in the neck, no supraclavicular LAN RESPIRATORY: breathing is even & unlabored, BS CTAB CARDIAC: RRR, no murmur,no extra heart sounds, RLE edema 2+ GI: abdomen soft, normal BS, no masses, no tenderness, no hepatomegaly, no splenomegaly EXTREMITIES:  Able to move 4 extremities PSYCHIATRIC: the patient is alert & oriented to person, affect & behavior appropriate  LABS/RADIOLOGY: Labs reviewed: 12/13/14  WBC 6.7 hemoglobin 10.3 hematocrit 29.8 MCV 79.5 platelet 362 sodium 137 potassium 4.2 glucose 105 BUN 12 creatinine 0.68 calcium 8.9 Basic Metabolic Panel:  Recent Labs  05/12/14 1120 11/24/14 0950 12/07/14 0544  NA 140 139 133*  K 3.9 3.8 3.5  CL 106 105 97*  CO2 25 28 26   GLUCOSE 95 113* 118*  BUN 13 14 12   CREATININE 0.7 0.70 0.80  CALCIUM 9.2 9.5 8.6*   Liver Function Tests:  Recent Labs  05/12/14 1120  AST 22  ALT 19  ALKPHOS 68  BILITOT 0.6  PROT 7.8  ALBUMIN 3.8   CBC:  Recent Labs  05/12/14 1120 11/24/14 0950 12/07/14 0544 12/08/14 0404  WBC 5.9 5.1 9.0 9.3  NEUTROABS 3.2  --   --   --   HGB 13.1 12.8 10.5* 10.9*  HCT 39.9 39.4 32.2* 32.4*  MCV  82.5 82.4 81.5 80.8  PLT 332.0 346 302 307   Lipid Panel:  Recent Labs  05/12/14 1120  HDL 48.80   Dg Knee Right Port  12/06/2014   CLINICAL DATA:  Post RIGHT total knee replacement  EXAM: PORTABLE RIGHT KNEE - 1-2 VIEW  COMPARISON:  Portable exam 1043 hours without preoperative images for comparison  FINDINGS: Components of RIGHT knee prosthesis identified in expected positions.  No fracture dislocation.  No periprosthetic lucency.  Diffuse osseous demineralization.  Expected postsurgical soft tissue changes.  IMPRESSION: RIGHT knee prosthesis without acute complication.   Electronically Signed   By: Lavonia Dana M.D.   On: 12/06/2014 11:02    ASSESSMENT/PLAN:  Osteoarthritis S/P right total knee arthroplasty - for outpatient rehabilitation; follow-up with Dr. Marchia Bond, orthopedic surgeon; continue Xarelto 10 mg 1 tab by mouth daily for DVT prophylaxis; baclofen 10 mg 1 tab by mouth 3 times a day when necessary for muscle spasm; and Percocet 10/325 mg 1-2 tabs by mouth every 6 hours when necessary for pain  Vitamin D deficiency - continue vitamin D3 2000 units 1 tab by mouth every morning  Hypertension - well controlled; continue Hyzaar 100-25 mg 1 tab by mouth daily  Hyperlipidemia - continue Pravachol 20 mg 1 tab by mouth daily  Allergic rhinitis - continue Flonase 50 g/act 1 spray daily when necessary and Claritin 10 mg 1 tab by mouth daily when necessary  Anemia, acute blood loss - hemoglobin 10.3; recently started on FeSO4 325 mg 1 tab PO BID  Constipation - continue senna S2 tabs by mouth twice a day and MiraLAX 17 g +46 ounces liquid by mouth daily    I have filled out patient's discharge paperwork and written prescriptions.  Patient will have outpatient rehabilitation.  Total discharge time: Less than 30 minutes  Discharge time involved coordination of the discharge process with Education officer, museum, nursing staff and therapy department. Medical justification for home  health services verified.    [facility], NP [employer] [phone]

## 2014-12-19 DIAGNOSIS — M1711 Unilateral primary osteoarthritis, right knee: Secondary | ICD-10-CM | POA: Diagnosis not present

## 2014-12-20 DIAGNOSIS — Z96651 Presence of right artificial knee joint: Secondary | ICD-10-CM | POA: Diagnosis not present

## 2014-12-20 DIAGNOSIS — M25661 Stiffness of right knee, not elsewhere classified: Secondary | ICD-10-CM | POA: Diagnosis not present

## 2014-12-20 DIAGNOSIS — R262 Difficulty in walking, not elsewhere classified: Secondary | ICD-10-CM | POA: Diagnosis not present

## 2014-12-20 DIAGNOSIS — M25561 Pain in right knee: Secondary | ICD-10-CM | POA: Diagnosis not present

## 2014-12-22 DIAGNOSIS — Z96651 Presence of right artificial knee joint: Secondary | ICD-10-CM | POA: Diagnosis not present

## 2014-12-22 DIAGNOSIS — R262 Difficulty in walking, not elsewhere classified: Secondary | ICD-10-CM | POA: Diagnosis not present

## 2014-12-22 DIAGNOSIS — M25561 Pain in right knee: Secondary | ICD-10-CM | POA: Diagnosis not present

## 2014-12-22 DIAGNOSIS — M25661 Stiffness of right knee, not elsewhere classified: Secondary | ICD-10-CM | POA: Diagnosis not present

## 2014-12-26 DIAGNOSIS — M25561 Pain in right knee: Secondary | ICD-10-CM | POA: Diagnosis not present

## 2014-12-26 DIAGNOSIS — M25661 Stiffness of right knee, not elsewhere classified: Secondary | ICD-10-CM | POA: Diagnosis not present

## 2014-12-26 DIAGNOSIS — R262 Difficulty in walking, not elsewhere classified: Secondary | ICD-10-CM | POA: Diagnosis not present

## 2014-12-26 DIAGNOSIS — Z96651 Presence of right artificial knee joint: Secondary | ICD-10-CM | POA: Diagnosis not present

## 2014-12-27 DIAGNOSIS — M25661 Stiffness of right knee, not elsewhere classified: Secondary | ICD-10-CM | POA: Diagnosis not present

## 2014-12-27 DIAGNOSIS — M25561 Pain in right knee: Secondary | ICD-10-CM | POA: Diagnosis not present

## 2014-12-27 DIAGNOSIS — R262 Difficulty in walking, not elsewhere classified: Secondary | ICD-10-CM | POA: Diagnosis not present

## 2014-12-27 DIAGNOSIS — Z96651 Presence of right artificial knee joint: Secondary | ICD-10-CM | POA: Diagnosis not present

## 2014-12-29 DIAGNOSIS — R262 Difficulty in walking, not elsewhere classified: Secondary | ICD-10-CM | POA: Diagnosis not present

## 2014-12-29 DIAGNOSIS — M25561 Pain in right knee: Secondary | ICD-10-CM | POA: Diagnosis not present

## 2014-12-29 DIAGNOSIS — M25661 Stiffness of right knee, not elsewhere classified: Secondary | ICD-10-CM | POA: Diagnosis not present

## 2014-12-29 DIAGNOSIS — Z96651 Presence of right artificial knee joint: Secondary | ICD-10-CM | POA: Diagnosis not present

## 2015-01-02 DIAGNOSIS — Z96651 Presence of right artificial knee joint: Secondary | ICD-10-CM | POA: Diagnosis not present

## 2015-01-02 DIAGNOSIS — R262 Difficulty in walking, not elsewhere classified: Secondary | ICD-10-CM | POA: Diagnosis not present

## 2015-01-02 DIAGNOSIS — M25561 Pain in right knee: Secondary | ICD-10-CM | POA: Diagnosis not present

## 2015-01-02 DIAGNOSIS — M25661 Stiffness of right knee, not elsewhere classified: Secondary | ICD-10-CM | POA: Diagnosis not present

## 2015-01-04 DIAGNOSIS — M25661 Stiffness of right knee, not elsewhere classified: Secondary | ICD-10-CM | POA: Diagnosis not present

## 2015-01-04 DIAGNOSIS — M25561 Pain in right knee: Secondary | ICD-10-CM | POA: Diagnosis not present

## 2015-01-04 DIAGNOSIS — R262 Difficulty in walking, not elsewhere classified: Secondary | ICD-10-CM | POA: Diagnosis not present

## 2015-01-04 DIAGNOSIS — Z96651 Presence of right artificial knee joint: Secondary | ICD-10-CM | POA: Diagnosis not present

## 2015-01-05 DIAGNOSIS — M25661 Stiffness of right knee, not elsewhere classified: Secondary | ICD-10-CM | POA: Diagnosis not present

## 2015-01-05 DIAGNOSIS — Z96651 Presence of right artificial knee joint: Secondary | ICD-10-CM | POA: Diagnosis not present

## 2015-01-05 DIAGNOSIS — R262 Difficulty in walking, not elsewhere classified: Secondary | ICD-10-CM | POA: Diagnosis not present

## 2015-01-05 DIAGNOSIS — M25561 Pain in right knee: Secondary | ICD-10-CM | POA: Diagnosis not present

## 2015-01-09 DIAGNOSIS — M25661 Stiffness of right knee, not elsewhere classified: Secondary | ICD-10-CM | POA: Diagnosis not present

## 2015-01-09 DIAGNOSIS — Z96651 Presence of right artificial knee joint: Secondary | ICD-10-CM | POA: Diagnosis not present

## 2015-01-09 DIAGNOSIS — R262 Difficulty in walking, not elsewhere classified: Secondary | ICD-10-CM | POA: Diagnosis not present

## 2015-01-09 DIAGNOSIS — M25561 Pain in right knee: Secondary | ICD-10-CM | POA: Diagnosis not present

## 2015-01-11 DIAGNOSIS — M25561 Pain in right knee: Secondary | ICD-10-CM | POA: Diagnosis not present

## 2015-01-11 DIAGNOSIS — Z96651 Presence of right artificial knee joint: Secondary | ICD-10-CM | POA: Diagnosis not present

## 2015-01-11 DIAGNOSIS — R262 Difficulty in walking, not elsewhere classified: Secondary | ICD-10-CM | POA: Diagnosis not present

## 2015-01-11 DIAGNOSIS — M25661 Stiffness of right knee, not elsewhere classified: Secondary | ICD-10-CM | POA: Diagnosis not present

## 2015-01-12 DIAGNOSIS — Z96651 Presence of right artificial knee joint: Secondary | ICD-10-CM | POA: Diagnosis not present

## 2015-01-12 DIAGNOSIS — M25661 Stiffness of right knee, not elsewhere classified: Secondary | ICD-10-CM | POA: Diagnosis not present

## 2015-01-12 DIAGNOSIS — M25561 Pain in right knee: Secondary | ICD-10-CM | POA: Diagnosis not present

## 2015-01-12 DIAGNOSIS — R262 Difficulty in walking, not elsewhere classified: Secondary | ICD-10-CM | POA: Diagnosis not present

## 2015-01-16 DIAGNOSIS — Z96651 Presence of right artificial knee joint: Secondary | ICD-10-CM | POA: Diagnosis not present

## 2015-01-17 DIAGNOSIS — M25661 Stiffness of right knee, not elsewhere classified: Secondary | ICD-10-CM | POA: Diagnosis not present

## 2015-01-17 DIAGNOSIS — M25561 Pain in right knee: Secondary | ICD-10-CM | POA: Diagnosis not present

## 2015-01-17 DIAGNOSIS — R262 Difficulty in walking, not elsewhere classified: Secondary | ICD-10-CM | POA: Diagnosis not present

## 2015-01-17 DIAGNOSIS — Z96651 Presence of right artificial knee joint: Secondary | ICD-10-CM | POA: Diagnosis not present

## 2015-01-19 DIAGNOSIS — Z96651 Presence of right artificial knee joint: Secondary | ICD-10-CM | POA: Diagnosis not present

## 2015-01-19 DIAGNOSIS — M25561 Pain in right knee: Secondary | ICD-10-CM | POA: Diagnosis not present

## 2015-01-19 DIAGNOSIS — M25661 Stiffness of right knee, not elsewhere classified: Secondary | ICD-10-CM | POA: Diagnosis not present

## 2015-01-19 DIAGNOSIS — R262 Difficulty in walking, not elsewhere classified: Secondary | ICD-10-CM | POA: Diagnosis not present

## 2015-01-23 DIAGNOSIS — R262 Difficulty in walking, not elsewhere classified: Secondary | ICD-10-CM | POA: Diagnosis not present

## 2015-01-23 DIAGNOSIS — Z96651 Presence of right artificial knee joint: Secondary | ICD-10-CM | POA: Diagnosis not present

## 2015-01-23 DIAGNOSIS — M25661 Stiffness of right knee, not elsewhere classified: Secondary | ICD-10-CM | POA: Diagnosis not present

## 2015-01-23 DIAGNOSIS — M25561 Pain in right knee: Secondary | ICD-10-CM | POA: Diagnosis not present

## 2015-01-24 DIAGNOSIS — R262 Difficulty in walking, not elsewhere classified: Secondary | ICD-10-CM | POA: Diagnosis not present

## 2015-01-24 DIAGNOSIS — M25561 Pain in right knee: Secondary | ICD-10-CM | POA: Diagnosis not present

## 2015-01-24 DIAGNOSIS — Z96651 Presence of right artificial knee joint: Secondary | ICD-10-CM | POA: Diagnosis not present

## 2015-01-24 DIAGNOSIS — M25661 Stiffness of right knee, not elsewhere classified: Secondary | ICD-10-CM | POA: Diagnosis not present

## 2015-01-26 DIAGNOSIS — M25661 Stiffness of right knee, not elsewhere classified: Secondary | ICD-10-CM | POA: Diagnosis not present

## 2015-01-26 DIAGNOSIS — R262 Difficulty in walking, not elsewhere classified: Secondary | ICD-10-CM | POA: Diagnosis not present

## 2015-01-26 DIAGNOSIS — M25561 Pain in right knee: Secondary | ICD-10-CM | POA: Diagnosis not present

## 2015-01-26 DIAGNOSIS — Z96651 Presence of right artificial knee joint: Secondary | ICD-10-CM | POA: Diagnosis not present

## 2015-01-30 DIAGNOSIS — M25561 Pain in right knee: Secondary | ICD-10-CM | POA: Diagnosis not present

## 2015-01-30 DIAGNOSIS — M25661 Stiffness of right knee, not elsewhere classified: Secondary | ICD-10-CM | POA: Diagnosis not present

## 2015-01-30 DIAGNOSIS — R262 Difficulty in walking, not elsewhere classified: Secondary | ICD-10-CM | POA: Diagnosis not present

## 2015-01-30 DIAGNOSIS — Z96651 Presence of right artificial knee joint: Secondary | ICD-10-CM | POA: Diagnosis not present

## 2015-02-01 DIAGNOSIS — R262 Difficulty in walking, not elsewhere classified: Secondary | ICD-10-CM | POA: Diagnosis not present

## 2015-02-01 DIAGNOSIS — Z96651 Presence of right artificial knee joint: Secondary | ICD-10-CM | POA: Diagnosis not present

## 2015-02-01 DIAGNOSIS — M25661 Stiffness of right knee, not elsewhere classified: Secondary | ICD-10-CM | POA: Diagnosis not present

## 2015-02-01 DIAGNOSIS — M25561 Pain in right knee: Secondary | ICD-10-CM | POA: Diagnosis not present

## 2015-02-02 DIAGNOSIS — Z96651 Presence of right artificial knee joint: Secondary | ICD-10-CM | POA: Diagnosis not present

## 2015-02-02 DIAGNOSIS — M25661 Stiffness of right knee, not elsewhere classified: Secondary | ICD-10-CM | POA: Diagnosis not present

## 2015-02-02 DIAGNOSIS — R262 Difficulty in walking, not elsewhere classified: Secondary | ICD-10-CM | POA: Diagnosis not present

## 2015-02-02 DIAGNOSIS — M25561 Pain in right knee: Secondary | ICD-10-CM | POA: Diagnosis not present

## 2015-02-06 DIAGNOSIS — Z96651 Presence of right artificial knee joint: Secondary | ICD-10-CM | POA: Diagnosis not present

## 2015-02-06 DIAGNOSIS — M25561 Pain in right knee: Secondary | ICD-10-CM | POA: Diagnosis not present

## 2015-02-06 DIAGNOSIS — R262 Difficulty in walking, not elsewhere classified: Secondary | ICD-10-CM | POA: Diagnosis not present

## 2015-02-06 DIAGNOSIS — M25661 Stiffness of right knee, not elsewhere classified: Secondary | ICD-10-CM | POA: Diagnosis not present

## 2015-02-08 DIAGNOSIS — M25561 Pain in right knee: Secondary | ICD-10-CM | POA: Diagnosis not present

## 2015-02-08 DIAGNOSIS — R262 Difficulty in walking, not elsewhere classified: Secondary | ICD-10-CM | POA: Diagnosis not present

## 2015-02-08 DIAGNOSIS — M25661 Stiffness of right knee, not elsewhere classified: Secondary | ICD-10-CM | POA: Diagnosis not present

## 2015-02-08 DIAGNOSIS — Z96651 Presence of right artificial knee joint: Secondary | ICD-10-CM | POA: Diagnosis not present

## 2015-02-09 DIAGNOSIS — M25661 Stiffness of right knee, not elsewhere classified: Secondary | ICD-10-CM | POA: Diagnosis not present

## 2015-02-09 DIAGNOSIS — Z96651 Presence of right artificial knee joint: Secondary | ICD-10-CM | POA: Diagnosis not present

## 2015-02-09 DIAGNOSIS — R262 Difficulty in walking, not elsewhere classified: Secondary | ICD-10-CM | POA: Diagnosis not present

## 2015-02-09 DIAGNOSIS — M25561 Pain in right knee: Secondary | ICD-10-CM | POA: Diagnosis not present

## 2015-02-14 DIAGNOSIS — R262 Difficulty in walking, not elsewhere classified: Secondary | ICD-10-CM | POA: Diagnosis not present

## 2015-02-14 DIAGNOSIS — Z96651 Presence of right artificial knee joint: Secondary | ICD-10-CM | POA: Diagnosis not present

## 2015-02-14 DIAGNOSIS — M25561 Pain in right knee: Secondary | ICD-10-CM | POA: Diagnosis not present

## 2015-02-14 DIAGNOSIS — M25661 Stiffness of right knee, not elsewhere classified: Secondary | ICD-10-CM | POA: Diagnosis not present

## 2015-02-15 DIAGNOSIS — Z96651 Presence of right artificial knee joint: Secondary | ICD-10-CM | POA: Diagnosis not present

## 2015-02-16 DIAGNOSIS — M25561 Pain in right knee: Secondary | ICD-10-CM | POA: Diagnosis not present

## 2015-02-16 DIAGNOSIS — R262 Difficulty in walking, not elsewhere classified: Secondary | ICD-10-CM | POA: Diagnosis not present

## 2015-02-16 DIAGNOSIS — Z96651 Presence of right artificial knee joint: Secondary | ICD-10-CM | POA: Diagnosis not present

## 2015-02-16 DIAGNOSIS — M25661 Stiffness of right knee, not elsewhere classified: Secondary | ICD-10-CM | POA: Diagnosis not present

## 2015-02-20 DIAGNOSIS — Z96651 Presence of right artificial knee joint: Secondary | ICD-10-CM | POA: Diagnosis not present

## 2015-02-20 DIAGNOSIS — M25661 Stiffness of right knee, not elsewhere classified: Secondary | ICD-10-CM | POA: Diagnosis not present

## 2015-02-20 DIAGNOSIS — R262 Difficulty in walking, not elsewhere classified: Secondary | ICD-10-CM | POA: Diagnosis not present

## 2015-02-20 DIAGNOSIS — M25561 Pain in right knee: Secondary | ICD-10-CM | POA: Diagnosis not present

## 2015-02-23 DIAGNOSIS — M25561 Pain in right knee: Secondary | ICD-10-CM | POA: Diagnosis not present

## 2015-02-23 DIAGNOSIS — R262 Difficulty in walking, not elsewhere classified: Secondary | ICD-10-CM | POA: Diagnosis not present

## 2015-02-23 DIAGNOSIS — Z96651 Presence of right artificial knee joint: Secondary | ICD-10-CM | POA: Diagnosis not present

## 2015-02-23 DIAGNOSIS — M25661 Stiffness of right knee, not elsewhere classified: Secondary | ICD-10-CM | POA: Diagnosis not present

## 2015-02-27 DIAGNOSIS — M25561 Pain in right knee: Secondary | ICD-10-CM | POA: Diagnosis not present

## 2015-02-27 DIAGNOSIS — M25661 Stiffness of right knee, not elsewhere classified: Secondary | ICD-10-CM | POA: Diagnosis not present

## 2015-02-27 DIAGNOSIS — R262 Difficulty in walking, not elsewhere classified: Secondary | ICD-10-CM | POA: Diagnosis not present

## 2015-02-27 DIAGNOSIS — Z96651 Presence of right artificial knee joint: Secondary | ICD-10-CM | POA: Diagnosis not present

## 2015-03-02 DIAGNOSIS — Z96651 Presence of right artificial knee joint: Secondary | ICD-10-CM | POA: Diagnosis not present

## 2015-03-02 DIAGNOSIS — R262 Difficulty in walking, not elsewhere classified: Secondary | ICD-10-CM | POA: Diagnosis not present

## 2015-03-02 DIAGNOSIS — M25561 Pain in right knee: Secondary | ICD-10-CM | POA: Diagnosis not present

## 2015-03-02 DIAGNOSIS — M25661 Stiffness of right knee, not elsewhere classified: Secondary | ICD-10-CM | POA: Diagnosis not present

## 2015-03-06 DIAGNOSIS — Z96651 Presence of right artificial knee joint: Secondary | ICD-10-CM | POA: Diagnosis not present

## 2015-03-06 DIAGNOSIS — R262 Difficulty in walking, not elsewhere classified: Secondary | ICD-10-CM | POA: Diagnosis not present

## 2015-03-06 DIAGNOSIS — M25561 Pain in right knee: Secondary | ICD-10-CM | POA: Diagnosis not present

## 2015-03-06 DIAGNOSIS — M25661 Stiffness of right knee, not elsewhere classified: Secondary | ICD-10-CM | POA: Diagnosis not present

## 2015-03-09 DIAGNOSIS — M25661 Stiffness of right knee, not elsewhere classified: Secondary | ICD-10-CM | POA: Diagnosis not present

## 2015-03-09 DIAGNOSIS — M25561 Pain in right knee: Secondary | ICD-10-CM | POA: Diagnosis not present

## 2015-03-09 DIAGNOSIS — Z96651 Presence of right artificial knee joint: Secondary | ICD-10-CM | POA: Diagnosis not present

## 2015-03-09 DIAGNOSIS — R262 Difficulty in walking, not elsewhere classified: Secondary | ICD-10-CM | POA: Diagnosis not present

## 2015-03-13 DIAGNOSIS — M25561 Pain in right knee: Secondary | ICD-10-CM | POA: Diagnosis not present

## 2015-03-13 DIAGNOSIS — Z96651 Presence of right artificial knee joint: Secondary | ICD-10-CM | POA: Diagnosis not present

## 2015-03-13 DIAGNOSIS — M25661 Stiffness of right knee, not elsewhere classified: Secondary | ICD-10-CM | POA: Diagnosis not present

## 2015-03-13 DIAGNOSIS — R262 Difficulty in walking, not elsewhere classified: Secondary | ICD-10-CM | POA: Diagnosis not present

## 2015-03-14 DIAGNOSIS — L668 Other cicatricial alopecia: Secondary | ICD-10-CM | POA: Diagnosis not present

## 2015-03-15 DIAGNOSIS — M25561 Pain in right knee: Secondary | ICD-10-CM | POA: Diagnosis not present

## 2015-03-15 DIAGNOSIS — M25661 Stiffness of right knee, not elsewhere classified: Secondary | ICD-10-CM | POA: Diagnosis not present

## 2015-03-15 DIAGNOSIS — R262 Difficulty in walking, not elsewhere classified: Secondary | ICD-10-CM | POA: Diagnosis not present

## 2015-03-15 DIAGNOSIS — Z96651 Presence of right artificial knee joint: Secondary | ICD-10-CM | POA: Diagnosis not present

## 2015-03-20 DIAGNOSIS — R262 Difficulty in walking, not elsewhere classified: Secondary | ICD-10-CM | POA: Diagnosis not present

## 2015-03-20 DIAGNOSIS — M25561 Pain in right knee: Secondary | ICD-10-CM | POA: Diagnosis not present

## 2015-03-20 DIAGNOSIS — M25661 Stiffness of right knee, not elsewhere classified: Secondary | ICD-10-CM | POA: Diagnosis not present

## 2015-03-20 DIAGNOSIS — Z96651 Presence of right artificial knee joint: Secondary | ICD-10-CM | POA: Diagnosis not present

## 2015-03-23 DIAGNOSIS — M25661 Stiffness of right knee, not elsewhere classified: Secondary | ICD-10-CM | POA: Diagnosis not present

## 2015-03-23 DIAGNOSIS — R262 Difficulty in walking, not elsewhere classified: Secondary | ICD-10-CM | POA: Diagnosis not present

## 2015-03-23 DIAGNOSIS — Z96651 Presence of right artificial knee joint: Secondary | ICD-10-CM | POA: Diagnosis not present

## 2015-03-23 DIAGNOSIS — M25561 Pain in right knee: Secondary | ICD-10-CM | POA: Diagnosis not present

## 2015-03-27 DIAGNOSIS — M25561 Pain in right knee: Secondary | ICD-10-CM | POA: Diagnosis not present

## 2015-03-27 DIAGNOSIS — R262 Difficulty in walking, not elsewhere classified: Secondary | ICD-10-CM | POA: Diagnosis not present

## 2015-03-27 DIAGNOSIS — M25661 Stiffness of right knee, not elsewhere classified: Secondary | ICD-10-CM | POA: Diagnosis not present

## 2015-03-27 DIAGNOSIS — Z96651 Presence of right artificial knee joint: Secondary | ICD-10-CM | POA: Diagnosis not present

## 2015-03-30 DIAGNOSIS — M25561 Pain in right knee: Secondary | ICD-10-CM | POA: Diagnosis not present

## 2015-03-30 DIAGNOSIS — M75121 Complete rotator cuff tear or rupture of right shoulder, not specified as traumatic: Secondary | ICD-10-CM | POA: Diagnosis not present

## 2015-03-30 DIAGNOSIS — M25661 Stiffness of right knee, not elsewhere classified: Secondary | ICD-10-CM | POA: Diagnosis not present

## 2015-03-30 DIAGNOSIS — R531 Weakness: Secondary | ICD-10-CM | POA: Diagnosis not present

## 2015-04-03 DIAGNOSIS — R262 Difficulty in walking, not elsewhere classified: Secondary | ICD-10-CM | POA: Diagnosis not present

## 2015-04-03 DIAGNOSIS — Z96651 Presence of right artificial knee joint: Secondary | ICD-10-CM | POA: Diagnosis not present

## 2015-04-03 DIAGNOSIS — M25561 Pain in right knee: Secondary | ICD-10-CM | POA: Diagnosis not present

## 2015-04-03 DIAGNOSIS — M25661 Stiffness of right knee, not elsewhere classified: Secondary | ICD-10-CM | POA: Diagnosis not present

## 2015-04-06 DIAGNOSIS — M25561 Pain in right knee: Secondary | ICD-10-CM | POA: Diagnosis not present

## 2015-04-06 DIAGNOSIS — R262 Difficulty in walking, not elsewhere classified: Secondary | ICD-10-CM | POA: Diagnosis not present

## 2015-04-06 DIAGNOSIS — Z96651 Presence of right artificial knee joint: Secondary | ICD-10-CM | POA: Diagnosis not present

## 2015-04-06 DIAGNOSIS — M25661 Stiffness of right knee, not elsewhere classified: Secondary | ICD-10-CM | POA: Diagnosis not present

## 2015-04-10 DIAGNOSIS — Z96651 Presence of right artificial knee joint: Secondary | ICD-10-CM | POA: Diagnosis not present

## 2015-04-12 DIAGNOSIS — R262 Difficulty in walking, not elsewhere classified: Secondary | ICD-10-CM | POA: Diagnosis not present

## 2015-04-12 DIAGNOSIS — M25561 Pain in right knee: Secondary | ICD-10-CM | POA: Diagnosis not present

## 2015-04-12 DIAGNOSIS — Z96651 Presence of right artificial knee joint: Secondary | ICD-10-CM | POA: Diagnosis not present

## 2015-04-12 DIAGNOSIS — M25661 Stiffness of right knee, not elsewhere classified: Secondary | ICD-10-CM | POA: Diagnosis not present

## 2015-04-13 DIAGNOSIS — R262 Difficulty in walking, not elsewhere classified: Secondary | ICD-10-CM | POA: Diagnosis not present

## 2015-04-13 DIAGNOSIS — M25561 Pain in right knee: Secondary | ICD-10-CM | POA: Diagnosis not present

## 2015-04-13 DIAGNOSIS — M25661 Stiffness of right knee, not elsewhere classified: Secondary | ICD-10-CM | POA: Diagnosis not present

## 2015-04-13 DIAGNOSIS — Z96651 Presence of right artificial knee joint: Secondary | ICD-10-CM | POA: Diagnosis not present

## 2015-05-15 ENCOUNTER — Ambulatory Visit (INDEPENDENT_AMBULATORY_CARE_PROVIDER_SITE_OTHER): Payer: Medicare Other | Admitting: Internal Medicine

## 2015-05-15 ENCOUNTER — Other Ambulatory Visit (INDEPENDENT_AMBULATORY_CARE_PROVIDER_SITE_OTHER): Payer: Medicare Other

## 2015-05-15 ENCOUNTER — Encounter: Payer: Self-pay | Admitting: Internal Medicine

## 2015-05-15 VITALS — BP 132/82 | HR 59 | Temp 97.4°F | Ht 64.0 in | Wt 261.5 lb

## 2015-05-15 DIAGNOSIS — Z202 Contact with and (suspected) exposure to infections with a predominantly sexual mode of transmission: Secondary | ICD-10-CM

## 2015-05-15 DIAGNOSIS — E785 Hyperlipidemia, unspecified: Secondary | ICD-10-CM | POA: Diagnosis not present

## 2015-05-15 DIAGNOSIS — R739 Hyperglycemia, unspecified: Secondary | ICD-10-CM | POA: Diagnosis not present

## 2015-05-15 DIAGNOSIS — Z0000 Encounter for general adult medical examination without abnormal findings: Secondary | ICD-10-CM

## 2015-05-15 DIAGNOSIS — E042 Nontoxic multinodular goiter: Secondary | ICD-10-CM

## 2015-05-15 DIAGNOSIS — R5383 Other fatigue: Secondary | ICD-10-CM

## 2015-05-15 DIAGNOSIS — E559 Vitamin D deficiency, unspecified: Secondary | ICD-10-CM

## 2015-05-15 DIAGNOSIS — I10 Essential (primary) hypertension: Secondary | ICD-10-CM | POA: Diagnosis not present

## 2015-05-15 DIAGNOSIS — Z23 Encounter for immunization: Secondary | ICD-10-CM | POA: Diagnosis not present

## 2015-05-15 LAB — BASIC METABOLIC PANEL
BUN: 14 mg/dL (ref 6–23)
CHLORIDE: 103 meq/L (ref 96–112)
CO2: 29 meq/L (ref 19–32)
CREATININE: 0.67 mg/dL (ref 0.40–1.20)
Calcium: 9.9 mg/dL (ref 8.4–10.5)
GFR: 113.44 mL/min (ref >60.00)
Glucose, Bld: 95 mg/dL (ref 70–99)
Potassium: 3.7 mEq/L (ref 3.5–5.1)
Sodium: 140 mEq/L (ref 135–145)

## 2015-05-15 LAB — LIPID PANEL
CHOL/HDL RATIO: 3
Cholesterol: 186 mg/dL (ref 0–200)
HDL: 55.3 mg/dL (ref >39.00)
LDL CALC: 111 mg/dL — AB (ref 0–99)
NONHDL: 130.68
Triglycerides: 100 mg/dL (ref 0.0–149.0)
VLDL: 20 mg/dL (ref 0.0–40.0)

## 2015-05-15 LAB — CBC WITH DIFFERENTIAL/PLATELET
BASOS PCT: 0.5 % (ref 0.0–3.0)
Basophils Absolute: 0 10*3/uL (ref 0.0–0.1)
EOS ABS: 0.2 10*3/uL (ref 0.0–0.7)
Eosinophils Relative: 2.9 % (ref 0.0–5.0)
HCT: 38.9 % (ref 36.0–46.0)
Hemoglobin: 12.8 g/dL (ref 12.0–15.0)
LYMPHS PCT: 23.2 % (ref 12.0–46.0)
Lymphs Abs: 1.5 10*3/uL (ref 0.7–4.0)
MCHC: 32.8 g/dL (ref 30.0–36.0)
MCV: 83.5 fl (ref 78.0–100.0)
Monocytes Absolute: 0.8 10*3/uL (ref 0.1–1.0)
Monocytes Relative: 12.6 % — ABNORMAL HIGH (ref 3.0–12.0)
NEUTROS ABS: 4 10*3/uL (ref 1.4–7.7)
NEUTROS PCT: 60.8 % (ref 43.0–77.0)
Platelets: 389 10*3/uL (ref 150.0–400.0)
RBC: 4.66 Mil/uL (ref 3.87–5.11)
RDW: 14.1 % (ref 11.5–15.5)
WBC: 6.5 10*3/uL (ref 4.0–10.5)

## 2015-05-15 LAB — HEPATIC FUNCTION PANEL
ALK PHOS: 71 U/L (ref 39–117)
ALT: 15 U/L (ref 0–35)
AST: 18 U/L (ref 0–37)
Albumin: 3.8 g/dL (ref 3.5–5.2)
BILIRUBIN TOTAL: 0.4 mg/dL (ref 0.2–1.2)
Bilirubin, Direct: 0.1 mg/dL (ref 0.0–0.3)
Total Protein: 8.3 g/dL (ref 6.0–8.3)

## 2015-05-15 LAB — HEMOGLOBIN A1C: Hgb A1c MFr Bld: 6.3 % (ref 4.6–6.5)

## 2015-05-15 LAB — VITAMIN D 25 HYDROXY (VIT D DEFICIENCY, FRACTURES): VITD: 29.66 ng/mL — AB (ref 30.00–100.00)

## 2015-05-15 LAB — TSH: TSH: 2.84 u[IU]/mL (ref 0.35–4.50)

## 2015-05-15 NOTE — Assessment & Plan Note (Signed)
On OTC meds - recheck same now at pt request DEXA  12/2013 reviewed: osteopenia, mild Continue WB exercises and Vit D with Ca supplements

## 2015-05-15 NOTE — Assessment & Plan Note (Addendum)
rx'd prava 20 mg 05/2014, but stopped same due to severe arthralgia and myalgia prava was resumed 12/2014 post TKR, remains on same at this time The patient is asked to make an attempt to improve diet and exercise patterns to aid in medical management of this problem.  Check annually and consider other tx or dose reduction for primary prevention as needed given co-dx HTN

## 2015-05-15 NOTE — Assessment & Plan Note (Signed)
BP Readings from Last 3 Encounters:  05/15/15 132/82  12/15/14 130/66  12/14/14 134/60   Changed lotrel to max dose ARB+hctz 06/2011 due to $ Cough hx with ACEI reviewed The current medical regimen is generally effective; continue present plan and medications. The patient is asked to make an attempt to improve diet and exercise patterns to aid in medical management of this problem.

## 2015-05-15 NOTE — Progress Notes (Signed)
Pre visit review using our clinic review tool, if applicable. No additional management support is needed unless otherwise documented below in the visit note. 

## 2015-05-15 NOTE — Patient Instructions (Addendum)
It was good to see you today.  We have reviewed your prior records including labs and tests today  Annual flu shot and Prevnar 13 pneumonia vaccinations administered today  Other Health Maintenance reviewed - all other recommended immunizations and age-appropriate screenings are up-to-date.  Test(s) ordered today. Your results will be released to Frances Gray (or called to you) after review, usually within 72hours after test completion. If any changes need to be made, you will be notified at that same time.  Medications reviewed and updated, no changes recommended at this time.  Please schedule followup in 12 months for annual exam and labs with new primary care doctor, Dr. Quay Burow, call sooner if problems.   Health Maintenance, Female Adopting a healthy lifestyle and getting preventive care can go a long way to promote health and wellness. Talk with your health care provider about what schedule of regular examinations is right for you. This is a good chance for you to check in with your provider about disease prevention and staying healthy. In between checkups, there are plenty of things you can do on your own. Experts have done a lot of research about which lifestyle changes and preventive measures are most likely to keep you healthy. Ask your health care provider for more information. WEIGHT AND DIET  Eat a healthy diet  Be sure to include plenty of vegetables, fruits, low-fat dairy products, and lean protein.  Do not eat a lot of foods high in solid fats, added sugars, or salt.  Get regular exercise. This is one of the most important things you can do for your health.  Most adults should exercise for at least 150 minutes each week. The exercise should increase your heart rate and make you sweat (moderate-intensity exercise).  Most adults should also do strengthening exercises at least twice a week. This is in addition to the moderate-intensity exercise.  Maintain a healthy weight  Body  mass index (BMI) is a measurement that can be used to identify possible weight problems. It estimates body fat based on height and weight. Your health care provider can help determine your BMI and help you achieve or maintain a healthy weight.  For females 38 years of age and older:   A BMI below 18.5 is considered underweight.  A BMI of 18.5 to 24.9 is normal.  A BMI of 25 to 29.9 is considered overweight.  A BMI of 30 and above is considered obese.  Watch levels of cholesterol and blood lipids  You should start having your blood tested for lipids and cholesterol at 65 years of age, then have this test every 5 years.  You may need to have your cholesterol levels checked more often if:  Your lipid or cholesterol levels are high.  You are older than 65 years of age.  You are at high risk for heart disease.  CANCER SCREENING   Lung Cancer  Lung cancer screening is recommended for adults 65-66 years old who are at high risk for lung cancer because of a history of smoking.  A yearly low-dose CT scan of the lungs is recommended for people who:  Currently smoke.  Have quit within the past 15 years.  Have at least a 30-pack-year history of smoking. A pack year is smoking an average of one pack of cigarettes a day for 1 year.  Yearly screening should continue until it has been 15 years since you quit.  Yearly screening should stop if you develop a health problem that would prevent  you from having lung cancer treatment.  Breast Cancer  Practice breast self-awareness. This means understanding how your breasts normally appear and feel.  It also means doing regular breast self-exams. Let your health care provider know about any changes, no matter how small.  If you are in your 20s or 30s, you should have a clinical breast exam (CBE) by a health care provider every 1-3 years as part of a regular health exam.  If you are 65 or older, have a CBE every year. Also consider having a  breast X-ray (mammogram) every year.  If you have a family history of breast cancer, talk to your health care provider about genetic screening.  If you are at high risk for breast cancer, talk to your health care provider about having an MRI and a mammogram every year.  Breast cancer gene (BRCA) assessment is recommended for women who have family members with BRCA-related cancers. BRCA-related cancers include:  Breast.  Ovarian.  Tubal.  Peritoneal cancers.  Results of the assessment will determine the need for genetic counseling and BRCA1 and BRCA2 testing. Cervical Cancer Your health care provider may recommend that you be screened regularly for cancer of the pelvic organs (ovaries, uterus, and vagina). This screening involves a pelvic examination, including checking for microscopic changes to the surface of your cervix (Pap test). You may be encouraged to have this screening done every 3 years, beginning at age 65.  For women ages 37-65, health care providers may recommend pelvic exams and Pap testing every 3 years, or they may recommend the Pap and pelvic exam, combined with testing for human papilloma virus (HPV), every 5 years. Some types of HPV increase your risk of cervical cancer. Testing for HPV may also be done on women of any age with unclear Pap test results.  Other health care providers may not recommend any screening for nonpregnant women who are considered low risk for pelvic cancer and who do not have symptoms. Ask your health care provider if a screening pelvic exam is right for you.  If you have had past treatment for cervical cancer or a condition that could lead to cancer, you need Pap tests and screening for cancer for at least 20 years after your treatment. If Pap tests have been discontinued, your risk factors (such as having a new sexual partner) need to be reassessed to determine if screening should resume. Some women have medical problems that increase the chance of  getting cervical cancer. In these cases, your health care provider may recommend more frequent screening and Pap tests. Colorectal Cancer  This type of cancer can be detected and often prevented.  Routine colorectal cancer screening usually begins at 65 years of age and continues through 65 years of age.  Your health care provider may recommend screening at an earlier age if you have risk factors for colon cancer.  Your health care provider may also recommend using home test kits to check for hidden blood in the stool.  A small camera at the end of a tube can be used to examine your colon directly (sigmoidoscopy or colonoscopy). This is done to check for the earliest forms of colorectal cancer.  Routine screening usually begins at age 46.  Direct examination of the colon should be repeated every 5-10 years through 65 years of age. However, you may need to be screened more often if early forms of precancerous polyps or small growths are found. Skin Cancer  Check your skin from head to  toe regularly.  Tell your health care provider about any new moles or changes in moles, especially if there is a change in a mole's shape or color.  Also tell your health care provider if you have a mole that is larger than the size of a pencil eraser.  Always use sunscreen. Apply sunscreen liberally and repeatedly throughout the day.  Protect yourself by wearing long sleeves, pants, a wide-brimmed hat, and sunglasses whenever you are outside. HEART DISEASE, DIABETES, AND HIGH BLOOD PRESSURE   High blood pressure causes heart disease and increases the risk of stroke. High blood pressure is more likely to develop in:  People who have blood pressure in the high end of the normal range (130-139/85-89 mm Hg).  People who are overweight or obese.  People who are African American.  If you are 77-82 years of age, have your blood pressure checked every 3-5 years. If you are 29 years of age or older, have  your blood pressure checked every year. You should have your blood pressure measured twice--once when you are at a hospital or clinic, and once when you are not at a hospital or clinic. Record the average of the two measurements. To check your blood pressure when you are not at a hospital or clinic, you can use:  An automated blood pressure machine at a pharmacy.  A home blood pressure monitor.  If you are between 26 years and 8 years old, ask your health care provider if you should take aspirin to prevent strokes.  Have regular diabetes screenings. This involves taking a blood sample to check your fasting blood sugar level.  If you are at a normal weight and have a low risk for diabetes, have this test once every three years after 65 years of age.  If you are overweight and have a high risk for diabetes, consider being tested at a younger age or more often. PREVENTING INFECTION  Hepatitis B  If you have a higher risk for hepatitis B, you should be screened for this virus. You are considered at high risk for hepatitis B if:  You were born in a country where hepatitis B is common. Ask your health care provider which countries are considered high risk.  Your parents were born in a high-risk country, and you have not been immunized against hepatitis B (hepatitis B vaccine).  You have HIV or AIDS.  You use needles to inject street drugs.  You live with someone who has hepatitis B.  You have had sex with someone who has hepatitis B.  You get hemodialysis treatment.  You take certain medicines for conditions, including cancer, organ transplantation, and autoimmune conditions. Hepatitis C  Blood testing is recommended for:  Everyone born from [birth year] through 1966.  Anyone with known risk factors for hepatitis C. Sexually transmitted infections (STIs)  You should be screened for sexually transmitted infections (STIs) including gonorrhea and chlamydia if:  You are sexually active and  are younger than 65 years of age.  You are older than 65 years of age and your health care provider tells you that you are at risk for this type of infection.  Your sexual activity has changed since you were last screened and you are at an increased risk for chlamydia or gonorrhea. Ask your health care provider if you are at risk.  If you do not have HIV, but are at risk, it may be recommended that you take a prescription medicine daily to prevent HIV infection. This  is called pre-exposure prophylaxis (PrEP). You are considered at risk if:  You are sexually active and do not regularly use condoms or know the HIV status of your partner(s).  You take drugs by injection.  You are sexually active with a partner who has HIV. Talk with your health care provider about whether you are at high risk of being infected with HIV. If you choose to begin PrEP, you should first be tested for HIV. You should then be tested every 3 months for as long as you are taking PrEP.  PREGNANCY   If you are premenopausal and you may become pregnant, ask your health care provider about preconception counseling.  If you may become pregnant, take 400 to 800 micrograms (mcg) of folic acid every day.  If you want to prevent pregnancy, talk to your health care provider about birth control (contraception). OSTEOPOROSIS AND MENOPAUSE   Osteoporosis is a disease in which the bones lose minerals and strength with aging. This can result in serious bone fractures. Your risk for osteoporosis can be identified using a bone density scan.  If you are 88 years of age or older, or if you are at risk for osteoporosis and fractures, ask your health care provider if you should be screened.  Ask your health care provider whether you should take a calcium or vitamin D supplement to lower your risk for osteoporosis.  Menopause may have certain physical symptoms and risks.  Hormone replacement therapy may reduce some of these symptoms  and risks. Talk to your health care provider about whether hormone replacement therapy is right for you.  HOME CARE INSTRUCTIONS   Schedule regular health, dental, and eye exams.  Stay current with your immunizations.   Do not use any tobacco products including cigarettes, chewing tobacco, or electronic cigarettes.  If you are pregnant, do not drink alcohol.  If you are breastfeeding, limit how much and how often you drink alcohol.  Limit alcohol intake to no more than 1 drink per day for nonpregnant women. One drink equals 12 ounces of beer, 5 ounces of wine, or 1 ounces of hard liquor.  Do not use street drugs.  Do not share needles.  Ask your health care provider for help if you need support or information about quitting drugs.  Tell your health care provider if you often feel depressed.  Tell your health care provider if you have ever been abused or do not feel safe at home.   This information is not intended to replace advice given to you by your health care provider. Make sure you discuss any questions you have with your health care provider.   Document Released: 12/10/2010 Document Revised: 06/17/2014 Document Reviewed: 04/28/2013 Elsevier Interactive Patient Education [employer].

## 2015-05-15 NOTE — Progress Notes (Signed)
Subjective:    Patient ID: Frances Gray, female    DOB: 12-31-1950, 64 y.o.   MRN: [id number]  HPI   Here for welcome to medicare/annual wellness  Diet: heart healthy and low carb Physical activity: sedentary Depression/mood screen: negative Hearing: intact to whispered voice Visual acuity: grossly normal, performs annual eye exam  ADLs: capable Fall risk: none Home safety: good Cognitive evaluation: intact to orientation, naming, recall and repetition EOL planning: adv directives, full code/ I agree  I have personally reviewed and have noted 1. The patient's medical and social history 2. Their use of alcohol, tobacco or illicit drugs 3. Their current medications and supplements 4. The patient's functional ability including ADL's, fall risks, home safety risks and hearing or visual impairment. 5. Diet and physical activities 6. Evidence for depression or mood disorders  Also reviewed chronic medical conditions, interval events and current concerns  Past Medical History  Diagnosis Date  . 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 Allergic rhinitis, seasonal     takes Claritin daily as needed and uses Flonase if needed  . Diverticulitis   . Heart murmur     no symptoms  . Vitamin D deficiency disease 08/14/2011    takes Vit D daily  . Hyperlipidemia   . Hypertension     takes Hyzaar daily  . History of bronchitis winter of 2014  . GERD (gastroesophageal reflux disease)     doesn't take any meds  . History of colon polyps     benign  . History of kidney stones    Family History  Problem Relation Age of Onset  . Ovarian cancer Mother   . Colon cancer Sister 48  . Breast cancer Sister   . Esophageal cancer Neg Hx    Social History  Substance Use Topics  . Smoking status: Former Smoker -- 0.25 packs/day for 10 years  . Smokeless tobacco: Never Used     Comment: quit smoking 27yrs ago  . Alcohol Use: No    Review of Systems    Constitutional: Negative for fatigue and unexpected weight change.  Respiratory: Negative for cough, shortness of breath and wheezing.   Cardiovascular: Negative for chest pain, palpitations and leg swelling.  Gastrointestinal: Negative for nausea, abdominal pain and diarrhea.  Neurological: Negative for dizziness, weakness, light-headedness and headaches.  Psychiatric/Behavioral: Negative for dysphoric mood. The patient is not nervous/anxious.   All other systems reviewed and are negative.   Patient Care Team: Rowe Clack, MD as PCP - General (Internal Medicine) Festus Aloe, MD (Urology) Marchia Bond, MD (Orthopedic Surgery) Armandina Gemma, MD (General Surgery) Katy Apo, MD (Ophthalmology) Princess Bruins, MD (Obstetrics and Gynecology) Inda Castle, MD (Gastroenterology)     Objective:    Physical Exam  Constitutional: She is oriented to person, place, and time. She appears well-developed and well-nourished. No distress.  obese  HENT:  Head: Normocephalic and atraumatic.  Right Ear: External ear normal.  Left Ear: External ear normal.  Nose: Nose normal.  Mouth/Throat: Oropharynx is clear and moist. No oropharyngeal exudate.  Eyes: EOM are normal. Pupils are equal, round, and reactive to light. Right eye exhibits no discharge. Left eye exhibits no discharge. No scleral icterus.  Neck: Normal range of motion. Neck supple. No JVD present. No tracheal deviation present. No thyromegaly present.  Cardiovascular: Normal rate, regular rhythm, normal heart sounds and intact distal pulses.  Exam reveals no friction rub.  No murmur heard. Pulmonary/Chest: Effort normal and breath sounds normal. No respiratory distress. She has no wheezes. She has no rales. She exhibits no tenderness.  Abdominal: Soft. Bowel sounds are normal. She exhibits no distension and no mass. There is no tenderness. There is no rebound and no guarding.  Musculoskeletal: Normal range of motion.   Lymphadenopathy:    She has no cervical adenopathy.  Neurological: She is alert and oriented to person, place, and time. She has normal reflexes. No cranial nerve deficit.  Skin: Skin is warm and dry. No rash noted. She is not diaphoretic. No erythema.  Psychiatric: She has a normal mood and affect. Her behavior is normal. Judgment and thought content normal.  Nursing note and vitals reviewed.   BP 132/82 mmHg  Pulse 59  Temp(Src) 97.4 F (36.3 C) (Oral)  Ht 5\' 4"  (1.626 m)  Wt 261 lb 8 oz (118.616 kg)  BMI 44.86 kg/m2  SpO2 94% Wt Readings from Last 3 Encounters:  05/15/15 261 lb 8 oz (118.616 kg)  12/15/14 263 lb (119.296 kg)  12/09/14 265 lb (120.203 kg)    Lab Results  Component Value Date   WBC 9.3 12/08/2014   HGB 10.9* 12/08/2014   HCT 32.4* 12/08/2014   PLT 307 12/08/2014   GLUCOSE 118* 12/07/2014   CHOL 224* 05/12/2014   TRIG 103.0 05/12/2014   HDL 48.80 05/12/2014   LDLCALC 155* 05/12/2014   ALT 19 05/12/2014   AST 22 05/12/2014   NA 133* 12/07/2014   K 3.5 12/07/2014   CL 97* 12/07/2014   CREATININE 0.80 12/07/2014   BUN 12 12/07/2014   CO2 26 12/07/2014   TSH 3.51 05/12/2014   INR 1.04 11/10/2012    No results found.     Assessment & Plan:   Welcome to Medicare/z00.00 - Patient is here for "welcome to medicare" evaluation. Risk factors for depression per USPSTF are reviewed and negative. Patient hearing function is screened today; ADLs are reviewed and addressed as needed; functional ability and  level of safety have been reviewed and are appropriate. Education, counseling, and referrals are performed based on age appropriate health issues. Patient has information regarding separate preventitive health services covered by medicare part b.  Problem List Items Addressed This Visit    Hyperlipidemia    rx'd prava 20 mg 05/2014, but stopped same due to severe arthralgia and myalgia prava was resumed 12/2014 post TKR, remains on same at this  time The patient is asked to make an attempt to improve diet and exercise patterns to aid in medical management of this problem.  Check annually and consider other tx or dose reduction for primary prevention as needed given co-dx HTN      Relevant Orders   Lipid panel   Hypertension    BP Readings from Last 3 Encounters:  05/15/15 132/82  12/15/14 130/66  12/14/14 134/60   Changed lotrel to max dose ARB+hctz 06/2011 due to $ Cough hx with ACEI reviewed The current medical regimen is generally effective; continue present plan and medications. The patient is asked to make an attempt to improve diet and exercise patterns to aid in medical management of this problem.       Relevant Orders   Basic metabolic panel   Multinodular goiter (nontoxic)    12/2011 bx - Hashimoto's thyroiditis, no malignancy ultrasound 01/2013: stable size solid nodules; unchanged on 10/2014 [place] - reviewed same today Follows with [facility] prn Lab Results  Component Value Date   TSH  3.51 05/12/2014        Relevant Orders   TSH   Vitamin D deficiency disease    On OTC meds - recheck same now at pt request DEXA  12/2013 reviewed: osteopenia, mild Continue WB exercises and Vit D with Ca supplements      Relevant Orders   VITAMIN D 25 Hydroxy (Vit-D Deficiency, Fractures)    Other Visit Diagnoses    Routine general medical examination at a health care facility    -  Primary    Need for prophylactic vaccination against Streptococcus pneumoniae (pneumococcus)        Relevant Orders    Pneumococcal conjugate vaccine 13-valent (Completed)    Need for prophylactic vaccination and inoculation against influenza        Relevant Orders    Flu vaccine HIGH DOSE PF (Fluzone High dose) (Completed)    Other fatigue        Relevant Orders    Basic metabolic panel    CBC with Differential/Platelet    Hepatic function panel    TSH    Exposure to venereal disease        Relevant Orders    Hepatitis C antibody         Gwendolyn Grant, MD

## 2015-05-15 NOTE — Assessment & Plan Note (Signed)
12/2011 bx - Hashimoto's thyroiditis, no malignancy ultrasound 01/2013: stable size solid nodules; unchanged on 10/2014 [place] - reviewed same today Follows with [facility] prn Lab Results  Component Value Date   TSH 3.51 05/12/2014

## 2015-05-16 LAB — HEPATITIS C ANTIBODY: HCV AB: NEGATIVE

## 2015-05-22 DIAGNOSIS — M25561 Pain in right knee: Secondary | ICD-10-CM | POA: Diagnosis not present

## 2015-07-20 ENCOUNTER — Telehealth: Payer: Self-pay | Admitting: *Deleted

## 2015-07-20 MED ORDER — PRAVASTATIN SODIUM 20 MG PO TABS: 20.0000 mg | ORAL_TABLET | Freq: Every day | ORAL | Status: DC

## 2015-07-20 NOTE — Telephone Encounter (Signed)
Receive call pt is needing new script sent on her Pravastatin. Change over to Dr. Quay Burow appt scheduled for cpx in Dec.../lmb

## 2015-08-07 ENCOUNTER — Other Ambulatory Visit: Payer: Self-pay

## 2015-08-07 MED ORDER — LOSARTAN POTASSIUM-HCTZ 100-25 MG PO TABS: 1.0000 | ORAL_TABLET | Freq: Every day | ORAL | Status: DC

## 2015-08-07 NOTE — Telephone Encounter (Signed)
Pt called requesting refill of Losartan, Rx sent to pharmacy

## 2015-08-21 DIAGNOSIS — M25561 Pain in right knee: Secondary | ICD-10-CM | POA: Diagnosis not present

## 2015-09-27 DIAGNOSIS — Z1231 Encounter for screening mammogram for malignant neoplasm of breast: Secondary | ICD-10-CM | POA: Diagnosis not present

## 2015-09-27 DIAGNOSIS — Z01419 Encounter for gynecological examination (general) (routine) without abnormal findings: Secondary | ICD-10-CM | POA: Diagnosis not present

## 2015-09-27 DIAGNOSIS — Z124 Encounter for screening for malignant neoplasm of cervix: Secondary | ICD-10-CM | POA: Diagnosis not present

## 2015-09-27 LAB — HM MAMMOGRAPHY

## 2015-11-13 DIAGNOSIS — M25562 Pain in left knee: Secondary | ICD-10-CM | POA: Diagnosis not present

## 2015-11-13 DIAGNOSIS — M25561 Pain in right knee: Secondary | ICD-10-CM | POA: Diagnosis not present

## 2015-11-29 DIAGNOSIS — L308 Other specified dermatitis: Secondary | ICD-10-CM | POA: Diagnosis not present

## 2016-01-23 ENCOUNTER — Ambulatory Visit (INDEPENDENT_AMBULATORY_CARE_PROVIDER_SITE_OTHER): Payer: Medicare Other | Admitting: Family

## 2016-01-23 ENCOUNTER — Encounter: Payer: Self-pay | Admitting: Family

## 2016-01-23 VITALS — BP 116/74 | HR 60 | Temp 97.7°F | Resp 18 | Ht 64.0 in | Wt 275.0 lb

## 2016-01-23 DIAGNOSIS — M6789 Other specified disorders of synovium and tendon, multiple sites: Secondary | ICD-10-CM

## 2016-01-23 DIAGNOSIS — M6790 Unspecified disorder of synovium and tendon, unspecified site: Secondary | ICD-10-CM | POA: Insufficient documentation

## 2016-01-23 DIAGNOSIS — M545 Low back pain, unspecified: Secondary | ICD-10-CM

## 2016-01-23 NOTE — Progress Notes (Signed)
Subjective:    Patient ID: Frances Gray, female    DOB: [birth year], 65 y.o.   MRN: [id number]  Chief Complaint  Patient presents with  . Cyst    has a "knot" on left thumb that is tender, lower back has been bothering her as well    HPI:  Frances Gray is a 65 y.o. female who  has a past medical history of Allergic rhinitis, seasonal; Arthritis; Diverticulitis; GERD (gastroesophageal reflux disease); Heart murmur; History of bronchitis (winter of 2014); History of colon polyps; History of kidney stones; Hyperlipidemia; Hypertension; and Vitamin D deficiency disease (08/14/2011). and presents today for an office visit.  1.) Left thumb - This is a new problem. Associated symptom of a knot located on the left thumb has been going on for a couple of weeks. Described as tender and aggravated by squeezing. No trauma or injury. Modifying factors include warm soaks which did not help very much. No numbness/tingling. Normal range of motion with no limitiation.    2.) Low back pain - Associated symptom of pain located in her lumbar spine has been going on for a couple of months. No trauma or injury. Pain is described as constant and achy with varying severity. No numbness and tingling in legs. No changes in bowel or bladder habits. Modifying factors include rest. Aggravated by standing. No history of back injury.     Allergies  Allergen Reactions  . Amlodipine Cough  . Pravastatin     Joint ache     Current Outpatient Prescriptions on File Prior to Visit  Medication Sig Dispense Refill  . Biotin (BIOTIN 5000) 5 MG CAPS Take 5 mg by mouth every morning.     . calcium carbonate (CALCIUM 600) 600 MG TABS tablet Take 1 tablet (600 mg total) by mouth 2 (two) times daily with a meal. 60 tablet 6  . Cholecalciferol (VITAMIN D3) 2000 UNITS TABS Take 2,000 Units by mouth every morning.     Marland Kitchen FLUOCINOLONE ACETONIDE SCALP 0.01 % OIL Apply 1 mL topically daily as needed (itchy scalp).   5  .  fluticasone (FLONASE) 50 MCG/ACT nasal spray Place 1 spray into both nostrils daily as needed for allergies or rhinitis.   0  . loratadine (CLARITIN) 10 MG tablet Take 10 mg by mouth daily as needed for allergies.    Marland Kitchen losartan-hydrochlorothiazide (HYZAAR) 100-25 MG tablet Take 1 tablet by mouth daily. 90 tablet 2  . Multiple Vitamin (MULTIVITAMIN) tablet Take 1 tablet by mouth every morning.     . pravastatin (PRAVACHOL) 20 MG tablet Take 1 tablet (20 mg total) by mouth daily. 90 tablet 2  . sennosides-docusate sodium (SENOKOT-S) 8.6-50 MG tablet Take 2 tablets by mouth daily. 30 tablet 1   No current facility-administered medications on file prior to visit.      Past Surgical History:  Procedure Laterality Date  . BREAST BIOPSY Left   . Breast ultrasound Right 03/02/13   The 47mm complex cyst in the right breast is probably benign. Follow-up in 6 months with bilateral mammogram and (R) ultrasound  . Breast Ultrasound Right 09/02/13   There is a benign 3 mm complex cyst in the (R) breast at 9 o'clock  . COLONOSCOPY    . ESOPHAGUS SURGERY  1996   tumor removed   (benign)  . KNEE ARTHROSCOPY     bilateral  . LITHOTRIPSY  08/2011   R ureteral stone  . TOTAL KNEE ARTHROPLASTY Left 11/16/2012   Procedure: TOTAL KNEE  ARTHROPLASTY- left;  Surgeon: Johnny Bridge, MD;  Location: [facility];  Service: Orthopedics;  Laterality: Left;  . TOTAL KNEE ARTHROPLASTY Right 12/06/2014   Procedure: TOTAL KNEE ARTHROPLASTY;  Surgeon: Marchia Bond, MD;  Location: [facility];  Service: Orthopedics;  Laterality: Right;    Past Medical History:  Diagnosis Date  . Allergic rhinitis, seasonal    takes Claritin daily as needed and uses Flonase if needed  . 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 Diverticulitis   . GERD (gastroesophageal reflux disease)    doesn't take any meds  . Heart murmur    no symptoms  . History of bronchitis winter of 2014  . History of colon polyps    benign  . History  of kidney stones   . Hyperlipidemia   . Hypertension    takes Hyzaar daily  . Vitamin D deficiency disease 08/14/2011   takes Vit D daily     Review of Systems  Constitutional: Negative for chills and fever.  Musculoskeletal: Positive for back pain.       Positive for knot of left thumb.  Neurological: Negative for weakness and numbness.      Objective:    BP 116/74 (BP Location: Left Arm, Patient Position: Sitting, Cuff Size: Large)   Pulse 60   Temp 97.7 F (36.5 C) (Oral)   Resp 18   Ht 5\' 4"  (1.626 m)   Wt 275 lb (124.7 kg)   SpO2 97%   BMI 47.20 kg/m  Nursing note and vital signs reviewed.  Physical Exam  Constitutional: She is oriented to person, place, and time. She appears well-developed and well-nourished. No distress.  Cardiovascular: Normal rate, regular rhythm, normal heart sounds and intact distal pulses.   Pulmonary/Chest: Effort normal and breath sounds normal.  Musculoskeletal:  Left thumb - no obvious deformity, discoloration, or edema. Palpable tenderness and small nodule noted in the flexor tendon. Nodules mobile. Range of motion is within normal limits. Strength is normal. Capillary refill and pulses are intact and appropriate.  Low back - no obvious deformity, discoloration, or edema. She has excessive lordotic curve noted. She is of gynoid. There is mild tenderness of the paraspinal musculature with no deformities or muscle noted. Range of motion is within normal limits with no discomfort. Straight leg raise is negative. Faber's test is negative. Distal pulses and sensation are intact and appropriate.  Neurological: She is alert and oriented to person, place, and time.  Skin: Skin is warm and dry.  Psychiatric: She has a normal mood and affect. Her behavior is normal. Judgment and thought content normal.   Procedure: Tendon Sheath Injection of Left Thumb  Description: Informed consent was obtained with discussion including risks and benefits of the  procedure. Patient verbally wished to continued. Time out was performed. The left thumb was marked and identified. It was cleansed with betadine using a concentric circular pattern. Skin anesthesia was applied using cold spray applied for 10 seconds. Injection of 0.5:0.5 of Kenalog to Sensoricaine was injected following aspiration with no blood return noted. Following the injection there wasrelief confirming appropriate placement. A bandage was applied to the area and post care instructions were provided. The procedure was tolerated well with no complications.      Assessment & Plan:   Problem List Items Addressed This Visit      Other   Nodule of flexor tendon sheath    Symptoms and exam consistent with nodule  flexor tendon sheath of the left thumb. Cortisone injection provided with improvement in symptoms. Encouraged to monitor for signs of infection and follow-up if symptoms worsen or do not improve.      Midline low back pain without sciatica - Primary    Symptoms and exam consistent with muscular imbalance most likely related to obesity. Recommend conservative treatment with over-the-counter anti-inflammatories as needed for discomfort, ice/heat, and home exercise therapy. Encouraged weight loss. Follow-up in 3 weeks or sooner if symptoms worsen or do not improve.       Other Visit Diagnoses   None.      I am having Ms. Dible maintain her Vitamin D3, multivitamin, Biotin, calcium carbonate, FLUOCINOLONE ACETONIDE SCALP, fluticasone, loratadine, sennosides-docusate sodium, pravastatin, and losartan-hydrochlorothiazide.   Follow-up: Return in about 3 weeks (around 02/13/2016), or if symptoms worsen or fail to improve.  Mauricio Po, FNP

## 2016-01-23 NOTE — Patient Instructions (Signed)
Thank you for choosing [facility].  Summary/Instructions:  Your thumb may be sore in the next 24 hours. Tylenol as needed. Ice as needed.   Monitor for signs of infection including redness, swelling, and discharge.   For your low back -  Ice / heat for 20 minutes every 2 hours as needed.   Ibuprofen or Aleve as needed for discomfort.  Stretching daily.  Work on weight loss.   If your symptoms worsen or fail to improve, please contact our office for further instruction, or in case of emergency go directly to the emergency room at the closest medical facility.    Low Back Strain With Rehab A strain is an injury in which a tendon or muscle is torn. The muscles and tendons of the lower back are vulnerable to strains. However, these muscles and tendons are very strong and require a great force to be injured. Strains are classified into three categories. Grade 1 strains cause pain, but the tendon is not lengthened. Grade 2 strains include a lengthened ligament, due to the ligament being stretched or partially ruptured. With grade 2 strains there is still function, although the function may be decreased. Grade 3 strains involve a complete tear of the tendon or muscle, and function is usually impaired. SYMPTOMS   Pain in the lower back.  Pain that affects one side more than the other.  Pain that gets worse with movement and may be felt in the hip, buttocks, or back of the thigh.  Muscle spasms of the muscles in the back.  Swelling along the muscles of the back.  Loss of strength of the back muscles.  Crackling sound (crepitation) when the muscles are touched. CAUSES  Lower back strains occur when a force is placed on the muscles or tendons that is greater than they can handle. Common causes of injury include:  Prolonged overuse of the muscle-tendon units in the lower back, usually from incorrect posture.  A single violent injury or force applied to the back. RISK INCREASES  WITH:  Sports that involve twisting forces on the spine or a lot of bending at the waist (football, rugby, weightlifting, bowling, golf, tennis, speed skating, racquetball, swimming, running, gymnastics, diving).  Poor strength and flexibility.  Failure to warm up properly before activity.  Family history of lower back pain or disk disorders.  Previous back injury or surgery (especially fusion).  Poor posture with lifting, especially heavy objects.  Prolonged sitting, especially with poor posture. PREVENTION   Learn and use proper posture when sitting or lifting (maintain proper posture when sitting, lift using the knees and legs, not at the waist).  Warm up and stretch properly before activity.  Allow for adequate recovery between workouts.  Maintain physical fitness:  Strength, flexibility, and endurance.  Cardiovascular fitness. PROGNOSIS  If treated properly, lower back strains usually heal within 6 weeks. RELATED COMPLICATIONS   Recurring symptoms, resulting in a chronic problem.  Chronic inflammation, scarring, and partial muscle-tendon tear.  Delayed healing or resolution of symptoms.  Prolonged disability. TREATMENT  Treatment first involves the use of ice and medicine, to reduce pain and inflammation. The use of strengthening and stretching exercises may help reduce pain with activity. These exercises may be performed at home or with a therapist. Severe injuries may require referral to a therapist for further evaluation and treatment, such as ultrasound. Your caregiver may advise that you wear a back brace or corset, to help reduce pain and discomfort. Often, prolonged bed rest results  in greater harm then benefit. Corticosteroid injections may be recommended. However, these should be reserved for the most serious cases. It is important to avoid using your back when lifting objects. At night, sleep on your back on a firm mattress with a pillow placed under your knees.  If non-surgical treatment is unsuccessful, surgery may be needed.  MEDICATION   If pain medicine is needed, nonsteroidal anti-inflammatory medicines (aspirin and ibuprofen), or other minor pain relievers (acetaminophen), are often advised.  Do not take pain medicine for 7 days before surgery.  Prescription pain relievers may be given, if your caregiver thinks they are needed. Use only as directed and only as much as you need.  Ointments applied to the skin may be helpful.  Corticosteroid injections may be given by your caregiver. These injections should be reserved for the most serious cases, because they may only be given a certain number of times. HEAT AND COLD  Cold treatment (icing) should be applied for 10 to 15 minutes every 2 to 3 hours for inflammation and pain, and immediately after activity that aggravates your symptoms. Use ice packs or an ice massage.  Heat treatment may be used before performing stretching and strengthening activities prescribed by your caregiver, physical therapist, or athletic trainer. Use a heat pack or a warm water soak. SEEK MEDICAL CARE IF:   Symptoms get worse or do not improve in 2 to 4 weeks, despite treatment.  You develop numbness, weakness, or loss of bowel or bladder function.  New, unexplained symptoms develop. (Drugs used in treatment may produce side effects.) EXERCISES  RANGE OF MOTION (ROM) AND STRETCHING EXERCISES - Low Back Strain Most people with lower back pain will find that their symptoms get worse with excessive bending forward (flexion) or arching at the lower back (extension). The exercises which will help resolve your symptoms will focus on the opposite motion.  Your physician, physical therapist or athletic trainer will help you determine which exercises will be most helpful to resolve your lower back pain. Do not complete any exercises without first consulting with your caregiver. Discontinue any exercises which make your symptoms  worse until you speak to your caregiver.  If you have pain, numbness or tingling which travels down into your buttocks, leg or foot, the goal of the therapy is for these symptoms to move closer to your back and eventually resolve. Sometimes, these leg symptoms will get better, but your lower back pain may worsen. This is typically an indication of progress in your rehabilitation. Be very alert to any changes in your symptoms and the activities in which you participated in the 24 hours prior to the change. Sharing this information with your caregiver will allow him/her to most efficiently treat your condition.  These exercises may help you when beginning to rehabilitate your injury. Your symptoms may resolve with or without further involvement from your physician, physical therapist or athletic trainer. While completing these exercises, remember:  Restoring tissue flexibility helps normal motion to return to the joints. This allows healthier, less painful movement and activity.  An effective stretch should be held for at least 30 seconds.  A stretch should never be painful. You should only feel a gentle lengthening or release in the stretched tissue. FLEXION RANGE OF MOTION AND STRETCHING EXERCISES: STRETCH - Flexion, Single Knee to Chest   Lie on a firm bed or floor with both legs extended in front of you.  Keeping one leg in contact with the floor, bring your opposite  knee to your chest. Hold your leg in place by either grabbing behind your thigh or at your knee.  Pull until you feel a gentle stretch in your lower back. Hold __________ seconds.  Slowly release your grasp and repeat the exercise with the opposite side. Repeat __________ times. Complete this exercise __________ times per day.  STRETCH - Flexion, Double Knee to Chest   Lie on a firm bed or floor with both legs extended in front of you.  Keeping one leg in contact with the floor, bring your opposite knee to your chest.  Tense  your stomach muscles to support your back and then lift your other knee to your chest. Hold your legs in place by either grabbing behind your thighs or at your knees.  Pull both knees toward your chest until you feel a gentle stretch in your lower back. Hold __________ seconds.  Tense your stomach muscles and slowly return one leg at a time to the floor. Repeat __________ times. Complete this exercise __________ times per day.  STRETCH - Low Trunk Rotation  Lie on a firm bed or floor. Keeping your legs in front of you, bend your knees so they are both pointed toward the ceiling and your feet are flat on the floor.  Extend your arms out to the side. This will stabilize your upper body by keeping your shoulders in contact with the floor.  Gently and slowly drop both knees together to one side until you feel a gentle stretch in your lower back. Hold for __________ seconds.  Tense your stomach muscles to support your lower back as you bring your knees back to the starting position. Repeat the exercise to the other side. Repeat __________ times. Complete this exercise __________ times per day  EXTENSION RANGE OF MOTION AND FLEXIBILITY EXERCISES: STRETCH - Extension, Prone on Elbows   Lie on your stomach on the floor, a bed will be too soft. Place your palms about shoulder width apart and at the height of your head.  Place your elbows under your shoulders. If this is too painful, stack pillows under your chest.  Allow your body to relax so that your hips drop lower and make contact more completely with the floor.  Hold this position for __________ seconds.  Slowly return to lying flat on the floor. Repeat __________ times. Complete this exercise __________ times per day.  RANGE OF MOTION - Extension, Prone Press Ups  Lie on your stomach on the floor, a bed will be too soft. Place your palms about shoulder width apart and at the height of your head.  Keeping your back as relaxed as  possible, slowly straighten your elbows while keeping your hips on the floor. You may adjust the placement of your hands to maximize your comfort. As you gain motion, your hands will come more underneath your shoulders.  Hold this position __________ seconds.  Slowly return to lying flat on the floor. Repeat __________ times. Complete this exercise __________ times per day.  RANGE OF MOTION- Quadruped, Neutral Spine   Assume a hands and knees position on a firm surface. Keep your hands under your shoulders and your knees under your hips. You may place padding under your knees for comfort.  Drop your head and point your tail bone toward the ground below you. This will round out your lower back like an angry cat. Hold this position for __________ seconds.  Slowly lift your head and release your tail bone so that your back  sags into a large arch, like an old horse.  Hold this position for __________ seconds.  Repeat this until you feel limber in your lower back.  Now, find your "sweet spot." This will be the most comfortable position somewhere between the two previous positions. This is your neutral spine. Once you have found this position, tense your stomach muscles to support your lower back.  Hold this position for __________ seconds. Repeat __________ times. Complete this exercise __________ times per day.  STRENGTHENING EXERCISES - Low Back Strain These exercises may help you when beginning to rehabilitate your injury. These exercises should be done near your "sweet spot." This is the neutral, low-back arch, somewhere between fully rounded and fully arched, that is your least painful position. When performed in this safe range of motion, these exercises can be used for people who have either a flexion or extension based injury. These exercises may resolve your symptoms with or without further involvement from your physician, physical therapist or athletic trainer. While completing these  exercises, remember:   Muscles can gain both the endurance and the strength needed for everyday activities through controlled exercises.  Complete these exercises as instructed by your physician, physical therapist or athletic trainer. Increase the resistance and repetitions only as guided.  You may experience muscle soreness or fatigue, but the pain or discomfort you are trying to eliminate should never worsen during these exercises. If this pain does worsen, stop and make certain you are following the directions exactly. If the pain is still present after adjustments, discontinue the exercise until you can discuss the trouble with your caregiver. STRENGTHENING - Deep Abdominals, Pelvic Tilt  Lie on a firm bed or floor. Keeping your legs in front of you, bend your knees so they are both pointed toward the ceiling and your feet are flat on the floor.  Tense your lower abdominal muscles to press your lower back into the floor. This motion will rotate your pelvis so that your tail bone is scooping upwards rather than pointing at your feet or into the floor.  With a gentle tension and even breathing, hold this position for __________ seconds. Repeat __________ times. Complete this exercise __________ times per day.  STRENGTHENING - Abdominals, Crunches   Lie on a firm bed or floor. Keeping your legs in front of you, bend your knees so they are both pointed toward the ceiling and your feet are flat on the floor. Cross your arms over your chest.  Slightly tip your chin down without bending your neck.  Tense your abdominals and slowly lift your trunk high enough to just clear your shoulder blades. Lifting higher can put excessive stress on the lower back and does not further strengthen your abdominal muscles.  Control your return to the starting position. Repeat __________ times. Complete this exercise __________ times per day.  STRENGTHENING - Quadruped, Opposite UE/LE Lift   Assume a hands and  knees position on a firm surface. Keep your hands under your shoulders and your knees under your hips. You may place padding under your knees for comfort.  Find your neutral spine and gently tense your abdominal muscles so that you can maintain this position. Your shoulders and hips should form a rectangle that is parallel with the floor and is not twisted.  Keeping your trunk steady, lift your right hand no higher than your shoulder and then your left leg no higher than your hip. Make sure you are not holding your breath. Hold this position  __________ seconds.  Continuing to keep your abdominal muscles tense and your back steady, slowly return to your starting position. Repeat with the opposite arm and leg. Repeat __________ times. Complete this exercise __________ times per day.  STRENGTHENING - Lower Abdominals, Double Knee Lift  Lie on a firm bed or floor. Keeping your legs in front of you, bend your knees so they are both pointed toward the ceiling and your feet are flat on the floor.  Tense your abdominal muscles to brace your lower back and slowly lift both of your knees until they come over your hips. Be certain not to hold your breath.  Hold __________ seconds. Using your abdominal muscles, return to the starting position in a slow and controlled manner. Repeat __________ times. Complete this exercise __________ times per day.  POSTURE AND BODY MECHANICS CONSIDERATIONS - Low Back Strain Keeping correct posture when sitting, standing or completing your activities will reduce the stress put on different body tissues, allowing injured tissues a chance to heal and limiting painful experiences. The following are general guidelines for improved posture. Your physician or physical therapist will provide you with any instructions specific to your needs. While reading these guidelines, remember:  The exercises prescribed by your provider will help you have the flexibility and strength to maintain  correct postures.  The correct posture provides the best environment for your joints to work. All of your joints have less wear and tear when properly supported by a spine with good posture. This means you will experience a healthier, less painful body.  Correct posture must be practiced with all of your activities, especially prolonged sitting and standing. Correct posture is as important when doing repetitive low-stress activities (typing) as it is when doing a single heavy-load activity (lifting). RESTING POSITIONS Consider which positions are most painful for you when choosing a resting position. If you have pain with flexion-based activities (sitting, bending, stooping, squatting), choose a position that allows you to rest in a less flexed posture. You would want to avoid curling into a fetal position on your side. If your pain worsens with extension-based activities (prolonged standing, working overhead), avoid resting in an extended position such as sleeping on your stomach. Most people will find more comfort when they rest with their spine in a more neutral position, neither too rounded nor too arched. Lying on a non-sagging bed on your side with a pillow between your knees, or on your back with a pillow under your knees will often provide some relief. Keep in mind, being in any one position for a prolonged period of time, no matter how correct your posture, can still lead to stiffness. PROPER SITTING POSTURE In order to minimize stress and discomfort on your spine, you must sit with correct posture. Sitting with good posture should be effortless for a healthy body. Returning to good posture is a gradual process. Many people can work toward this most comfortably by using various supports until they have the flexibility and strength to maintain this posture on their own. When sitting with proper posture, your ears will fall over your shoulders and your shoulders will fall over your hips. You should  use the back of the chair to support your upper back. Your lower back will be in a neutral position, just slightly arched. You may place a small pillow or folded towel at the base of your lower back for support.  When working at a desk, create an environment that supports good, upright posture. Without extra  support, muscles tire, which leads to excessive strain on joints and other tissues. Keep these recommendations in mind: CHAIR:  A chair should be able to slide under your desk when your back makes contact with the back of the chair. This allows you to work closely.  The chair's height should allow your eyes to be level with the upper part of your monitor and your hands to be slightly lower than your elbows. BODY POSITION  Your feet should make contact with the floor. If this is not possible, use a foot rest.  Keep your ears over your shoulders. This will reduce stress on your neck and lower back. INCORRECT SITTING POSTURES  If you are feeling tired and unable to assume a healthy sitting posture, do not slouch or slump. This puts excessive strain on your back tissues, causing more damage and pain. Healthier options include:  Using more support, like a lumbar pillow.  Switching tasks to something that requires you to be upright or walking.  Talking a brief walk.  Lying down to rest in a neutral-spine position. PROLONGED STANDING WHILE SLIGHTLY LEANING FORWARD  When completing a task that requires you to lean forward while standing in one place for a long time, place either foot up on a stationary 2-4 inch high object to help maintain the best posture. When both feet are on the ground, the lower back tends to lose its slight inward curve. If this curve flattens (or becomes too large), then the back and your other joints will experience too much stress, tire more quickly, and can cause pain. CORRECT STANDING POSTURES Proper standing posture should be assumed with all daily activities, even  if they only take a few moments, like when brushing your teeth. As in sitting, your ears should fall over your shoulders and your shoulders should fall over your hips. You should keep a slight tension in your abdominal muscles to brace your spine. Your tailbone should point down to the ground, not behind your body, resulting in an over-extended swayback posture.  INCORRECT STANDING POSTURES  Common incorrect standing postures include a forward head, locked knees and/or an excessive swayback. WALKING Walk with an upright posture. Your ears, shoulders and hips should all line-up. PROLONGED ACTIVITY IN A FLEXED POSITION When completing a task that requires you to bend forward at your waist or lean over a low surface, try to find a way to stabilize 3 out of 4 of your limbs. You can place a hand or elbow on your thigh or rest a knee on the surface you are reaching across. This will provide you more stability so that your muscles do not fatigue as quickly. By keeping your knees relaxed, or slightly bent, you will also reduce stress across your lower back. CORRECT LIFTING TECHNIQUES DO :   Assume a wide stance. This will provide you more stability and the opportunity to get as close as possible to the object which you are lifting.  Tense your abdominals to brace your spine. Bend at the knees and hips. Keeping your back locked in a neutral-spine position, lift using your leg muscles. Lift with your legs, keeping your back straight.  Test the weight of unknown objects before attempting to lift them.  Try to keep your elbows locked down at your sides in order get the best strength from your shoulders when carrying an object.  Always ask for help when lifting heavy or awkward objects. INCORRECT LIFTING TECHNIQUES DO NOT:   Lock your knees when  lifting, even if it is a small object.  Bend and twist. Pivot at your feet or move your feet when needing to change directions.  Assume that you can safely pick  up even a paper clip without proper posture.   This information is not intended to replace advice given to you by your health care provider. Make sure you discuss any questions you have with your health care provider.   Document Released: 05/27/2005 Document Revised: 06/17/2014 Document Reviewed: 09/08/2008 Elsevier Interactive Patient Education [employer].

## 2016-01-23 NOTE — Assessment & Plan Note (Signed)
Symptoms and exam consistent with muscular imbalance most likely related to obesity. Recommend conservative treatment with over-the-counter anti-inflammatories as needed for discomfort, ice/heat, and home exercise therapy. Encouraged weight loss. Follow-up in 3 weeks or sooner if symptoms worsen or do not improve.

## 2016-01-23 NOTE — Assessment & Plan Note (Signed)
Symptoms and exam consistent with nodule flexor tendon sheath of the left thumb. Cortisone injection provided with improvement in symptoms. Encouraged to monitor for signs of infection and follow-up if symptoms worsen or do not improve.

## 2016-02-26 ENCOUNTER — Encounter: Payer: Self-pay | Admitting: Student

## 2016-03-21 DIAGNOSIS — L668 Other cicatricial alopecia: Secondary | ICD-10-CM | POA: Diagnosis not present

## 2016-04-18 ENCOUNTER — Other Ambulatory Visit: Payer: Self-pay | Admitting: Internal Medicine

## 2016-04-24 ENCOUNTER — Other Ambulatory Visit: Payer: Self-pay | Admitting: Internal Medicine

## 2016-05-06 ENCOUNTER — Other Ambulatory Visit: Payer: Self-pay | Admitting: Family

## 2016-05-19 DIAGNOSIS — R7303 Prediabetes: Secondary | ICD-10-CM | POA: Insufficient documentation

## 2016-05-19 NOTE — Progress Notes (Signed)
Subjective:    Patient ID: Frances Gray, female    DOB: 16-Jul-1950, 65 y.o.   MRN: [id number]  HPI She is here to establish with a new pcp.   Here for medicare wellness exam and chronic medical conditions.   Prediabetes:  She is more compliant with a low sugar/carbohydrate diet, but can do better.  She is not exercising regularly.  She knows she needs to lose weight.  Hypertension: She is taking her medication daily. She is compliant with a low sodium diet.  She denies chest pain, palpitations, edema, shortness of breath and regular headaches. She is not exercising regularly.  She does not monitor her blood pressure at home.    Hyperlipidemia: She is taking her medication daily. She is compliant with a low fat/cholesterol diet. She is not exercising regularly.        I have personally reviewed and have noted 1.The patient's medical and social history 2.Their use of alcohol, tobacco or illicit drugs 3.Their current medications and supplements 4.The patient's functional ability including ADL's, fall risks, home safety risks and hearing or visual impairment. 5.Diet and physical activities 6.Evidence for depression or mood disorders 7.Care team reviewed  - Gyn - Dr Dellis Filbert, ortho - Dr Mardelle Matte, [facility] - Dr Elvera Lennox    Are there smokers in your home (other than you)? No  Risk Factors Exercise:  none Dietary issues discussed: trying to limit starches, well balanced - lots of fruits and veges  Cardiac risk factors: advanced age, hypertension, hyperlipidemia, and obesity (BMI >= 30 kg/m2).  Depression Screen  Have you felt down, depressed or hopeless? No  Have you felt little interest or pleasure in doing things?  No  Activities of Daily Living In your present state of health, do you have any difficulty performing the following activities?:  Driving? No Managing money?  No Feeding yourself? No Getting from bed to  chair? No Climbing a flight of stairs? No Preparing food and eating?: No Bathing or showering? No Getting dressed: No Getting to/using the toilet? No Moving around from place to place: No In the past year have you fallen or had a near fall?: No   Are you sexually active?  No  Do you have more than one partner?  N/A  Hearing Difficulties: No Do you often ask people to speak up or repeat themselves? No Do you experience ringing or noises in your ears? No Do you have difficulty understanding soft or whispered voices? No Vision:              Any change in vision:  No              Up to date with eye exam: yes  Memory:  Do you feel that you have a problem with memory? No  Do you often misplace items? No  Do you feel safe at home?  Yes  Cognitive Testing  Alert, Orientated? Yes  Normal Appearance? Yes  Recall of three objects?  Yes  Can perform simple calculations? Yes  Displays appropriate judgment? Yes  Can read the correct time from a watch face? Yes   Advanced Directives have been discussed with the patient? Yes - not in place     Medications and allergies reviewed with patient and updated if appropriate.  Patient Active Problem List   Diagnosis Date Noted  . Prediabetes 05/19/2016  . Nodule of flexor tendon sheath 01/23/2016  . Midline low back pain without sciatica 01/23/2016  . Knee osteoarthritis 12/06/2014  .  Hyperlipidemia   . Osteopenia 12/29/2013  . Severe obesity (BMI >= 40) ([place]) 11/16/2012  . 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 Anemia, unspecified 08/14/2011  . Vitamin D deficiency disease 08/14/2011  . Kidney stone 07/03/2011  . GERD (gastroesophageal reflux disease)   . Hypertension     Current Outpatient Prescriptions on File Prior to Visit  Medication Sig Dispense Refill  . calcium carbonate (CALCIUM 600) 600 MG TABS tablet Take 1 tablet (600 mg total) by mouth 2 (two) times daily with a meal. 60 tablet 6  . Cholecalciferol (VITAMIN D3)  2000 UNITS TABS Take 2,000 Units by mouth every morning.     Marland Kitchen FLUOCINOLONE ACETONIDE SCALP 0.01 % OIL Apply 1 mL topically daily as needed (itchy scalp).   5  . fluticasone (FLONASE) 50 MCG/ACT nasal spray Place 1 spray into both nostrils daily as needed for allergies or rhinitis.   0  . loratadine (CLARITIN) 10 MG tablet Take 10 mg by mouth daily as needed for allergies.    Marland Kitchen losartan-hydrochlorothiazide (HYZAAR) 100-25 MG tablet TAKE 1 TABLET BY MOUTH DAILY. 90 tablet 0  . Multiple Vitamin (MULTIVITAMIN) tablet Take 1 tablet by mouth every morning.     . pravastatin (PRAVACHOL) 20 MG tablet Take 1 tablet (20 mg total) by mouth daily. Yearly physical w/labs are due in Dec must see MD for refills 30 tablet 0  . sennosides-docusate sodium (SENOKOT-S) 8.6-50 MG tablet Take 2 tablets by mouth daily. 30 tablet 1   No current facility-administered medications on file prior to visit.     Past Medical History:  Diagnosis Date  . Allergic rhinitis, seasonal    takes Claritin daily as needed and uses Flonase if needed  . 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 Diverticulitis   . GERD (gastroesophageal reflux disease)    doesn't take any meds  . Heart murmur    no symptoms  . History of bronchitis winter of 2014  . History of colon polyps    benign  . History of kidney stones   . Hyperlipidemia   . Hypertension    takes Hyzaar daily  . Vitamin D deficiency disease 08/14/2011   takes Vit D daily    Past Surgical History:  Procedure Laterality Date  . BREAST BIOPSY Left   . Breast ultrasound Right 03/02/13   The 68mm complex cyst in the right breast is probably benign. Follow-up in 6 months with bilateral mammogram and (R) ultrasound  . Breast Ultrasound Right 09/02/13   There is a benign 3 mm complex cyst in the (R) breast at 9 o'clock  . COLONOSCOPY    . ESOPHAGUS SURGERY  1996   tumor removed   (benign)  . KNEE ARTHROSCOPY     bilateral  . LITHOTRIPSY  08/2011   R  ureteral stone  . TOTAL KNEE ARTHROPLASTY Left 11/16/2012   Procedure: TOTAL KNEE ARTHROPLASTY- left;  Surgeon: Johnny Bridge, MD;  Location: [facility];  Service: Orthopedics;  Laterality: Left;  . TOTAL KNEE ARTHROPLASTY Right 12/06/2014   Procedure: TOTAL KNEE ARTHROPLASTY;  Surgeon: Marchia Bond, MD;  Location: [facility];  Service: Orthopedics;  Laterality: Right;    Social History   Social History  . Marital status: Married    Spouse name: N/A  . Number of children: N/A  . Years of education: N/A   Social History Main Topics  . Smoking status: Former Smoker    Packs/day: 0.25  Years: 10.00  . Smokeless tobacco: Never Used     Comment: quit smoking 51yrs ago  . Alcohol use No  . Drug use: No  . Sexual activity: Yes    Birth control/ protection: Post-menopausal   Other Topics Concern  . Not on file   Social History Narrative   Lives with spouse and g-son, temporarily mother in law (05/2014)    Family History  Problem Relation Age of Onset  . Ovarian cancer Mother   . Colon cancer Sister 38  . Breast cancer Sister   . Esophageal cancer Neg Hx     Review of Systems  Constitutional: Negative for chills and fever.  HENT: Negative for hearing loss and tinnitus.   Eyes: Negative for visual disturbance.  Respiratory: Negative for cough, shortness of breath and wheezing.   Cardiovascular: Negative for chest pain, palpitations and leg swelling.  Gastrointestinal: Negative for abdominal pain, blood in stool, constipation, diarrhea and nausea.       No gerd  Genitourinary: Negative for dysuria and hematuria.  Musculoskeletal: Positive for arthralgias (R>L knee) and back pain (lower back, intermittent - not related to specific activity).  Skin: Negative for color change and rash.  Neurological: Negative for dizziness, light-headedness and headaches.  Psychiatric/Behavioral: Negative for dysphoric mood. The patient is not nervous/anxious.        Objective:   Vitals:    05/20/16 1001  BP: 140/88  Pulse: (!) 57  Resp: 18  Temp: 97.8 F (36.6 C)   Filed Weights   05/20/16 1001  Weight: 281 lb (127.5 kg)   Body mass index is 48.23 kg/m.   Physical Exam    Constitutional: She appears well-developed and well-nourished. No distress.  HENT:  Head: Normocephalic and atraumatic.  Right Ear: External ear normal. Normal ear canal and TM Left Ear: External ear normal.  Normal ear canal and TM Mouth/Throat: Oropharynx is clear and moist.  Eyes: Conjunctivae and EOM are normal.  Neck: Neck supple. No tracheal deviation present.  thyromegaly present.  No carotid bruit  Cardiovascular: Normal rate, regular rhythm and normal heart sounds.   No murmur heard.  No edema. Pulmonary/Chest: Effort normal and breath sounds normal. No respiratory distress. She has no wheezes. She has no rales.  Abdominal: Soft. She exhibits no distension. There is no tenderness.  Lymphadenopathy: She has no cervical adenopathy.  Skin: Skin is warm and dry. She is not diaphoretic.  Psychiatric: She has a normal mood and affect. Her behavior is normal.       Assessment & Plan:   Wellness Exam: Immunizations  Flu vaccine today ,  Pneumovax discussed - will not give today since she had the flu vaccine Colonoscopy  Up to date  Mammogram  Up to date  Dexa  -has osteopenia - dexa due, will order Gyn    Up to date  Eye exam  Up to date  Hearing loss    none Memory concerns/difficulties  none Independent of ADLs -  fully Stressed the importance of regular exercise   Patient received copy of preventative screening tests/immunizations recommended for the next 5-10 years.  See Problem List for Assessment and Plan of chronic medical problems.   F/u in 6 months

## 2016-05-19 NOTE — Patient Instructions (Addendum)
  Frances Gray , Thank you for taking time to come for your Medicare Wellness Visit. I appreciate your ongoing commitment to your health goals. Please review the following plan we discussed and let me know if I can assist you in the future.   These are the goals we discussed: Goals    Work on weight loss      This is a list of the screening recommended for you and due dates:  Health Maintenance  Topic Date Due  . Mammogram  09/03/2015  . DEXA scan (bone density measurement)  12/23/2015  . Flu Shot  01/09/2016  . Pneumonia vaccines (2 of 2 - PPSV23) 05/14/2016  . Colon Cancer Screening  10/21/2018  . Tetanus Vaccine  10/21/2020  . Shingles Vaccine  Addressed  .  Hepatitis C: One time screening is recommended by Center for Disease Control  (CDC) for  adults born from [birth year] through 1966.   Completed     Test(s) ordered today. Your results will be released to [facility] (or called to you) after review, usually within 72hours after test completion. If any changes need to be made, you will be notified at that same time.  All other Health Maintenance issues reviewed.   All recommended immunizations and age-appropriate screenings are up-to-date or discussed.   Flu immunization administered today.   Medications reviewed and updated.  No changes recommended at this time.  Your prescription(s) have been submitted to your pharmacy. Please take as directed and contact our office if you believe you are having problem(s) with the medication(s).  A bone density scan and thyroid ultrasound was ordered  Please followup in 6 months

## 2016-05-19 NOTE — Assessment & Plan Note (Signed)
Check lipid panel  Continue daily statin Regular exercise and healthy diet encouraged  

## 2016-05-20 ENCOUNTER — Encounter: Payer: Self-pay | Admitting: Internal Medicine

## 2016-05-20 ENCOUNTER — Ambulatory Visit (INDEPENDENT_AMBULATORY_CARE_PROVIDER_SITE_OTHER): Payer: Medicare Other | Admitting: Internal Medicine

## 2016-05-20 ENCOUNTER — Other Ambulatory Visit (INDEPENDENT_AMBULATORY_CARE_PROVIDER_SITE_OTHER): Payer: Medicare Other

## 2016-05-20 VITALS — BP 140/88 | HR 57 | Temp 97.8°F | Resp 18 | Ht 64.0 in | Wt 281.0 lb

## 2016-05-20 DIAGNOSIS — Z13820 Encounter for screening for osteoporosis: Secondary | ICD-10-CM

## 2016-05-20 DIAGNOSIS — M8580 Other specified disorders of bone density and structure, unspecified site: Secondary | ICD-10-CM

## 2016-05-20 DIAGNOSIS — E559 Vitamin D deficiency, unspecified: Secondary | ICD-10-CM | POA: Diagnosis not present

## 2016-05-20 DIAGNOSIS — E7800 Pure hypercholesterolemia, unspecified: Secondary | ICD-10-CM | POA: Diagnosis not present

## 2016-05-20 DIAGNOSIS — E042 Nontoxic multinodular goiter: Secondary | ICD-10-CM

## 2016-05-20 DIAGNOSIS — Z0000 Encounter for general adult medical examination without abnormal findings: Secondary | ICD-10-CM | POA: Diagnosis not present

## 2016-05-20 DIAGNOSIS — R7303 Prediabetes: Secondary | ICD-10-CM

## 2016-05-20 DIAGNOSIS — I10 Essential (primary) hypertension: Secondary | ICD-10-CM | POA: Diagnosis not present

## 2016-05-20 DIAGNOSIS — Z780 Asymptomatic menopausal state: Secondary | ICD-10-CM

## 2016-05-20 DIAGNOSIS — Z23 Encounter for immunization: Secondary | ICD-10-CM | POA: Diagnosis not present

## 2016-05-20 LAB — HEMOGLOBIN A1C: HEMOGLOBIN A1C: 6.4 % (ref 4.6–6.5)

## 2016-05-20 LAB — LIPID PANEL
Cholesterol: 193 mg/dL (ref 0–200)
HDL: 62.1 mg/dL (ref >39.00)
LDL Cholesterol: 109 mg/dL — ABNORMAL HIGH (ref 0–99)
NonHDL: 131.26
TRIGLYCERIDES: 112 mg/dL (ref 0.0–149.0)
Total CHOL/HDL Ratio: 3
VLDL: 22.4 mg/dL (ref 0.0–40.0)

## 2016-05-20 LAB — CBC WITH DIFFERENTIAL/PLATELET
BASOS PCT: 0.7 % (ref 0.0–3.0)
Basophils Absolute: 0 10*3/uL (ref 0.0–0.1)
EOS PCT: 4.3 % (ref 0.0–5.0)
Eosinophils Absolute: 0.3 10*3/uL (ref 0.0–0.7)
HCT: 40.4 % (ref 36.0–46.0)
HEMOGLOBIN: 13.6 g/dL (ref 12.0–15.0)
LYMPHS ABS: 1.6 10*3/uL (ref 0.7–4.0)
Lymphocytes Relative: 25.3 % (ref 12.0–46.0)
MCHC: 33.6 g/dL (ref 30.0–36.0)
MCV: 83.7 fl (ref 78.0–100.0)
MONO ABS: 0.7 10*3/uL (ref 0.1–1.0)
Monocytes Relative: 11.3 % (ref 3.0–12.0)
NEUTROS PCT: 58.4 % (ref 43.0–77.0)
Neutro Abs: 3.7 10*3/uL (ref 1.4–7.7)
Platelets: 349 10*3/uL (ref 150.0–400.0)
RBC: 4.83 Mil/uL (ref 3.87–5.11)
RDW: 13.6 % (ref 11.5–15.5)
WBC: 6.4 10*3/uL (ref 4.0–10.5)

## 2016-05-20 LAB — COMPREHENSIVE METABOLIC PANEL
ALBUMIN: 4.1 g/dL (ref 3.5–5.2)
ALT: 18 U/L (ref 0–35)
AST: 18 U/L (ref 0–37)
Alkaline Phosphatase: 62 U/L (ref 39–117)
BUN: 15 mg/dL (ref 6–23)
CHLORIDE: 101 meq/L (ref 96–112)
CO2: 31 mEq/L (ref 19–32)
Calcium: 10.1 mg/dL (ref 8.4–10.5)
Creatinine, Ser: 0.71 mg/dL (ref 0.40–1.20)
GFR: 105.77 mL/min (ref >60.00)
Glucose, Bld: 102 mg/dL — ABNORMAL HIGH (ref 70–99)
POTASSIUM: 3.9 meq/L (ref 3.5–5.1)
SODIUM: 139 meq/L (ref 135–145)
Total Bilirubin: 0.5 mg/dL (ref 0.2–1.2)
Total Protein: 8.2 g/dL (ref 6.0–8.3)

## 2016-05-20 LAB — TSH: TSH: 2.96 u[IU]/mL (ref 0.35–4.50)

## 2016-05-20 LAB — VITAMIN D 25 HYDROXY (VIT D DEFICIENCY, FRACTURES): VITD: 35.78 ng/mL (ref 30.00–100.00)

## 2016-05-20 MED ORDER — LOSARTAN POTASSIUM-HCTZ 100-25 MG PO TABS: 1.0000 | ORAL_TABLET | Freq: Every day | ORAL | 1 refills | Status: DC

## 2016-05-20 NOTE — Progress Notes (Signed)
Pre visit review using our clinic review tool, if applicable. No additional management support is needed unless otherwise documented below in the visit note. 

## 2016-05-20 NOTE — Assessment & Plan Note (Signed)
BP Readings from Last 3 Encounters:  05/20/16 140/88  01/23/16 116/74  05/15/15 132/82   Overall controlled - high today Stressed exercise, weight loss, low sodium diet Monitor BP at home  cmp

## 2016-05-20 NOTE — Assessment & Plan Note (Signed)
Taking D Check level

## 2016-05-20 NOTE — Assessment & Plan Note (Signed)
Last [place] 2016 Thyroid US ordered

## 2016-05-20 NOTE — Assessment & Plan Note (Signed)
Check a1c Start exercise Work on weight loss

## 2016-05-20 NOTE — Assessment & Plan Note (Signed)
dexa - due -- will order Taking cal/vitamin d Stressed regular exercise

## 2016-05-20 NOTE — Assessment & Plan Note (Signed)
Stressed exercise and weight loss Discussed nutrition referral, weight watchers

## 2016-05-27 ENCOUNTER — Ambulatory Visit
Admission: RE | Admit: 2016-05-27 | Discharge: 2016-05-27 | Disposition: A | Discharge status: Home / Self Care | Payer: Medicare Other | Source: Ambulatory Visit | Attending: Internal Medicine | Admitting: Internal Medicine

## 2016-05-27 ENCOUNTER — Encounter: Payer: Self-pay | Admitting: Internal Medicine

## 2016-05-27 DIAGNOSIS — E042 Nontoxic multinodular goiter: Secondary | ICD-10-CM | POA: Diagnosis not present

## 2016-05-29 DIAGNOSIS — Z96653 Presence of artificial knee joint, bilateral: Secondary | ICD-10-CM | POA: Diagnosis not present

## 2016-06-04 ENCOUNTER — Other Ambulatory Visit: Payer: Self-pay | Admitting: Emergency Medicine

## 2016-06-04 MED ORDER — PRAVASTATIN SODIUM 20 MG PO TABS: 20.0000 mg | ORAL_TABLET | Freq: Every day | ORAL | 3 refills | Status: DC

## 2016-06-24 ENCOUNTER — Ambulatory Visit (INDEPENDENT_AMBULATORY_CARE_PROVIDER_SITE_OTHER)
Admission: RE | Admit: 2016-06-24 | Discharge: 2016-06-24 | Disposition: A | Discharge status: Home / Self Care | Payer: Medicare Other | Source: Ambulatory Visit | Attending: Internal Medicine | Admitting: Internal Medicine

## 2016-06-24 DIAGNOSIS — M8580 Other specified disorders of bone density and structure, unspecified site: Secondary | ICD-10-CM

## 2016-06-24 DIAGNOSIS — Z780 Asymptomatic menopausal state: Secondary | ICD-10-CM | POA: Diagnosis not present

## 2016-06-24 DIAGNOSIS — Z13820 Encounter for screening for osteoporosis: Secondary | ICD-10-CM

## 2016-06-29 ENCOUNTER — Encounter: Payer: Self-pay | Admitting: Internal Medicine

## 2016-09-19 DIAGNOSIS — L308 Other specified dermatitis: Secondary | ICD-10-CM | POA: Diagnosis not present

## 2016-09-19 DIAGNOSIS — L668 Other cicatricial alopecia: Secondary | ICD-10-CM | POA: Diagnosis not present

## 2016-09-26 DIAGNOSIS — Z124 Encounter for screening for malignant neoplasm of cervix: Secondary | ICD-10-CM | POA: Diagnosis not present

## 2016-09-26 DIAGNOSIS — Z1231 Encounter for screening mammogram for malignant neoplasm of breast: Secondary | ICD-10-CM | POA: Diagnosis not present

## 2016-09-26 DIAGNOSIS — Z01419 Encounter for gynecological examination (general) (routine) without abnormal findings: Secondary | ICD-10-CM | POA: Diagnosis not present

## 2016-11-11 ENCOUNTER — Ambulatory Visit: Payer: Medicare Other | Admitting: Podiatry

## 2016-11-18 NOTE — Progress Notes (Signed)
Subjective:    Patient ID: Frances Gray, female    DOB: 1951-03-30, 66 y.o.   MRN: [id number]  HPI She is here for follow up.  Hypertension: She is taking her medication daily. She is compliant with a low sodium diet.  She denies chest pain, palpitations, edema, shortness of breath and regular headaches. She is not exercising regularly.  She does not monitor her blood pressure at home.    Hyperlipidemia: She is not taking her medication daily. She did have some muscle aches on it.  She is compliant with a low fat/cholesterol diet. She is not  exercising regularly. She denies myalgias off the medication.   Prediabetes:  She is compliant with a low sugar/carbohydrate diet.  She is not exercising regularly.  Obesity:  She is not exercising.  She is trying to eat healthy.    Medications and allergies reviewed with patient and updated if appropriate.  Patient Active Problem List   Diagnosis Date Noted  . Prediabetes 05/19/2016  . Nodule of flexor tendon sheath 01/23/2016  . Midline low back pain without sciatica 01/23/2016  . Knee osteoarthritis 12/06/2014  . Hyperlipidemia   . Osteopenia 12/29/2013  . Severe obesity (BMI >= 40) ([place]) 11/16/2012  . 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 Anemia, unspecified 08/14/2011  . Vitamin D deficiency disease 08/14/2011  . Kidney stone 07/03/2011  . GERD (gastroesophageal reflux disease)   . Hypertension     Current Outpatient Prescriptions on File Prior to Visit  Medication Sig Dispense Refill  . calcium carbonate (CALCIUM 600) 600 MG TABS tablet Take 1 tablet (600 mg total) by mouth 2 (two) times daily with a meal. 60 tablet 6  . Cholecalciferol (VITAMIN D3) 2000 UNITS TABS Take 2,000 Units by mouth every morning.     . fluticasone (FLONASE) 50 MCG/ACT nasal spray Place 1 spray into both nostrils daily as needed for allergies or rhinitis.   0  . losartan-hydrochlorothiazide (HYZAAR) 100-25 MG tablet Take 1 tablet by mouth daily.  90 tablet 1  . Multiple Vitamin (MULTIVITAMIN) tablet Take 1 tablet by mouth every morning.     . sennosides-docusate sodium (SENOKOT-S) 8.6-50 MG tablet Take 2 tablets by mouth daily. 30 tablet 1  . pravastatin (PRAVACHOL) 20 MG tablet Take 1 tablet (20 mg total) by mouth daily. (Patient not taking: Reported on 11/19/2016) 90 tablet 3   No current facility-administered medications on file prior to visit.     Past Medical History:  Diagnosis Date  . Allergic rhinitis, seasonal    takes Claritin daily as needed and uses Flonase if needed  . 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 Diverticulitis   . GERD (gastroesophageal reflux disease)    doesn't take any meds  . Heart murmur    no symptoms  . History of bronchitis winter of 2014  . History of colon polyps    benign  . History of kidney stones   . Hyperlipidemia   . Hypertension    takes Hyzaar daily  . Vitamin D deficiency disease 08/14/2011   takes Vit D daily    Past Surgical History:  Procedure Laterality Date  . BREAST BIOPSY Left   . Breast ultrasound Right 03/02/13   The 59mm complex cyst in the right breast is probably benign. Follow-up in 6 months with bilateral mammogram and (R) ultrasound  . Breast Ultrasound Right 09/02/13   There is a benign 3 mm complex cyst  in the (R) breast at 9 o'clock  . COLONOSCOPY    . ESOPHAGUS SURGERY  1996   tumor removed   (benign)  . KNEE ARTHROSCOPY     bilateral  . LITHOTRIPSY  08/2011   R ureteral stone  . TOTAL KNEE ARTHROPLASTY Left 11/16/2012   Procedure: TOTAL KNEE ARTHROPLASTY- left;  Surgeon: Johnny Bridge, MD;  Location: [facility];  Service: Orthopedics;  Laterality: Left;  . TOTAL KNEE ARTHROPLASTY Right 12/06/2014   Procedure: TOTAL KNEE ARTHROPLASTY;  Surgeon: Marchia Bond, MD;  Location: [facility];  Service: Orthopedics;  Laterality: Right;    Social History   Social History  . Marital status: Married    Spouse name: N/A  . Number of children: N/A  .  Years of education: N/A   Social History Main Topics  . Smoking status: Former Smoker    Packs/day: 0.25    Years: 10.00  . Smokeless tobacco: Never Used     Comment: quit smoking 95yrs ago  . Alcohol use No  . Drug use: No  . Sexual activity: Yes    Birth control/ protection: Post-menopausal   Other Topics Concern  . Not on file   Social History Narrative   Lives with spouse and g-son, temporarily mother in law (05/2014)    Family History  Problem Relation Age of Onset  . Ovarian cancer Mother   . Colon cancer Sister 5  . Breast cancer Sister   . Esophageal cancer Neg Hx     Review of Systems  Constitutional: Negative for chills and fever.  Respiratory: Negative for cough, shortness of breath and wheezing.   Cardiovascular: Negative for chest pain, palpitations and leg swelling.  Neurological: Positive for headaches (occasional - allergy). Negative for light-headedness.       Objective:   Vitals:   11/19/16 1041  BP: (!) 144/90  Pulse: 66  Resp: 18  Temp: 97.7 F (36.5 C)   Filed Weights   11/19/16 1041  Weight: 276 lb (125.2 kg)   Body mass index is 47.38 kg/m.  Wt Readings from Last 3 Encounters:  11/19/16 276 lb (125.2 kg)  05/20/16 281 lb (127.5 kg)  01/23/16 275 lb (124.7 kg)     Physical Exam Constitutional: Appears well-developed and well-nourished. No distress.  HENT:  Head: Normocephalic and atraumatic.  Neck: Neck supple. No tracheal deviation present. No thyromegaly present.  No cervical lymphadenopathy Cardiovascular: Normal rate, regular rhythm and normal heart sounds.   No murmur heard. No carotid bruit .  No edema Pulmonary/Chest: Effort normal and breath sounds normal. No respiratory distress. No has no wheezes. No rales.  Skin: Skin is warm and dry. Not diaphoretic.  Psychiatric: Normal mood and affect. Behavior is normal.        Assessment & Plan:    Pneumovax given today  See Problem List for Assessment and Plan of  chronic medical problems.

## 2016-11-18 NOTE — Patient Instructions (Addendum)
  Test(s) ordered today. Your results will be released to MyChart (or called to you) after review, usually within 72hours after test completion. If any changes need to be made, you will be notified at that same time.  All other Health Maintenance issues reviewed.   All recommended immunizations and age-appropriate screenings are up-to-date or discussed.  Pneumovax immunization administered today.   Medications reviewed and updated.  No changes recommended at this time.    Please followup in 6 months   

## 2016-11-19 ENCOUNTER — Other Ambulatory Visit (INDEPENDENT_AMBULATORY_CARE_PROVIDER_SITE_OTHER): Payer: Medicare Other

## 2016-11-19 ENCOUNTER — Encounter: Payer: Self-pay | Admitting: Internal Medicine

## 2016-11-19 ENCOUNTER — Ambulatory Visit (INDEPENDENT_AMBULATORY_CARE_PROVIDER_SITE_OTHER): Payer: Medicare Other | Admitting: Internal Medicine

## 2016-11-19 VITALS — BP 144/90 | HR 66 | Temp 97.7°F | Resp 18 | Wt 276.0 lb

## 2016-11-19 DIAGNOSIS — I10 Essential (primary) hypertension: Secondary | ICD-10-CM | POA: Diagnosis not present

## 2016-11-19 DIAGNOSIS — Z23 Encounter for immunization: Secondary | ICD-10-CM

## 2016-11-19 DIAGNOSIS — E7800 Pure hypercholesterolemia, unspecified: Secondary | ICD-10-CM

## 2016-11-19 DIAGNOSIS — R7303 Prediabetes: Secondary | ICD-10-CM

## 2016-11-19 LAB — HEMOGLOBIN A1C: Hgb A1c MFr Bld: 6.6 % — ABNORMAL HIGH (ref 4.6–6.5)

## 2016-11-19 LAB — LIPID PANEL
CHOL/HDL RATIO: 4
Cholesterol: 206 mg/dL — ABNORMAL HIGH (ref 0–200)
HDL: 51.4 mg/dL (ref >39.00)
LDL Cholesterol: 126 mg/dL — ABNORMAL HIGH (ref 0–99)
NONHDL: 154.4
TRIGLYCERIDES: 144 mg/dL (ref 0.0–149.0)
VLDL: 28.8 mg/dL (ref 0.0–40.0)

## 2016-11-19 LAB — COMPREHENSIVE METABOLIC PANEL
ALBUMIN: 3.8 g/dL (ref 3.5–5.2)
ALT: 15 U/L (ref 0–35)
AST: 16 U/L (ref 0–37)
Alkaline Phosphatase: 61 U/L (ref 39–117)
BILIRUBIN TOTAL: 0.5 mg/dL (ref 0.2–1.2)
BUN: 13 mg/dL (ref 6–23)
CALCIUM: 9.8 mg/dL (ref 8.4–10.5)
CO2: 32 meq/L (ref 19–32)
Chloride: 101 mEq/L (ref 96–112)
Creatinine, Ser: 0.7 mg/dL (ref 0.40–1.20)
GFR: 107.35 mL/min (ref >60.00)
Glucose, Bld: 103 mg/dL — ABNORMAL HIGH (ref 70–99)
Potassium: 3.7 mEq/L (ref 3.5–5.1)
Sodium: 138 mEq/L (ref 135–145)
Total Protein: 7.7 g/dL (ref 6.0–8.3)

## 2016-11-19 MED ORDER — ZOSTER VAC RECOMB ADJUVANTED 50 MCG/0.5ML IM SUSR: 0.5000 mL | Freq: Once | INTRAMUSCULAR | 1 refills | Status: AC

## 2016-11-19 NOTE — Addendum Note (Signed)
Addended by: Terence Lux B on: 11/19/2016 11:33 AM   Modules accepted: Orders

## 2016-11-19 NOTE — Assessment & Plan Note (Signed)
Stressed weight loss - increase exercise, decrease portions

## 2016-11-19 NOTE — Assessment & Plan Note (Signed)
BP Readings from Last 3 Encounters:  11/19/16 (!) 144/90  05/20/16 140/88  01/23/16 116/74   Slightly high today, was 120/? Yesterday Work on weight loss, increase exercise continue current medication cmp

## 2016-11-19 NOTE — Assessment & Plan Note (Signed)
Check lipid panel If elevated should try a different statin Stressed regular exercise and weight loss

## 2016-11-19 NOTE — Assessment & Plan Note (Signed)
Check a1c Low sugar / carb diet Stressed regular exercise, weight loss  

## 2016-11-21 ENCOUNTER — Encounter: Payer: Self-pay | Admitting: Internal Medicine

## 2016-11-21 DIAGNOSIS — E119 Type 2 diabetes mellitus without complications: Secondary | ICD-10-CM | POA: Insufficient documentation

## 2016-11-21 DIAGNOSIS — E1169 Type 2 diabetes mellitus with other specified complication: Secondary | ICD-10-CM | POA: Insufficient documentation

## 2016-11-28 ENCOUNTER — Ambulatory Visit (INDEPENDENT_AMBULATORY_CARE_PROVIDER_SITE_OTHER): Payer: Medicare Other

## 2016-11-28 ENCOUNTER — Ambulatory Visit (INDEPENDENT_AMBULATORY_CARE_PROVIDER_SITE_OTHER): Payer: Medicare Other | Admitting: Podiatry

## 2016-11-28 ENCOUNTER — Encounter: Payer: Self-pay | Admitting: Podiatry

## 2016-11-28 DIAGNOSIS — M79671 Pain in right foot: Secondary | ICD-10-CM

## 2016-11-28 DIAGNOSIS — B351 Tinea unguium: Secondary | ICD-10-CM

## 2016-11-28 DIAGNOSIS — M722 Plantar fascial fibromatosis: Secondary | ICD-10-CM

## 2016-11-28 DIAGNOSIS — R52 Pain, unspecified: Secondary | ICD-10-CM

## 2016-11-28 MED ORDER — MELOXICAM 15 MG PO TABS: 15.0000 mg | ORAL_TABLET | Freq: Every day | ORAL | 2 refills | Status: DC

## 2016-11-28 NOTE — Patient Instructions (Signed)

## 2016-12-05 DIAGNOSIS — M722 Plantar fascial fibromatosis: Secondary | ICD-10-CM | POA: Insufficient documentation

## 2016-12-05 NOTE — Progress Notes (Signed)
Subjective:    Patient ID: Frances Gray, female   DOB: 65 y.o.   MRN: [id number]   HPI 66 year old female presents the office with concerns of pain to the bottom of the right heel is been ongoing for about 2 months. She denies any recent injury or trauma. She has no numbness or tingling. The pain does not wake her up at night. She denies any claudication symptoms. She describes as a throbbing sensation. She states that she gets pain after she walks on it for she's been on for some time. She had no recent treatment for this. She also uses her left great toenail thick and occasionally gets painful. She said no recent treatment for this. She denies any redness or drainage or any swelling. Drainage or pus. She has no other concerns.   Review of Systems  All other systems reviewed and are negative.       Objective:  Physical Exam General: AAO x3, NAD  Dermatological: Left hallux nail is dystrophic, hypertrophic as well as mildly discolored with yellow to brown discoloration. There is no edema, erythema, drainage or pus there is no clinical signs of infection. There are no open sores, no preulcerative lesions, no rash or signs of infection present.  Vascular: Dorsalis Pedis artery and Posterior Tibial artery pedal pulses are 2/4 bilateral with immedate capillary fill time. There is no pain with calf compression, swelling, warmth, erythema.   Neruologic: Grossly intact via light touch bilateral. Vibratory intact via tuning fork bilateral. Protective threshold with Semmes Wienstein monofilament intact to all pedal sites bilateral. Negative Tinel sign.   Musculoskeletal: Tenderness to palpation along the plantar medial tubercle of the calcaneus at the insertion of plantar fascia on the right foot. There is no pain along the course of the plantar fascia within the arch of the foot. Plantar fascia appears to be intact. There is no pain with lateral compression of the calcaneus or pain with vibratory  sensation. There is no pain along the course or insertion of the achilles tendon. No other areas of tenderness to bilateral lower extremities.  Muscular strength 5/5 in all groups tested bilateral.  Gait: Unassisted, Nonantalgic.      Assessment:     Right plantar fasciitis, left onychomycosis    Plan:  -Treatment options discussed including all alternatives, risks, and complications -Etiology of symptoms were discussed -X-rays were obtained and reviewed with the patient. No evidence of acute fracture identified. -Patient elects to proceed with steroid injection into the right heel. Under sterile skin preparation, a total of 2.5cc of kenalog 10, 0.5% Marcaine plain, and 2% lidocaine plain were infiltrated into the symptomatic area without complication. A band-aid was applied. Patient tolerated the injection well without complication. Post-injection care with discussed with the patient. Discussed with the patient to ice the area over the next couple of days to help prevent a steroid flare.  -Prescribed mobic. Discussed side effects of the medication and directed to stop if any are to occur and call the office.  -Plantar fascial brace was dispensed -Stretching, icing exercises daily. -Discussed female medications and orthotics. -Discussed treatment options for onychomycosis -Follow-up 3 weeks or sooner if needed. She was encouraged to call any questions or concerns or any change in symptoms.  Celesta Gentile, DPM

## 2016-12-19 ENCOUNTER — Ambulatory Visit (INDEPENDENT_AMBULATORY_CARE_PROVIDER_SITE_OTHER): Payer: Medicare Other | Admitting: Podiatry

## 2016-12-19 DIAGNOSIS — B351 Tinea unguium: Secondary | ICD-10-CM

## 2016-12-19 DIAGNOSIS — M722 Plantar fascial fibromatosis: Secondary | ICD-10-CM | POA: Diagnosis not present

## 2016-12-19 MED ORDER — TRIAMCINOLONE ACETONIDE 10 MG/ML IJ SUSP
10.0000 mg | Freq: Once | INTRAMUSCULAR | Status: AC
  Administered 2016-12-19: 10 mg

## 2016-12-20 NOTE — Progress Notes (Signed)
Subjective: Frances Gray presents to the office today for follow-up evaluation of right heel pain. They state that they are doing better but still having some pain. She states the injection did help. They have been icing, stretching, try to wear supportive shoe as much as possible. No other complaints at this time. No acute changes since last appointment. They deny any systemic complaints such as fevers, chills, nausea, vomiting.  Objective: General: AAO x3, NAD  Dermatological: Skin is warm, dry and supple bilateral. She states the left hallux toenail has been looking better. Still somewhat dystrophic, discolored there is no swelling or redness or any drainage and the toenail sites. There are no open sores, no preulcerative lesions, no rash or signs of infection present.  Vascular: Dorsalis Pedis artery and Posterior Tibial artery pedal pulses are 2/4 bilateral with immedate capillary fill time. Pedal hair growth present. There is no pain with calf compression, swelling, warmth, erythema.   Neruologic: Grossly intact via light touch bilateral. Vibratory intact via tuning fork bilateral. Protective threshold with Semmes Wienstein monofilament intact to all pedal sites bilateral.   Musculoskeletal: There is improved but continued tenderness palpation along the plantar medial tubercle of the calcaneus at the insertion of the plantar fascia on the right foot. There is no pain along the course of the plantar fascia within the arch of the foot. Plantar fascia appears to be intact bilaterally. There is no pain with lateral compression of the calcaneus and there is no pain with vibratory sensation. There is no pain along the course or insertion of the Achilles tendon. There are no other areas of tenderness to bilateral lower extremities. No gross boney pedal deformities bilateral. No pain, crepitus, or limitation noted with foot and ankle range of motion bilateral. Muscular strength 5/5 in all groups tested  bilateral.  Gait: Unassisted, Nonantalgic.   Assessment: Presents for follow-up evaluation for heel pain, likely plantar fasciitis   Plan: -Treatment options discussed including all alternatives, risks, and complications -Patient elects to proceed with steroid injection into the right heel. Under sterile skin preparation, a total of 2.5cc of kenalog 10, 0.5% Marcaine plain, and 2% lidocaine plain were infiltrated into the symptomatic area without complication. A band-aid was applied. Patient tolerated the injection well without complication. Post-injection care with discussed with the patient. Discussed with the patient to ice the area over the next couple of days to help prevent a steroid flare.  -Night splint dispensed -Ice and stretching exercises on a daily basis. -Continue supportive shoe gear. -Continue compound cream for onychomycosis. -Follow-up as scheduled or sooner if any problems arise. In the meantime, encouraged to call the office with any questions, concerns, change in symptoms.   Celesta Gentile, DPM

## 2017-01-17 ENCOUNTER — Ambulatory Visit (INDEPENDENT_AMBULATORY_CARE_PROVIDER_SITE_OTHER): Payer: Medicare Other | Admitting: Podiatry

## 2017-01-17 ENCOUNTER — Encounter: Payer: Self-pay | Admitting: Podiatry

## 2017-01-17 DIAGNOSIS — B351 Tinea unguium: Secondary | ICD-10-CM | POA: Diagnosis not present

## 2017-01-17 DIAGNOSIS — M722 Plantar fascial fibromatosis: Secondary | ICD-10-CM | POA: Diagnosis not present

## 2017-01-17 MED ORDER — TRIAMCINOLONE ACETONIDE 10 MG/ML IJ SUSP
10.0000 mg | Freq: Once | INTRAMUSCULAR | Status: AC
  Administered 2017-01-17: 10 mg

## 2017-01-17 NOTE — Progress Notes (Signed)
Subjective: Frances Gray presents to the office today for follow-up evaluation of right heel pain. She states that she has very minimal pain and she is doing much better. His continue with stretching, icing exercises and a daily basis. She states a left big toenail is somewhat loose and causes some intermittent discomfort but she denies any pain to the area today. She's been using the cream to the toenail daily except for the last couple of days out of expired. She denies any redness or drainage or signs of the toenail. She has no other concerns today.  Objective: General: AAO x3, NAD  Dermatological: Skin is warm, dry and supple bilateral. The left hallux toenail is somewhat loosened underlying nailbed distally there is dry skin on the cuticle. This no pain in the nail. This incurvation of both the medial and lateral nail borders. There is no edema, erythema, drainage pus and there is no signs of infection the toenail. No open lesions are identified today.  Vascular: Dorsalis Pedis artery and Posterior Tibial artery pedal pulses are 2/4 bilateral with immedate capillary fill time. Pedal hair growth present. There is no pain with calf compression, swelling, warmth, erythema.   Neruologic: Grossly intact via light touch bilateral. Vibratory intact via tuning fork bilateral. Protective threshold with Semmes Wienstein monofilament intact to all pedal sites bilateral.   Musculoskeletal: There is minimal tenderness palpation along the plantar medial tubercle of the calcaneus at the insertion of the plantar fascia on the right foot. There is no pain along the course of the plantar fascia within the arch of the foot. Plantar fascia appears to be intact bilaterally. There is no pain with lateral compression of the calcaneus and there is no pain with vibratory sensation. There is no pain along the course or insertion of the Achilles tendon. There are no other areas of tenderness to bilateral lower extremities.  No gross boney pedal deformities bilateral. No pain, crepitus, or limitation noted with foot and ankle range of motion bilateral. Muscular strength 5/5 in all groups tested bilateral.  Gait: Unassisted, Nonantalgic.   Assessment: Presents for follow-up evaluation for heel pain, likely plantar fasciitis   Plan: -Treatment options discussed including all alternatives, risks, and complications -Patient elects to proceed with steroid injection into the right heel. Under sterile skin preparation, a total of 2.5cc of kenalog 10, 0.5% Marcaine plain, and 2% lidocaine plain were infiltrated into the symptomatic area without complication. A band-aid was applied. Patient tolerated the injection well without complication. Post-injection care with discussed with the patient. Discussed with the patient to ice the area over the next couple of days to help prevent a steroid flare.  -Continue night splint and Dynasplint plantar fascial brace in the day. -Ice and stretching exercises on a daily basis. -Continue supportive shoe gear. -Nails trimmed loose and is a hold off on antifungal medicine at this point. On the toenail grow out and if there is still fungus for return to the antifungal medicine. -Follow-up if symptoms are still presents 4 weeks or sooner if needed. In the meantime, encouraged to call the office with any questions, concerns, change in symptoms.   Celesta Gentile, DPM

## 2017-01-23 ENCOUNTER — Other Ambulatory Visit: Payer: Self-pay | Admitting: Emergency Medicine

## 2017-01-23 MED ORDER — LOSARTAN POTASSIUM-HCTZ 100-25 MG PO TABS: 1.0000 | ORAL_TABLET | Freq: Every day | ORAL | 1 refills | Status: DC

## 2017-02-26 NOTE — Progress Notes (Signed)
Subjective:    Patient ID: Frances Gray, female    DOB: [birth year], 66 y.o.   MRN: [id number]  HPI The patient is here for follow up.  Hypertension: She is taking her medication daily. She is compliant with a low sodium diet.  She denies chest pain, palpitations, edema, shortness of breath and regular headaches. She is not exercising regularly.  She does not monitor her blood pressure at home.    Diabetes: She is controlling her sugars with diet.  She was just diagnosed with diabetes three months ago. She has not been compliant with a diabetic diet. She was walking regularly, but has not been exercising regularly.  She checks her feet daily and denies foot lesions. She is up-to-date with an ophthalmology examination.   Left shoulder pain;  She has decreased ROM and pain.   This started a while ago and it has not improved and when she thinks she would like further evaluation.  Medications and allergies reviewed with patient and updated if appropriate.  Patient Active Problem List   Diagnosis Date Noted  . Plantar fasciitis, right 12/05/2016  . Diabetes mellitus without complication ([place]) 04/03/8526  . Nodule of flexor tendon sheath 01/23/2016  . Midline low back pain without sciatica 01/23/2016  . Knee osteoarthritis 12/06/2014  . Hyperlipidemia   . Osteopenia 12/29/2013  . Severe obesity (BMI >= 40) ([place]) 11/16/2012  . 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 Anemia, unspecified 08/14/2011  . Vitamin D deficiency disease 08/14/2011  . Kidney stone 07/03/2011  . GERD (gastroesophageal reflux disease)   . Hypertension     Current Outpatient Prescriptions on File Prior to Visit  Medication Sig Dispense Refill  . calcium carbonate (CALCIUM 600) 600 MG TABS tablet Take 1 tablet (600 mg total) by mouth 2 (two) times daily with a meal. 60 tablet 6  . Cholecalciferol (VITAMIN D3) 2000 UNITS TABS Take 2,000 Units by mouth every morning.     . fluticasone (FLONASE) 50 MCG/ACT  nasal spray Place 1 spray into both nostrils daily as needed for allergies or rhinitis.   0  . losartan-hydrochlorothiazide (HYZAAR) 100-25 MG tablet Take 1 tablet by mouth daily. 90 tablet 1  . Multiple Vitamin (MULTIVITAMIN) tablet Take 1 tablet by mouth every morning.     . sennosides-docusate sodium (SENOKOT-S) 8.6-50 MG tablet Take 2 tablets by mouth daily. 30 tablet 1   No current facility-administered medications on file prior to visit.     Past Medical History:  Diagnosis Date  . Allergic rhinitis, seasonal    takes Claritin daily as needed and uses Flonase if needed  . 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 Diverticulitis   . GERD (gastroesophageal reflux disease)    doesn't take any meds  . Heart murmur    no symptoms  . History of bronchitis winter of 2014  . History of colon polyps    benign  . History of kidney stones   . Hyperlipidemia   . Hypertension    takes Hyzaar daily  . Vitamin D deficiency disease 08/14/2011   takes Vit D daily    Past Surgical History:  Procedure Laterality Date  . BREAST BIOPSY Left   . Breast ultrasound Right 03/02/13   The 8mm complex cyst in the right breast is probably benign. Follow-up in 6 months with bilateral mammogram and (R) ultrasound  . Breast Ultrasound Right 09/02/13   There is a benign 3 mm  complex cyst in the (R) breast at 9 o'clock  . COLONOSCOPY    . ESOPHAGUS SURGERY  1996   tumor removed   (benign)  . KNEE ARTHROSCOPY     bilateral  . LITHOTRIPSY  08/2011   R ureteral stone  . TOTAL KNEE ARTHROPLASTY Left 11/16/2012   Procedure: TOTAL KNEE ARTHROPLASTY- left;  Surgeon: Johnny Bridge, MD;  Location: [facility];  Service: Orthopedics;  Laterality: Left;  . TOTAL KNEE ARTHROPLASTY Right 12/06/2014   Procedure: TOTAL KNEE ARTHROPLASTY;  Surgeon: Marchia Bond, MD;  Location: [facility];  Service: Orthopedics;  Laterality: Right;    Social History   Social History  . Marital status: Married    Spouse name:  N/A  . Number of children: N/A  . Years of education: N/A   Social History Main Topics  . Smoking status: Former Smoker    Packs/day: 0.25    Years: 10.00  . Smokeless tobacco: Never Used     Comment: quit smoking 91yrs ago  . Alcohol use No  . Drug use: No  . Sexual activity: Yes    Birth control/ protection: Post-menopausal   Other Topics Concern  . None   Social History Narrative   Lives with spouse and g-son, temporarily mother in law (05/2014)    Family History  Problem Relation Age of Onset  . Ovarian cancer Mother   . Colon cancer Sister 15  . Breast cancer Sister   . Esophageal cancer Neg Hx     Review of Systems  Constitutional: Negative for fever.  Eyes: Negative for visual disturbance.  Respiratory: Negative for cough, shortness of breath and wheezing.   Cardiovascular: Negative for chest pain, palpitations and leg swelling.  Gastrointestinal: Negative for nausea.  Endocrine: Negative for polydipsia and polyuria.  Neurological: Negative for headaches.  Psychiatric/Behavioral: Negative for confusion.       Objective:   Vitals:   02/27/17 0950  BP: 136/80  Pulse: (!) 57  Resp: 18  Temp: 97.9 F (36.6 C)  SpO2: 98%   Wt Readings from Last 3 Encounters:  02/27/17 279 lb (126.6 kg)  11/19/16 276 lb (125.2 kg)  05/20/16 281 lb (127.5 kg)   Body mass index is 47.89 kg/m.   Physical Exam    Constitutional: Appears well-developed and well-nourished. No distress.  HENT:  Head: Normocephalic and atraumatic.  Neck: Neck supple. No tracheal deviation present. No thyromegaly present.  No cervical lymphadenopathy Cardiovascular: Normal rate, regular rhythm and normal heart sounds.   No murmur heard. No carotid bruit .  No edema Pulmonary/Chest: Effort normal and breath sounds normal. No respiratory distress. No has no wheezes. No rales.  Skin: Skin is warm and dry. Not diaphoretic.  Psychiatric: Normal mood and affect. Behavior is normal.       Assessment & Plan:    See Problem List for Assessment and Plan of chronic medical problems.

## 2017-02-26 NOTE — Patient Instructions (Addendum)
  Flu immunization administered today.   Medications reviewed and updated.   Metformin was started for your sugars    Your a1c was done.   Please followup in December as scheduled.

## 2017-02-27 ENCOUNTER — Ambulatory Visit (INDEPENDENT_AMBULATORY_CARE_PROVIDER_SITE_OTHER): Payer: Medicare Other | Admitting: Internal Medicine

## 2017-02-27 ENCOUNTER — Encounter: Payer: Self-pay | Admitting: Internal Medicine

## 2017-02-27 VITALS — BP 136/80 | HR 57 | Temp 97.9°F | Resp 18 | Ht 64.0 in | Wt 279.0 lb

## 2017-02-27 DIAGNOSIS — I10 Essential (primary) hypertension: Secondary | ICD-10-CM

## 2017-02-27 DIAGNOSIS — E7800 Pure hypercholesterolemia, unspecified: Secondary | ICD-10-CM | POA: Diagnosis not present

## 2017-02-27 DIAGNOSIS — E119 Type 2 diabetes mellitus without complications: Secondary | ICD-10-CM | POA: Diagnosis not present

## 2017-02-27 DIAGNOSIS — Z23 Encounter for immunization: Secondary | ICD-10-CM | POA: Diagnosis not present

## 2017-02-27 LAB — POCT GLYCOSYLATED HEMOGLOBIN (HGB A1C): Hemoglobin A1C: 6.2

## 2017-02-27 MED ORDER — METFORMIN HCL 500 MG PO TABS: 500.0000 mg | ORAL_TABLET | Freq: Two times a day (BID) | ORAL | 5 refills | Status: DC

## 2017-02-27 MED ORDER — ZOSTER VAC RECOMB ADJUVANTED 50 MCG/0.5ML IM SUSR: 0.5000 mL | Freq: Once | INTRAMUSCULAR | 0 refills | Status: AC

## 2017-02-27 NOTE — Assessment & Plan Note (Signed)
discussed that she should be on a statin -- did not tolerate a statin previously Will work on lifestyle but will eventually need to be on a statin

## 2017-02-27 NOTE — Assessment & Plan Note (Addendum)
Diagnosed with diabetes 3 months ago Initially was very compliant with a diabetic diet and is exercising, but is not doing either recently Will check A1c Discussed that we can still start medication to help keep her sugars well controlled and possibly a and weight loss - will start metformin - she will call if she has side effects Discussed the importance of annual eye exam

## 2017-02-27 NOTE — Assessment & Plan Note (Signed)
Stressed regular exercise and healthy diet Discussed possible nutrition referral

## 2017-04-21 ENCOUNTER — Other Ambulatory Visit: Payer: Self-pay | Admitting: Emergency Medicine

## 2017-04-21 MED ORDER — LOSARTAN POTASSIUM-HCTZ 100-25 MG PO TABS: 1.0000 | ORAL_TABLET | Freq: Every day | ORAL | 1 refills | Status: DC

## 2017-05-19 NOTE — Patient Instructions (Addendum)
Test(s) ordered today. Your results will be released to [facility] (or called to you) after review, usually within 72hours after test completion. If any changes need to be made, you will be notified at that same time.  Follow up in 6 months   Please have eye doctor send eye exam results to Dr. Quay Burow Fax: [phone]. Please ask doctor to send your mammogram results to Dr. Quay Burow.   Continue doing brain stimulating activities (puzzles, reading, adult coloring books, staying active) to keep memory sharp.   Continue to eat heart healthy diet (full of fruits, vegetables, whole grains, lean protein, water--limit salt, fat, and sugar intake) and increase physical activity as tolerated.   Frances Gray , Thank you for taking time to come for your Medicare Wellness Visit. I appreciate your ongoing commitment to your health goals. Please review the following plan we discussed and let me know if I can assist you in the future.   These are the goals we discussed: Goals    . Patient Stated     Lose weight by continuing to monitor my diet for sugar and carbohydrates and increase my physical activity.        This is a list of the screening recommended for you and due dates:  Health Maintenance  Topic Date Due  . Complete foot exam   11/28/1959  . Eye exam for diabetics  11/28/1959  . Mammogram  09/03/2015  . Hemoglobin A1C  08/27/2017  . DEXA scan (bone density measurement)  06/24/2018  . Colon Cancer Screening  10/21/2018  . Tetanus Vaccine  10/21/2020  . Flu Shot  Completed  .  Hepatitis C: One time screening is recommended by Center for Disease Control  (CDC) for  adults born from [birth year] through 1966.   Completed  . Pneumonia vaccines  Completed     Carbohydrate Counting for Diabetes Mellitus, Adult Carbohydrate counting is a method for keeping track of how many carbohydrates you eat. Eating carbohydrates naturally increases the amount of sugar (glucose) in the blood. Counting how many  carbohydrates you eat helps keep your blood glucose within normal limits, which helps you manage your diabetes (diabetes mellitus). It is important to know how many carbohydrates you can safely have in each meal. This is different for every person. A diet and nutrition specialist (registered dietitian) can help you make a meal plan and calculate how many carbohydrates you should have at each meal and snack. Carbohydrates are found in the following foods:  Grains, such as breads and cereals.  Dried beans and soy products.  Starchy vegetables, such as potatoes, peas, and corn.  Fruit and fruit juices.  Milk and yogurt.  Sweets and snack foods, such as cake, cookies, candy, chips, and soft drinks.  How do I count carbohydrates? There are two ways to count carbohydrates in food. You can use either of the methods or a combination of both. Reading "Nutrition Facts" on packaged food The "Nutrition Facts" list is included on the labels of almost all packaged foods and beverages in the [place] It includes:  The serving size.  Information about nutrients in each serving, including the grams (g) of carbohydrate per serving.  To use the "Nutrition Facts":  Decide how many servings you will have.  Multiply the number of servings by the number of carbohydrates per serving.  The resulting number is the total amount of carbohydrates that you will be having.  Learning standard serving sizes of other foods When you eat foods containing carbohydrates  that are not packaged or do not include "Nutrition Facts" on the label, you need to measure the servings in order to count the amount of carbohydrates:  Measure the foods that you will eat with a food scale or measuring cup, if needed.  Decide how many standard-size servings you will eat.  Multiply the number of servings by 15. Most carbohydrate-rich foods have about 15 g of carbohydrates per serving. ? For example, if you eat 8 oz (170 g) of  strawberries, you will have eaten 2 servings and 30 g of carbohydrates (2 servings x 15 g = 30 g).  For foods that have more than one food mixed, such as soups and casseroles, you must count the carbohydrates in each food that is included.  The following list contains standard serving sizes of common carbohydrate-rich foods. Each of these servings has about 15 g of carbohydrates:   hamburger bun or  English muffin.   oz (15 mL) syrup.   oz (14 g) jelly.  1 slice of bread.  1 six-inch tortilla.  3 oz (85 g) cooked rice or pasta.  4 oz (113 g) cooked dried beans.  4 oz (113 g) starchy vegetable, such as peas, corn, or potatoes.  4 oz (113 g) hot cereal.  4 oz (113 g) mashed potatoes or  of a large baked potato.  4 oz (113 g) canned or frozen fruit.  4 oz (120 mL) fruit juice.  4-6 crackers.  6 chicken nuggets.  6 oz (170 g) unsweetened dry cereal.  6 oz (170 g) plain fat-free yogurt or yogurt sweetened with artificial sweeteners.  8 oz (240 mL) milk.  8 oz (170 g) fresh fruit or one small piece of fruit.  24 oz (680 g) popped popcorn.  Example of carbohydrate counting Sample meal  3 oz (85 g) chicken breast.  6 oz (170 g) brown rice.  4 oz (113 g) corn.  8 oz (240 mL) milk.  8 oz (170 g) strawberries with sugar-free whipped topping. Carbohydrate calculation 1. Identify the foods that contain carbohydrates: ? Rice. ? Corn. ? Milk. ? Strawberries. 2. Calculate how many servings you have of each food: ? 2 servings rice. ? 1 serving corn. ? 1 serving milk. ? 1 serving strawberries. 3. Multiply each number of servings by 15 g: ? 2 servings rice x 15 g = 30 g. ? 1 serving corn x 15 g = 15 g. ? 1 serving milk x 15 g = 15 g. ? 1 serving strawberries x 15 g = 15 g. 4. Add together all of the amounts to find the total grams of carbohydrates eaten: ? 30 g + 15 g + 15 g + 15 g = 75 g of carbohydrates total. This information is not intended to replace  advice given to you by your health care provider. Make sure you discuss any questions you have with your health care provider. Document Released: 05/27/2005 Document Revised: 12/15/2015 Document Reviewed: 11/08/2015 Elsevier Interactive Patient Education  [employer].

## 2017-05-19 NOTE — Progress Notes (Signed)
Subjective:    Patient ID: Frances Gray, female    DOB: 07-17-1950, 66 y.o.   MRN: [id number]  HPI The patient is here for follow up.  Diabetes: She was diagnosed with diabetes in 11/2016.  She is not taking her metformin - she took it once and it caused diarrhea and she stopped it.  She is more compliant with a diabetic diet. She has made several changes to her diet. She is not exercising regularly. She is not up-to-date with a diabetic ophthalmology examination, but has an appointment.   Hypertension: She is taking her medication daily. She is compliant with a low sodium diet.  She denies chest pain, palpitations, edema, shortness of breath and regular headachShe does not monitor her blood pressure at home.    Hyperlipidemia: She is not on any medication.  her medication daily. She is compliant with a low fat/cholesterol diet. She is not exercising regularly.      Medications and allergies reviewed with patient and updated if appropriate.  Patient Active Problem List   Diagnosis Date Noted  . Plantar fasciitis, right 12/05/2016  . Diabetes mellitus without complication ([place]) 08/67/6195  . Nodule of flexor tendon sheath 01/23/2016  . Midline low back pain without sciatica 01/23/2016  . Knee osteoarthritis 12/06/2014  . Hyperlipidemia   . Osteopenia 12/29/2013  . Severe obesity (BMI >= 40) ([place]) 11/16/2012  . 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 Anemia, unspecified 08/14/2011  . Vitamin D deficiency disease 08/14/2011  . Kidney stone 07/03/2011  . GERD (gastroesophageal reflux disease)   . Hypertension     Current Outpatient Medications on File Prior to Visit  Medication Sig Dispense Refill  . calcium carbonate (CALCIUM 600) 600 MG TABS tablet Take 1 tablet (600 mg total) by mouth 2 (two) times daily with a meal. 60 tablet 6  . Cholecalciferol (VITAMIN D3) 2000 UNITS TABS Take 2,000 Units by mouth every morning.     . fluticasone (FLONASE) 50 MCG/ACT nasal spray  Place 1 spray into both nostrils daily as needed for allergies or rhinitis.   0  . losartan-hydrochlorothiazide (HYZAAR) 100-25 MG tablet Take 1 tablet daily by mouth. 90 tablet 1  . Multiple Vitamin (MULTIVITAMIN) tablet Take 1 tablet by mouth every morning.     . sennosides-docusate sodium (SENOKOT-S) 8.6-50 MG tablet Take 2 tablets by mouth daily. (Patient taking differently: Take 1 tablet by mouth daily. ) 30 tablet 1   No current facility-administered medications on file prior to visit.     Past Medical History:  Diagnosis Date  . Allergic rhinitis, seasonal    takes Claritin daily as needed and uses Flonase if needed  . 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 Diverticulitis   . GERD (gastroesophageal reflux disease)    doesn't take any meds  . Heart murmur    no symptoms  . History of bronchitis winter of 2014  . History of colon polyps    benign  . History of kidney stones   . Hyperlipidemia   . Hypertension    takes Hyzaar daily  . Vitamin D deficiency disease 08/14/2011   takes Vit D daily    Past Surgical History:  Procedure Laterality Date  . BREAST BIOPSY Left   . Breast ultrasound Right 03/02/13   The 35mm complex cyst in the right breast is probably benign. Follow-up in 6 months with bilateral mammogram and (R) ultrasound  . Breast Ultrasound Right  09/02/13   There is a benign 3 mm complex cyst in the (R) breast at 9 o'clock  . COLONOSCOPY    . ESOPHAGUS SURGERY  1996   tumor removed   (benign)  . KNEE ARTHROSCOPY     bilateral  . LITHOTRIPSY  08/2011   R ureteral stone  . TOTAL KNEE ARTHROPLASTY Left 11/16/2012   Procedure: TOTAL KNEE ARTHROPLASTY- left;  Surgeon: Johnny Bridge, MD;  Location: [facility];  Service: Orthopedics;  Laterality: Left;  . TOTAL KNEE ARTHROPLASTY Right 12/06/2014   Procedure: TOTAL KNEE ARTHROPLASTY;  Surgeon: Marchia Bond, MD;  Location: [facility];  Service: Orthopedics;  Laterality: Right;    Social History    Socioeconomic History  . Marital status: Married    Spouse name: None  . Number of children: None  . Years of education: None  . Highest education level: None  Social Needs  . Financial resource strain: Not hard at all  . Food insecurity - worry: Never true  . Food insecurity - inability: Never true  . Transportation needs - medical: No  . Transportation needs - non-medical: No  Occupational History  . None  Tobacco Use  . Smoking status: Former Smoker    Packs/day: 0.25    Years: 10.00    Pack years: 2.50  . Smokeless tobacco: Never Used  . Tobacco comment: quit smoking 40yrs ago  Substance and Sexual Activity  . Alcohol use: No  . Drug use: No  . Sexual activity: Yes    Birth control/protection: Post-menopausal  Other Topics Concern  . None  Social History Narrative   Lives with spouse and g-son, temporarily mother in law (05/2014)    Family History  Problem Relation Age of Onset  . Ovarian cancer Mother   . Colon cancer Sister 42  . Breast cancer Sister   . Esophageal cancer Neg Hx     Review of Systems  Constitutional: Negative for chills and fever.  Respiratory: Negative for cough, shortness of breath and wheezing.   Cardiovascular: Negative for chest pain, palpitations and leg swelling.  Neurological: Negative for light-headedness, numbness and headaches.       Objective:   Vitals:   05/21/17 1019  BP: 132/78  Pulse: (!) 55  Resp: 20  Temp: (!) 97.5 F (36.4 C)  SpO2: 97%   Wt Readings from Last 3 Encounters:  05/21/17 276 lb (125.2 kg)  02/27/17 279 lb (126.6 kg)  11/19/16 276 lb (125.2 kg)   Body mass index is 47.38 kg/m.   Physical Exam    Constitutional: Appears well-developed and well-nourished. No distress.  HENT:  Head: Normocephalic and atraumatic.  Neck: Neck supple. No tracheal deviation present. No thyromegaly present.  No cervical lymphadenopathy Cardiovascular: Normal rate, regular rhythm and normal heart sounds.   No  murmur heard. No carotid bruit .  No edema Pulmonary/Chest: Effort normal and breath sounds normal. No respiratory distress. No has no wheezes. No rales.  Skin: Skin is warm and dry. Not diaphoretic.  Psychiatric: Normal mood and affect. Behavior is normal.      Assessment & Plan:    See Problem List for Assessment and Plan of chronic medical problems.

## 2017-05-21 ENCOUNTER — Encounter: Payer: Self-pay | Admitting: Internal Medicine

## 2017-05-21 ENCOUNTER — Other Ambulatory Visit (INDEPENDENT_AMBULATORY_CARE_PROVIDER_SITE_OTHER): Payer: Medicare Other

## 2017-05-21 ENCOUNTER — Ambulatory Visit (INDEPENDENT_AMBULATORY_CARE_PROVIDER_SITE_OTHER): Payer: Medicare Other | Admitting: Internal Medicine

## 2017-05-21 VITALS — BP 132/78 | HR 55 | Temp 97.5°F | Resp 20 | Ht 64.0 in | Wt 276.0 lb

## 2017-05-21 DIAGNOSIS — I10 Essential (primary) hypertension: Secondary | ICD-10-CM | POA: Diagnosis not present

## 2017-05-21 DIAGNOSIS — E119 Type 2 diabetes mellitus without complications: Secondary | ICD-10-CM | POA: Diagnosis not present

## 2017-05-21 DIAGNOSIS — E7849 Other hyperlipidemia: Secondary | ICD-10-CM

## 2017-05-21 LAB — COMPREHENSIVE METABOLIC PANEL
ALBUMIN: 3.8 g/dL (ref 3.5–5.2)
ALT: 17 U/L (ref 0–35)
AST: 19 U/L (ref 0–37)
Alkaline Phosphatase: 63 U/L (ref 39–117)
BUN: 12 mg/dL (ref 6–23)
CHLORIDE: 104 meq/L (ref 96–112)
CO2: 31 meq/L (ref 19–32)
CREATININE: 0.63 mg/dL (ref 0.40–1.20)
Calcium: 9.3 mg/dL (ref 8.4–10.5)
GFR: 121.04 mL/min (ref >60.00)
GLUCOSE: 94 mg/dL (ref 70–99)
POTASSIUM: 4.1 meq/L (ref 3.5–5.1)
SODIUM: 141 meq/L (ref 135–145)
Total Bilirubin: 0.4 mg/dL (ref 0.2–1.2)
Total Protein: 7.8 g/dL (ref 6.0–8.3)

## 2017-05-21 LAB — LIPID PANEL
CHOL/HDL RATIO: 4
Cholesterol: 186 mg/dL (ref 0–200)
HDL: 52.2 mg/dL (ref >39.00)
LDL CALC: 108 mg/dL — AB (ref 0–99)
NONHDL: 133.37
TRIGLYCERIDES: 128 mg/dL (ref 0.0–149.0)
VLDL: 25.6 mg/dL (ref 0.0–40.0)

## 2017-05-21 LAB — HEMOGLOBIN A1C: HEMOGLOBIN A1C: 6.4 % (ref 4.6–6.5)

## 2017-05-21 NOTE — Assessment & Plan Note (Signed)
BP well controlled Current regimen effective and well tolerated Continue current medications at current doses cmp  

## 2017-05-21 NOTE — Progress Notes (Signed)
Subjective:   Frances Gray is a 66 y.o. female who presents for Medicare Annual (Subsequent) preventive examination.  Review of Systems:  No ROS.  Medicare Wellness Visit. Additional risk factors are reflected in the social history.  Cardiac Risk Factors include: advanced age (>40men, >17 women);diabetes mellitus;dyslipidemia;hypertension;obesity (BMI >30kg/m2) Sleep patterns: gets up 1-2 times nightly to void and sleeps 7-8 hours nightly.   Home Safety/Smoke Alarms: Feels safe in home. Smoke alarms in place.  Living environment; residence and Firearm Safety: 1-story house/ trailer, no firearms.Lives with family, no needs for DME, good support system Seat Belt Safety/Bike Helmet: Wears seat belt.     Objective:     Vitals: BP 132/78   Pulse (!) 55   Temp (!) 97.5 F (36.4 C)   Resp 20   Ht 5\' 4"  (1.626 m)   Wt 276 lb (125.2 kg)   SpO2 97%   BMI 47.38 kg/m   Body mass index is 47.38 kg/m.  Advanced Directives 05/21/2017 11/24/2014 05/12/2014 11/19/2012 11/10/2012  Does Patient Have a Medical Advance Directive? No No No Patient does not have advance directive;Patient would like information Patient does not have advance directive;Patient would like information  Would patient like information on creating a medical advance directive? Yes (ED - Information included in AVS) Yes - Educational materials given Yes - Educational materials given Advance directive packet given Advance directive packet given  Pre-existing out of facility DNR order (yellow form or pink MOST form) - - - No -    Tobacco Social History   Tobacco Use  Smoking Status Former Smoker  . Packs/day: 0.25  . Years: 10.00  . Pack years: 2.50  Smokeless Tobacco Never Used  Tobacco Comment   quit smoking 30yrs ago     Counseling given: Not Answered Comment: quit smoking 60yrs ago     Past Medical History:  Diagnosis Date  . Allergic rhinitis, seasonal    takes Claritin daily as needed and uses Flonase if  needed  . 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 Diverticulitis   . GERD (gastroesophageal reflux disease)    doesn't take any meds  . Heart murmur    no symptoms  . History of bronchitis winter of 2014  . History of colon polyps    benign  . History of kidney stones   . Hyperlipidemia   . Hypertension    takes Hyzaar daily  . Vitamin D deficiency disease 08/14/2011   takes Vit D daily   Past Surgical History:  Procedure Laterality Date  . BREAST BIOPSY Left   . Breast ultrasound Right 03/02/13   The 49mm complex cyst in the right breast is probably benign. Follow-up in 6 months with bilateral mammogram and (R) ultrasound  . Breast Ultrasound Right 09/02/13   There is a benign 3 mm complex cyst in the (R) breast at 9 o'clock  . COLONOSCOPY    . ESOPHAGUS SURGERY  1996   tumor removed   (benign)  . KNEE ARTHROSCOPY     bilateral  . LITHOTRIPSY  08/2011   R ureteral stone  . TOTAL KNEE ARTHROPLASTY Left 11/16/2012   Procedure: TOTAL KNEE ARTHROPLASTY- left;  Surgeon: Johnny Bridge, MD;  Location: [facility];  Service: Orthopedics;  Laterality: Left;  . TOTAL KNEE ARTHROPLASTY Right 12/06/2014   Procedure: TOTAL KNEE ARTHROPLASTY;  Surgeon: Marchia Bond, MD;  Location: [facility];  Service: Orthopedics;  Laterality: Right;   Family  History  Problem Relation Age of Onset  . Ovarian cancer Mother   . Colon cancer Sister 76  . Breast cancer Sister   . Esophageal cancer Neg Hx    Social History   Socioeconomic History  . Marital status: Married    Spouse name: None  . Number of children: None  . Years of education: None  . Highest education level: None  Social Needs  . Financial resource strain: Not hard at all  . Food insecurity - worry: Never true  . Food insecurity - inability: Never true  . Transportation needs - medical: No  . Transportation needs - non-medical: No  Occupational History  . None  Tobacco Use  . Smoking status: Former Smoker    Packs/day:  0.25    Years: 10.00    Pack years: 2.50  . Smokeless tobacco: Never Used  . Tobacco comment: quit smoking 70yrs ago  Substance and Sexual Activity  . Alcohol use: No  . Drug use: No  . Sexual activity: Yes    Birth control/protection: Post-menopausal  Other Topics Concern  . None  Social History Narrative   Lives with spouse and g-son, temporarily mother in law (05/2014)    Outpatient Encounter Medications as of 05/21/2017  Medication Sig  . calcium carbonate (CALCIUM 600) 600 MG TABS tablet Take 1 tablet (600 mg total) by mouth 2 (two) times daily with a meal.  . Cholecalciferol (VITAMIN D3) 2000 UNITS TABS Take 2,000 Units by mouth every morning.   . fluticasone (FLONASE) 50 MCG/ACT nasal spray Place 1 spray into both nostrils daily as needed for allergies or rhinitis.   Marland Kitchen losartan-hydrochlorothiazide (HYZAAR) 100-25 MG tablet Take 1 tablet daily by mouth.  . Multiple Vitamin (MULTIVITAMIN) tablet Take 1 tablet by mouth every morning.   . sennosides-docusate sodium (SENOKOT-S) 8.6-50 MG tablet Take 2 tablets by mouth daily. (Patient taking differently: Take 1 tablet by mouth daily. )  . [DISCONTINUED] metFORMIN (GLUCOPHAGE) 500 MG tablet Take 1 tablet (500 mg total) by mouth 2 (two) times daily with a meal. (Patient not taking: Reported on 05/21/2017)   No facility-administered encounter medications on file as of 05/21/2017.     Activities of Daily Living In your present state of health, do you have any difficulty performing the following activities: 05/21/2017  Hearing? N  Vision? N  Difficulty concentrating or making decisions? N  Walking or climbing stairs? N  Dressing or bathing? N  Doing errands, shopping? N  Preparing Food and eating ? N  Using the Toilet? N  In the past six months, have you accidently leaked urine? Y  Do you have problems with loss of bowel control? N  Managing your Medications? N  Managing your Finances? N  Housekeeping or managing your  Housekeeping? N  Some recent data might be hidden      Patient Care Team: Binnie Rail, MD as PCP - General (Internal Medicine) Festus Aloe, MD (Urology) Marchia Bond, MD (Orthopedic Surgery) Armandina Gemma, MD (General Surgery) Katy Apo, MD (Ophthalmology) Princess Bruins, MD (Obstetrics and Gynecology) Inda Castle, MD (Inactive) (Gastroenterology)    Assessment:    Physical assessment deferred to PCP.  Exercise Activities and Dietary recommendations Current Exercise Habits: Home exercise routine, Type of exercise: walking, Time (Minutes): 30, Frequency (Times/Week): 4, Weekly Exercise (Minutes/Week): 120, Intensity: Mild, Exercise limited by: orthopedic condition(s) Diet (meal preparation, eat out, water intake, caffeinated beverages, dairy products, fruits and vegetables): in general, a "healthy" diet  , well  balanced   Reviewed heart healthy and diabetic diet, Diet education was attached to patient's AVS. Diet education was provided via handout.  Goals    . Patient Stated     Lose weight by continuing to monitor my diet for sugar and carbohydrates and increase my physical activity.       Fall Risk Fall Risk  05/21/2017 05/20/2016 05/15/2015 02/11/2013  Falls in the past year? No No Yes No  Number falls in past yr: - - 1 -  Injury with Fall? - - No -  Follow up - - Falls prevention discussed -   IDepression Screen PHQ 2/9 Scores 05/21/2017 05/20/2016 05/15/2015 02/11/2013  PHQ - 2 Score 1 0 1 0  PHQ- 9 Score 2 - - -     Cognitive Function       Ad8 score reviewed for issues:  Issues making decisions: no  Less interest in hobbies / activities: no  Repeats questions, stories (family complaining): no  Trouble using ordinary gadgets (microwave, computer, phone):no  Forgets the month or year: no  Mismanaging finances: no  Remembering appts: no  Daily problems with thinking and/or memory: no Ad8 score is= 0  Immunization History    Administered Date(s) Administered  . Influenza, High Dose Seasonal PF 05/15/2015, 05/20/2016, 02/27/2017  . Influenza, Seasonal, Injecte, Preservative Fre 08/12/2012  . Influenza,inj,Quad PF,6+ Mos 02/11/2013, 05/12/2014  . Pneumococcal Conjugate-13 05/15/2015  . Pneumococcal Polysaccharide-23 06/10/2010, 11/19/2016  . Zoster 10/09/2010   Screening Tests Health Maintenance  Topic Date Due  . OPHTHALMOLOGY EXAM  11/28/1960  . MAMMOGRAM  09/03/2015  . HEMOGLOBIN A1C  08/27/2017  . FOOT EXAM  05/21/2018  . DEXA SCAN  06/24/2018  . COLONOSCOPY  10/21/2018  . TETANUS/TDAP  10/21/2020  . INFLUENZA VACCINE  Completed  . Hepatitis C Screening  Completed  . PNA vac Low Risk Adult  Completed       Plan:     Continue doing brain stimulating activities (puzzles, reading, adult coloring books, staying active) to keep memory sharp.   Continue to eat heart healthy diet (full of fruits, vegetables, whole grains, lean protein, water--limit salt, fat, and sugar intake) and increase physical activity as tolerated.  I have personally reviewed and noted the following in the patient's chart:   . Medical and social history . Use of alcohol, tobacco or illicit drugs  . Current medications and supplements . Functional ability and status . Nutritional status . Physical activity . Advanced directives . List of other physicians . Vitals . Screenings to include cognitive, depression, and falls . Referrals and appointments  In addition, I have reviewed and discussed with patient certain preventive protocols, quality metrics, and best practice recommendations. A written personalized care plan for preventive services as well as general preventive health recommendations were provided to patient.     Michiel Cowboy, RN  05/21/2017   Medical screening examination/treatment/procedure(s) were performed by non-physician practitioner and as supervising physician I was immediately available for  consultation/collaboration. I agree with above. Binnie Rail, MD

## 2017-05-21 NOTE — Assessment & Plan Note (Signed)
Did not tolerate pravastatin secondary to muscle aches Currently not on medication Check lipid panel, CMP Increase activity and work on weight loss She states she would consider medication if it was needed, but would prefer not to take 1

## 2017-05-21 NOTE — Assessment & Plan Note (Signed)
Last A1c improved to 6.2% Did not tolerate metformin Diabetes currently diet controlled We will check A1c today Has eye appointment scheduled Discussed importance of keeping sugars well controlled Encouraged increasing exercise and working on weight loss Follow-up in 6 months

## 2017-06-04 LAB — HM DIABETES EYE EXAM

## 2017-06-12 ENCOUNTER — Encounter: Payer: Self-pay | Admitting: Internal Medicine

## 2017-09-18 DIAGNOSIS — L668 Other cicatricial alopecia: Secondary | ICD-10-CM | POA: Diagnosis not present

## 2017-09-18 DIAGNOSIS — L298 Other pruritus: Secondary | ICD-10-CM | POA: Diagnosis not present

## 2017-09-18 DIAGNOSIS — L309 Dermatitis, unspecified: Secondary | ICD-10-CM | POA: Diagnosis not present

## 2017-10-02 DIAGNOSIS — Z01419 Encounter for gynecological examination (general) (routine) without abnormal findings: Secondary | ICD-10-CM | POA: Diagnosis not present

## 2017-10-02 DIAGNOSIS — Z124 Encounter for screening for malignant neoplasm of cervix: Secondary | ICD-10-CM | POA: Diagnosis not present

## 2017-10-02 DIAGNOSIS — Z1231 Encounter for screening mammogram for malignant neoplasm of breast: Secondary | ICD-10-CM | POA: Diagnosis not present

## 2017-10-02 LAB — HM PAP SMEAR: HM Pap smear: NORMAL

## 2017-10-15 IMAGING — US US THYROID
1 series · 13 of 25 positions shown · non-contrast
Comparison: 10/13/2014 and previous

CLINICAL DATA: Multinodular goiter . Previous FNA biopsy of mid
right and isthmic nodules 01/01/2012

EXAM:
THYROID ULTRASOUND
TECHNIQUE: Ultrasound examination of the thyroid gland and adjacent soft
tissues was performed.

[Series 1: us thyroid · 0.08mm/px · 13 of 40 slices shown]
[im 1/40]
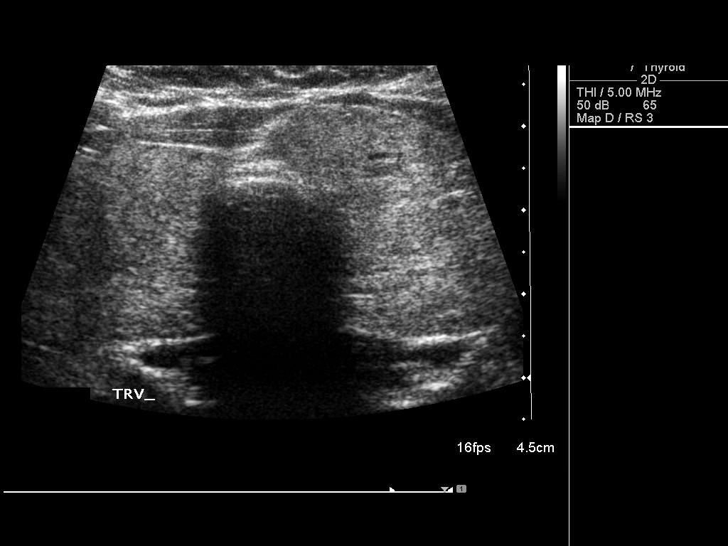
[im 4/40]
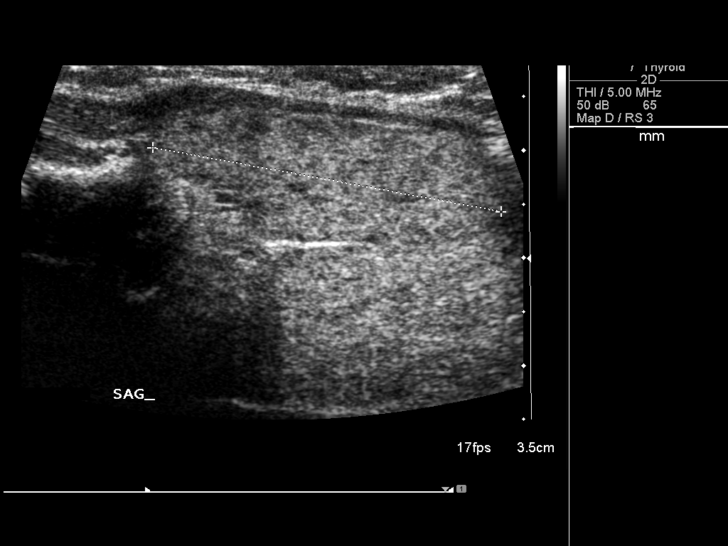
[im 7/40]
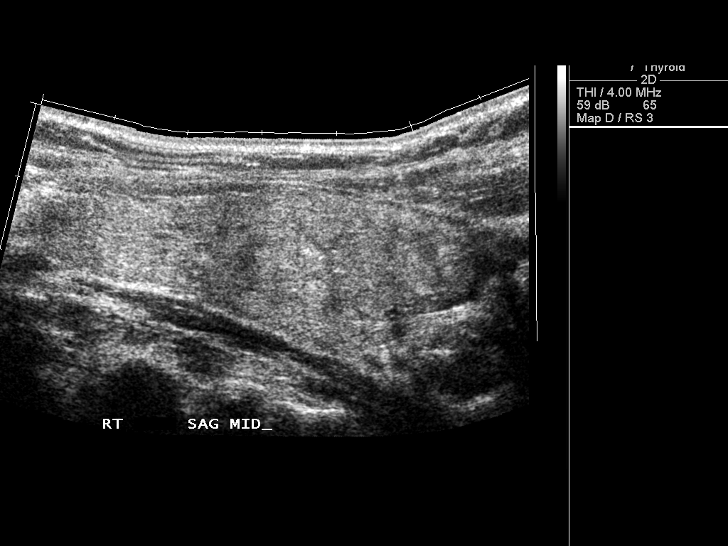
[im 10/40]
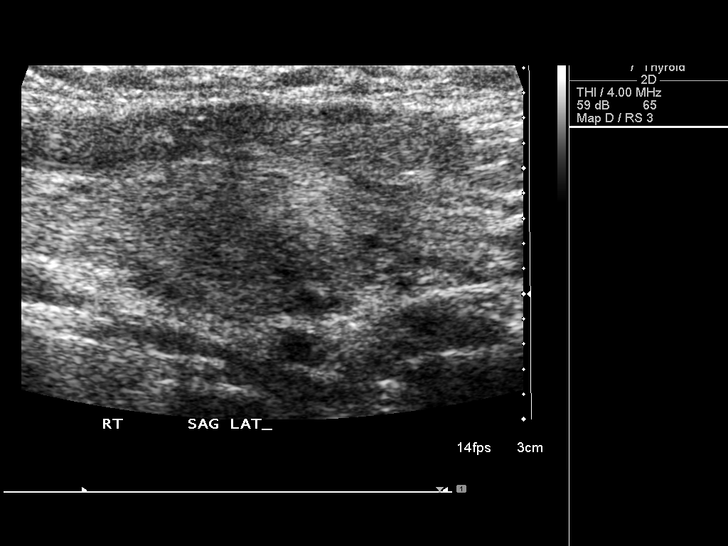
[im 14/40]
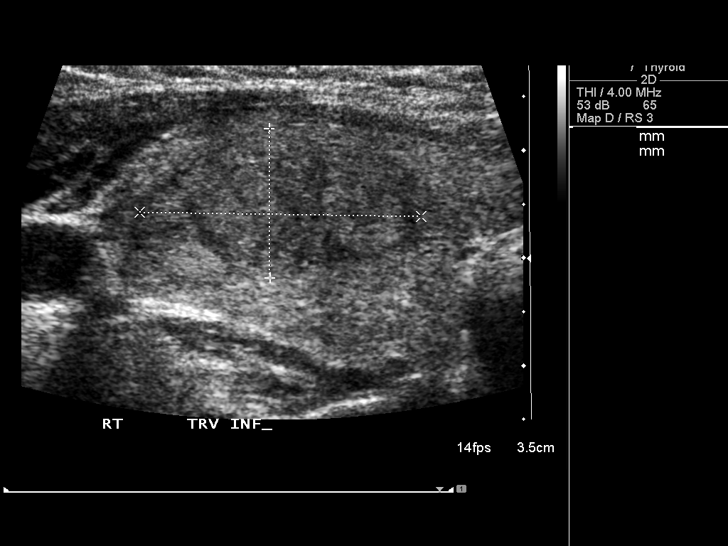
[im 17/40]
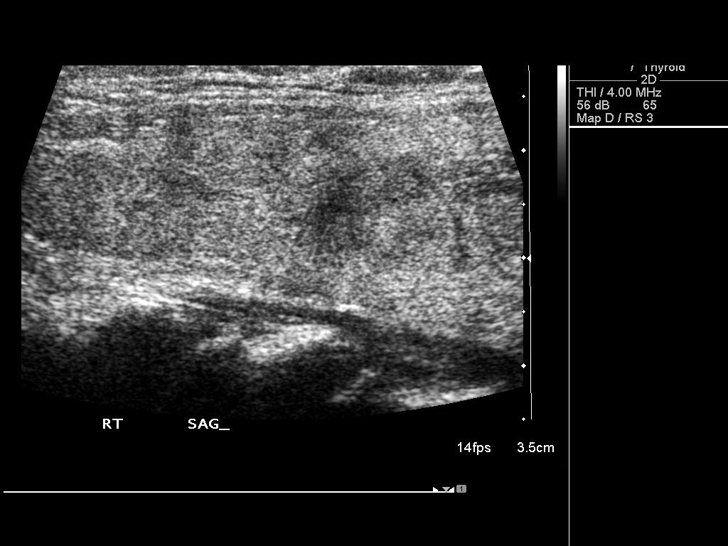
[im 20/40]
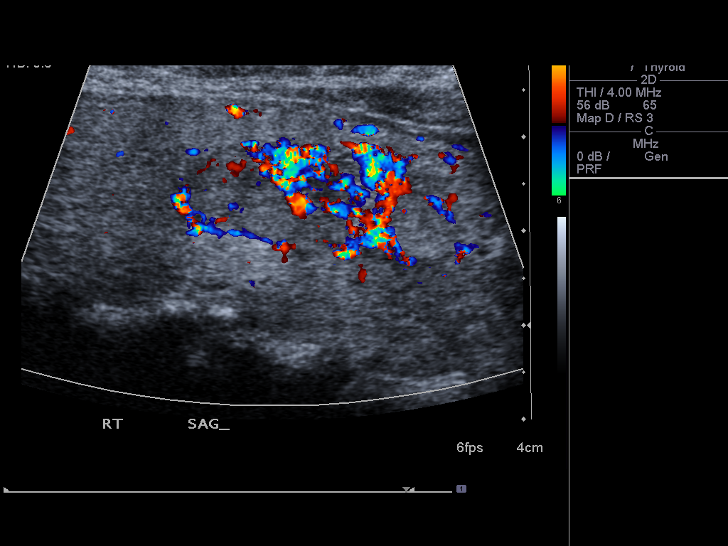
[im 23/40]
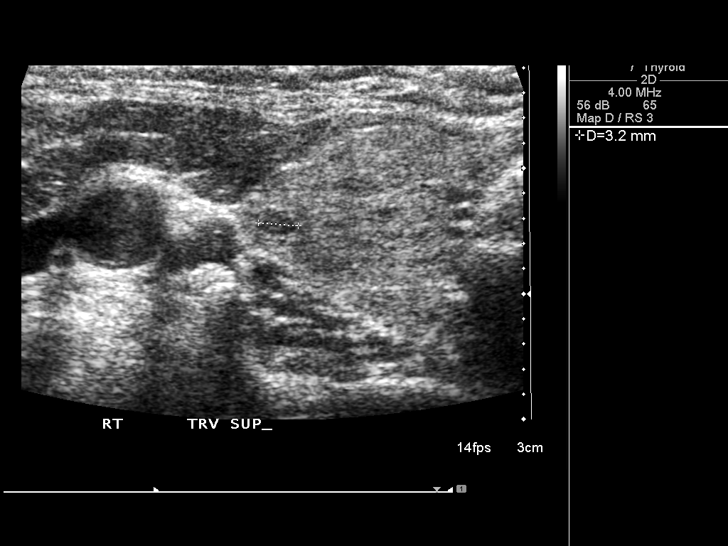
[im 27/40]
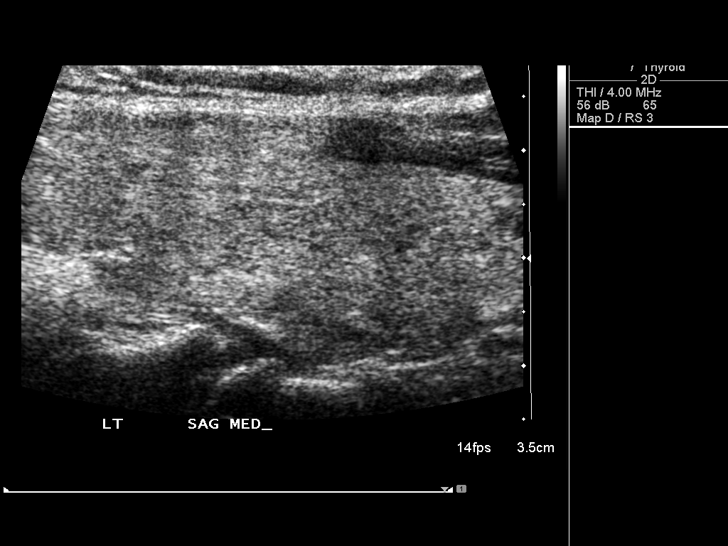
[im 30/40]
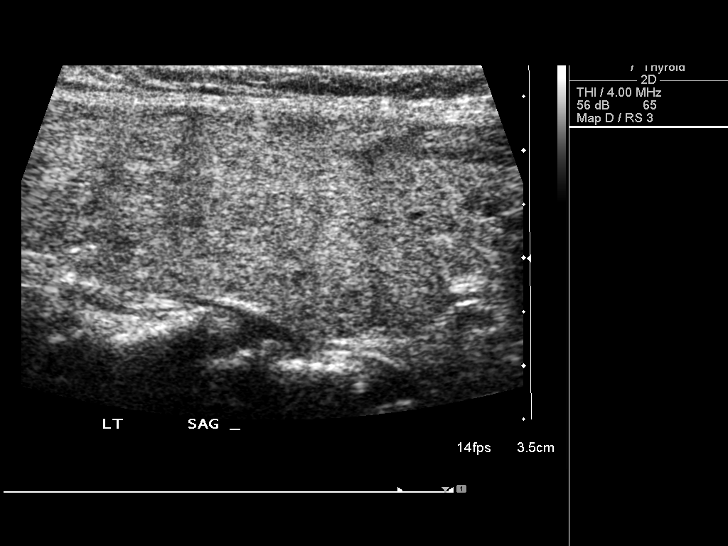
[im 33/40]
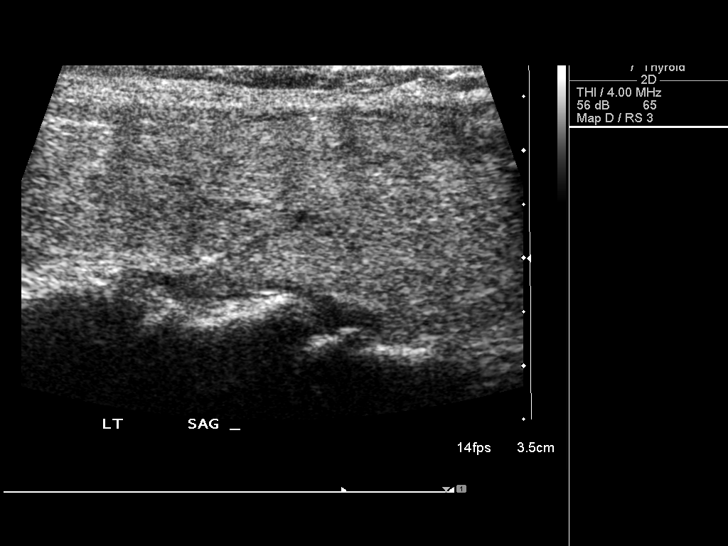
[im 36/40]
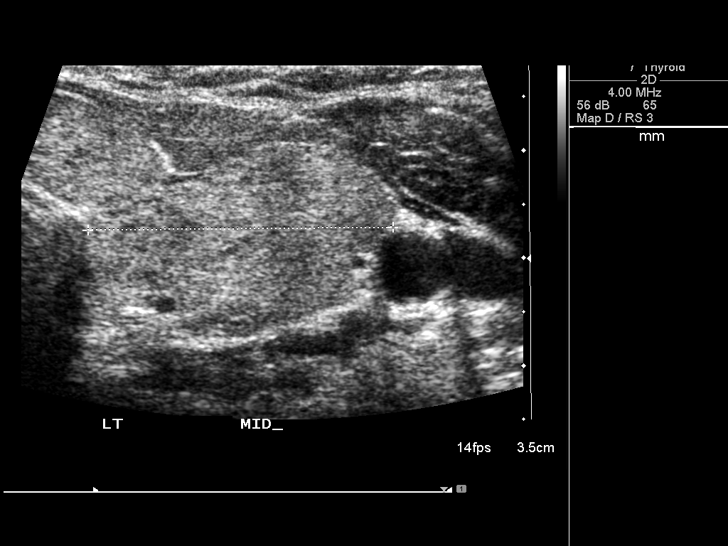
[im 40/40]
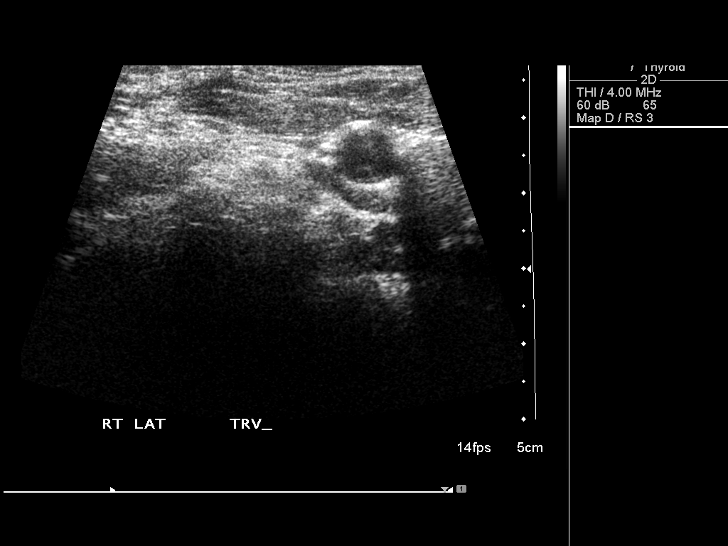

[13 of 25 positions shown; findings below may reference images not displayed]

FINDINGS: Parenchymal Echotexture: Mildly heterogenous

Estimated total number of nodules >/= 1 cm: 2

Number of spongiform nodules >/=  2 cm not described below (TR1): 0

Number of mixed cystic and solid nodules >/= 1.5 cm not described
below (TR2): 0

_________________________________________________________

Isthmus: 0.4 cm, stable

Nodule # 1:

Location: Isthmus; left of midline

Size: 3.3 x 1.2 x 2.4 cm (previously 2.2 cm)

Composition: solid/almost completely solid (2)

Echogenicity: isoechoic (1)

Shape: not taller-than-wide (0)

Margins: ill-defined (0)

Echogenic foci: none (0)

ACR TI-RADS total points: 3.

ACR TI-RADS risk category: TR3 (3 points).

ACR TI-RADS recommendations:

**Given size (>/= 2.5 cm) and appearance, fine needle aspiration of
this mildly suspicious nodule should be considered based on TI-RADS
criteria.

Note: Biopsy was already performed, reported consistent with a
thyroiditis ([place]).

_________________________________________________________

Right lobe: 7.2 x 2.9 x 3.2 cm (previously 6.7 x 2.5 x 2.8)

Nodule # 1:

Location: Right; Inferior

Size: 3.5 x 1.4 x 2.6 cm (previously 3.2 x 1.1 x 1.4)

Composition: solid/almost completely solid (2)

Echogenicity: isoechoic (1)

Shape: not taller-than-wide (0)

Margins: smooth (0)

Echogenic foci: none (0)

ACR TI-RADS total points: 3.

ACR TI-RADS risk category: TR3 (3 points).

ACR TI-RADS recommendations:

**Given size (>/= 2.5 cm) and appearance, fine needle aspiration of
this mildly suspicious nodule should be considered based on TI-RADS
criteria.

Note: Biopsy was already performed, reported follicular lesion with
a few lymphocytes, features favor hyperplastic, component of
thyroiditis also a consideration.

_________________________________________________________

Left lobe: 7.2 x 2.5 x 2.8 cm (previously 6.7 x 2 x 2.7)

No discrete nodules are identified within the left lobe of the
thyroid.
IMPRESSION: 1. Thyromegaly with stable inferior right and left isthmic nodules.
Diagnostic FNA biopsy previously performed as above. No specific
imaging follow-up is recommended.

The above is in keeping with the ACR TI-RADS recommendations - [HOSPITAL] 8701;[DATE].

## 2017-11-18 NOTE — Patient Instructions (Addendum)

## 2017-11-18 NOTE — Progress Notes (Signed)
Subjective:    Patient ID: Frances Gray, female    DOB: 1951/05/19, 67 y.o.   MRN: [id number]  HPI The patient is here for follow up.  She has had some increased stress from helping her mom and other family issues.    She has noticed some urinary urgency, but denies other urinary symptoms.   Hypertension: She is taking her medication daily. She is compliant with a low sodium diet.  She denies chest pain, palpitations, edema, shortness of breath and regular headaches. She is not exercising regularly.  She does not monitor her blood pressure at home.    Diabetes: her sugars are diet controlled. She is compliant with a diabetic diet. She is not exercising regularly.  She checks her feet daily and denies foot lesions. She is up-to-date with an ophthalmology examination.   Hyperlipidemia: She is not on any medication.  She did not tolerate pravastatin due to muscle aches. She is compliant with a low fat/cholesterol diet. She is not exercising regularly.     Medications and allergies reviewed with patient and updated if appropriate.  Patient Active Problem List   Diagnosis Date Noted  . Plantar fasciitis, right 12/05/2016  . Diabetes mellitus without complication ([place]) 78/46/9629  . Nodule of flexor tendon sheath 01/23/2016  . Midline low back pain without sciatica 01/23/2016  . Knee osteoarthritis 12/06/2014  . Hyperlipidemia   . Osteopenia 12/29/2013  . Severe obesity (BMI >= 40) ([place]) 11/16/2012  . 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 Anemia, unspecified 08/14/2011  . Vitamin D deficiency disease 08/14/2011  . Kidney stone 07/03/2011  . GERD (gastroesophageal reflux disease)   . Hypertension     Current Outpatient Medications on File Prior to Visit  Medication Sig Dispense Refill  . calcium carbonate (CALCIUM 600) 600 MG TABS tablet Take 1 tablet (600 mg total) by mouth 2 (two) times daily with a meal. 60 tablet 6  . Cholecalciferol (VITAMIN D3) 2000 UNITS TABS  Take 2,000 Units by mouth every morning.     . fluticasone (FLONASE) 50 MCG/ACT nasal spray Place 1 spray into both nostrils daily as needed for allergies or rhinitis.   0  . losartan-hydrochlorothiazide (HYZAAR) 100-25 MG tablet Take 1 tablet daily by mouth. 90 tablet 1  . Multiple Vitamin (MULTIVITAMIN) tablet Take 1 tablet by mouth every morning.     . sennosides-docusate sodium (SENOKOT-S) 8.6-50 MG tablet Take 2 tablets by mouth daily. (Patient taking differently: Take 1 tablet by mouth daily. ) 30 tablet 1   No current facility-administered medications on file prior to visit.     Past Medical History:  Diagnosis Date  . Allergic rhinitis, seasonal    takes Claritin daily as needed and uses Flonase if needed  . 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 Diverticulitis   . GERD (gastroesophageal reflux disease)    doesn't take any meds  . Heart murmur    no symptoms  . History of bronchitis winter of 2014  . History of colon polyps    benign  . History of kidney stones   . Hyperlipidemia   . Hypertension    takes Hyzaar daily  . Vitamin D deficiency disease 08/14/2011   takes Vit D daily    Past Surgical History:  Procedure Laterality Date  . BREAST BIOPSY Left   . Breast ultrasound Right 03/02/13   The 85mm complex cyst in the right breast is probably benign. Follow-up  in 6 months with bilateral mammogram and (R) ultrasound  . Breast Ultrasound Right 09/02/13   There is a benign 3 mm complex cyst in the (R) breast at 9 o'clock  . COLONOSCOPY    . ESOPHAGUS SURGERY  1996   tumor removed   (benign)  . KNEE ARTHROSCOPY     bilateral  . LITHOTRIPSY  08/2011   R ureteral stone  . TOTAL KNEE ARTHROPLASTY Left 11/16/2012   Procedure: TOTAL KNEE ARTHROPLASTY- left;  Surgeon: Johnny Bridge, MD;  Location: [facility];  Service: Orthopedics;  Laterality: Left;  . TOTAL KNEE ARTHROPLASTY Right 12/06/2014   Procedure: TOTAL KNEE ARTHROPLASTY;  Surgeon: Marchia Bond, MD;   Location: [facility];  Service: Orthopedics;  Laterality: Right;    Social History   Socioeconomic History  . Marital status: Married    Spouse name: Not on file  . Number of children: Not on file  . Years of education: Not on file  . Highest education level: Not on file  Occupational History  . Not on file  Social Needs  . Financial resource strain: Not hard at all  . Food insecurity:    Worry: Never true    Inability: Never true  . Transportation needs:    Medical: No    Non-medical: No  Tobacco Use  . Smoking status: Former Smoker    Packs/day: 0.25    Years: 10.00    Pack years: 2.50  . Smokeless tobacco: Never Used  . Tobacco comment: quit smoking 60yrs ago  Substance and Sexual Activity  . Alcohol use: No  . Drug use: No  . Sexual activity: Yes    Birth control/protection: Post-menopausal  Lifestyle  . Physical activity:    Days per week: 0 days    Minutes per session: 0 min  . Stress: Rather much  Relationships  . Social connections:    Talks on phone: More than three times a week    Gets together: More than three times a week    Attends religious service: More than 4 times per year    Active member of club or organization: Not on file    Attends meetings of clubs or organizations: More than 4 times per year    Relationship status: Married  Other Topics Concern  . Not on file  Social History Narrative   Lives with spouse and g-son, temporarily mother in law (05/2014)    Family History  Problem Relation Age of Onset  . Ovarian cancer Mother   . Colon cancer Sister 12  . Breast cancer Sister   . Esophageal cancer Neg Hx     Review of Systems  Constitutional: Negative for chills and fever.  Respiratory: Negative for cough, shortness of breath and wheezing.   Cardiovascular: Negative for chest pain, palpitations and leg swelling.  Gastrointestinal: Negative for abdominal pain and nausea.  Endocrine: Negative for polydipsia and polyuria.  Genitourinary:  Positive for urgency. Negative for dysuria and hematuria.  Neurological: Negative for light-headedness and headaches.       Objective:   Vitals:   11/19/17 1001  BP: 130/78  Pulse: (!) 57  Resp: 16  Temp: 97.6 F (36.4 C)  SpO2: 97%   BP Readings from Last 3 Encounters:  11/19/17 130/78  05/21/17 132/78  02/27/17 136/80   Wt Readings from Last 3 Encounters:  11/19/17 269 lb (122 kg)  05/21/17 276 lb (125.2 kg)  02/27/17 279 lb (126.6 kg)   Body mass index is  46.17 kg/m.   Physical Exam    Constitutional: Appears well-developed and well-nourished. No distress.  HENT:  Head: Normocephalic and atraumatic.  Neck: Neck supple. No tracheal deviation present. No thyromegaly present.  No cervical lymphadenopathy Cardiovascular: Normal rate, regular rhythm and normal heart sounds.   No murmur heard. No carotid bruit .  No edema Pulmonary/Chest: Effort normal and breath sounds normal. No respiratory distress. No has no wheezes. No rales.  Skin: Skin is warm and dry. Not diaphoretic.  Psychiatric: Normal mood and affect. Behavior is normal.      Assessment & Plan:    See Problem List for Assessment and Plan of chronic medical problems.

## 2017-11-19 ENCOUNTER — Encounter: Payer: Self-pay | Admitting: Internal Medicine

## 2017-11-19 ENCOUNTER — Other Ambulatory Visit (INDEPENDENT_AMBULATORY_CARE_PROVIDER_SITE_OTHER): Payer: Medicare Other

## 2017-11-19 ENCOUNTER — Ambulatory Visit (INDEPENDENT_AMBULATORY_CARE_PROVIDER_SITE_OTHER): Payer: Medicare Other | Admitting: Internal Medicine

## 2017-11-19 VITALS — BP 130/78 | HR 57 | Temp 97.6°F | Resp 16 | Wt 269.0 lb

## 2017-11-19 DIAGNOSIS — I10 Essential (primary) hypertension: Secondary | ICD-10-CM

## 2017-11-19 DIAGNOSIS — E7849 Other hyperlipidemia: Secondary | ICD-10-CM | POA: Diagnosis not present

## 2017-11-19 DIAGNOSIS — E119 Type 2 diabetes mellitus without complications: Secondary | ICD-10-CM

## 2017-11-19 LAB — CBC WITH DIFFERENTIAL/PLATELET
Basophils Absolute: 0.1 10*3/uL (ref 0.0–0.1)
Basophils Relative: 0.9 % (ref 0.0–3.0)
Eosinophils Absolute: 0.2 10*3/uL (ref 0.0–0.7)
Eosinophils Relative: 3.1 % (ref 0.0–5.0)
HCT: 39.1 % (ref 36.0–46.0)
Hemoglobin: 13.2 g/dL (ref 12.0–15.0)
LYMPHS ABS: 1.4 10*3/uL (ref 0.7–4.0)
Lymphocytes Relative: 23.8 % (ref 12.0–46.0)
MCHC: 33.7 g/dL (ref 30.0–36.0)
MCV: 84.9 fl (ref 78.0–100.0)
MONO ABS: 0.7 10*3/uL (ref 0.1–1.0)
MONOS PCT: 12.4 % — AB (ref 3.0–12.0)
NEUTROS PCT: 59.8 % (ref 43.0–77.0)
Neutro Abs: 3.5 10*3/uL (ref 1.4–7.7)
Platelets: 374 10*3/uL (ref 150.0–400.0)
RBC: 4.61 Mil/uL (ref 3.87–5.11)
RDW: 13.1 % (ref 11.5–15.5)
WBC: 5.9 10*3/uL (ref 4.0–10.5)

## 2017-11-19 LAB — COMPREHENSIVE METABOLIC PANEL
ALT: 15 U/L (ref 0–35)
AST: 17 U/L (ref 0–37)
Albumin: 3.8 g/dL (ref 3.5–5.2)
Alkaline Phosphatase: 66 U/L (ref 39–117)
BILIRUBIN TOTAL: 0.3 mg/dL (ref 0.2–1.2)
BUN: 12 mg/dL (ref 6–23)
CO2: 31 meq/L (ref 19–32)
Calcium: 10.1 mg/dL (ref 8.4–10.5)
Chloride: 102 mEq/L (ref 96–112)
Creatinine, Ser: 0.75 mg/dL (ref 0.40–1.20)
GFR: 98.83 mL/min (ref >60.00)
GLUCOSE: 105 mg/dL — AB (ref 70–99)
POTASSIUM: 4.3 meq/L (ref 3.5–5.1)
Sodium: 139 mEq/L (ref 135–145)
Total Protein: 8 g/dL (ref 6.0–8.3)

## 2017-11-19 LAB — LIPID PANEL
Cholesterol: 203 mg/dL — ABNORMAL HIGH (ref 0–200)
HDL: 44.1 mg/dL (ref >39.00)
LDL Cholesterol: 137 mg/dL — ABNORMAL HIGH (ref 0–99)
NONHDL: 158.81
Total CHOL/HDL Ratio: 5
Triglycerides: 110 mg/dL (ref 0.0–149.0)
VLDL: 22 mg/dL (ref 0.0–40.0)

## 2017-11-19 LAB — HEMOGLOBIN A1C: HEMOGLOBIN A1C: 6.4 % (ref 4.6–6.5)

## 2017-11-19 MED ORDER — LOSARTAN POTASSIUM-HCTZ 100-25 MG PO TABS: 1.0000 | ORAL_TABLET | Freq: Every day | ORAL | 3 refills | Status: DC

## 2017-11-19 MED ORDER — SENNA-DOCUSATE SODIUM 8.6-50 MG PO TABS: 1.0000 | ORAL_TABLET | Freq: Every day | ORAL | Status: DC

## 2017-11-19 NOTE — Assessment & Plan Note (Addendum)
BP well controlled Current regimen effective and well tolerated Continue current medications at current doses cmp, CBC 

## 2017-11-19 NOTE — Assessment & Plan Note (Addendum)
Diet controlled Compliant with a diabetic diet Not exercising Encouraged regular exercise and weight loss Check A1c

## 2017-11-19 NOTE — Assessment & Plan Note (Signed)
Check lipid panel  Not on medication Would ideally like to avoid medication, but is willing to take something if needed Regular exercise and healthy diet encouraged

## 2017-11-21 ENCOUNTER — Telehealth: Payer: Self-pay | Admitting: Emergency Medicine

## 2017-11-21 NOTE — Telephone Encounter (Signed)
Copied from [facility] [phone]. Topic: Quick Communication - Lab Results >> Nov 21, 2017 10:50 AM Scherrie Gerlach wrote: Pt would like to know what sort of plan she needs to get her results where they need to be.  Does the dr want her to go on cholesterol med? What does she need to do?

## 2017-11-21 NOTE — Telephone Encounter (Signed)
LVM with pt advising her that diet and exercise are the best to reduce her cholesterol right now. meds are not needed at this time.

## 2017-12-02 ENCOUNTER — Encounter: Payer: Self-pay | Admitting: Internal Medicine

## 2018-03-01 ENCOUNTER — Other Ambulatory Visit: Payer: Self-pay

## 2018-03-01 ENCOUNTER — Encounter (HOSPITAL_BASED_OUTPATIENT_CLINIC_OR_DEPARTMENT_OTHER): Payer: Self-pay | Admitting: Emergency Medicine

## 2018-03-01 ENCOUNTER — Emergency Department (HOSPITAL_BASED_OUTPATIENT_CLINIC_OR_DEPARTMENT_OTHER): Payer: Medicare Other

## 2018-03-01 ENCOUNTER — Emergency Department (HOSPITAL_BASED_OUTPATIENT_CLINIC_OR_DEPARTMENT_OTHER)
Admission: EM | Admit: 2018-03-01 | Discharge: 2018-03-01 | Disposition: A | Discharge status: Home / Self Care | Payer: Medicare Other | Attending: Emergency Medicine | Admitting: Emergency Medicine

## 2018-03-01 DIAGNOSIS — I10 Essential (primary) hypertension: Secondary | ICD-10-CM | POA: Diagnosis not present

## 2018-03-01 DIAGNOSIS — R05 Cough: Secondary | ICD-10-CM | POA: Diagnosis not present

## 2018-03-01 DIAGNOSIS — Z79899 Other long term (current) drug therapy: Secondary | ICD-10-CM | POA: Insufficient documentation

## 2018-03-01 DIAGNOSIS — Z96653 Presence of artificial knee joint, bilateral: Secondary | ICD-10-CM | POA: Diagnosis not present

## 2018-03-01 DIAGNOSIS — Z87891 Personal history of nicotine dependence: Secondary | ICD-10-CM | POA: Diagnosis not present

## 2018-03-01 DIAGNOSIS — J40 Bronchitis, not specified as acute or chronic: Secondary | ICD-10-CM | POA: Diagnosis not present

## 2018-03-01 DIAGNOSIS — E119 Type 2 diabetes mellitus without complications: Secondary | ICD-10-CM | POA: Diagnosis not present

## 2018-03-01 MED ORDER — HYDROCODONE-HOMATROPINE 5-1.5 MG/5ML PO SYRP: 5.0000 mL | ORAL_SOLUTION | Freq: Four times a day (QID) | ORAL | 0 refills | Status: DC | PRN

## 2018-03-01 MED ORDER — AZITHROMYCIN 250 MG PO TABS: 250.0000 mg | ORAL_TABLET | Freq: Every day | ORAL | 0 refills | Status: DC

## 2018-03-01 NOTE — ED Triage Notes (Signed)
Pt reports dry cough for over a week.

## 2018-03-01 NOTE — ED Provider Notes (Signed)
[facility] EMERGENCY DEPARTMENT Provider Note   CSN: [id number] Arrival date & time: 03/01/18  1003     History   Chief Complaint Chief Complaint  Patient presents with  . Cough    HPI Frances Gray is a 67 y.o. female.  The history is provided by the patient.  Cough  This is a new problem. Episode onset: 8 days ago. The problem occurs constantly. The problem has not changed since onset.Cough characteristics: intermittent sputum. There has been no fever. Associated symptoms include rhinorrhea and sore throat. Pertinent negatives include no chills, no sweats, no ear pain, no headaches and no shortness of breath. She has tried decongestants for the symptoms. The treatment provided mild relief. She is not a smoker. Past medical history comments: other family members with URI recently.  hx of bronchitis.    Past Medical History:  Diagnosis Date  . Allergic rhinitis, seasonal    takes Claritin daily as needed and uses Flonase if needed  . 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 Diverticulitis   . GERD (gastroesophageal reflux disease)    doesn't take any meds  . Heart murmur    no symptoms  . History of bronchitis winter of 2014  . History of colon polyps    benign  . History of kidney stones   . Hyperlipidemia   . Hypertension    takes Hyzaar daily  . Vitamin D deficiency disease 08/14/2011   takes Vit D daily    Patient Active Problem List   Diagnosis Date Noted  . Plantar fasciitis, right 12/05/2016  . Diabetes mellitus without complication ([place]) 24/23/5361  . Nodule of flexor tendon sheath 01/23/2016  . Midline low back pain without sciatica 01/23/2016  . Knee osteoarthritis 12/06/2014  . Hyperlipidemia   . Osteopenia 12/29/2013  . Severe obesity (BMI >= 40) ([place]) 11/16/2012  . 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 Anemia, unspecified 08/14/2011  . Vitamin D deficiency disease 08/14/2011  . Kidney stone 07/03/2011  . GERD  (gastroesophageal reflux disease)   . Hypertension     Past Surgical History:  Procedure Laterality Date  . BREAST BIOPSY Left   . Breast ultrasound Right 03/02/13   The 79mm complex cyst in the right breast is probably benign. Follow-up in 6 months with bilateral mammogram and (R) ultrasound  . Breast Ultrasound Right 09/02/13   There is a benign 3 mm complex cyst in the (R) breast at 9 o'clock  . COLONOSCOPY    . ESOPHAGUS SURGERY  1996   tumor removed   (benign)  . KNEE ARTHROSCOPY     bilateral  . LITHOTRIPSY  08/2011   R ureteral stone  . TOTAL KNEE ARTHROPLASTY Left 11/16/2012   Procedure: TOTAL KNEE ARTHROPLASTY- left;  Surgeon: Johnny Bridge, MD;  Location: [facility];  Service: Orthopedics;  Laterality: Left;  . TOTAL KNEE ARTHROPLASTY Right 12/06/2014   Procedure: TOTAL KNEE ARTHROPLASTY;  Surgeon: Marchia Bond, MD;  Location: [facility];  Service: Orthopedics;  Laterality: Right;     OB History   None      Home Medications    Prior to Admission medications   Medication Sig Start Date End Date Taking? Authorizing Provider  calcium carbonate (CALCIUM 600) 600 MG TABS tablet Take 1 tablet (600 mg total) by mouth 2 (two) times daily with a meal. 05/12/14   Rowe Clack, MD  Cholecalciferol (VITAMIN D3) 2000 UNITS TABS Take 2,000 Units  by mouth every morning.     [provider]  fluticasone (FLONASE) 50 MCG/ACT nasal spray Place 1 spray into both nostrils daily as needed for allergies or rhinitis.  10/05/14   [provider]  losartan-hydrochlorothiazide (HYZAAR) 100-25 MG tablet Take 1 tablet by mouth daily. 11/19/17   Binnie Rail, MD  Multiple Vitamin (MULTIVITAMIN) tablet Take 1 tablet by mouth every morning.     [provider]  sennosides-docusate sodium (SENOKOT-S) 8.6-50 MG tablet Take 1 tablet by mouth at bedtime. 11/19/17   Binnie Rail, MD    Family History Family History  Problem Relation Age of Onset  . Ovarian cancer Mother   .  Colon cancer Sister 4  . Breast cancer Sister   . Esophageal cancer Neg Hx     Social History Social History   Tobacco Use  . Smoking status: Former Smoker    Packs/day: 0.25    Years: 10.00    Pack years: 2.50  . Smokeless tobacco: Never Used  . Tobacco comment: quit smoking 48yrs ago  Substance Use Topics  . Alcohol use: No  . Drug use: No     Allergies   Amlodipine; Metformin and related; and Pravastatin   Review of Systems Review of Systems  Constitutional: Negative for chills.  HENT: Positive for rhinorrhea and sore throat. Negative for ear pain.   Respiratory: Positive for cough. Negative for shortness of breath.   Neurological: Negative for headaches.  All other systems reviewed and are negative.    Physical Exam Updated Vital Signs BP (!) 157/84 (BP Location: Left Arm)   Pulse 62   Temp 98 F (36.7 C) (Oral)   Resp 20   Ht 5\' 4"  (1.626 m)   Wt 123.8 kg   SpO2 100%   BMI 46.86 kg/m   Physical Exam  Constitutional: She is oriented to person, place, and time. She appears well-developed and well-nourished. No distress.  HENT:  Head: Normocephalic and atraumatic.  Right Ear: Tympanic membrane normal.  Left Ear: Tympanic membrane normal.  Nose: Mucosal edema present.  Mouth/Throat: Oropharynx is clear and moist and mucous membranes are normal.  Eyes: Pupils are equal, round, and reactive to light. Conjunctivae and EOM are normal.  Neck: Normal range of motion. Neck supple.  Cardiovascular: Normal rate, regular rhythm and intact distal pulses.  No murmur heard. Pulmonary/Chest: Effort normal. No respiratory distress. She has no wheezes. She has no rales.  Mild diffuse decreased breath sounds  Abdominal: Soft. She exhibits no distension. There is no tenderness. There is no rebound and no guarding.  Musculoskeletal: Normal range of motion. She exhibits no edema or tenderness.  Neurological: She is alert and oriented to person, place, and time.  Skin:  Skin is warm and dry. No rash noted. No erythema.  Psychiatric: She has a normal mood and affect. Her behavior is normal.  Nursing note and vitals reviewed.    ED Treatments / Results  Labs (all labs ordered are listed, but only abnormal results are displayed) Labs Reviewed - No data to display  EKG None  Radiology Dg Chest 2 View  Result Date: 03/01/2018 CLINICAL DATA:  Cough, fever EXAM: CHEST - 2 VIEW COMPARISON:  11/10/2012 FINDINGS: Lungs are clear.  No pleural effusion or pneumothorax. The heart is normal in size. Degenerative changes of the visualized thoracolumbar spine. IMPRESSION: Normal chest radiographs. Electronically Signed   By: Julian Hy M.D.   On: 03/01/2018 10:44    Procedures Procedures (including  critical care time)  Medications Ordered in ED Medications - No data to display   Initial Impression / Assessment and Plan / ED Course  I have reviewed the triage vital signs and the nursing notes.  Pertinent labs & imaging results that were available during my care of the patient were reviewed by me and considered in my medical decision making (see chart for details).    Pt with symptoms consistent with viral URI but now with persistent cough for the last 8 days.  Well appearing here.  No signs of breathing difficulty  No signs of pharyngitis, otitis or abnormal abdominal findings.  No prior respiratory hx except for bronchitis.  Does not use an inhaler regularly and no wheezing today. CXR wnl and pt to return with any further problems.   Final Clinical Impressions(s) / ED Diagnoses   Final diagnoses:  Bronchitis    ED Discharge Orders         Ordered    azithromycin (ZITHROMAX) 250 MG tablet  Daily     03/01/18 1103    HYDROcodone-homatropine (HYCODAN) 5-1.5 MG/5ML syrup  Every 6 hours PRN     03/01/18 1103           Blanchie Dessert, MD 03/01/18 1104

## 2018-04-29 ENCOUNTER — Other Ambulatory Visit: Payer: Self-pay

## 2018-05-25 NOTE — Progress Notes (Signed)
Subjective:    Patient ID: Frances Gray, female    DOB: [birth year], 67 y.o.   MRN: [id number]  HPI The patient is here for follow up.  Diabetes: She is controlling her sugars with diet. She is compliant with a diabetic diet. She is not exercising regularly.  She checks her feet daily and denies foot lesions. She is up-to-date with an ophthalmology examination.   Hypertension: She is taking her medication daily. She is compliant with a low sodium diet.  She denies chest pain, palpitations, edema, shortness of breath and regular headaches. She is not exercising regularly.  She does not monitor her blood pressure at home.    Hyperlipidemia: She is now on medication and trying to control her cholesterol with lifestyle. She is compliant with a low fat/cholesterol diet. She is not exercising regularly.     Sometimes she has lower back pain.  Sitting up straight often makes it feel better.  Aleve typically helps.  Obesity: She is not currently exercising.  She is compliant with a low sugar/carbohydrate diet.  Medications and allergies reviewed with patient and updated if appropriate.  Patient Active Problem List   Diagnosis Date Noted  . Plantar fasciitis, right 12/05/2016  . Diabetes mellitus without complication ([place]) 40/03/2724  . Nodule of flexor tendon sheath 01/23/2016  . Midline low back pain without sciatica 01/23/2016  . Knee osteoarthritis 12/06/2014  . Hyperlipidemia   . Osteopenia 12/29/2013  . Severe obesity (BMI >= 40) ([place]) 11/16/2012  . 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 Anemia, unspecified 08/14/2011  . Vitamin D deficiency disease 08/14/2011  . Kidney stone 07/03/2011  . GERD (gastroesophageal reflux disease)   . Hypertension     Current Outpatient Medications on File Prior to Visit  Medication Sig Dispense Refill  . calcium carbonate (CALCIUM 600) 600 MG TABS tablet Take 1 tablet (600 mg total) by mouth 2 (two) times daily with a meal. 60 tablet 6    . Cholecalciferol (VITAMIN D3) 2000 UNITS TABS Take 2,000 Units by mouth every morning.     . fluticasone (FLONASE) 50 MCG/ACT nasal spray Place 1 spray into both nostrils daily as needed for allergies or rhinitis.   0  . losartan-hydrochlorothiazide (HYZAAR) 100-25 MG tablet Take 1 tablet by mouth daily. 90 tablet 3  . Multiple Vitamin (MULTIVITAMIN) tablet Take 1 tablet by mouth every morning.     . sennosides-docusate sodium (SENOKOT-S) 8.6-50 MG tablet Take 1 tablet by mouth at bedtime.     No current facility-administered medications on file prior to visit.     Past Medical History:  Diagnosis Date  . Allergic rhinitis, seasonal    takes Claritin daily as needed and uses Flonase if needed  . 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 Diverticulitis   . GERD (gastroesophageal reflux disease)    doesn't take any meds  . Heart murmur    no symptoms  . History of bronchitis winter of 2014  . History of colon polyps    benign  . History of kidney stones   . Hyperlipidemia   . Hypertension    takes Hyzaar daily  . Vitamin D deficiency disease 08/14/2011   takes Vit D daily    Past Surgical History:  Procedure Laterality Date  . BREAST BIOPSY Left   . Breast ultrasound Right 03/02/13   The 22mm complex cyst in the right breast is probably benign. Follow-up in 6 months with  bilateral mammogram and (R) ultrasound  . Breast Ultrasound Right 09/02/13   There is a benign 3 mm complex cyst in the (R) breast at 9 o'clock  . COLONOSCOPY    . ESOPHAGUS SURGERY  1996   tumor removed   (benign)  . KNEE ARTHROSCOPY     bilateral  . LITHOTRIPSY  08/2011   R ureteral stone  . TOTAL KNEE ARTHROPLASTY Left 11/16/2012   Procedure: TOTAL KNEE ARTHROPLASTY- left;  Surgeon: Johnny Bridge, MD;  Location: [facility];  Service: Orthopedics;  Laterality: Left;  . TOTAL KNEE ARTHROPLASTY Right 12/06/2014   Procedure: TOTAL KNEE ARTHROPLASTY;  Surgeon: Marchia Bond, MD;  Location: [facility];   Service: Orthopedics;  Laterality: Right;    Social History   Socioeconomic History  . Marital status: Married    Spouse name: Not on file  . Number of children: Not on file  . Years of education: Not on file  . Highest education level: Not on file  Occupational History  . Not on file  Social Needs  . Financial resource strain: Not hard at all  . Food insecurity:    Worry: Never true    Inability: Never true  . Transportation needs:    Medical: No    Non-medical: No  Tobacco Use  . Smoking status: Former Smoker    Packs/day: 0.25    Years: 10.00    Pack years: 2.50  . Smokeless tobacco: Never Used  . Tobacco comment: quit smoking 30yrs ago  Substance and Sexual Activity  . Alcohol use: No  . Drug use: No  . Sexual activity: Yes    Birth control/protection: Post-menopausal  Lifestyle  . Physical activity:    Days per week: 0 days    Minutes per session: 0 min  . Stress: Rather much  Relationships  . Social connections:    Talks on phone: More than three times a week    Gets together: More than three times a week    Attends religious service: More than 4 times per year    Active member of club or organization: Not on file    Attends meetings of clubs or organizations: More than 4 times per year    Relationship status: Married  Other Topics Concern  . Not on file  Social History Narrative   Lives with spouse and g-son, temporarily mother in law (05/2014)    Family History  Problem Relation Age of Onset  . Ovarian cancer Mother   . Colon cancer Sister 77  . Breast cancer Sister   . Esophageal cancer Neg Hx     Review of Systems  Constitutional: Negative for chills and fever.  Respiratory: Negative for cough, wheezing and stridor.   Cardiovascular: Negative for chest pain, palpitations and leg swelling.  Musculoskeletal: Positive for back pain (occasional).  Neurological: Negative for light-headedness and headaches.       Objective:   Vitals:    05/26/18 1052  BP: 126/82  Pulse: 61  Resp: 16  Temp: 97.8 F (36.6 C)  SpO2: 99%   BP Readings from Last 3 Encounters:  05/26/18 126/82  05/26/18 126/82  03/01/18 (!) 157/84   Wt Readings from Last 3 Encounters:  05/26/18 271 lb (122.9 kg)  05/26/18 271 lb (122.9 kg)  03/01/18 273 lb (123.8 kg)   Body mass index is 46.52 kg/m.   Physical Exam    Constitutional: Appears well-developed and well-nourished. No distress.  HENT:  Head: Normocephalic and atraumatic.  Neck: Neck supple. No tracheal deviation present. No thyromegaly present.  No cervical lymphadenopathy Cardiovascular: Normal rate, regular rhythm and normal heart sounds.   No murmur heard. No carotid bruit .  No edema Pulmonary/Chest: Effort normal and breath sounds normal. No respiratory distress. No has no wheezes. No rales.  Skin: Skin is warm and dry. Not diaphoretic.  Psychiatric: Normal mood and affect. Behavior is normal.      Assessment & Plan:    See Problem List for Assessment and Plan of chronic medical problems.

## 2018-05-25 NOTE — Assessment & Plan Note (Addendum)
Diet controlled Check A1c Stressed the importance of regular exercise Stressed compliance with a diabetic diet

## 2018-05-25 NOTE — Progress Notes (Addendum)
Subjective:   Frances Gray is a 67 y.o. female who presents for Medicare Annual (Subsequent) preventive examination.  Review of Systems:  No ROS.  Medicare Wellness Visit. Additional risk factors are reflected in the social history.  Cardiac Risk Factors include: advanced age (>75men, >77 women);dyslipidemia;hypertension;obesity (BMI >30kg/m2) Sleep patterns: gets up 1-2 times nightly to void and sleeps 5-6 hours nightly.  Patient reports insomnia issues, discussed recommended sleep tips and stress reduction tips.   Home Safety/Smoke Alarms: Feels safe in home. Smoke alarms in place.  Living environment; residence and Firearm Safety: 1-story house/ trailer, no firearms. Lives with family, no needs for DME, good support system Seat Belt Safety/Bike Helmet: Wears seat belt.     Objective:     Vitals: BP 126/82   Pulse 61   Temp 97.8 F (36.6 C)   Resp 18   Ht 5\' 4"  (1.626 m)   Wt 271 lb (122.9 kg)   SpO2 99%   BMI 46.52 kg/m   Body mass index is 46.52 kg/m.  Advanced Directives 05/26/2018 03/01/2018 05/21/2017 11/24/2014 05/12/2014 11/19/2012 11/10/2012  Does Patient Have a Medical Advance Directive? No No No No No Patient does not have advance directive;Patient would like information Patient does not have advance directive;Patient would like information  Would patient like information on creating a medical advance directive? No - Patient declined - Yes (ED - Information included in AVS) Yes - Educational materials given Yes - Scientist, clinical (histocompatibility and immunogenetics) given Advance directive packet given Advance directive packet given  Pre-existing out of facility DNR order (yellow form or pink MOST form) - - - - - No -    Tobacco Social History   Tobacco Use  Smoking Status Former Smoker  . Packs/day: 0.25  . Years: 10.00  . Pack years: 2.50  Smokeless Tobacco Never Used  Tobacco Comment   quit smoking 60yrs ago     Counseling given: Not Answered Comment: quit smoking 15yrs ago  Past  Medical History:  Diagnosis Date  . Allergic rhinitis, seasonal    takes Claritin daily as needed and uses Flonase if needed  . 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 Diverticulitis   . GERD (gastroesophageal reflux disease)    doesn't take any meds  . Heart murmur    no symptoms  . History of bronchitis winter of 2014  . History of colon polyps    benign  . History of kidney stones   . Hyperlipidemia   . Hypertension    takes Hyzaar daily  . Vitamin D deficiency disease 08/14/2011   takes Vit D daily   Past Surgical History:  Procedure Laterality Date  . BREAST BIOPSY Left   . Breast ultrasound Right 03/02/13   The 57mm complex cyst in the right breast is probably benign. Follow-up in 6 months with bilateral mammogram and (R) ultrasound  . Breast Ultrasound Right 09/02/13   There is a benign 3 mm complex cyst in the (R) breast at 9 o'clock  . COLONOSCOPY    . ESOPHAGUS SURGERY  1996   tumor removed   (benign)  . KNEE ARTHROSCOPY     bilateral  . LITHOTRIPSY  08/2011   R ureteral stone  . TOTAL KNEE ARTHROPLASTY Left 11/16/2012   Procedure: TOTAL KNEE ARTHROPLASTY- left;  Surgeon: Johnny Bridge, MD;  Location: [facility];  Service: Orthopedics;  Laterality: Left;  . TOTAL KNEE ARTHROPLASTY Right 12/06/2014   Procedure: TOTAL KNEE  ARTHROPLASTY;  Surgeon: Marchia Bond, MD;  Location: [facility];  Service: Orthopedics;  Laterality: Right;   Family History  Problem Relation Age of Onset  . Ovarian cancer Mother   . Colon cancer Sister 28  . Breast cancer Sister   . Esophageal cancer Neg Hx    Social History   Socioeconomic History  . Marital status: Married    Spouse name: Not on file  . Number of children: Not on file  . Years of education: Not on file  . Highest education level: Not on file  Occupational History  . Occupation: retired  Scientific laboratory technician  . Financial resource strain: Not hard at all  . Food insecurity:    Worry: Never true    Inability: Never  true  . Transportation needs:    Medical: No    Non-medical: No  Tobacco Use  . Smoking status: Former Smoker    Packs/day: 0.25    Years: 10.00    Pack years: 2.50  . Smokeless tobacco: Never Used  . Tobacco comment: quit smoking 26yrs ago  Substance and Sexual Activity  . Alcohol use: No  . Drug use: No  . Sexual activity: Yes    Birth control/protection: Post-menopausal  Lifestyle  . Physical activity:    Days per week: 0 days    Minutes per session: 0 min  . Stress: Rather much  Relationships  . Social connections:    Talks on phone: More than three times a week    Gets together: More than three times a week    Attends religious service: More than 4 times per year    Active member of club or organization: Not on file    Attends meetings of clubs or organizations: More than 4 times per year    Relationship status: Married  Other Topics Concern  . Not on file  Social History Narrative   Lives with spouse and g-son, temporarily mother in law (05/2014)    Outpatient Encounter Medications as of 05/26/2018  Medication Sig  . calcium carbonate (CALCIUM 600) 600 MG TABS tablet Take 1 tablet (600 mg total) by mouth 2 (two) times daily with a meal.  . Cholecalciferol (VITAMIN D3) 2000 UNITS TABS Take 2,000 Units by mouth every morning.   . fluticasone (FLONASE) 50 MCG/ACT nasal spray Place 1 spray into both nostrils daily as needed for allergies or rhinitis.   Marland Kitchen losartan-hydrochlorothiazide (HYZAAR) 100-25 MG tablet Take 1 tablet by mouth daily.  . Multiple Vitamin (MULTIVITAMIN) tablet Take 1 tablet by mouth every morning.   . sennosides-docusate sodium (SENOKOT-S) 8.6-50 MG tablet Take 1 tablet by mouth at bedtime.  . [DISCONTINUED] azithromycin (ZITHROMAX) 250 MG tablet Take 1 tablet (250 mg total) by mouth daily. Take first 2 tablets together, then 1 every day until finished.  . [DISCONTINUED] HYDROcodone-homatropine (HYCODAN) 5-1.5 MG/5ML syrup Take 5 mLs by mouth every 6  (six) hours as needed for cough (or you can just take it at night before bed).   No facility-administered encounter medications on file as of 05/26/2018.     Activities of Daily Living In your present state of health, do you have any difficulty performing the following activities: 05/26/2018  Hearing? N  Vision? N  Difficulty concentrating or making decisions? N  Walking or climbing stairs? N  Dressing or bathing? N  Doing errands, shopping? N  Preparing Food and eating ? N  Using the Toilet? N  In the past six months, have you accidently leaked  urine? N  Do you have problems with loss of bowel control? N  Managing your Medications? N  Managing your Finances? N  Housekeeping or managing your Housekeeping? N  Some recent data might be hidden    Patient Care Team: Binnie Rail, MD as PCP - General (Internal Medicine) Festus Aloe, MD (Urology) Marchia Bond, MD (Orthopedic Surgery) Armandina Gemma, MD (General Surgery) Katy Apo, MD (Ophthalmology) Princess Bruins, MD (Obstetrics and Gynecology) Inda Castle, MD (Inactive) (Gastroenterology)    Assessment:   This is a routine wellness examination for Geanine. Physical assessment deferred to PCP.   Exercise Activities and Dietary recommendations Current Exercise Habits: The patient does not participate in regular exercise at present, Exercise limited by: None identified  Diet (meal preparation, eat out, water intake, caffeinated beverages, dairy products, fruits and vegetables): in general, a "healthy" diet  , well balanced   Reviewed heart healthy and diabetic diet. Encouraged patient to increase daily water and healthy fluid intake.  Goals    . Patient Stated     Lose weight by continuing to monitor my diet for sugar and carbohydrates and increase my physical activity.     . Patient Stated     Want to increase my physical activity by starting to walk with my husband and start to do chair exercises.          Fall Risk Fall Risk  05/26/2018 05/21/2017 05/20/2016 05/15/2015 02/11/2013  Falls in the past year? 0 No No Yes No  Number falls in past yr: - - - 1 -  Injury with Fall? - - - No -  Follow up - - - Falls prevention discussed -    Depression Screen PHQ 2/9 Scores 05/26/2018 05/21/2017 05/20/2016 05/15/2015  PHQ - 2 Score 0 1 0 1  PHQ- 9 Score - 2 - -     Cognitive Function       Ad8 score reviewed for issues:  Issues making decisions: no  Less interest in hobbies / activities: no  Repeats questions, stories (family complaining): no  Trouble using ordinary gadgets (microwave, computer, phone):no  Forgets the month or year: no  Mismanaging finances: no  Remembering appts: no  Daily problems with thinking and/or memory: no Ad8 score is= 0  Immunization History  Administered Date(s) Administered  . Influenza, High Dose Seasonal PF 05/15/2015, 05/20/2016, 02/27/2017  . Influenza, Seasonal, Injecte, Preservative Fre 08/12/2012  . Influenza,inj,Quad PF,6+ Mos 02/11/2013, 05/12/2014  . Pneumococcal Conjugate-13 05/15/2015  . Pneumococcal Polysaccharide-23 06/10/2010, 11/19/2016  . Zoster 10/09/2010   Screening Tests Health Maintenance  Topic Date Due  . MAMMOGRAM  09/26/2017  . INFLUENZA VACCINE  01/08/2018  . FOOT EXAM  05/21/2018  . HEMOGLOBIN A1C  05/21/2018  . OPHTHALMOLOGY EXAM  06/04/2018  . DEXA SCAN  06/24/2018  . COLONOSCOPY  10/21/2018  . TETANUS/TDAP  10/21/2020  . Hepatitis C Screening  Completed  . PNA vac Low Risk Adult  Completed      Plan:    Continue doing brain stimulating activities (puzzles, reading, adult coloring books, staying active) to keep memory sharp.   Continue to eat heart healthy diet (full of fruits, vegetables, whole grains, lean protein, water--limit salt, fat, and sugar intake) and increase physical activity as tolerated.   I have personally reviewed and noted the following in the patient's chart:   . Medical and  social history . Use of alcohol, tobacco or illicit drugs  . Current medications and supplements . Functional ability and  status . Nutritional status . Physical activity . Advanced directives . List of other physicians . Vitals . Screenings to include cognitive, depression, and falls . Referrals and appointments  In addition, I have reviewed and discussed with patient certain preventive protocols, quality metrics, and best practice recommendations. A written personalized care plan for preventive services as well as general preventive health recommendations were provided to patient.     Michiel Cowboy, RN  05/26/2018    Medical screening examination/treatment/procedure(s) were performed by non-physician practitioner and as supervising physician I was immediately available for consultation/collaboration. I agree with above. Binnie Rail, MD

## 2018-05-25 NOTE — Patient Instructions (Addendum)
  Tests ordered today. Your results will be released to MyChart (or called to you) after review, usually within 72hours after test completion. If any changes need to be made, you will be notified at that same time.  Medications reviewed and updated.  Changes include :   none      Please followup in 6 months   

## 2018-05-26 ENCOUNTER — Encounter: Payer: Self-pay | Admitting: Internal Medicine

## 2018-05-26 ENCOUNTER — Other Ambulatory Visit (INDEPENDENT_AMBULATORY_CARE_PROVIDER_SITE_OTHER): Payer: Medicare Other

## 2018-05-26 ENCOUNTER — Ambulatory Visit (INDEPENDENT_AMBULATORY_CARE_PROVIDER_SITE_OTHER): Payer: Medicare Other | Admitting: Internal Medicine

## 2018-05-26 ENCOUNTER — Ambulatory Visit (INDEPENDENT_AMBULATORY_CARE_PROVIDER_SITE_OTHER): Payer: Medicare Other | Admitting: *Deleted

## 2018-05-26 VITALS — BP 126/82 | HR 61 | Temp 97.8°F | Resp 16 | Ht 64.0 in | Wt 271.0 lb

## 2018-05-26 VITALS — BP 126/82 | HR 61 | Temp 97.8°F | Resp 18 | Ht 64.0 in | Wt 271.0 lb

## 2018-05-26 DIAGNOSIS — M545 Low back pain, unspecified: Secondary | ICD-10-CM

## 2018-05-26 DIAGNOSIS — E119 Type 2 diabetes mellitus without complications: Secondary | ICD-10-CM

## 2018-05-26 DIAGNOSIS — I10 Essential (primary) hypertension: Secondary | ICD-10-CM

## 2018-05-26 DIAGNOSIS — E7849 Other hyperlipidemia: Secondary | ICD-10-CM

## 2018-05-26 DIAGNOSIS — Z23 Encounter for immunization: Secondary | ICD-10-CM

## 2018-05-26 DIAGNOSIS — Z0000 Encounter for general adult medical examination without abnormal findings: Secondary | ICD-10-CM | POA: Diagnosis not present

## 2018-05-26 DIAGNOSIS — G8929 Other chronic pain: Secondary | ICD-10-CM

## 2018-05-26 LAB — COMPREHENSIVE METABOLIC PANEL
ALT: 19 U/L (ref 0–35)
AST: 19 U/L (ref 0–37)
Albumin: 3.9 g/dL (ref 3.5–5.2)
Alkaline Phosphatase: 65 U/L (ref 39–117)
BUN: 15 mg/dL (ref 6–23)
CO2: 31 mEq/L (ref 19–32)
Calcium: 10.3 mg/dL (ref 8.4–10.5)
Chloride: 100 mEq/L (ref 96–112)
Creatinine, Ser: 0.73 mg/dL (ref 0.40–1.20)
GFR: 101.81 mL/min (ref >60.00)
Glucose, Bld: 108 mg/dL — ABNORMAL HIGH (ref 70–99)
Potassium: 3.8 mEq/L (ref 3.5–5.1)
Sodium: 138 mEq/L (ref 135–145)
Total Bilirubin: 0.4 mg/dL (ref 0.2–1.2)
Total Protein: 8.1 g/dL (ref 6.0–8.3)

## 2018-05-26 LAB — HEMOGLOBIN A1C: Hgb A1c MFr Bld: 6.4 % (ref 4.6–6.5)

## 2018-05-26 LAB — LIPID PANEL
CHOL/HDL RATIO: 3
Cholesterol: 198 mg/dL (ref 0–200)
HDL: 59.5 mg/dL (ref >39.00)
LDL CALC: 118 mg/dL — AB (ref 0–99)
NonHDL: 138.47
Triglycerides: 102 mg/dL (ref 0.0–149.0)
VLDL: 20.4 mg/dL (ref 0.0–40.0)

## 2018-05-26 NOTE — Assessment & Plan Note (Signed)
Not currently exercising-stressed the importance of regular exercise Discussed decreasing her portions and eating a healthy diet Advised weight loss to help with her back pain, blood pressure, diabetes and cholesterol Follow-up in 6 months

## 2018-05-26 NOTE — Assessment & Plan Note (Signed)
Has intermittent lower back pain Typically relieved with sitting straight up or taking Aleve Deferred referral at this time Encouraged more regular walking and weight loss

## 2018-05-26 NOTE — Assessment & Plan Note (Signed)
Would like to avoid medication - had muscle aches with pravastatin Check lipid panel  Regular exercise and healthy diet encouraged Work on weight loss

## 2018-05-26 NOTE — Assessment & Plan Note (Signed)
BP well controlled Current regimen effective and well tolerated Continue current medications at current doses cmp  

## 2018-05-26 NOTE — Patient Instructions (Addendum)
Continue doing brain stimulating activities (puzzles, reading, adult coloring books, staying active) to keep memory sharp.   [facility] at [facility] in [place], [place] in: [facility] Address: [address] [place], [place] [address] Phone: [phone]   [name] , Thank you for taking time to come for your Medicare Wellness Visit. I appreciate your ongoing commitment to your health goals. Please review the following plan we discussed and let me know if I can assist you in the future.   These are the goals we discussed: Goals    . Patient Stated     Lose weight by continuing to monitor my diet for sugar and carbohydrates and increase my physical activity.     . Patient Stated     Want to increase my physical activity by starting to walk with my husband and start to do chair exercises.        This is a list of the screening recommended for you and due dates:  Health Maintenance  Topic Date Due  . Mammogram  09/26/2017  . Flu Shot  01/08/2018  . Complete foot exam   05/21/2018  . Hemoglobin A1C  05/21/2018  . Eye exam for diabetics  06/04/2018  . DEXA scan (bone density measurement)  06/24/2018  . Colon Cancer Screening  10/21/2018  . Tetanus Vaccine  10/21/2020  .  Hepatitis C: One time screening is recommended by Center for Disease Control  (CDC) for  adults born from [birth year] through 1966.   Completed  . Pneumonia vaccines  Completed       Influenza Virus Vaccine injection What is this medicine? INFLUENZA VIRUS VACCINE (in floo EN zuh VAHY ruhs vak SEEN) helps to reduce the risk of getting influenza also known as the flu. The vaccine only helps protect you against some strains of the flu. This medicine may be used for other purposes; ask your health care provider or pharmacist if you have questions. COMMON BRAND NAME(S): Afluria, Agriflu, Alfuria, FLUAD, Fluarix, Fluarix Quadrivalent, Flublok, Flublok  Quadrivalent, FLUCELVAX, Flulaval, Fluvirin, Fluzone, Fluzone High-Dose, Fluzone Intradermal What should I tell my health care provider before I take this medicine? They need to know if you have any of these conditions: -bleeding disorder like hemophilia -fever or infection -Guillain-Barre syndrome or other neurological problems -immune system problems -infection with the human immunodeficiency virus (HIV) or AIDS -low blood platelet counts -multiple sclerosis -an unusual or allergic reaction to influenza virus vaccine, latex, other medicines, foods, dyes, or preservatives. Different brands of vaccines contain different allergens. Some may contain latex or eggs. Talk to your doctor about your allergies to make sure that you get the right vaccine. -pregnant or trying to get pregnant -breast-feeding How should I use this medicine? This vaccine is for injection into a muscle or under the skin. It is given by a health care professional. A copy of Vaccine Information Statements will be given before each vaccination. Read this sheet carefully each time. The sheet may change frequently. Talk to your healthcare provider to see which vaccines are right for you. Some vaccines should not be used in all age groups. Overdosage: If you think you have taken too much of this medicine contact a poison control center or emergency room at once. NOTE: This medicine is only for you. Do not share this medicine with others. What if I miss a dose? This does not apply. What may interact with this medicine? -chemotherapy or  radiation therapy -medicines that lower your immune system like etanercept, anakinra, infliximab, and adalimumab -medicines that treat or prevent blood clots like warfarin -phenytoin -steroid medicines like prednisone or cortisone -theophylline -vaccines This list may not describe all possible interactions. Give your health care provider a list of all the medicines, herbs, non-prescription  drugs, or dietary supplements you use. Also tell them if you smoke, drink alcohol, or use illegal drugs. Some items may interact with your medicine. What should I watch for while using this medicine? Report any side effects that do not go away within 3 days to your doctor or health care professional. Call your health care provider if any unusual symptoms occur within 6 weeks of receiving this vaccine. You may still catch the flu, but the illness is not usually as bad. You cannot get the flu from the vaccine. The vaccine will not protect against colds or other illnesses that may cause fever. The vaccine is needed every year. What side effects may I notice from receiving this medicine? Side effects that you should report to your doctor or health care professional as soon as possible: -allergic reactions like skin rash, itching or hives, swelling of the face, lips, or tongue Side effects that usually do not require medical attention (report to your doctor or health care professional if they continue or are bothersome): -fever -headache -muscle aches and pains -pain, tenderness, redness, or swelling at the injection site -tiredness This list may not describe all possible side effects. Call your doctor for medical advice about side effects. You may report side effects to FDA at 1-800-FDA-1088. Where should I keep my medicine? The vaccine will be given by a health care professional in a clinic, pharmacy, doctor's office, or other health care setting. You will not be given vaccine doses to store at home. NOTE: This sheet is a summary. It may not cover all possible information. If you have questions about this medicine, talk to your doctor, pharmacist, or health care provider.  2018 Elsevier/Gold Standard (2014-12-16 10:07:28)  High-Fiber Diet Fiber, also called dietary fiber, is a type of carbohydrate found in fruits, vegetables, whole grains, and beans. A high-fiber diet can have many health benefits.  Your health care provider may recommend a high-fiber diet to help:  Prevent constipation. Fiber can make your bowel movements more regular.  Lower your cholesterol.  Relieve hemorrhoids, uncomplicated diverticulosis, or irritable bowel syndrome.  Prevent overeating as part of a weight-loss plan.  Prevent heart disease, type 2 diabetes, and certain cancers.  What is my plan? The recommended daily intake of fiber includes:  38 grams for men under age 80.  66 grams for men over age 23.  21 grams for women under age 73.  14 grams for women over age 66.  You can get the recommended daily intake of dietary fiber by eating a variety of fruits, vegetables, grains, and beans. Your health care provider may also recommend a fiber supplement if it is not possible to get enough fiber through your diet. What do I need to know about a high-fiber diet?  Fiber supplements have not been widely studied for their effectiveness, so it is better to get fiber through food sources.  Always check the fiber content on thenutrition facts label of any prepackaged food. Look for foods that contain at least 5 grams of fiber per serving.  Ask your dietitian if you have questions about specific foods that are related to your condition, especially if those foods are not listed  in the following section.  Increase your daily fiber consumption gradually. Increasing your intake of dietary fiber too quickly may cause bloating, cramping, or gas.  Drink plenty of water. Water helps you to digest fiber. What foods can I eat? Grains Whole-grain breads. Multigrain cereal. Oats and oatmeal. Brown rice. Barley. Bulgur wheat. [place]. Bran muffins. Popcorn. Rye wafer crackers. Vegetables Sweet potatoes. Spinach. Kale. Artichokes. Cabbage. Broccoli. Green peas. Carrots. Squash. Fruits Berries. Pears. Apples. Oranges. Avocados. Prunes and raisins. Dried figs. Meats and Other Protein Sources [place], kidney, pinto, and soy  beans. Split peas. Lentils. Nuts and seeds. Dairy Fiber-fortified yogurt. Beverages Fiber-fortified soy milk. Fiber-fortified orange juice. Other Fiber bars. The items listed above may not be a complete list of recommended foods or beverages. Contact your dietitian for more options. What foods are not recommended? Grains White bread. Pasta made with refined flour. White rice. Vegetables Fried potatoes. Canned vegetables. Well-cooked vegetables. Fruits Fruit juice. Cooked, strained fruit. Meats and Other Protein Sources Fatty cuts of meat. Fried Sales executive or fried fish. Dairy Milk. Yogurt. Cream cheese. Sour cream. Beverages Soft drinks. Other Cakes and pastries. Butter and oils. The items listed above may not be a complete list of foods and beverages to avoid. Contact your dietitian for more information. What are some tips for including high-fiber foods in my diet?  Eat a wide variety of high-fiber foods.  Make sure that half of all grains consumed each day are whole grains.  Replace breads and cereals made from refined flour or white flour with whole-grain breads and cereals.  Replace white rice with brown rice, bulgur wheat, or millet.  Start the day with a breakfast that is high in fiber, such as a cereal that contains at least 5 grams of fiber per serving.  Use beans in place of meat in soups, salads, or pasta.  Eat high-fiber snacks, such as berries, raw vegetables, nuts, or popcorn. This information is not intended to replace advice given to you by your health care provider. Make sure you discuss any questions you have with your health care provider. Document Released: 05/27/2005 Document Revised: 11/02/2015 Document Reviewed: 11/09/2013 Elsevier Interactive Patient Education  2018 [employer]. Diverticulosis Diverticulosis is a condition that develops when small pouches (diverticula) form in the wall of the large intestine (colon). The colon is where water is  absorbed and stool is formed. The pouches form when the inside layer of the colon pushes through weak spots in the outer layers of the colon. You may have a few pouches or many of them. What are the causes? The cause of this condition is not known. What increases the risk? The following factors may make you more likely to develop this condition:  Being older than age 36. Your risk for this condition increases with age. Diverticulosis is rare among people younger than age 45. By age 77, many people have it.  Eating a low-fiber diet.  Having frequent constipation.  Being overweight.  Not getting enough exercise.  Smoking.  Taking over-the-counter pain medicines, like aspirin and ibuprofen.  Having a family history of diverticulosis.  What are the signs or symptoms? In most people, there are no symptoms of this condition. If you do have symptoms, they may include:  Bloating.  Cramps in the abdomen.  Constipation or diarrhea.  Pain in the lower left side of the abdomen.  How is this diagnosed? This condition is most often diagnosed during an exam for other colon problems. Because diverticulosis usually has no symptoms,  it often cannot be diagnosed independently. This condition may be diagnosed by:  Using a flexible scope to examine the colon (colonoscopy).  Taking an X-ray of the colon after dye has been put into the colon (barium enema).  Doing a CT scan.  How is this treated? You may not need treatment for this condition if you have never developed an infection related to diverticulosis. If you have had an infection before, treatment may include:  Eating a high-fiber diet. This may include eating more fruits, vegetables, and grains.  Taking a fiber supplement.  Taking a live bacteria supplement (probiotic).  Taking medicine to relax your colon.  Taking antibiotic medicines.  Follow these instructions at home:  Drink 6-8 glasses of water or more each day to  prevent constipation.  Try not to strain when you have a bowel movement.  If you have had an infection before: ? Eat more fiber as directed by your health care provider or your diet and nutrition specialist (dietitian). ? Take a fiber supplement or probiotic, if your health care provider approves.  Take over-the-counter and prescription medicines only as told by your health care provider.  If you were prescribed an antibiotic, take it as told by your health care provider. Do not stop taking the antibiotic even if you start to feel better.  Keep all follow-up visits as told by your health care provider. This is important. Contact a health care provider if:  You have pain in your abdomen.  You have bloating.  You have cramps.  You have not had a bowel movement in 3 days. Get help right away if:  Your pain gets worse.  Your bloating becomes very bad.  You have a fever or chills, and your symptoms suddenly get worse.  You vomit.  You have bowel movements that are bloody or black.  You have bleeding from your rectum. Summary  Diverticulosis is a condition that develops when small pouches (diverticula) form in the wall of the large intestine (colon).  You may have a few pouches or many of them.  This condition is most often diagnosed during an exam for other colon problems.  If you have had an infection related to diverticulosis, treatment may include increasing the fiber in your diet, taking supplements, or taking medicines. This information is not intended to replace advice given to you by your health care provider. Make sure you discuss any questions you have with your health care provider. Document Released: 02/22/2004 Document Revised: 04/15/2016 Document Reviewed: 04/15/2016 Elsevier Interactive Patient Education  2017 [employer].

## 2018-08-31 DIAGNOSIS — L659 Nonscarring hair loss, unspecified: Secondary | ICD-10-CM | POA: Diagnosis not present

## 2018-10-21 ENCOUNTER — Encounter: Payer: Self-pay | Admitting: Gastroenterology

## 2018-10-27 ENCOUNTER — Encounter: Payer: Self-pay | Admitting: Gastroenterology

## 2018-11-10 ENCOUNTER — Ambulatory Visit (AMBULATORY_SURGERY_CENTER): Payer: Self-pay | Admitting: *Deleted

## 2018-11-10 ENCOUNTER — Other Ambulatory Visit: Payer: Self-pay

## 2018-11-10 VITALS — Ht 64.0 in | Wt 260.0 lb

## 2018-11-10 DIAGNOSIS — Z860100 Personal history of colon polyps, unspecified: Secondary | ICD-10-CM

## 2018-11-10 DIAGNOSIS — Z800 Family history of malignant neoplasm of digestive organs: Secondary | ICD-10-CM

## 2018-11-10 MED ORDER — NA SULFATE-K SULFATE-MG SULF 17.5-3.13-1.6 GM/177ML PO SOLN: 1.0000 | Freq: Once | ORAL | 0 refills | Status: AC

## 2018-11-10 NOTE — Progress Notes (Signed)

## 2018-11-19 DIAGNOSIS — Z1231 Encounter for screening mammogram for malignant neoplasm of breast: Secondary | ICD-10-CM | POA: Diagnosis not present

## 2018-11-19 LAB — HM MAMMOGRAPHY

## 2018-11-23 ENCOUNTER — Telehealth: Payer: Self-pay | Admitting: *Deleted

## 2018-11-23 NOTE — Telephone Encounter (Signed)
Covid-19 screening questions   Do you now or have you had a fever in the last 14 days?no  Do you have any respiratory symptoms of shortness of breath or cough now or in the last 14 days?no  Do you have any family members or close contacts with diagnosed or suspected Covid-19 in the past 14 days?no  Have you been tested for Covid-19 and found to be positive?no  Pt made aware of bringing a mask with her and her care partner will need to bring one as well if they are staying in the lobby during the procedure. Sm

## 2018-11-24 ENCOUNTER — Ambulatory Visit (AMBULATORY_SURGERY_CENTER): Payer: Medicare Other | Admitting: Gastroenterology

## 2018-11-24 ENCOUNTER — Other Ambulatory Visit: Payer: Self-pay

## 2018-11-24 ENCOUNTER — Encounter: Payer: Self-pay | Admitting: Gastroenterology

## 2018-11-24 VITALS — BP 136/74 | HR 56 | Temp 98.4°F | Resp 14 | Ht 64.0 in | Wt 260.0 lb

## 2018-11-24 DIAGNOSIS — Z860100 Personal history of colon polyps, unspecified: Secondary | ICD-10-CM

## 2018-11-24 DIAGNOSIS — D122 Benign neoplasm of ascending colon: Secondary | ICD-10-CM

## 2018-11-24 DIAGNOSIS — D127 Benign neoplasm of rectosigmoid junction: Secondary | ICD-10-CM | POA: Diagnosis not present

## 2018-11-24 MED ORDER — SODIUM CHLORIDE 0.9 % IV SOLN: 500.0000 mL | Freq: Once | INTRAVENOUS | Status: DC

## 2018-11-24 NOTE — Patient Instructions (Signed)
HANDOUTS GIVEN FOR POLYPS, HEMORRHOIDS AND DIVERTICULOSIS.  YOU HAD AN ENDOSCOPIC PROCEDURE TODAY AT [facility]:   Refer to the procedure report that was given to you for any specific questions about what was found during the examination.  If the procedure report does not answer your questions, please call your gastroenterologist to clarify.  If you requested that your care partner not be given the details of your procedure findings, then the procedure report has been included in a sealed envelope for you to review at your convenience later.  YOU SHOULD EXPECT: Some feelings of bloating in the abdomen. Passage of more gas than usual.  Walking can help get rid of the air that was put into your GI tract during the procedure and reduce the bloating. If you had a lower endoscopy (such as a colonoscopy or flexible sigmoidoscopy) you may notice spotting of blood in your stool or on the toilet paper. If you underwent a bowel prep for your procedure, you may not have a normal bowel movement for a few days.  Please Note:  You might notice some irritation and congestion in your nose or some drainage.  This is from the oxygen used during your procedure.  There is no need for concern and it should clear up in a day or so.  SYMPTOMS TO REPORT IMMEDIATELY:   Following lower endoscopy (colonoscopy or flexible sigmoidoscopy):  Excessive amounts of blood in the stool  Significant tenderness or worsening of abdominal pains  Swelling of the abdomen that is new, acute  Fever of 100F or higher   For urgent or emergent issues, a gastroenterologist can be reached at any hour by calling [phone].   DIET:  We do recommend a small meal at first, but then you may proceed to your regular diet.  Drink plenty of fluids but you should avoid alcoholic beverages for 24 hours.  ACTIVITY:  You should plan to take it easy for the rest of today and you should NOT DRIVE or use heavy machinery until  tomorrow (because of the sedation medicines used during the test).    FOLLOW UP: Our staff will call the number listed on your records 48-72 hours following your procedure to check on you and address any questions or concerns that you may have regarding the information given to you following your procedure. If we do not reach you, we will leave a message.  We will attempt to reach you two times.  During this call, we will ask if you have developed any symptoms of COVID 19. If you develop any symptoms (ie: fever, flu-like symptoms, shortness of breath, cough etc.) before then, please call [phone].  If you test positive for Covid 19 in the 2 weeks post procedure, please call and report this information to [place].    If any biopsies were taken you will be contacted by phone or by letter within the next 1-3 weeks.  Please call us at [phone] if you have not heard about the biopsies in 3 weeks.    SIGNATURES/CONFIDENTIALITY: You and/or your care partner have signed paperwork which will be entered into your electronic medical record.  These signatures attest to the fact that that the information above on your After Visit Summary has been reviewed and is understood.  Full responsibility of the confidentiality of this discharge information lies with you and/or your care-partner.

## 2018-11-24 NOTE — Op Note (Signed)
[place] Patient Name: Frances Gray Procedure Date: 11/24/2018 10:09 AM MRN: [id number] Endoscopist: Mauri Pole , MD Age: 68 Referring MD:  Date of Birth: 1950/08/18 Gender: Female Account #: [id number] Procedure:                Colonoscopy Indications:              High risk colon cancer surveillance: Personal                            history of colonic polyps Medicines:                Monitored Anesthesia Care Procedure:                Pre-Anesthesia Assessment:                           - Prior to the procedure, a History and Physical                            was performed, and patient medications and                            allergies were reviewed. The patient's tolerance of                            previous anesthesia was also reviewed. The risks                            and benefits of the procedure and the sedation                            options and risks were discussed with the patient.                            All questions were answered, and informed consent                            was obtained. Prior Anticoagulants: The patient has                            taken no previous anticoagulant or antiplatelet                            agents. ASA Grade Assessment: III - A patient with                            severe systemic disease. After reviewing the risks                            and benefits, the patient was deemed in                            satisfactory condition to undergo the procedure.  After obtaining informed consent, the colonoscope                            was passed under direct vision. Throughout the                            procedure, the patient's blood pressure, pulse, and                            oxygen saturations were monitored continuously. The                            Model [id number]-[id number]) scope was introduced                            through the anus and  advanced to the the cecum,                            identified by appendiceal orifice and ileocecal                            valve. The colonoscopy was performed without                            difficulty. The patient tolerated the procedure                            well. The quality of the bowel preparation was                            good. The ileocecal valve, appendiceal orifice, and                            rectum were photographed. Scope In: 10:27:24 AM Scope Out: 10:43:53 AM Scope Withdrawal Time: 0 hours 11 minutes 22 seconds  Total Procedure Duration: 0 hours 16 minutes 29 seconds  Findings:                 The perianal and digital rectal examinations were                            normal.                           A 1 mm polyp was found in the ascending colon. The                            polyp was sessile. The polyp was removed with a                            cold biopsy forceps. Resection and retrieval were                            complete.  A 3 mm polyp was found in the recto-sigmoid colon.                            The polyp was sessile. The polyp was removed with a                            cold snare. Resection and retrieval were complete.                           Multiple small and large-mouthed diverticula were                            found in the sigmoid colon, descending colon,                            transverse colon and ascending colon.                           Non-bleeding external and internal hemorrhoids were                            found during retroflexion. The hemorrhoids were                            medium-sized. Complications:            No immediate complications. Estimated Blood Loss:     Estimated blood loss was minimal. Impression:               - One 1 mm polyp in the ascending colon, removed                            with a cold biopsy forceps. Resected and retrieved.                            - One 3 mm polyp at the recto-sigmoid colon,                            removed with a cold snare. Resected and retrieved.                           - Moderate diverticulosis in the sigmoid colon, in                            the descending colon, in the transverse colon and                            in the ascending colon.                           - Non-bleeding external and internal hemorrhoids. Recommendation:           - Patient has a contact number available for  emergencies. The signs and symptoms of potential                            delayed complications were discussed with the                            patient. Return to normal activities tomorrow.                            Written discharge instructions were provided to the                            patient.                           - Resume previous diet.                           - Continue present medications.                           - Await pathology results.                           - Repeat colonoscopy in 7-10 years for surveillance                            based on pathology results. Mauri Pole, MD 11/24/2018 10:49:35 AM This report has been signed electronically.

## 2018-11-24 NOTE — Progress Notes (Signed)
Pt's states no medical or surgical changes since previsit or office visit. 

## 2018-11-24 NOTE — Progress Notes (Signed)
Riki Sheer took temp and Rica Mote took vitals.

## 2018-11-24 NOTE — Progress Notes (Signed)
PT taken to PACU. Monitors in place. VSS. Report given to RN. 

## 2018-11-24 NOTE — Progress Notes (Signed)
Subjective:    Patient ID: Frances Gray, female    DOB: 15-Nov-1950, 68 y.o.   MRN: [id number]  HPI The patient is here for follow up.  She is not exercising regularly.     Hypertension: She is taking her medication daily. She is compliant with a low sodium diet.  She denies chest pain, palpitations, edema, shortness of breath and regular headaches.   Diabetes: She is controlling her sugars with diet. She has been less compliant with a diabetic diet.  She denies numbness/tingling in her feet and foot lesions.    Hyperlipidemia: She is not on any medication daily. She did not tolerate pravastatin.  She is compliant with a low fat/cholesterol diet.     Medications and allergies reviewed with patient and updated if appropriate.  Patient Active Problem List   Diagnosis Date Noted  . Plantar fasciitis, right 12/05/2016  . Diabetes mellitus without complication ([place]) 30/02/2329  . Nodule of flexor tendon sheath 01/23/2016  . Midline low back pain without sciatica 01/23/2016  . Knee osteoarthritis 12/06/2014  . Hyperlipidemia   . Osteopenia 12/29/2013  . Severe obesity (BMI >= 40) ([place]) 11/16/2012  . 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 Anemia, unspecified 08/14/2011  . Vitamin D deficiency disease 08/14/2011  . Kidney stone 07/03/2011  . GERD (gastroesophageal reflux disease)   . Hypertension     Current Outpatient Medications on File Prior to Visit  Medication Sig Dispense Refill  . calcium carbonate (CALCIUM 600) 600 MG TABS tablet Take 1 tablet (600 mg total) by mouth 2 (two) times daily with a meal. 60 tablet 6  . Cholecalciferol (VITAMIN D3) 2000 UNITS TABS Take 2,000 Units by mouth every morning.     . Fluocinolone Acetonide Scalp 0.01 % OIL 1 ML AS DIRECTED APPLY TO SCALP EVERY NIGHT AS NEEDED FOR AN ITCHY SCALP.    . fluticasone (FLONASE) 50 MCG/ACT nasal spray Place 1 spray into both nostrils daily as needed for allergies or rhinitis.   0  . Multiple Vitamin  (MULTIVITAMIN) tablet Take 1 tablet by mouth every morning.     . sennosides-docusate sodium (SENOKOT-S) 8.6-50 MG tablet Take 1 tablet by mouth at bedtime.     No current facility-administered medications on file prior to visit.     Past Medical History:  Diagnosis Date  . Allergic rhinitis, seasonal    takes Claritin daily as needed and uses Flonase if needed  . Allergy   . 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 Diverticulitis   . GERD (gastroesophageal reflux disease)    doesn't take any meds  . Heart murmur    no symptoms  . History of bronchitis winter of 2014  . History of colon polyps    benign  . History of kidney stones   . Hyperlipidemia   . Hypertension    takes Hyzaar daily  . Vitamin D deficiency disease 08/14/2011   takes Vit D daily    Past Surgical History:  Procedure Laterality Date  . BREAST BIOPSY Left   . Breast ultrasound Right 03/02/13   The 60mm complex cyst in the right breast is probably benign. Follow-up in 6 months with bilateral mammogram and (R) ultrasound  . Breast Ultrasound Right 09/02/13   There is a benign 3 mm complex cyst in the (R) breast at 9 o'clock  . COLONOSCOPY    . ESOPHAGUS SURGERY  1996   tumor removed   (  benign)  . KNEE ARTHROSCOPY     bilateral  . LITHOTRIPSY  08/2011   R ureteral stone  . POLYPECTOMY    . TOTAL KNEE ARTHROPLASTY Left 11/16/2012   Procedure: TOTAL KNEE ARTHROPLASTY- left;  Surgeon: Johnny Bridge, MD;  Location: [facility];  Service: Orthopedics;  Laterality: Left;  . TOTAL KNEE ARTHROPLASTY Right 12/06/2014   Procedure: TOTAL KNEE ARTHROPLASTY;  Surgeon: Marchia Bond, MD;  Location: [facility];  Service: Orthopedics;  Laterality: Right;    Social History   Socioeconomic History  . Marital status: Married    Spouse name: Not on file  . Number of children: Not on file  . Years of education: Not on file  . Highest education level: Not on file  Occupational History  . Occupation: retired  Photographer  . Financial resource strain: Not hard at all  . Food insecurity    Worry: Never true    Inability: Never true  . Transportation needs    Medical: No    Non-medical: No  Tobacco Use  . Smoking status: Former Smoker    Packs/day: 0.25    Years: 10.00    Pack years: 2.50  . Smokeless tobacco: Never Used  . Tobacco comment: quit smoking 76yrs ago  Substance and Sexual Activity  . Alcohol use: No  . Drug use: No  . Sexual activity: Yes    Birth control/protection: Post-menopausal  Lifestyle  . Physical activity    Days per week: 0 days    Minutes per session: 0 min  . Stress: Rather much  Relationships  . Social connections    Talks on phone: More than three times a week    Gets together: More than three times a week    Attends religious service: More than 4 times per year    Active member of club or organization: Not on file    Attends meetings of clubs or organizations: More than 4 times per year    Relationship status: Married  Other Topics Concern  . Not on file  Social History Narrative   Lives with spouse and g-son, temporarily mother in law (05/2014)    Family History  Problem Relation Age of Onset  . Ovarian cancer Mother   . Colon cancer Sister 75  . Breast cancer Sister   . Esophageal cancer Neg Hx   . Colon polyps Neg Hx   . Rectal cancer Neg Hx   . Stomach cancer Neg Hx     Review of Systems  Constitutional: Negative for chills and fever.  Respiratory: Negative for cough, shortness of breath and wheezing.   Cardiovascular: Negative for chest pain, palpitations and leg swelling.  Neurological: Negative for light-headedness, numbness and headaches.       Objective:   Vitals:   11/25/18 0914  BP: 122/82  Pulse: (!) 52  Resp: 16  Temp: 97.7 F (36.5 C)  SpO2: 99%   BP Readings from Last 3 Encounters:  11/25/18 122/82  11/24/18 136/74  05/26/18 126/82   Wt Readings from Last 3 Encounters:  11/25/18 274 lb (124.3 kg)  11/24/18 260 lb  (117.9 kg)  11/10/18 260 lb (117.9 kg)   Body mass index is 47.03 kg/m.   Physical Exam    Constitutional: Appears well-developed and well-nourished. No distress.  HENT:  Head: Normocephalic and atraumatic.  Neck: Neck supple. No tracheal deviation present. No thyromegaly present.  No cervical lymphadenopathy Cardiovascular: Normal rate, regular rhythm and normal heart sounds.  No murmur heard. No carotid bruit .  No edema Pulmonary/Chest: Effort normal and breath sounds normal. No respiratory distress. No has no wheezes. No rales.  Skin: Skin is warm and dry. Not diaphoretic.  Psychiatric: Normal mood and affect. Behavior is normal.      Assessment & Plan:    See Problem List for Assessment and Plan of chronic medical problems.

## 2018-11-24 NOTE — Progress Notes (Signed)
Called to room to assist during endoscopic procedure.  Patient ID and intended procedure confirmed with present staff. Received instructions for my participation in the procedure from the performing physician.  

## 2018-11-25 ENCOUNTER — Other Ambulatory Visit (INDEPENDENT_AMBULATORY_CARE_PROVIDER_SITE_OTHER): Payer: Medicare Other

## 2018-11-25 ENCOUNTER — Ambulatory Visit (INDEPENDENT_AMBULATORY_CARE_PROVIDER_SITE_OTHER): Payer: Medicare Other | Admitting: Internal Medicine

## 2018-11-25 ENCOUNTER — Encounter: Payer: Self-pay | Admitting: Internal Medicine

## 2018-11-25 VITALS — BP 122/82 | HR 52 | Temp 97.7°F | Resp 16 | Ht 64.0 in | Wt 274.0 lb

## 2018-11-25 DIAGNOSIS — E7849 Other hyperlipidemia: Secondary | ICD-10-CM | POA: Diagnosis not present

## 2018-11-25 DIAGNOSIS — I10 Essential (primary) hypertension: Secondary | ICD-10-CM | POA: Diagnosis not present

## 2018-11-25 DIAGNOSIS — E119 Type 2 diabetes mellitus without complications: Secondary | ICD-10-CM

## 2018-11-25 LAB — COMPREHENSIVE METABOLIC PANEL
ALT: 16 U/L (ref 0–35)
AST: 19 U/L (ref 0–37)
Albumin: 3.8 g/dL (ref 3.5–5.2)
Alkaline Phosphatase: 65 U/L (ref 39–117)
BUN: 10 mg/dL (ref 6–23)
CO2: 30 mEq/L (ref 19–32)
Calcium: 9.6 mg/dL (ref 8.4–10.5)
Chloride: 102 mEq/L (ref 96–112)
Creatinine, Ser: 0.68 mg/dL (ref 0.40–1.20)
GFR: 103.81 mL/min (ref >60.00)
Glucose, Bld: 97 mg/dL (ref 70–99)
Potassium: 3.8 mEq/L (ref 3.5–5.1)
Sodium: 138 mEq/L (ref 135–145)
Total Bilirubin: 0.4 mg/dL (ref 0.2–1.2)
Total Protein: 7.7 g/dL (ref 6.0–8.3)

## 2018-11-25 LAB — LIPID PANEL
Cholesterol: 190 mg/dL (ref 0–200)
HDL: 53.8 mg/dL (ref >39.00)
LDL Cholesterol: 111 mg/dL — ABNORMAL HIGH (ref 0–99)
NonHDL: 136.07
Total CHOL/HDL Ratio: 4
Triglycerides: 125 mg/dL (ref 0.0–149.0)
VLDL: 25 mg/dL (ref 0.0–40.0)

## 2018-11-25 LAB — HEMOGLOBIN A1C: Hgb A1c MFr Bld: 6.6 % — ABNORMAL HIGH (ref 4.6–6.5)

## 2018-11-25 MED ORDER — LOSARTAN POTASSIUM-HCTZ 100-25 MG PO TABS: 1.0000 | ORAL_TABLET | Freq: Every day | ORAL | 1 refills | Status: DC

## 2018-11-25 NOTE — Assessment & Plan Note (Signed)
Did not tolerate pravastatin Discussed that she should be on a statin  Will check lipids, cmp Consider starting crestor 5 mg twice a week to see if she tolerates it - will see what blood work looks like, but discussed since she has diabetes no matter what her cholesterol is she should still be on the medication Increase exercise

## 2018-11-25 NOTE — Assessment & Plan Note (Signed)
BP well controlled Current regimen effective and well tolerated Continue current medications at current doses cmp  

## 2018-11-25 NOTE — Assessment & Plan Note (Addendum)
Diet controlled, but has been less compliant with a diabetic diet Check a1c Low sugar / carb diet Stressed regular exercise Work on weight loss

## 2018-11-25 NOTE — Patient Instructions (Signed)

## 2018-11-26 ENCOUNTER — Telehealth: Payer: Self-pay

## 2018-11-26 ENCOUNTER — Other Ambulatory Visit: Payer: Self-pay | Admitting: Internal Medicine

## 2018-11-26 ENCOUNTER — Encounter: Payer: Self-pay | Admitting: Internal Medicine

## 2018-11-26 DIAGNOSIS — E7849 Other hyperlipidemia: Secondary | ICD-10-CM

## 2018-11-26 MED ORDER — ROSUVASTATIN CALCIUM 5 MG PO TABS: ORAL_TABLET | ORAL | 1 refills | Status: DC

## 2018-11-26 NOTE — Telephone Encounter (Signed)
  Follow up Call-  Call back number 11/24/2018  Post procedure Call Back phone  # home [phone], call [phone]  Permission to leave phone message Yes  Some recent data might be hidden     Patient questions:  Do you have a fever, pain , or abdominal swelling? No. Pain Score  0 *  Have you tolerated food without any problems? Yes.    Have you been able to return to your normal activities? Yes.    Do you have any questions about your discharge instructions: Diet   No. Medications  No. Follow up visit  No.  Do you have questions or concerns about your Care? No.  Actions: * If pain score is 4 or above: No action needed, pain <4.  1. Have you developed a fever since your procedure? no  2.   Have you had an respiratory symptoms (SOB or cough) since your procedure? no  3.   Have you tested positive for COVID 19 since your procedure no  4.   Have you had any family members/close contacts diagnosed with the COVID 19 since your procedure?  no   If yes to any of these questions please route to Joylene John, RN and Alphonsa Gin, Therapist, sports.

## 2018-12-03 DIAGNOSIS — Z124 Encounter for screening for malignant neoplasm of cervix: Secondary | ICD-10-CM | POA: Diagnosis not present

## 2018-12-07 DIAGNOSIS — Z6841 Body Mass Index (BMI) 40.0 and over, adult: Secondary | ICD-10-CM | POA: Diagnosis not present

## 2018-12-07 DIAGNOSIS — Z96653 Presence of artificial knee joint, bilateral: Secondary | ICD-10-CM | POA: Diagnosis not present

## 2018-12-10 ENCOUNTER — Encounter: Payer: Self-pay | Admitting: Gastroenterology

## 2018-12-22 ENCOUNTER — Encounter: Payer: Self-pay | Admitting: Internal Medicine

## 2018-12-22 NOTE — Progress Notes (Signed)
Abstracted and sent to scan  

## 2018-12-30 DIAGNOSIS — L668 Other cicatricial alopecia: Secondary | ICD-10-CM | POA: Diagnosis not present

## 2019-01-04 ENCOUNTER — Encounter: Payer: Self-pay | Admitting: Internal Medicine

## 2019-01-04 DIAGNOSIS — E119 Type 2 diabetes mellitus without complications: Secondary | ICD-10-CM | POA: Diagnosis not present

## 2019-01-04 DIAGNOSIS — H5203 Hypermetropia, bilateral: Secondary | ICD-10-CM | POA: Diagnosis not present

## 2019-01-04 DIAGNOSIS — H524 Presbyopia: Secondary | ICD-10-CM | POA: Diagnosis not present

## 2019-01-04 DIAGNOSIS — H52223 Regular astigmatism, bilateral: Secondary | ICD-10-CM | POA: Diagnosis not present

## 2019-01-04 LAB — HM DIABETES EYE EXAM

## 2019-01-06 DIAGNOSIS — N7011 Chronic salpingitis: Secondary | ICD-10-CM | POA: Diagnosis not present

## 2019-01-06 DIAGNOSIS — Z8041 Family history of malignant neoplasm of ovary: Secondary | ICD-10-CM | POA: Diagnosis not present

## 2019-01-06 DIAGNOSIS — R9389 Abnormal findings on diagnostic imaging of other specified body structures: Secondary | ICD-10-CM | POA: Diagnosis not present

## 2019-02-10 ENCOUNTER — Other Ambulatory Visit: Payer: Self-pay | Admitting: Internal Medicine

## 2019-03-13 DIAGNOSIS — Z1159 Encounter for screening for other viral diseases: Secondary | ICD-10-CM | POA: Diagnosis not present

## 2019-04-08 ENCOUNTER — Other Ambulatory Visit: Payer: Self-pay | Admitting: Internal Medicine

## 2019-04-14 DIAGNOSIS — H259 Unspecified age-related cataract: Secondary | ICD-10-CM | POA: Diagnosis not present

## 2019-04-14 DIAGNOSIS — E119 Type 2 diabetes mellitus without complications: Secondary | ICD-10-CM | POA: Diagnosis not present

## 2019-04-14 DIAGNOSIS — Z135 Encounter for screening for eye and ear disorders: Secondary | ICD-10-CM | POA: Diagnosis not present

## 2019-04-14 DIAGNOSIS — H5203 Hypermetropia, bilateral: Secondary | ICD-10-CM | POA: Diagnosis not present

## 2019-05-31 NOTE — Patient Instructions (Addendum)
  Tests ordered today. Your results will be released to [facility] (or called to you) after review.  If any changes need to be made, you will be notified at that same time.  An ultrasound of your thyroid was ordered.   Medications reviewed and updated.  Changes include :   none  Your prescription(s) have been submitted to your pharmacy. Please take as directed and contact our office if you believe you are having problem(s) with the medication(s).   Please followup in 6  months

## 2019-05-31 NOTE — Progress Notes (Signed)
Subjective:    Patient ID: Frances Gray, female    DOB: 1951-03-25, 68 y.o.   MRN: [id number]  HPI The patient is here for follow up.   She is not exercising regularly.    Hypertension: She is taking her medication daily. She is compliant with a low sodium diet.  She denies chest pain, palpitations, edema, shortness of breath and regular headaches. She does not monitor her blood pressure at home.    Diabetes: She is taking her medication daily as prescribed. She is compliant with a diabetic diet.   She denies numbness/tingling in her feet and foot lesions. She is up-to-date with an ophthalmology examination.   Hyperlipidemia: She is taking her medication daily. She is compliant with a low fat/cholesterol diet. She denies myalgias.   Multinodular goiter: She denies any obvious changes in the size of her thyroid.  She has had it biopsied in the past, which was negative.  Medications and allergies reviewed with patient and updated if appropriate.  Patient Active Problem List   Diagnosis Date Noted  . Plantar fasciitis, right 12/05/2016  . Diabetes mellitus without complication ([place]) 99991111  . Nodule of flexor tendon sheath 01/23/2016  . Midline low back pain without sciatica 01/23/2016  . Knee osteoarthritis 12/06/2014  . Hyperlipidemia   . Osteopenia 12/29/2013  . Severe obesity (BMI >= 40) ([place]) 11/16/2012  . 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 Anemia, unspecified 08/14/2011  . Vitamin D deficiency disease 08/14/2011  . Kidney stone 07/03/2011  . GERD (gastroesophageal reflux disease)   . Hypertension     Current Outpatient Medications on File Prior to Visit  Medication Sig Dispense Refill  . calcium carbonate (CALCIUM 600) 600 MG TABS tablet Take 1 tablet (600 mg total) by mouth 2 (two) times daily with a meal. 60 tablet 6  . Cholecalciferol (VITAMIN D3) 2000 UNITS TABS Take 2,000 Units by mouth every morning.     . Fluocinolone Acetonide Scalp 0.01 % OIL  1 ML AS DIRECTED APPLY TO SCALP EVERY NIGHT AS NEEDED FOR AN ITCHY SCALP.    . fluticasone (FLONASE) 50 MCG/ACT nasal spray Place 1 spray into both nostrils daily as needed for allergies or rhinitis.   0  . Multiple Vitamin (MULTIVITAMIN) tablet Take 1 tablet by mouth every morning.     . rosuvastatin (CRESTOR) 5 MG tablet TAKE 1 TABLET TWICE A WEEK 24 tablet 1  . sennosides-docusate sodium (SENOKOT-S) 8.6-50 MG tablet Take 1 tablet by mouth at bedtime.     No current facility-administered medications on file prior to visit.    Past Medical History:  Diagnosis Date  . Allergic rhinitis, seasonal    takes Claritin daily as needed and uses Flonase if needed  . Allergy   . 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 Diverticulitis   . GERD (gastroesophageal reflux disease)    doesn't take any meds  . Heart murmur    no symptoms  . History of bronchitis winter of 2014  . History of colon polyps    benign  . History of kidney stones   . Hyperlipidemia   . Hypertension    takes Hyzaar daily  . Vitamin D deficiency disease 08/14/2011   takes Vit D daily    Past Surgical History:  Procedure Laterality Date  . BREAST BIOPSY Left   . Breast ultrasound Right 03/02/13   The 48mm complex cyst in the right breast is probably  benign. Follow-up in 6 months with bilateral mammogram and (R) ultrasound  . Breast Ultrasound Right 09/02/13   There is a benign 3 mm complex cyst in the (R) breast at 9 o'clock  . COLONOSCOPY    . ESOPHAGUS SURGERY  1996   tumor removed   (benign)  . KNEE ARTHROSCOPY     bilateral  . LITHOTRIPSY  08/2011   R ureteral stone  . POLYPECTOMY    . TOTAL KNEE ARTHROPLASTY Left 11/16/2012   Procedure: TOTAL KNEE ARTHROPLASTY- left;  Surgeon: Johnny Bridge, MD;  Location: [facility];  Service: Orthopedics;  Laterality: Left;  . TOTAL KNEE ARTHROPLASTY Right 12/06/2014   Procedure: TOTAL KNEE ARTHROPLASTY;  Surgeon: Marchia Bond, MD;  Location: [facility];  Service:  Orthopedics;  Laterality: Right;    Social History   Socioeconomic History  . Marital status: Married    Spouse name: Not on file  . Number of children: Not on file  . Years of education: Not on file  . Highest education level: Not on file  Occupational History  . Occupation: retired  Tobacco Use  . Smoking status: Former Smoker    Packs/day: 0.25    Years: 10.00    Pack years: 2.50  . Smokeless tobacco: Never Used  . Tobacco comment: quit smoking 40yrs ago  Substance and Sexual Activity  . Alcohol use: No  . Drug use: No  . Sexual activity: Yes    Birth control/protection: Post-menopausal  Other Topics Concern  . Not on file  Social History Narrative   Lives with spouse and g-son, temporarily mother in law (05/2014)   Social Determinants of Health   Financial Resource Strain:   . Difficulty of Paying Living Expenses: Not on file  Food Insecurity:   . Worried About Charity fundraiser in the Last Year: Not on file  . Ran Out of Food in the Last Year: Not on file  Transportation Needs:   . Lack of Transportation (Medical): Not on file  . Lack of Transportation (Non-Medical): Not on file  Physical Activity:   . Days of Exercise per Week: Not on file  . Minutes of Exercise per Session: Not on file  Stress:   . Feeling of Stress : Not on file  Social Connections:   . Frequency of Communication with Friends and Family: Not on file  . Frequency of Social Gatherings with Friends and Family: Not on file  . Attends Religious Services: Not on file  . Active Member of Clubs or Organizations: Not on file  . Attends Archivist Meetings: Not on file  . Marital Status: Not on file    Family History  Problem Relation Age of Onset  . Ovarian cancer Mother   . Colon cancer Sister 59  . Breast cancer Sister   . Esophageal cancer Neg Hx   . Colon polyps Neg Hx   . Rectal cancer Neg Hx   . Stomach cancer Neg Hx     Review of Systems  Constitutional: Negative for  chills and fever.  Respiratory: Negative for cough, shortness of breath and wheezing.   Cardiovascular: Negative for chest pain, palpitations and leg swelling.  Neurological: Negative for light-headedness and headaches.       Objective:   Vitals:   06/01/19 0939  BP: 140/88  Pulse: (!) 58  Resp: 16  Temp: 97.7 F (36.5 C)  SpO2: 98%   BP Readings from Last 3 Encounters:  06/01/19 140/88  11/25/18  122/82  11/24/18 136/74   Wt Readings from Last 3 Encounters:  06/01/19 279 lb (126.6 kg)  11/25/18 274 lb (124.3 kg)  11/24/18 260 lb (117.9 kg)   Body mass index is 47.89 kg/m.   Physical Exam    Constitutional: Appears well-developed and well-nourished. No distress.  HENT:  Head: Normocephalic and atraumatic.  Neck: Neck supple. No tracheal deviation present. thyromegaly present.  No cervical lymphadenopathy Cardiovascular: Normal rate, regular rhythm and normal heart sounds.  No murmur heard. No carotid bruit .  Mild b/l LE edema Pulmonary/Chest: Effort normal and breath sounds normal. No respiratory distress. No has no wheezes. No rales.  Skin: Skin is warm and dry. Not diaphoretic.  Psychiatric: Normal mood and affect. Behavior is normal.      Assessment & Plan:    See Problem List for Assessment and Plan of chronic medical problems.     This visit occurred during the SARS-CoV-2 public health emergency.  Safety protocols were in place, including screening questions prior to the visit, additional usage of staff PPE, and extensive cleaning of exam room while observing appropriate contact time as indicated for disinfecting solutions.

## 2019-06-01 ENCOUNTER — Encounter: Payer: Self-pay | Admitting: Internal Medicine

## 2019-06-01 ENCOUNTER — Other Ambulatory Visit: Payer: Self-pay

## 2019-06-01 ENCOUNTER — Ambulatory Visit (INDEPENDENT_AMBULATORY_CARE_PROVIDER_SITE_OTHER): Payer: Medicare Other | Admitting: Internal Medicine

## 2019-06-01 ENCOUNTER — Other Ambulatory Visit: Payer: Self-pay | Admitting: Internal Medicine

## 2019-06-01 VITALS — BP 140/88 | HR 58 | Temp 97.7°F | Resp 16 | Ht 64.0 in | Wt 279.0 lb

## 2019-06-01 DIAGNOSIS — E7849 Other hyperlipidemia: Secondary | ICD-10-CM | POA: Diagnosis not present

## 2019-06-01 DIAGNOSIS — E119 Type 2 diabetes mellitus without complications: Secondary | ICD-10-CM | POA: Diagnosis not present

## 2019-06-01 DIAGNOSIS — I10 Essential (primary) hypertension: Secondary | ICD-10-CM

## 2019-06-01 DIAGNOSIS — E042 Nontoxic multinodular goiter: Secondary | ICD-10-CM | POA: Diagnosis not present

## 2019-06-01 LAB — COMPREHENSIVE METABOLIC PANEL
ALT: 16 U/L (ref 0–35)
AST: 18 U/L (ref 0–37)
Albumin: 3.8 g/dL (ref 3.5–5.2)
Alkaline Phosphatase: 62 U/L (ref 39–117)
BUN: 12 mg/dL (ref 6–23)
CO2: 31 mEq/L (ref 19–32)
Calcium: 9.8 mg/dL (ref 8.4–10.5)
Chloride: 100 mEq/L (ref 96–112)
Creatinine, Ser: 0.66 mg/dL (ref 0.40–1.20)
GFR: 107.29 mL/min (ref >60.00)
Glucose, Bld: 102 mg/dL — ABNORMAL HIGH (ref 70–99)
Potassium: 3.7 mEq/L (ref 3.5–5.1)
Sodium: 137 mEq/L (ref 135–145)
Total Bilirubin: 0.5 mg/dL (ref 0.2–1.2)
Total Protein: 7.6 g/dL (ref 6.0–8.3)

## 2019-06-01 LAB — LIPID PANEL
Cholesterol: 174 mg/dL (ref 0–200)
HDL: 55 mg/dL (ref >39.00)
LDL Cholesterol: 102 mg/dL — ABNORMAL HIGH (ref 0–99)
NonHDL: 119.27
Total CHOL/HDL Ratio: 3
Triglycerides: 87 mg/dL (ref 0.0–149.0)
VLDL: 17.4 mg/dL (ref 0.0–40.0)

## 2019-06-01 LAB — TSH: TSH: 3.36 u[IU]/mL (ref 0.35–4.50)

## 2019-06-01 LAB — HEMOGLOBIN A1C: Hgb A1c MFr Bld: 6.5 % (ref 4.6–6.5)

## 2019-06-01 MED ORDER — ROSUVASTATIN CALCIUM 5 MG PO TABS: ORAL_TABLET | ORAL | 1 refills | Status: DC

## 2019-06-01 NOTE — Assessment & Plan Note (Addendum)
She denies obvious changes in the size of her thyroid tsh [place] of thyroid

## 2019-06-01 NOTE — Assessment & Plan Note (Signed)
Diet controlled Encourage regular exercise Doing okay with diet-maintain low sugar/carb diet Check A1c

## 2019-06-01 NOTE — Assessment & Plan Note (Signed)
Tolerating rosuvastatin twice weekly Check lipid panel, CMP

## 2019-06-01 NOTE — Assessment & Plan Note (Signed)
On average blood pressure controlled Encouraged regular exercise Continue low-sodium diet Continue current medications at current doses CMP

## 2019-06-05 ENCOUNTER — Encounter: Payer: Self-pay | Admitting: Internal Medicine

## 2019-06-10 ENCOUNTER — Encounter: Payer: Self-pay | Admitting: Internal Medicine

## 2019-06-10 ENCOUNTER — Ambulatory Visit
Admission: RE | Admit: 2019-06-10 | Discharge: 2019-06-10 | Disposition: A | Discharge status: Home / Self Care | Payer: Medicare Other | Source: Ambulatory Visit | Attending: Internal Medicine | Admitting: Internal Medicine

## 2019-06-10 DIAGNOSIS — E042 Nontoxic multinodular goiter: Secondary | ICD-10-CM

## 2019-07-06 DIAGNOSIS — L668 Other cicatricial alopecia: Secondary | ICD-10-CM | POA: Diagnosis not present

## 2019-07-10 DIAGNOSIS — Z20828 Contact with and (suspected) exposure to other viral communicable diseases: Secondary | ICD-10-CM | POA: Diagnosis not present

## 2019-07-25 DIAGNOSIS — Z23 Encounter for immunization: Secondary | ICD-10-CM | POA: Diagnosis not present

## 2019-08-22 DIAGNOSIS — Z23 Encounter for immunization: Secondary | ICD-10-CM | POA: Diagnosis not present

## 2019-09-24 ENCOUNTER — Other Ambulatory Visit: Payer: Self-pay

## 2019-09-24 ENCOUNTER — Encounter (HOSPITAL_COMMUNITY): Payer: Self-pay | Admitting: Emergency Medicine

## 2019-09-24 ENCOUNTER — Inpatient Hospital Stay (HOSPITAL_COMMUNITY)
Admission: EM | Admit: 2019-09-24 | Discharge: 2019-09-30 | DRG: 392 | Disposition: A | Discharge status: Home / Self Care | Payer: Medicare Other | Attending: Internal Medicine | Admitting: Internal Medicine

## 2019-09-24 ENCOUNTER — Emergency Department (HOSPITAL_COMMUNITY): Payer: Medicare Other

## 2019-09-24 DIAGNOSIS — Z79899 Other long term (current) drug therapy: Secondary | ICD-10-CM

## 2019-09-24 DIAGNOSIS — E119 Type 2 diabetes mellitus without complications: Secondary | ICD-10-CM | POA: Diagnosis present

## 2019-09-24 DIAGNOSIS — M8580 Other specified disorders of bone density and structure, unspecified site: Secondary | ICD-10-CM | POA: Diagnosis present

## 2019-09-24 DIAGNOSIS — R1084 Generalized abdominal pain: Secondary | ICD-10-CM | POA: Diagnosis not present

## 2019-09-24 DIAGNOSIS — Z6841 Body Mass Index (BMI) 40.0 and over, adult: Secondary | ICD-10-CM | POA: Diagnosis not present

## 2019-09-24 DIAGNOSIS — K219 Gastro-esophageal reflux disease without esophagitis: Secondary | ICD-10-CM | POA: Diagnosis present

## 2019-09-24 DIAGNOSIS — Z888 Allergy status to other drugs, medicaments and biological substances status: Secondary | ICD-10-CM | POA: Diagnosis not present

## 2019-09-24 DIAGNOSIS — K760 Fatty (change of) liver, not elsewhere classified: Secondary | ICD-10-CM | POA: Diagnosis present

## 2019-09-24 DIAGNOSIS — Z8041 Family history of malignant neoplasm of ovary: Secondary | ICD-10-CM | POA: Diagnosis not present

## 2019-09-24 DIAGNOSIS — Z803 Family history of malignant neoplasm of breast: Secondary | ICD-10-CM | POA: Diagnosis not present

## 2019-09-24 DIAGNOSIS — K5720 Diverticulitis of large intestine with perforation and abscess without bleeding: Secondary | ICD-10-CM

## 2019-09-24 DIAGNOSIS — E785 Hyperlipidemia, unspecified: Secondary | ICD-10-CM | POA: Diagnosis present

## 2019-09-24 DIAGNOSIS — R110 Nausea: Secondary | ICD-10-CM | POA: Diagnosis not present

## 2019-09-24 DIAGNOSIS — Z20822 Contact with and (suspected) exposure to covid-19: Secondary | ICD-10-CM | POA: Diagnosis present

## 2019-09-24 DIAGNOSIS — Z03818 Encounter for observation for suspected exposure to other biological agents ruled out: Secondary | ICD-10-CM | POA: Diagnosis not present

## 2019-09-24 DIAGNOSIS — I10 Essential (primary) hypertension: Secondary | ICD-10-CM | POA: Diagnosis present

## 2019-09-24 DIAGNOSIS — K5732 Diverticulitis of large intestine without perforation or abscess without bleeding: Secondary | ICD-10-CM | POA: Diagnosis not present

## 2019-09-24 DIAGNOSIS — E876 Hypokalemia: Secondary | ICD-10-CM | POA: Diagnosis present

## 2019-09-24 DIAGNOSIS — E1169 Type 2 diabetes mellitus with other specified complication: Secondary | ICD-10-CM

## 2019-09-24 DIAGNOSIS — R109 Unspecified abdominal pain: Secondary | ICD-10-CM | POA: Diagnosis not present

## 2019-09-24 DIAGNOSIS — Z800 Family history of malignant neoplasm of digestive organs: Secondary | ICD-10-CM | POA: Diagnosis not present

## 2019-09-24 DIAGNOSIS — Z96653 Presence of artificial knee joint, bilateral: Secondary | ICD-10-CM | POA: Diagnosis present

## 2019-09-24 DIAGNOSIS — Z87891 Personal history of nicotine dependence: Secondary | ICD-10-CM | POA: Diagnosis not present

## 2019-09-24 DIAGNOSIS — R52 Pain, unspecified: Secondary | ICD-10-CM | POA: Diagnosis not present

## 2019-09-24 HISTORY — DX: Diverticulitis of large intestine with perforation and abscess without bleeding: K57.20

## 2019-09-24 LAB — URINALYSIS, ROUTINE W REFLEX MICROSCOPIC
Bilirubin Urine: NEGATIVE
Glucose, UA: NEGATIVE mg/dL
Hgb urine dipstick: NEGATIVE
Ketones, ur: NEGATIVE mg/dL
Leukocytes,Ua: NEGATIVE
Nitrite: NEGATIVE
Protein, ur: NEGATIVE mg/dL
Specific Gravity, Urine: 1.02 (ref 1.005–1.030)
pH: 7 (ref 5.0–8.0)

## 2019-09-24 LAB — CBC WITH DIFFERENTIAL/PLATELET
Abs Immature Granulocytes: 0.03 10*3/uL (ref 0.00–0.07)
Basophils Absolute: 0 10*3/uL (ref 0.0–0.1)
Basophils Relative: 0 %
Eosinophils Absolute: 0 10*3/uL (ref 0.0–0.5)
Eosinophils Relative: 0 %
HCT: 42.3 % (ref 36.0–46.0)
Hemoglobin: 13.5 g/dL (ref 12.0–15.0)
Immature Granulocytes: 0 %
Lymphocytes Relative: 10 %
Lymphs Abs: 0.9 10*3/uL (ref 0.7–4.0)
MCH: 27.6 pg (ref 26.0–34.0)
MCHC: 31.9 g/dL (ref 30.0–36.0)
MCV: 86.3 fL (ref 80.0–100.0)
Monocytes Absolute: 0.8 10*3/uL (ref 0.1–1.0)
Monocytes Relative: 9 %
Neutro Abs: 7.6 10*3/uL (ref 1.7–7.7)
Neutrophils Relative %: 81 %
Platelets: 322 10*3/uL (ref 150–400)
RBC: 4.9 MIL/uL (ref 3.87–5.11)
RDW: 13.1 % (ref 11.5–15.5)
WBC: 9.4 10*3/uL (ref 4.0–10.5)
nRBC: 0 % (ref 0.0–0.2)

## 2019-09-24 LAB — COMPREHENSIVE METABOLIC PANEL
ALT: 22 U/L (ref 0–44)
AST: 20 U/L (ref 15–41)
Albumin: 3.6 g/dL (ref 3.5–5.0)
Alkaline Phosphatase: 59 U/L (ref 38–126)
Anion gap: 11 (ref 5–15)
BUN: 7 mg/dL — ABNORMAL LOW (ref 8–23)
CO2: 26 mmol/L (ref 22–32)
Calcium: 9.2 mg/dL (ref 8.9–10.3)
Chloride: 97 mmol/L — ABNORMAL LOW (ref 98–111)
Creatinine, Ser: 0.71 mg/dL (ref 0.44–1.00)
GFR calc Af Amer: >60 mL/min (ref >60)
GFR calc non Af Amer: >60 mL/min (ref >60)
Glucose, Bld: 139 mg/dL — ABNORMAL HIGH (ref 70–99)
Potassium: 3.4 mmol/L — ABNORMAL LOW (ref 3.5–5.1)
Sodium: 134 mmol/L — ABNORMAL LOW (ref 135–145)
Total Bilirubin: 0.7 mg/dL (ref 0.3–1.2)
Total Protein: 7.9 g/dL (ref 6.5–8.1)

## 2019-09-24 LAB — LIPASE, BLOOD: Lipase: 19 U/L (ref 11–51)

## 2019-09-24 LAB — SARS CORONAVIRUS 2 (TAT 6-24 HRS): SARS Coronavirus 2: NEGATIVE

## 2019-09-24 MED ORDER — ONDANSETRON HCL 4 MG/2ML IJ SOLN: 4.0000 mg | Freq: Four times a day (QID) | INTRAMUSCULAR | Status: DC | PRN

## 2019-09-24 MED ORDER — ACETAMINOPHEN 325 MG PO TABS
650.0000 mg | ORAL_TABLET | Freq: Four times a day (QID) | ORAL | Status: DC | PRN
  Administered 2019-09-28 – 2019-09-29 (×3): 650 mg via ORAL
  Filled 2019-09-24 (×4): qty 2

## 2019-09-24 MED ORDER — ONDANSETRON HCL 4 MG PO TABS: 4.0000 mg | ORAL_TABLET | Freq: Four times a day (QID) | ORAL | Status: DC | PRN

## 2019-09-24 MED ORDER — PIPERACILLIN-TAZOBACTAM 3.375 G IVPB 30 MIN
3.3750 g | Freq: Once | INTRAVENOUS | Status: AC
  Administered 2019-09-24: 3.375 g via INTRAVENOUS
  Filled 2019-09-24: qty 50

## 2019-09-24 MED ORDER — SODIUM CHLORIDE 0.9 % IV SOLN: INTRAVENOUS | Status: DC

## 2019-09-24 MED ORDER — ALBUTEROL SULFATE (2.5 MG/3ML) 0.083% IN NEBU: 2.5000 mg | INHALATION_SOLUTION | Freq: Four times a day (QID) | RESPIRATORY_TRACT | Status: DC | PRN

## 2019-09-24 MED ORDER — MORPHINE SULFATE (PF) 2 MG/ML IV SOLN
2.0000 mg | INTRAVENOUS | Status: DC | PRN
  Administered 2019-09-24 – 2019-09-28 (×17): 2 mg via INTRAVENOUS
  Filled 2019-09-24 (×17): qty 1

## 2019-09-24 MED ORDER — POTASSIUM CHLORIDE 10 MEQ/100ML IV SOLN
10.0000 meq | INTRAVENOUS | Status: AC
  Administered 2019-09-24 (×2): 10 meq via INTRAVENOUS
  Filled 2019-09-24 (×2): qty 100

## 2019-09-24 MED ORDER — SODIUM CHLORIDE 0.9 % IV BOLUS
1000.0000 mL | Freq: Once | INTRAVENOUS | Status: AC
  Administered 2019-09-24: 1000 mL via INTRAVENOUS

## 2019-09-24 MED ORDER — SODIUM CHLORIDE 0.9% FLUSH
3.0000 mL | Freq: Two times a day (BID) | INTRAVENOUS | Status: DC
  Administered 2019-09-24 – 2019-09-27 (×2): 3 mL via INTRAVENOUS

## 2019-09-24 MED ORDER — PIPERACILLIN-TAZOBACTAM 3.375 G IVPB 30 MIN: 3.3750 g | Freq: Three times a day (TID) | INTRAVENOUS | Status: DC

## 2019-09-24 MED ORDER — MORPHINE SULFATE (PF) 4 MG/ML IV SOLN
4.0000 mg | Freq: Once | INTRAVENOUS | Status: AC
  Administered 2019-09-24: 4 mg via INTRAVENOUS
  Filled 2019-09-24: qty 1

## 2019-09-24 MED ORDER — IOHEXOL 300 MG/ML  SOLN
100.0000 mL | Freq: Once | INTRAMUSCULAR | Status: AC | PRN
  Administered 2019-09-24: 100 mL via INTRAVENOUS

## 2019-09-24 MED ORDER — HYDROMORPHONE HCL 1 MG/ML IJ SOLN
0.5000 mg | Freq: Once | INTRAMUSCULAR | Status: AC
  Administered 2019-09-24: 0.5 mg via INTRAVENOUS
  Filled 2019-09-24: qty 1

## 2019-09-24 MED ORDER — ACETAMINOPHEN 650 MG RE SUPP: 650.0000 mg | Freq: Four times a day (QID) | RECTAL | Status: DC | PRN

## 2019-09-24 MED ORDER — PIPERACILLIN-TAZOBACTAM 3.375 G IVPB
3.3750 g | Freq: Three times a day (TID) | INTRAVENOUS | Status: DC
  Administered 2019-09-24 – 2019-09-30 (×17): 3.375 g via INTRAVENOUS
  Filled 2019-09-24 (×16): qty 50

## 2019-09-24 NOTE — H&P (Signed)
History and Physical    Frances Gray [id number] DOB: [birth year] DOA: 09/24/2019  Referring MD/NP/PA: Soledad Gerlach, PA-C PCP: Binnie Rail, MD  Patient coming from: Home  Chief Complaint: Abdominal pain  I have personally briefly reviewed patient's old medical records in [place]   HPI: Frances Gray is a 69 y.o. female with medical history significant of HTN, HLD, diabetes mellitus type 2, and morbid obesity presents with complaints of lower abdominal pain started yesterday afternoon around 2:30 PM.  Patient reports that she had a late breakfast prior to onset of symptoms.  Pain was described as sharp and constant.  It started in the middle of her abdomen and then moved from right to left.  Reported associated symptoms of nausea, but denied having any vomiting.  Her last bowel movement was yesterday morning.  Since that time she reports that she is not been able to pass flatus.  ED Course: Upon admission into the emergency department patient was seen to be febrile with blood pressures 142/76 and 154/84, and all other vital signs maintained.  Labs significant for sodium 134, potassium 3.4, chloride 97, and glucose 137.CT scan of the abdomen and pelvis with contrast significant for sigmoid diverticulitis with perforation without abscess.  General surgery was formally consulted.  Patient was given pain medication, 1 L of normal saline IV fluids, Zofran, and empiric antibiotics of Zosyn.  TRH called to admit.  Review of Systems  Constitutional: Negative for fever and malaise/fatigue.  HENT: Negative for congestion and nosebleeds.   Eyes: Negative for photophobia and pain.  Respiratory: Negative for cough and shortness of breath.   Cardiovascular: Negative for chest pain and leg swelling.  Gastrointestinal: Positive for abdominal pain and nausea. Negative for vomiting.  Genitourinary: Negative for dysuria and hematuria.  Musculoskeletal: Negative for myalgias.  Skin: Negative for  rash.  Neurological: Negative for focal weakness and loss of consciousness.  Endo/Heme/Allergies: Negative for environmental allergies.  Psychiatric/Behavioral: The patient is not nervous/anxious.     Past Medical History:  Diagnosis Date  . Allergic rhinitis, seasonal    takes Claritin daily as needed and uses Flonase if needed  . Allergy   . 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 Diverticulitis   . GERD (gastroesophageal reflux disease)    doesn't take any meds  . Heart murmur    no symptoms  . History of bronchitis winter of 2014  . History of colon polyps    benign  . History of kidney stones   . Hyperlipidemia   . Hypertension    takes Hyzaar daily  . Vitamin D deficiency disease 08/14/2011   takes Vit D daily    Past Surgical History:  Procedure Laterality Date  . BREAST BIOPSY Left   . Breast ultrasound Right 03/02/13   The 36mm complex cyst in the right breast is probably benign. Follow-up in 6 months with bilateral mammogram and (R) ultrasound  . Breast Ultrasound Right 09/02/13   There is a benign 3 mm complex cyst in the (R) breast at 9 o'clock  . COLONOSCOPY    . ESOPHAGUS SURGERY  1996   tumor removed   (benign)  . KNEE ARTHROSCOPY     bilateral  . LITHOTRIPSY  08/2011   R ureteral stone  . POLYPECTOMY    . TOTAL KNEE ARTHROPLASTY Left 11/16/2012   Procedure: TOTAL KNEE ARTHROPLASTY- left;  Surgeon: Johnny Bridge, MD;  Location: [facility] [place];  Service: Orthopedics;  Laterality: Left;  . TOTAL KNEE ARTHROPLASTY Right 12/06/2014   Procedure: TOTAL KNEE ARTHROPLASTY;  Surgeon: Marchia Bond, MD;  Location: [place] [facility];  Service: Orthopedics;  Laterality: Right;     reports that she has quit smoking. She has a 2.50 pack-year smoking history. She has never used smokeless tobacco. She reports that she does not drink alcohol or use drugs.  Allergies  Allergen Reactions  . Amlodipine Cough  . Metformin And Related     diarrhea  . Pravastatin     Joint ache     Family History  Problem Relation Age of Onset  . Ovarian cancer Mother   . Colon cancer Sister 20  . Breast cancer Sister   . Esophageal cancer Neg Hx   . Colon polyps Neg Hx   . Rectal cancer Neg Hx   . Stomach cancer Neg Hx     Prior to Admission medications   Medication Sig Start Date End Date Taking? Authorizing Provider  calcium carbonate (CALCIUM 600) 600 MG TABS tablet Take 1 tablet (600 mg total) by mouth 2 (two) times daily with a meal. 05/12/14  Yes Rowe Clack, MD  Cholecalciferol (VITAMIN D3) 2000 UNITS TABS Take 2,000 Units by mouth every morning.    Yes [provider]  Fluocinolone Acetonide Scalp 0.01 % OIL Apply 1 mL topically at bedtime as needed (itchy scalp).  08/31/18  Yes [provider]  fluticasone (FLONASE) 50 MCG/ACT nasal spray Place 1 spray into both nostrils daily as needed for allergies or rhinitis.  10/05/14  Yes [provider]  losartan-hydrochlorothiazide (HYZAAR) 100-25 MG tablet TAKE 1 TABLET BY MOUTH EVERY DAY Patient taking differently: Take 1 tablet by mouth daily.  06/01/19  Yes Burns, Claudina Lick, MD  Multiple Vitamin (MULTIVITAMIN) tablet Take 1 tablet by mouth every morning.    Yes [provider]  rosuvastatin (CRESTOR) 5 MG tablet TAKE 1 TABLET TWICE A WEEK Patient taking differently: Take 5 mg by mouth 2 (two) times a week. TAKE 1 TABLET TWICE A WEEK Monday, Friday 06/01/19  Yes Burns, Claudina Lick, MD  sennosides-docusate sodium (SENOKOT-S) 8.6-50 MG tablet Take 1 tablet by mouth at bedtime. 11/19/17  Yes Binnie Rail, MD    Physical Exam:  Constitutional: Elderly female currently in NAD, calm, comfortable Vitals:   09/24/19 1021 09/24/19 1022 09/24/19 1045  BP: (!) 142/76  (!) 154/84  Pulse: 62  62  Resp: 16  15  Temp: 98 F (36.7 C)    TempSrc: Oral    SpO2: 98%  99%  Weight:  126.6 kg   Height:  5\' 4"  (1.626 m)    Eyes: PERRL, lids and conjunctivae normal ENMT: Mucous membranes are moist.  Posterior pharynx clear of any exudate or lesions.Normal dentition.  Neck: normal, supple, no masses, no thyromegaly Respiratory: clear to auscultation bilaterally, no wheezing, no crackles. Normal respiratory effort. No accessory muscle use.  Cardiovascular: Regular rate and rhythm, no murmurs / rubs / gallops. No extremity edema. 2+ pedal pulses. No carotid bruits.  Abdomen: no tenderness, no masses palpated. No hepatosplenomegaly. Bowel sounds positive.  Musculoskeletal: no clubbing / cyanosis. No joint deformity upper and lower extremities. Good ROM, no contractures. Normal muscle tone.  Skin: no rashes, lesions, ulcers. No induration Neurologic: CN 2-12 grossly intact. Sensation intact, DTR normal. Strength 5/5 in all 4.  Psychiatric: Normal judgment and insight. Alert and oriented x 3. Normal mood.     Labs on Admission: I have  personally reviewed following labs and imaging studies  CBC: Recent Labs  Lab 09/24/19 1031  WBC 9.4  NEUTROABS 7.6  HGB 13.5  HCT 42.3  MCV 86.3  PLT [id number]   Basic Metabolic Panel: Recent Labs  Lab 09/24/19 1031  NA 134*  K 3.4*  CL 97*  CO2 26  GLUCOSE 139*  BUN 7*  CREATININE 0.71  CALCIUM 9.2   GFR: Estimated Creatinine Clearance: 87.5 mL/min (by C-G formula based on SCr of 0.71 mg/dL). Liver Function Tests: Recent Labs  Lab 09/24/19 1031  AST 20  ALT 22  ALKPHOS 59  BILITOT 0.7  PROT 7.9  ALBUMIN 3.6   Recent Labs  Lab 09/24/19 1031  LIPASE 19   No results for input(s): AMMONIA in the last 168 hours. Coagulation Profile: No results for input(s): INR, PROTIME in the last 168 hours. Cardiac Enzymes: No results for input(s): CKTOTAL, CKMB, CKMBINDEX, TROPONINI in the last 168 hours. BNP (last 3 results) No results for input(s): PROBNP in the last 8760 hours. HbA1C: No results for input(s): HGBA1C in the last 72 hours. CBG: No results for input(s): GLUCAP in the last 168 hours. Lipid Profile: No results for input(s):  CHOL, HDL, LDLCALC, TRIG, CHOLHDL, LDLDIRECT in the last 72 hours. Thyroid Function Tests: No results for input(s): TSH, T4TOTAL, FREET4, T3FREE, THYROIDAB in the last 72 hours. Anemia Panel: No results for input(s): VITAMINB12, FOLATE, FERRITIN, TIBC, IRON, RETICCTPCT in the last 72 hours. Urine analysis:    Component Value Date/Time   COLORURINE YELLOW 09/24/2019 1310   APPEARANCEUR HAZY (A) 09/24/2019 1310   LABSPEC 1.020 09/24/2019 1310   PHURINE 7.0 09/24/2019 1310   GLUCOSEU NEGATIVE 09/24/2019 1310   GLUCOSEU NEGATIVE 05/12/2014 1120   HGBUR NEGATIVE 09/24/2019 1310   BILIRUBINUR NEGATIVE 09/24/2019 1310   KETONESUR NEGATIVE 09/24/2019 1310   PROTEINUR NEGATIVE 09/24/2019 1310   UROBILINOGEN 0.2 05/12/2014 1120   NITRITE NEGATIVE 09/24/2019 1310   LEUKOCYTESUR NEGATIVE 09/24/2019 1310   Sepsis Labs: No results found for this or any previous visit (from the past 240 hour(s)).   Radiological Exams on Admission: CT ABDOMEN PELVIS W CONTRAST  Result Date: 09/24/2019 CLINICAL DATA:  Left abdominal pain and nausea. History of kidney stones and diverticulitis. EXAM: CT ABDOMEN AND PELVIS WITH CONTRAST TECHNIQUE: Multidetector CT imaging of the abdomen and pelvis was performed using the standard protocol following bolus administration of intravenous contrast. CONTRAST:  124mL OMNIPAQUE IOHEXOL 300 MG/ML  SOLN COMPARISON:  Abdomen radiograph dated 09/18/2011. FINDINGS: Lower chest: Minimal bibasilar atelectasis/scarring. Hepatobiliary: Diffuse low density of the liver relative to the spleen. Normal appearing gallbladder. Pancreas: Unremarkable. No pancreatic ductal dilatation or surrounding inflammatory changes. Spleen: Normal in size without focal abnormality. Adrenals/Urinary Tract: Adrenal glands are unremarkable. Kidneys are normal, without renal calculi, focal lesion, or hydronephrosis. Bladder is unremarkable. Stomach/Bowel: Small hiatal hernia. Normal appearing small bowel and  appendix. Multiple sigmoid colon diverticula. One diverticulum is larger with multiple small locules of adjacent and nearby extra limb air and mild soft tissue stranding. No fluid collection seen. Vascular/Lymphatic: No significant vascular findings are present. No enlarged abdominal or pelvic lymph nodes. Reproductive: Uterus and bilateral adnexa are unremarkable. Other: No abdominal wall hernia or abnormality. No abdominopelvic ascites. Musculoskeletal: Lumbar and lower thoracic spine degenerative changes. IMPRESSION: 1. Acute, perforated sigmoid diverticulitis without abscess. A perforated sigmoid colon neoplasm is less likely but not excluded. 2. Small hiatal hernia. 3. Diffuse hepatic steatosis. Electronically Signed   By: Percell Locus.D.  On: 09/24/2019 13:05    EKG: Independently reviewed.  Sinus rhythm at 65 beats per  Assessment/Plan Sigmoid diverticulitis with perforation: Acute.  Patient presented with complaints of abdominal pain.  CT scan of the abdomen pelvis revealed signs of sigmoid diverticulitis with acute perforation without signs of abscess.  General surgery formally consulted. -Admit to a medical telemetry bed -N.p.o. to allow for bowel rest -Continue Zosyn IV -IV morphine as needed for pain -Antiemetics as needed -Normal saline IV fluids at 75 mL/h -Appreciate general surgery consultative services, we will follow-up for further recommendations  Hypokalemia: Acute.  Potassium initially mildly low at 3.4. -Give 20 mEq of potassium chloride IV -Continue to monitor and replace as needed  Essential hypertension: Home blood pressure medications include losartan hydrochlorothiazide 100-25 mg daily. -Restart when tolerating p.o.  Diabetes mellitus type 2, well controlled: Last hemoglobin A1c 6.5 on 06/01/2019.  Patient not on any diabetic medications and initial glucose 135. -Continue to monitor  Hyperlipidemia: Home medications include Crestor 5 mg twice weekly -Restart  Crestor when patient tolerating p.o.  Morbid obesity: BMI 47.89 kg/m  DVT prophylaxis: SCDs Code Status: Full Family Communication:husband updated Disposition Plan: Likely discharge home once medically stable Consults called: General surgery Admission status: Inpatient requiring at least 2 midnight stay for IV antibiotics  Norval Morton MD [facility] Pager [phone]   If 7PM-7AM, please contact night-coverage [email or web address] Password [email or web address]  09/24/2019, 1:58 PM

## 2019-09-24 NOTE — Consult Note (Signed)
Frances Gray [birth year]  [id number].    Requesting MD: Dr. Pattricia Boss Chief Complaint/Reason for Consult: diverticulitis with microperforation  HPI:  This is a 69 yo black female with a history of HTN who underwent a colonoscopy in June of 2020 by Dr. Silverio Decamp.  She had a few polyps but otherwise just diverticulosis.  She has never had diverticulitis before. Yesterday around 1400 she began having suprapubic and LLQ abdominal pain.  She said she felt like she needed to void and have a BM but couldn't.  She was later able to urinate, but her last BM was yesterday morning before the pain. Nothing seems to make the pain better.  She denies any blood per rectum.  She had some nausea this morning when they called EMS, but none before or sense.  She denies vomiting or any fevers. She presented to [facility] where she has a normal WBC and is AF.  Her CT scan reveals diverticulitis with a contained microperforation of her sigmoid colon without abscess.  We have been asked to see her for further evaluation and recommendations.  ROS: ROS: Please see HPI, otherwise all other systems have been reviewed and are negative.  Family History  Problem Relation Age of Onset  . Ovarian cancer Mother   . Colon cancer Sister 2  . Breast cancer Sister   . Esophageal cancer Neg Hx   . Colon polyps Neg Hx   . Rectal cancer Neg Hx   . Stomach cancer Neg Hx     Past Medical History:  Diagnosis Date  . Allergic rhinitis, seasonal    takes Claritin daily as needed and uses Flonase if needed  . Allergy   . 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 Diverticulitis   . GERD (gastroesophageal reflux disease)    doesn't take any meds  . Heart murmur    no symptoms  . History of bronchitis winter of 2014  . History of colon polyps    benign  . History of kidney stones   . Hyperlipidemia   . Hypertension    takes Hyzaar daily  . Vitamin D deficiency disease 08/14/2011   takes Vit D daily     Past Surgical History:  Procedure Laterality Date  . BREAST BIOPSY Left   . Breast ultrasound Right 03/02/13   The 31mm complex cyst in the right breast is probably benign. Follow-up in 6 months with bilateral mammogram and (R) ultrasound  . Breast Ultrasound Right 09/02/13   There is a benign 3 mm complex cyst in the (R) breast at 9 o'clock  . COLONOSCOPY    . ESOPHAGUS SURGERY  1996   tumor removed   (benign)  . KNEE ARTHROSCOPY     bilateral  . LITHOTRIPSY  08/2011   R ureteral stone  . POLYPECTOMY    . TOTAL KNEE ARTHROPLASTY Left 11/16/2012   Procedure: TOTAL KNEE ARTHROPLASTY- left;  Surgeon: Johnny Bridge, MD;  Location: [place];  Service: Orthopedics;  Laterality: Left;  . TOTAL KNEE ARTHROPLASTY Right 12/06/2014   Procedure: TOTAL KNEE ARTHROPLASTY;  Surgeon: Marchia Bond, MD;  Location: [place];  Service: Orthopedics;  Laterality: Right;    Social History:  reports that she has quit smoking. She has a 2.50 pack-year smoking history. She has never used smokeless tobacco. She reports that she does not drink alcohol or use drugs.  Allergies:  Allergies  Allergen Reactions  . Amlodipine Cough  .  Metformin And Related     diarrhea  . Pravastatin     Joint ache    (Not in a hospital admission)    Physical Exam: Blood pressure (!) 151/68, pulse 66, temperature 98 F (36.7 C), temperature source Oral, resp. rate (!) 27, height 5\' 4"  (1.626 m), weight 126.6 kg, SpO2 97 %. General: pleasant, obese black female who is laying in bed in NAD HEENT: head is normocephalic, atraumatic.  Sclera are noninjected.  PERRL.  Ears and nose without any masses or lesions.  Mouth is pink and moist Heart: regular, rate, and rhythm.  Normal s1,s2. No obvious murmurs, gallops, or rubs noted.  Palpable radial and pedal pulses bilaterally Lungs: CTAB, no wheezes, rhonchi, or rales noted.  Respiratory effort nonlabored Abd: soft, tender in suprapubic region and LLQ, but no guarding or rebounding,  ND, obese, +BS, no masses, hernias, or organomegaly MS: all 4 extremities are symmetrical with no cyanosis, clubbing, or edema. Skin: warm and dry with no masses, lesions, or rashes Neuro: Cranial nerves 2-12 grossly intact, sensation is normal throughout Psych: A&Ox3 with an appropriate affect.   Results for orders placed or performed during the hospital encounter of 09/24/19 (from the past 48 hour(s))  CBC with Differential/Platelet     Status: None   Collection Time: 09/24/19 10:31 AM  Result Value Ref Range   WBC 9.4 4.0 - 10.5 K/uL   RBC 4.90 3.87 - 5.11 MIL/uL   Hemoglobin 13.5 12.0 - 15.0 g/dL   HCT 42.3 36.0 - 46.0 %   MCV 86.3 80.0 - 100.0 fL   MCH 27.6 26.0 - 34.0 pg   MCHC 31.9 30.0 - 36.0 g/dL   RDW 13.1 11.5 - 15.5 %   Platelets 322 150 - 400 K/uL   nRBC 0.0 0.0 - 0.2 %   Neutrophils Relative % 81 %   Neutro Abs 7.6 1.7 - 7.7 K/uL   Lymphocytes Relative 10 %   Lymphs Abs 0.9 0.7 - 4.0 K/uL   Monocytes Relative 9 %   Monocytes Absolute 0.8 0.1 - 1.0 K/uL   Eosinophils Relative 0 %   Eosinophils Absolute 0.0 0.0 - 0.5 K/uL   Basophils Relative 0 %   Basophils Absolute 0.0 0.0 - 0.1 K/uL   Immature Granulocytes 0 %   Abs Immature Granulocytes 0.03 0.00 - 0.07 K/uL    Comment: Performed at [facility], [address], [place], [place] [address]  Comprehensive metabolic panel     Status: Abnormal   Collection Time: 09/24/19 10:31 AM  Result Value Ref Range   Sodium 134 (L) 135 - 145 mmol/L   Potassium 3.4 (L) 3.5 - 5.1 mmol/L   Chloride 97 (L) 98 - 111 mmol/L   CO2 26 22 - 32 mmol/L   Glucose, Bld 139 (H) 70 - 99 mg/dL    Comment: Glucose reference range applies only to samples taken after fasting for at least 8 hours.   BUN 7 (L) 8 - 23 mg/dL   Creatinine, Ser 0.71 0.44 - 1.00 mg/dL   Calcium 9.2 8.9 - 10.3 mg/dL   Total Protein 7.9 6.5 - 8.1 g/dL   Albumin 3.6 3.5 - 5.0 g/dL   AST 20 15 - 41 U/L   ALT 22 0 - 44 U/L   Alkaline Phosphatase 59 38 - 126  U/L   Total Bilirubin 0.7 0.3 - 1.2 mg/dL   GFR calc non Af Amer >60 >60 mL/min   GFR calc Af  Amer >60 >60 mL/min   Anion gap 11 5 - 15    Comment: Performed at [facility] [address] [place], [place] [address]  Lipase, blood     Status: None   Collection Time: 09/24/19 10:31 AM  Result Value Ref Range   Lipase 19 11 - 51 U/L    Comment: Performed at [facility] [address], [place], [place] [address]  Urinalysis, Routine w reflex microscopic     Status: Abnormal   Collection Time: 09/24/19  1:10 PM  Result Value Ref Range   Color, Urine YELLOW YELLOW   APPearance HAZY (A) CLEAR   Specific Gravity, Urine 1.020 1.005 - 1.030   pH 7.0 5.0 - 8.0   Glucose, UA NEGATIVE NEGATIVE mg/dL   Hgb urine dipstick NEGATIVE NEGATIVE   Bilirubin Urine NEGATIVE NEGATIVE   Ketones, ur NEGATIVE NEGATIVE mg/dL   Protein, ur NEGATIVE NEGATIVE mg/dL   Nitrite NEGATIVE NEGATIVE   Leukocytes,Ua NEGATIVE NEGATIVE    Comment: Performed at [facility] [address], [place], [place] [address]   CT ABDOMEN PELVIS W CONTRAST  Result Date: 09/24/2019 CLINICAL DATA:  Left abdominal pain and nausea. History of kidney stones and diverticulitis. EXAM: CT ABDOMEN AND PELVIS WITH CONTRAST TECHNIQUE: Multidetector CT imaging of the abdomen and pelvis was performed using the standard protocol following bolus administration of intravenous contrast. CONTRAST:  118mL OMNIPAQUE IOHEXOL 300 MG/ML  SOLN COMPARISON:  Abdomen radiograph dated 09/18/2011. FINDINGS: Lower chest: Minimal bibasilar atelectasis/scarring. Hepatobiliary: Diffuse low density of the liver relative to the spleen. Normal appearing gallbladder. Pancreas: Unremarkable. No pancreatic ductal dilatation or surrounding inflammatory changes. Spleen: Normal in size without focal abnormality. Adrenals/Urinary Tract: Adrenal glands are unremarkable. Kidneys are normal, without renal calculi, focal lesion, or hydronephrosis. Bladder is  unremarkable. Stomach/Bowel: Small hiatal hernia. Normal appearing small bowel and appendix. Multiple sigmoid colon diverticula. One diverticulum is larger with multiple small locules of adjacent and nearby extra limb air and mild soft tissue stranding. No fluid collection seen. Vascular/Lymphatic: No significant vascular findings are present. No enlarged abdominal or pelvic lymph nodes. Reproductive: Uterus and bilateral adnexa are unremarkable. Other: No abdominal wall hernia or abnormality. No abdominopelvic ascites. Musculoskeletal: Lumbar and lower thoracic spine degenerative changes. IMPRESSION: 1. Acute, perforated sigmoid diverticulitis without abscess. A perforated sigmoid colon neoplasm is less likely but not excluded. 2. Small hiatal hernia. 3. Diffuse hepatic steatosis. Electronically Signed   By: Claudie Revering M.D.   On: 09/24/2019 13:05      Assessment/Plan HTN Obesity  Diverticulitis with contained microperforation The patient has diverticulitis with a contained microperforation.  She does not have any evidence of an abscess or free air.  She is tender but with no evidence of peritonitis.  She will be admitted for IV abx therapy and conservative management.  She will be NPO x ice chips for now for bowel rest.  Her imaging and chart have been personally reviewed.  We will continue to follow the patient with you.  We did discuss the possibility that if she worsens she may need a repeat scan to rule out abscess vs surgery and colostomy.  She understands.  FEN - NPO x ice/IVFs VTE - ok for chemical prophylaxis from our standpoint ID - [email or web address]   Henreitta Cea, [facility] 09/24/2019, 2:23 PM Please see Amion for pager number during day hours 7:00am-4:30pm or 7:00am -11:30am on weekends

## 2019-09-24 NOTE — ED Triage Notes (Signed)
Pt BIB [facility] from home. Pt complaining of squeezing abdominal pain x19 hours. Pt with history of diverticulitis. Pt received 4 mg zofran via EMS with relief. VSS. NAD.

## 2019-09-24 NOTE — ED Provider Notes (Signed)
[facility] EMERGENCY DEPARTMENT Provider Note   CSN: [id number] Arrival date & time: 09/24/19  1015     History Chief Complaint  Patient presents with   Abdominal Pain    Frances Gray is a 69 y.o. female.  Patient with history of diverticulitis, kidney stones presents the emergency department today with complaint of abdominal pain.  Patient developed abdominal pain in the lower mid abdomen yesterday at around 2 PM.  She describes the pain as a pressure like she needs to pass gas.  It is constant.  She ate a late breakfast at around 11 AM prior to that.  By 6 PM the pain was becoming more severe and she went to bed.  She ate yogurt around midnight at the urging of her husband.  This is her last oral intake.  Pain is generalized in the abdomen but worse on the left side.  She states that reminds her vaguely of her previous episode of diverticulitis but this is more severe.  EMS was called for transport to the hospital.  Patient reports significant discomfort with going over bumps in the ambulance.  She has had nausea but no vomiting.  Last bowel movement was yesterday morning, none since and is not passing gas.  She denies any significant urinary symptoms.  No treatments prior to arrival other than Zofran by EMS.        Past Medical History:  Diagnosis Date   Allergic rhinitis, seasonal    takes Claritin daily as needed and uses Flonase if needed   Allergy    Arthritis    L knee s/p TKR 6/14, R knee s/p TKR 6/16   Diverticulitis    GERD (gastroesophageal reflux disease)    doesn't take any meds   Heart murmur    no symptoms   History of bronchitis winter of 2014   History of colon polyps    benign   History of kidney stones    Hyperlipidemia    Hypertension    takes Hyzaar daily   Vitamin D deficiency disease 08/14/2011   takes Vit D daily    Patient Active Problem List   Diagnosis Date Noted   Plantar fasciitis, right 12/05/2016    Diabetes mellitus without complication ([place]) 99991111   Nodule of flexor tendon sheath 01/23/2016   Midline low back pain without sciatica 01/23/2016   Knee osteoarthritis 12/06/2014   Hyperlipidemia    Osteopenia 12/29/2013   Severe obesity (BMI >= 40) (HCC) 11/16/2012   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 Follow-up in 6 months with bilateral mammogram and (R) ultrasound   Breast Ultrasound Right 09/02/13   There is a benign 3 mm complex cyst in the (R) breast at 9 o'clock   [place]   tumor removed   (benign)   KNEE ARTHROSCOPY     bilateral   LITHOTRIPSY  08/2011   R ureteral stone   POLYPECTOMY     TOTAL KNEE ARTHROPLASTY Left 11/16/2012   Procedure: TOTAL KNEE ARTHROPLASTY- left;  Surgeon: Johnny Bridge, MD;  Location: [facility];  Service: Orthopedics;  Laterality: Left;   TOTAL KNEE ARTHROPLASTY Right 12/06/2014   Procedure: TOTAL KNEE ARTHROPLASTY;  Surgeon: Marchia Bond, MD;  Location: [facility];  Service: Orthopedics;  Laterality: Right;     OB History   No obstetric history on file.     Family History  Problem Relation Age of Onset   Ovarian cancer Mother    Colon cancer Sister 52   Breast cancer Sister    Esophageal cancer Neg Hx    Colon polyps Neg Hx    Rectal cancer Neg Hx    Stomach cancer Neg Hx     Social History   Tobacco Use   Smoking status: Former Smoker    Packs/day: 0.25    Years: 10.00    Pack years: 2.50   Smokeless tobacco: Never Used   Tobacco comment: quit smoking 34yrs ago  Substance Use Topics   Alcohol use: No   Drug use: No    Home  Medications Prior to Admission medications   Medication Sig Start Date End Date Taking? Authorizing Provider  calcium carbonate (CALCIUM 600) 600 MG TABS tablet Take 1 tablet (600 mg total) by mouth 2 (two) times daily with a meal. 05/12/14   Rowe Clack, MD  Cholecalciferol (VITAMIN D3) 2000 UNITS TABS Take 2,000 Units by mouth every morning.     [provider]  Fluocinolone Acetonide Scalp 0.01 % OIL 1 ML AS DIRECTED APPLY TO SCALP EVERY NIGHT AS NEEDED FOR AN ITCHY SCALP. 08/31/18   [provider]  fluticasone (FLONASE) 50 MCG/ACT nasal spray Place 1 spray into both nostrils daily as needed for allergies or rhinitis.  10/05/14   [provider]  losartan-hydrochlorothiazide (HYZAAR) 100-25 MG tablet TAKE 1 TABLET BY MOUTH EVERY DAY 06/01/19   Binnie Rail, MD  Multiple Vitamin (MULTIVITAMIN) tablet Take 1 tablet by mouth every morning.     [provider]  rosuvastatin (CRESTOR) 5 MG tablet TAKE 1 TABLET TWICE A WEEK 06/01/19   Burns, Claudina Lick, MD  sennosides-docusate sodium (SENOKOT-S) 8.6-50 MG tablet Take 1 tablet by mouth at bedtime. 11/19/17   Binnie Rail, MD    Allergies    Amlodipine, Metformin and related, and Pravastatin  Review of Systems   Review of Systems  Constitutional: Negative for fever.  HENT: Negative for rhinorrhea and sore throat.   Eyes: Negative for redness.  Respiratory: Negative for cough and shortness of breath.   Cardiovascular: Negative for chest pain and leg swelling.  Gastrointestinal: Positive for abdominal pain and nausea. Negative for blood in stool, diarrhea and vomiting.  Genitourinary: Negative for decreased urine volume, dysuria and hematuria.  Musculoskeletal: Negative for myalgias.  Skin: Negative for rash.  Neurological: Negative for headaches.    Physical Exam Updated Vital Signs BP (!) 142/76 (BP Location: Right Arm)    Pulse 62    Temp 98 F (36.7 C) (Oral)    Resp 16    Ht 5\' 4"  (1.626 m)     Wt 126.6 kg    SpO2 98%    BMI 47.89 kg/m   Physical Exam Vitals and nursing note reviewed.  Constitutional:      General: She is in acute distress (Moaning in pain, appears uncomfortable).     Appearance: She is well-developed. She is not toxic-appearing.  HENT:     Head: Normocephalic and atraumatic.  Eyes:     General:        Right eye: No discharge.  Left eye: No discharge.     Conjunctiva/sclera: Conjunctivae normal.  Cardiovascular:     Rate and Rhythm: Normal rate and regular rhythm.     Heart sounds: Normal heart sounds.  Pulmonary:     Effort: Pulmonary effort is normal. No respiratory distress.     Breath sounds: Normal breath sounds.  Abdominal:     Palpations: Abdomen is soft.     Tenderness: There is abdominal tenderness.     Comments: Patient has pain generally in the abdomen but seems to be more significant in the left lateral and left lower quadrants.  She does not have any significant rebound or guarding.  Musculoskeletal:     Cervical back: Normal range of motion and neck supple.  Skin:    General: Skin is warm and dry.  Neurological:     Mental Status: She is alert.     ED Results / Procedures / Treatments   Labs (all labs ordered are listed, but only abnormal results are displayed) Labs Reviewed  COMPREHENSIVE METABOLIC PANEL - Abnormal; Notable for the following components:      Result Value   Sodium 134 (*)    Potassium 3.4 (*)    Chloride 97 (*)    Glucose, Bld 139 (*)    BUN 7 (*)    All other components within normal limits  URINALYSIS, ROUTINE W REFLEX MICROSCOPIC - Abnormal; Notable for the following components:   APPearance HAZY (*)    All other components within normal limits  CULTURE, BLOOD (ROUTINE X 2)  CULTURE, BLOOD (ROUTINE X 2)  SARS CORONAVIRUS 2 (TAT 6-24 HRS)  CBC WITH DIFFERENTIAL/PLATELET  LIPASE, BLOOD  HIV ANTIBODY (ROUTINE TESTING W REFLEX)    ED ECG REPORT   Date: 09/24/2019  Rate: 65  Rhythm: normal sinus  rhythm  QRS Axis: normal  Intervals: normal  ST/T Wave abnormalities: normal  Conduction Disutrbances: none  Narrative Interpretation:   Old EKG Reviewed: changes noted, improvement in non-specific T wave changes from 2016  I have personally reviewed the EKG tracing and agree with the computerized printout as noted.  Radiology CT ABDOMEN PELVIS W CONTRAST  Result Date: 09/24/2019 CLINICAL DATA:  Left abdominal pain and nausea. History of kidney stones and diverticulitis. EXAM: CT ABDOMEN AND PELVIS WITH CONTRAST TECHNIQUE: Multidetector CT imaging of the abdomen and pelvis was performed using the standard protocol following bolus administration of intravenous contrast. CONTRAST:  174mL OMNIPAQUE IOHEXOL 300 MG/ML  SOLN COMPARISON:  Abdomen radiograph dated 09/18/2011. FINDINGS: Lower chest: Minimal bibasilar atelectasis/scarring. Hepatobiliary: Diffuse low density of the liver relative to the spleen. Normal appearing gallbladder. Pancreas: Unremarkable. No pancreatic ductal dilatation or surrounding inflammatory changes. Spleen: Normal in size without focal abnormality. Adrenals/Urinary Tract: Adrenal glands are unremarkable. Kidneys are normal, without renal calculi, focal lesion, or hydronephrosis. Bladder is unremarkable. Stomach/Bowel: Small hiatal hernia. Normal appearing small bowel and appendix. Multiple sigmoid colon diverticula. One diverticulum is larger with multiple small locules of adjacent and nearby extra limb air and mild soft tissue stranding. No fluid collection seen. Vascular/Lymphatic: No significant vascular findings are present. No enlarged abdominal or pelvic lymph nodes. Reproductive: Uterus and bilateral adnexa are unremarkable. Other: No abdominal wall hernia or abnormality. No abdominopelvic ascites. Musculoskeletal: Lumbar and lower thoracic spine degenerative changes. IMPRESSION: 1. Acute, perforated sigmoid diverticulitis without abscess. A perforated sigmoid colon neoplasm  is less likely but not excluded. 2. Small hiatal hernia. 3. Diffuse hepatic steatosis. Electronically Signed   By: Percell Locus.D.  On: 09/24/2019 13:05    Procedures Procedures (including critical care time)  Medications Ordered in ED Medications  piperacillin-tazobactam (ZOSYN) IVPB 3.375 g (has no administration in time range)  HYDROmorphone (DILAUDID) injection 0.5 mg (has no administration in time range)  morphine 4 MG/ML injection 4 mg (4 mg Intravenous Given 09/24/19 1100)  sodium chloride 0.9 % bolus 1,000 mL (0 mLs Intravenous Stopped 09/24/19 1318)  iohexol (OMNIPAQUE) 300 MG/ML solution 100 mL (100 mLs Intravenous Contrast Given 09/24/19 1241)    ED Course  I have reviewed the triage vital signs and the nursing notes.  Pertinent labs & imaging results that were available during my care of the patient were reviewed by me and considered in my medical decision making (see chart for details).  Patient seen and examined. Work-up initiated. Medications ordered.  Patient has significant discomfort.  CBC with normal white blood cell count, however would like to evaluate for kidney stone or diverticulitis given amount of discomfort.  Patient agrees to proceed with CT imaging.  Fluids bolus ordered.  Vital signs reviewed and are as follows: BP (!) 142/76 (BP Location: Right Arm)    Pulse 62    Temp 98 F (36.7 C) (Oral)    Resp 16    Ht 5\' 4"  (1.626 m)    Wt 126.6 kg    SpO2 98%    BMI 47.89 kg/m   1:59 PM reviewed CT images and radiology report.  Patient has a perforated sigmoid diverticulitis.  Patient will be started on piperacillin tazobactam.  Blood culture sent.   I spoke with Maximiano Coss of general surgery who agrees to consult on patient.  I spoke with Dr. Tamala Julian of [facility] who will admit.  CRITICAL CARE Performed by: Carlisle Cater PA-C Total critical care time: 35 minutes Critical care time was exclusive of separately billable procedures and treating other  patients. Critical care was necessary to treat or prevent imminent or life-threatening deterioration. Critical care was time spent personally by me on the following activities: development of treatment plan with patient and/or surrogate as well as nursing, discussions with consultants, evaluation of patient's response to treatment, examination of patient, obtaining history from patient or surrogate, ordering and performing treatments and interventions, ordering and review of laboratory studies, ordering and review of radiographic studies, pulse oximetry and re-evaluation of patient's condition.     MDM Rules/Calculators/A&P                      Admit.    Final Clinical Impression(s) / ED Diagnoses Final diagnoses:  Diverticulitis of large intestine with perforation without bleeding    Rx / DC Orders ED Discharge Orders    None       Carlisle Cater, PA-C 09/24/19 1638    Pattricia Boss, MD 09/27/19 1202

## 2019-09-25 LAB — LIPID PANEL
Cholesterol: 168 mg/dL (ref 0–200)
HDL: 63 mg/dL (ref >40)
LDL Cholesterol: 92 mg/dL (ref 0–99)
Total CHOL/HDL Ratio: 2.7 RATIO
Triglycerides: 65 mg/dL (ref <150)
VLDL: 13 mg/dL (ref 0–40)

## 2019-09-25 LAB — HEMOGLOBIN A1C
Hgb A1c MFr Bld: 6.5 % — ABNORMAL HIGH (ref 4.8–5.6)
Mean Plasma Glucose: 139.85 mg/dL

## 2019-09-25 LAB — BASIC METABOLIC PANEL
Anion gap: 7 (ref 5–15)
BUN: 6 mg/dL — ABNORMAL LOW (ref 8–23)
CO2: 28 mmol/L (ref 22–32)
Calcium: 8.7 mg/dL — ABNORMAL LOW (ref 8.9–10.3)
Chloride: 103 mmol/L (ref 98–111)
Creatinine, Ser: 0.7 mg/dL (ref 0.44–1.00)
GFR calc Af Amer: >60 mL/min (ref >60)
GFR calc non Af Amer: >60 mL/min (ref >60)
Glucose, Bld: 133 mg/dL — ABNORMAL HIGH (ref 70–99)
Potassium: 3.3 mmol/L — ABNORMAL LOW (ref 3.5–5.1)
Sodium: 138 mmol/L (ref 135–145)

## 2019-09-25 LAB — CBC
HCT: 38.7 % (ref 36.0–46.0)
Hemoglobin: 12.7 g/dL (ref 12.0–15.0)
MCH: 28.4 pg (ref 26.0–34.0)
MCHC: 32.8 g/dL (ref 30.0–36.0)
MCV: 86.6 fL (ref 80.0–100.0)
Platelets: 309 10*3/uL (ref 150–400)
RBC: 4.47 MIL/uL (ref 3.87–5.11)
RDW: 13.5 % (ref 11.5–15.5)
WBC: 11.8 10*3/uL — ABNORMAL HIGH (ref 4.0–10.5)
nRBC: 0 % (ref 0.0–0.2)

## 2019-09-25 LAB — LACTIC ACID, PLASMA
Lactic Acid, Venous: 0.7 mmol/L (ref 0.5–1.9)
Lactic Acid, Venous: 1.1 mmol/L (ref 0.5–1.9)

## 2019-09-25 LAB — PROCALCITONIN: Procalcitonin: <0.1 ng/mL

## 2019-09-25 LAB — MAGNESIUM: Magnesium: 1.8 mg/dL (ref 1.7–2.4)

## 2019-09-25 LAB — HIV ANTIBODY (ROUTINE TESTING W REFLEX): HIV Screen 4th Generation wRfx: NONREACTIVE

## 2019-09-25 LAB — PHOSPHORUS: Phosphorus: 2.4 mg/dL — ABNORMAL LOW (ref 2.5–4.6)

## 2019-09-25 MED ORDER — ENOXAPARIN SODIUM 40 MG/0.4ML ~~LOC~~ SOLN
40.0000 mg | SUBCUTANEOUS | Status: DC
  Administered 2019-09-25 – 2019-09-30 (×6): 40 mg via SUBCUTANEOUS
  Filled 2019-09-25 (×6): qty 0.4

## 2019-09-25 MED ORDER — HYDROCHLOROTHIAZIDE 25 MG PO TABS
25.0000 mg | ORAL_TABLET | Freq: Every day | ORAL | Status: DC
  Administered 2019-09-25 – 2019-09-30 (×6): 25 mg via ORAL
  Filled 2019-09-25 (×7): qty 1

## 2019-09-25 MED ORDER — LOSARTAN POTASSIUM 50 MG PO TABS
100.0000 mg | ORAL_TABLET | Freq: Every day | ORAL | Status: DC
  Administered 2019-09-25 – 2019-09-30 (×6): 100 mg via ORAL
  Filled 2019-09-25 (×6): qty 2

## 2019-09-25 MED ORDER — LOSARTAN POTASSIUM-HCTZ 100-25 MG PO TABS: 1.0000 | ORAL_TABLET | Freq: Every day | ORAL | Status: DC

## 2019-09-25 MED ORDER — POTASSIUM CHLORIDE 10 MEQ/100ML IV SOLN
10.0000 meq | INTRAVENOUS | Status: AC
  Administered 2019-09-25 (×6): 10 meq via INTRAVENOUS
  Filled 2019-09-25 (×6): qty 100

## 2019-09-25 NOTE — Progress Notes (Signed)
PROGRESS NOTE    Frances Gray  [id number] DOB: [birth year] DOA: 09/24/2019 PCP: Binnie Rail, MD     Brief Narrative:  Frances Gray is a 69 y.o. BF PMHx HTN, HLD,  DM type II controlled with complication, MORBID obesity, nephrolithiasis, diverticulosis (s/p colonoscopy June 2020)  Presents with complaints of lower abdominal pain started yesterday afternoon around 2:30 PM.  Patient reports that she had a late breakfast prior to onset of symptoms.  Pain was described as sharp and constant.  It started in the middle of her abdomen and then moved from right to left.  Reported associated symptoms of nausea, but denied having any vomiting.  Her last bowel movement was yesterday morning.  Since that time she reports that she is not been able to pass flatus.  ED Course: Upon admission into the emergency department patient was seen to be febrile with blood pressures 142/76 and 154/84, and all other vital signs maintained.  Labs significant for sodium 134, potassium 3.4, chloride 97, and glucose 137.CT scan of the abdomen and pelvis with contrast significant for sigmoid diverticulitis with perforation without abscess.  General surgery was formally consulted.  Patient was given pain medication, 1 L of normal saline IV fluids, Zofran, and empiric antibiotics of Zosyn.  [facility] called to admit.    Subjective: A/O x4, negative CP, negative S OB.  Positive abdominal pain but significantly decreased.  Negative N/V   Assessment & Plan:   Principal Problem:   Perforation of sigmoid colon due to diverticulitis Active Problems:   Hypertension   Severe obesity (BMI >= 40) (HCC)   Diabetes mellitus without complication (HCC)   Hypokalemia    Acute Sigmoid Diverticulitis with contained microperforation -Per surgery currently no abscess and perforation not significant enough to warrant surgery. -Surgery did discuss the possibility that if she worsens she may need a repeat scan to rule out abscess  vs surgery and colostomy.   -N.p.o. -Zosyn x7 days -Normal saline IV fluids at 75 mL/h  Essential HTN -Hyzaar 100-25 mg daily   DM type II controlled without complication -[id number] hemoglobin A1c= 6.5 -Diet controlled.  HLD -Lipid panel pending -Crestor 5 mg twice weekly Hyperlipidemia: Home medications include Crestor 5 mg twice weekly -Restart Crestor when patient tolerating p.o.  Morbid obesity: BMI 47.89 kg/m   Hypokalemia -Potassium goal> 4 -Potassium IV 60 mEq   DVT prophylaxis:  Lovenox Code  Status: Full Family Communication:  Disposition Plan:  1.  Where the patient is from 2.  Anticipated d/c place. 3.  Barriers to d/c improvement of current condition vs surgery   Consultants:  [facility] Dr. Vikki Ports, Kae Heller   Procedures/Significant Events:     I have personally reviewed and interpreted all radiology studies and my findings are as above.  VENTILATOR SETTINGS:    Cultures   Antimicrobials: Anti-infectives (From admission, onward)   Start     Dose/Rate Stop   09/24/19 2200  piperacillin-tazobactam (ZOSYN) IVPB 3.375 g  Status:  Discontinued     3.375 g 100 mL/hr over 30 Minutes 09/24/19 1557   09/24/19 2200  piperacillin-tazobactam (ZOSYN) IVPB 3.375 g     3.375 g 12.5 mL/hr over 240 Minutes     09/24/19 1315  piperacillin-tazobactam (ZOSYN) IVPB 3.375 g     3.375 g 100 mL/hr over 30 Minutes 09/24/19 1501       Devices    LINES / TUBES:      Continuous Infusions: . sodium chloride 75 mL/hr at 09/25/19 0417  .  piperacillin-tazobactam (ZOSYN)  IV 3.375 g (09/25/19 1310)     Objective: Vitals:   09/24/19 1839 09/24/19 2128 09/25/19 0135 09/25/19 0508  BP: (!) 152/75 (!) 141/66 (!) 147/70 134/65  Pulse: 66 71 69 66  Resp: 20  18 16   Temp: 98 F (36.7 C) 99.4 F (37.4 C) 98.6 F (37 C) 98.7 F (37.1 C)  TempSrc: Oral Oral Oral Oral  SpO2: 99% 97% 95% 100%  Weight:      Height:        Intake/Output Summary (Last 24 hours) at  09/25/2019 1350 Last data filed at 09/25/2019 1211 Gross per 24 hour  Intake 946.84 ml  Output 1125 ml  Net -178.16 ml   Filed Weights   09/24/19 1022  Weight: 126.6 kg    Examination:  General: A/O x4, no acute respiratory distress Eyes: negative scleral hemorrhage, negative anisocoria, negative icterus ENT: Negative Runny nose, negative gingival bleeding, Neck:  Negative scars, masses, torticollis, lymphadenopathy, JVD Lungs: Clear to auscultation bilaterally without wheezes or crackles Cardiovascular: Regular rate and rhythm without murmur gallop or rub normal S1 and S2 Abdomen: MORBIDLY OBESE, mild positive abdominal pain palpation, nondistended, positive soft, bowel sounds, no rebound, no ascites, no appreciable mass Extremities: No significant cyanosis, clubbing, or edema bilateral lower extremities Skin: Negative rashes, lesions, ulcers Psychiatric:  Negative depression, negative anxiety, negative fatigue, negative mania  Central nervous system:  Cranial nerves II through XII intact, tongue/uvula midline, all extremities muscle strength 5/5, sensation intact throughout,  negative dysarthria, negative expressive aphasia, negative receptive aphasia.  .     Data Reviewed: Care during the described time interval was provided by me .  I have reviewed this patient's available data, including medical history, events of note, physical examination, and all test results as part of my evaluation.  CBC: Recent Labs  Lab 09/24/19 1031 09/25/19 0617  WBC 9.4 11.8*  NEUTROABS 7.6  --   HGB 13.5 12.7  HCT 42.3 38.7  MCV 86.3 86.6  PLT 322 [id number]   Basic Metabolic Panel: Recent Labs  Lab 09/24/19 1031 09/25/19 0617  NA 134* 138  K 3.4* 3.3*  CL 97* 103  CO2 26 28  GLUCOSE 139* 133*  BUN 7* 6*  CREATININE 0.71 0.70  CALCIUM 9.2 8.7*   GFR: Estimated Creatinine Clearance: 87.5 mL/min (by C-G formula based on SCr of 0.7 mg/dL). Liver Function Tests: Recent Labs  Lab  09/24/19 1031  AST 20  ALT 22  ALKPHOS 59  BILITOT 0.7  PROT 7.9  ALBUMIN 3.6   Recent Labs  Lab 09/24/19 1031  LIPASE 19   No results for input(s): AMMONIA in the last 168 hours. Coagulation Profile: No results for input(s): INR, PROTIME in the last 168 hours. Cardiac Enzymes: No results for input(s): CKTOTAL, CKMB, CKMBINDEX, TROPONINI in the last 168 hours. BNP (last 3 results) No results for input(s): PROBNP in the last 8760 hours. HbA1C: No results for input(s): HGBA1C in the last 72 hours. CBG: No results for input(s): GLUCAP in the last 168 hours. Lipid Profile: No results for input(s): CHOL, HDL, LDLCALC, TRIG, CHOLHDL, LDLDIRECT in the last 72 hours. Thyroid Function Tests: No results for input(s): TSH, T4TOTAL, FREET4, T3FREE, THYROIDAB in the last 72 hours. Anemia Panel: No results for input(s): VITAMINB12, FOLATE, FERRITIN, TIBC, IRON, RETICCTPCT in the last 72 hours. Sepsis Labs: No results for input(s): PROCALCITON, LATICACIDVEN in the last 168 hours.  Recent Results (from the past 240 hour(s))  Blood culture (routine x  2)     Status: None (Preliminary result)   Collection Time: 09/24/19  1:20 PM   Specimen: BLOOD  Result Value Ref Range Status   Specimen Description BLOOD LEFT ANTECUBITAL  Final   Special Requests   Final    BOTTLES DRAWN AEROBIC AND ANAEROBIC Blood Culture adequate volume   Culture   Final    NO GROWTH < 24 HOURS Performed at [facility], [place] [address], [place], [place] [address]    Report Status PENDING  Incomplete  SARS CORONAVIRUS 2 (TAT 6-24 HRS) Nasopharyngeal Nasopharyngeal Swab     Status: None   Collection Time: 09/24/19  1:51 PM   Specimen: Nasopharyngeal Swab  Result Value Ref Range Status   SARS Coronavirus 2 NEGATIVE NEGATIVE Final    Comment: (NOTE) SARS-CoV-2 target nucleic acids are NOT DETECTED. The SARS-CoV-2 RNA is generally detectable in upper and lower respiratory specimens during the acute phase of  infection. Negative results do not preclude SARS-CoV-2 infection, do not rule out co-infections with other pathogens, and should not be used as the sole basis for treatment or other patient management decisions. Negative results must be combined with clinical observations, patient history, and epidemiological information. The expected result is Negative. Fact Sheet for Patients: SugarRoll.be Fact Sheet for Healthcare Providers: https://www.Alyannah Sanks-mathews.com/ This test is not yet approved or cleared by the [place] FDA and  has been authorized for detection and/or diagnosis of SARS-CoV-2 by FDA under an Emergency Use Authorization (EUA). This EUA will remain  in effect (meaning this test can be used) for the duration of the COVID-19 declaration under Section 56 4(b)(1) of the Act, 21 U.S.C. section 360bbb-3(b)(1), unless the authorization is terminated or revoked sooner. Performed at [facility], [place] [address] [place], [place] [address]   Blood culture (routine x 2)     Status: None (Preliminary result)   Collection Time: 09/24/19  1:57 PM   Specimen: BLOOD  Result Value Ref Range Status   Specimen Description BLOOD RIGHT ANTECUBITAL  Final   Special Requests   Final    BOTTLES DRAWN AEROBIC AND ANAEROBIC Blood Culture results may not be optimal due to an inadequate volume of blood received in culture bottles   Culture   Final    NO GROWTH < 24 HOURS Performed at [facility], [place] [address], [place], [place] [address]    Report Status PENDING  Incomplete         Radiology Studies: CT ABDOMEN PELVIS W CONTRAST  Result Date: 09/24/2019 CLINICAL DATA:  Left abdominal pain and nausea. History of kidney stones and diverticulitis. EXAM: CT ABDOMEN AND PELVIS WITH CONTRAST TECHNIQUE: Multidetector CT imaging of the abdomen and pelvis was performed using the standard protocol following bolus administration of intravenous  contrast. CONTRAST:  160mL OMNIPAQUE IOHEXOL 300 MG/ML  SOLN COMPARISON:  Abdomen radiograph dated 09/18/2011. FINDINGS: Lower chest: Minimal bibasilar atelectasis/scarring. Hepatobiliary: Diffuse low density of the liver relative to the spleen. Normal appearing gallbladder. Pancreas: Unremarkable. No pancreatic ductal dilatation or surrounding inflammatory changes. Spleen: Normal in size without focal abnormality. Adrenals/Urinary Tract: Adrenal glands are unremarkable. Kidneys are normal, without renal calculi, focal lesion, or hydronephrosis. Bladder is unremarkable. Stomach/Bowel: Small hiatal hernia. Normal appearing small bowel and appendix. Multiple sigmoid colon diverticula. One diverticulum is larger with multiple small locules of adjacent and nearby extra limb air and mild soft tissue stranding. No fluid collection seen. Vascular/Lymphatic: No significant vascular findings are present. No enlarged abdominal or  pelvic lymph nodes. Reproductive: Uterus and bilateral adnexa are unremarkable. Other: No abdominal wall hernia or abnormality. No abdominopelvic ascites. Musculoskeletal: Lumbar and lower thoracic spine degenerative changes. IMPRESSION: 1. Acute, perforated sigmoid diverticulitis without abscess. A perforated sigmoid colon neoplasm is less likely but not excluded. 2. Small hiatal hernia. 3. Diffuse hepatic steatosis. Electronically Signed   By: Claudie Revering M.D.   On: 09/24/2019 13:05        Scheduled Meds: . enoxaparin (LOVENOX) injection  40 mg Subcutaneous Q24H  . sodium chloride flush  3 mL Intravenous Q12H   Continuous Infusions: . sodium chloride 75 mL/hr at 09/25/19 0417  . piperacillin-tazobactam (ZOSYN)  IV 3.375 g (09/25/19 1310)     LOS: 1 day    Time spent:40 min   Bellany Elbaum, Geraldo Docker, MD [facility] Pager [phone]  If 7PM-7AM, please contact night-coverage [email or web address] Password [email or web address] 09/25/2019, 1:50 PM

## 2019-09-25 NOTE — Progress Notes (Signed)
Subjective/Chief Complaint: Pain about the same, some bloating and nausea   Objective: Vital signs in last 24 hours: Temp:  [98 F (36.7 C)-99.4 F (37.4 C)] 98.7 F (37.1 C) (04/17 0508) Pulse Rate:  [62-71] 66 (04/17 0508) Resp:  [15-27] 16 (04/17 0508) BP: (124-168)/(64-88) 134/65 (04/17 0508) SpO2:  [95 %-100 %] 100 % (04/17 0508) Weight:  [126.6 kg] 126.6 kg (04/16 1022) Last BM Date: 09/23/19  Intake/Output from previous day: 04/16 0701 - 04/17 0700 In: 946.8 [P.O.:60; I.V.:659.9; IV Piggyback:226.9] Out: 775 [Urine:775] Intake/Output this shift: No intake/output data recorded.  General appearance: alert and cooperative Resp: unlabored Cardio: regular rate and rhythm GI: obese, mildly distended, diffusely mildly tender more pronounceed in lower abdomein  Lab Results:  Recent Labs    09/24/19 1031 09/25/19 0617  WBC 9.4 11.8*  HGB 13.5 12.7  HCT 42.3 38.7  PLT 322 309   BMET Recent Labs    09/24/19 1031 09/25/19 0617  NA 134* 138  K 3.4* 3.3*  CL 97* 103  CO2 26 28  GLUCOSE 139* 133*  BUN 7* 6*  CREATININE 0.71 0.70  CALCIUM 9.2 8.7*   PT/INR No results for input(s): LABPROT, INR in the last 72 hours. ABG No results for input(s): PHART, HCO3 in the last 72 hours.  Invalid input(s): PCO2, PO2  Studies/Results: CT ABDOMEN PELVIS W CONTRAST  Result Date: 09/24/2019 CLINICAL DATA:  Left abdominal pain and nausea. History of kidney stones and diverticulitis. EXAM: CT ABDOMEN AND PELVIS WITH CONTRAST TECHNIQUE: Multidetector CT imaging of the abdomen and pelvis was performed using the standard protocol following bolus administration of intravenous contrast. CONTRAST:  174mL OMNIPAQUE IOHEXOL 300 MG/ML  SOLN COMPARISON:  Abdomen radiograph dated 09/18/2011. FINDINGS: Lower chest: Minimal bibasilar atelectasis/scarring. Hepatobiliary: Diffuse low density of the liver relative to the spleen. Normal appearing gallbladder. Pancreas: Unremarkable. No  pancreatic ductal dilatation or surrounding inflammatory changes. Spleen: Normal in size without focal abnormality. Adrenals/Urinary Tract: Adrenal glands are unremarkable. Kidneys are normal, without renal calculi, focal lesion, or hydronephrosis. Bladder is unremarkable. Stomach/Bowel: Small hiatal hernia. Normal appearing small bowel and appendix. Multiple sigmoid colon diverticula. One diverticulum is larger with multiple small locules of adjacent and nearby extra limb air and mild soft tissue stranding. No fluid collection seen. Vascular/Lymphatic: No significant vascular findings are present. No enlarged abdominal or pelvic lymph nodes. Reproductive: Uterus and bilateral adnexa are unremarkable. Other: No abdominal wall hernia or abnormality. No abdominopelvic ascites. Musculoskeletal: Lumbar and lower thoracic spine degenerative changes. IMPRESSION: 1. Acute, perforated sigmoid diverticulitis without abscess. A perforated sigmoid colon neoplasm is less likely but not excluded. 2. Small hiatal hernia. 3. Diffuse hepatic steatosis. Electronically Signed   By: Claudie Revering M.D.   On: 09/24/2019 13:05    Anti-infectives: Anti-infectives (From admission, onward)   Start     Dose/Rate Route Frequency Ordered Stop   09/24/19 2200  piperacillin-tazobactam (ZOSYN) IVPB 3.375 g  Status:  Discontinued     3.375 g 100 mL/hr over 30 Minutes Intravenous Every 8 hours 09/24/19 1448 09/24/19 1557   09/24/19 2200  piperacillin-tazobactam (ZOSYN) IVPB 3.375 g     3.375 g 12.5 mL/hr over 240 Minutes Intravenous Every 8 hours 09/24/19 1558     09/24/19 1315  piperacillin-tazobactam (ZOSYN) IVPB 3.375 g     3.375 g 100 mL/hr over 30 Minutes Intravenous  Once 09/24/19 1310 09/24/19 1501      Assessment/Plan: HTN Obesity  Diverticulitis with contained microperforation Continue bowel rest and IV  abx. WBC up slightly this AM  FEN - NPO x ice/IVFs VTE - lovenox ID - [email or web address]  LOS: 1 day    Clovis Riley 09/25/2019

## 2019-09-26 LAB — LIPID PANEL
Cholesterol: 164 mg/dL (ref 0–200)
HDL: 64 mg/dL (ref >40)
LDL Cholesterol: 86 mg/dL (ref 0–99)
Total CHOL/HDL Ratio: 2.6 RATIO
Triglycerides: 71 mg/dL (ref <150)
VLDL: 14 mg/dL (ref 0–40)

## 2019-09-26 LAB — CBC WITH DIFFERENTIAL/PLATELET
Abs Immature Granulocytes: 0.05 10*3/uL (ref 0.00–0.07)
Basophils Absolute: 0 10*3/uL (ref 0.0–0.1)
Basophils Relative: 0 %
Eosinophils Absolute: 0.1 10*3/uL (ref 0.0–0.5)
Eosinophils Relative: 1 %
HCT: 37.9 % (ref 36.0–46.0)
Hemoglobin: 12.2 g/dL (ref 12.0–15.0)
Immature Granulocytes: 0 %
Lymphocytes Relative: 9 %
Lymphs Abs: 1.2 10*3/uL (ref 0.7–4.0)
MCH: 28.3 pg (ref 26.0–34.0)
MCHC: 32.2 g/dL (ref 30.0–36.0)
MCV: 87.9 fL (ref 80.0–100.0)
Monocytes Absolute: 1.2 10*3/uL — ABNORMAL HIGH (ref 0.1–1.0)
Monocytes Relative: 9 %
Neutro Abs: 10.3 10*3/uL — ABNORMAL HIGH (ref 1.7–7.7)
Neutrophils Relative %: 81 %
Platelets: 296 10*3/uL (ref 150–400)
RBC: 4.31 MIL/uL (ref 3.87–5.11)
RDW: 13.5 % (ref 11.5–15.5)
WBC: 12.8 10*3/uL — ABNORMAL HIGH (ref 4.0–10.5)
nRBC: 0 % (ref 0.0–0.2)

## 2019-09-26 LAB — COMPREHENSIVE METABOLIC PANEL
ALT: 17 U/L (ref 0–44)
AST: 19 U/L (ref 15–41)
Albumin: 2.7 g/dL — ABNORMAL LOW (ref 3.5–5.0)
Alkaline Phosphatase: 60 U/L (ref 38–126)
Anion gap: 11 (ref 5–15)
BUN: <5 mg/dL — ABNORMAL LOW (ref 8–23)
CO2: 23 mmol/L (ref 22–32)
Calcium: 8.6 mg/dL — ABNORMAL LOW (ref 8.9–10.3)
Chloride: 99 mmol/L (ref 98–111)
Creatinine, Ser: 0.73 mg/dL (ref 0.44–1.00)
GFR calc Af Amer: >60 mL/min (ref >60)
GFR calc non Af Amer: >60 mL/min (ref >60)
Glucose, Bld: 107 mg/dL — ABNORMAL HIGH (ref 70–99)
Potassium: 3.7 mmol/L (ref 3.5–5.1)
Sodium: 133 mmol/L — ABNORMAL LOW (ref 135–145)
Total Bilirubin: 0.9 mg/dL (ref 0.3–1.2)
Total Protein: 6.9 g/dL (ref 6.5–8.1)

## 2019-09-26 LAB — PROCALCITONIN: Procalcitonin: 0.11 ng/mL

## 2019-09-26 LAB — PHOSPHORUS: Phosphorus: 2.8 mg/dL (ref 2.5–4.6)

## 2019-09-26 LAB — MAGNESIUM: Magnesium: 1.8 mg/dL (ref 1.7–2.4)

## 2019-09-26 NOTE — Progress Notes (Signed)
Subjective/Chief Complaint: No significant changes Mildly distended No nausea   Objective: Vital signs in last 24 hours: Temp:  [98.8 F (37.1 C)-99.1 F (37.3 C)] 99.1 F (37.3 C) (04/18 0530) Pulse Rate:  [72-76] 73 (04/18 0530) Resp:  [18-20] 20 (04/18 0530) BP: (145-169)/(70-84) 145/70 (04/18 0530) SpO2:  [97 %-98 %] 98 % (04/18 0530) Last BM Date: 09/23/19  Intake/Output from previous day: 04/17 0701 - 04/18 0700 In: 2149.5 [I.V.:2053.6; IV Piggyback:95.9] Out: 2050 [Urine:2050] Intake/Output this shift: No intake/output data recorded.  WDWN in NAD Lungs - CTA B Cv - RRR Abd - obese, mildly distended, diffuse mild tenderness mostly in suprapubic and RLQ quadrants  Lab Results:  Recent Labs    09/25/19 0617 09/26/19 0336  WBC 11.8* 12.8*  HGB 12.7 12.2  HCT 38.7 37.9  PLT 309 296   BMET Recent Labs    09/25/19 0617 09/26/19 0336  NA 138 133*  K 3.3* 3.7  CL 103 99  CO2 28 23  GLUCOSE 133* 107*  BUN 6* <5*  CREATININE 0.70 0.73  CALCIUM 8.7* 8.6*   PT/INR No results for input(s): LABPROT, INR in the last 72 hours. ABG No results for input(s): PHART, HCO3 in the last 72 hours.  Invalid input(s): PCO2, PO2  Studies/Results: CT ABDOMEN PELVIS W CONTRAST  Result Date: 09/24/2019 CLINICAL DATA:  Left abdominal pain and nausea. History of kidney stones and diverticulitis. EXAM: CT ABDOMEN AND PELVIS WITH CONTRAST TECHNIQUE: Multidetector CT imaging of the abdomen and pelvis was performed using the standard protocol following bolus administration of intravenous contrast. CONTRAST:  154mL OMNIPAQUE IOHEXOL 300 MG/ML  SOLN COMPARISON:  Abdomen radiograph dated 09/18/2011. FINDINGS: Lower chest: Minimal bibasilar atelectasis/scarring. Hepatobiliary: Diffuse low density of the liver relative to the spleen. Normal appearing gallbladder. Pancreas: Unremarkable. No pancreatic ductal dilatation or surrounding inflammatory changes. Spleen: Normal in size  without focal abnormality. Adrenals/Urinary Tract: Adrenal glands are unremarkable. Kidneys are normal, without renal calculi, focal lesion, or hydronephrosis. Bladder is unremarkable. Stomach/Bowel: Small hiatal hernia. Normal appearing small bowel and appendix. Multiple sigmoid colon diverticula. One diverticulum is larger with multiple small locules of adjacent and nearby extra limb air and mild soft tissue stranding. No fluid collection seen. Vascular/Lymphatic: No significant vascular findings are present. No enlarged abdominal or pelvic lymph nodes. Reproductive: Uterus and bilateral adnexa are unremarkable. Other: No abdominal wall hernia or abnormality. No abdominopelvic ascites. Musculoskeletal: Lumbar and lower thoracic spine degenerative changes. IMPRESSION: 1. Acute, perforated sigmoid diverticulitis without abscess. A perforated sigmoid colon neoplasm is less likely but not excluded. 2. Small hiatal hernia. 3. Diffuse hepatic steatosis. Electronically Signed   By: Claudie Revering M.D.   On: 09/24/2019 13:05    Anti-infectives: Anti-infectives (From admission, onward)   Start     Dose/Rate Route Frequency Ordered Stop   09/24/19 2200  piperacillin-tazobactam (ZOSYN) IVPB 3.375 g  Status:  Discontinued     3.375 g 100 mL/hr over 30 Minutes Intravenous Every 8 hours 09/24/19 1448 09/24/19 1557   09/24/19 2200  piperacillin-tazobactam (ZOSYN) IVPB 3.375 g     3.375 g 12.5 mL/hr over 240 Minutes Intravenous Every 8 hours 09/24/19 1558     09/24/19 1315  piperacillin-tazobactam (ZOSYN) IVPB 3.375 g     3.375 g 100 mL/hr over 30 Minutes Intravenous  Once 09/24/19 1310 09/24/19 1501      Assessment/Plan: HTN Obesity  Diverticulitis with contained microperforation Continue bowel rest and IV abx. WBC up slightly this AM  FEN -NPO x  ice/IVFs VTE -lovenox ID -[email or web address]  LOS: 2 days    Frances Gray 09/26/2019

## 2019-09-26 NOTE — Progress Notes (Signed)
PROGRESS NOTE    Frances Gray  [id number] DOB: [birth year] DOA: 09/24/2019 PCP: Binnie Rail, MD     Brief Narrative:  Frances Gray is a 69 y.o. BF PMHx HTN, HLD,  DM type II controlled with complication, MORBID obesity, nephrolithiasis, diverticulosis (s/p colonoscopy June 2020)  Presents with complaints of lower abdominal pain started yesterday afternoon around 2:30 PM.  Patient reports that she had a late breakfast prior to onset of symptoms.  Pain was described as sharp and constant.  It started in the middle of her abdomen and then moved from right to left.  Reported associated symptoms of nausea, but denied having any vomiting.  Her last bowel movement was yesterday morning.  Since that time she reports that she is not been able to pass flatus.  ED Course: Upon admission into the emergency department patient was seen to be febrile with blood pressures 142/76 and 154/84, and all other vital signs maintained.  Labs significant for sodium 134, potassium 3.4, chloride 97, and glucose 137.CT scan of the abdomen and pelvis with contrast significant for sigmoid diverticulitis with perforation without abscess.  General surgery was formally consulted.  Patient was given pain medication, 1 L of normal saline IV fluids, Zofran, and empiric antibiotics of Zosyn.  TR[facility] called to admit.    Subjective: 4/18 afebrile overnight, slight increase WBC  Assessment & Plan:   Principal Problem:   Perforation of sigmoid colon due to diverticulitis Active Problems:   Hypertension   Severe obesity (BMI >= 40) (HCC)   Diabetes mellitus without complication (HCC)   Hypokalemia    Acute Sigmoid Diverticulitis with contained microperforation -Per surgery currently no abscess and perforation not significant enough to warrant surgery. -Surgery did discuss the possibility that if she worsens she may need a repeat scan to rule out abscess vs surgery and colostomy.   -N.p.o. -Zosyn x7 days -Normal  saline IV fluids at 75 mL/h  Essential HTN -Hyzaar 100-25 mg daily   DM type II controlled without complication -[id number] hemoglobin A1c= 6.5 -Diet controlled.  HLD -Lipid panel pending -Crestor 5 mg twice weekly Hyperlipidemia: Home medications include Crestor 5 mg twice weekly -Restart Crestor when patient tolerating p.o.  Morbid obesity: BMI 47.89 kg/m   Hypokalemia -Potassium goal> 4 -Potassium IV 60 mEq   DVT prophylaxis:  Lovenox Code  Status: Full Family Communication:  Disposition Plan:  1.  Where the patient is from 2.  Anticipated d/c place. 3.  Barriers to d/c improvement of current condition vs surgery   Consultants:  [facility] Dr. Vikki Ports, Kae Heller   Procedures/Significant Events:     I have personally reviewed and interpreted all radiology studies and my findings are as above.  VENTILATOR SETTINGS:    Cultures   Antimicrobials: Anti-infectives (From admission, onward)   Start     Dose/Rate Stop   09/24/19 2200  piperacillin-tazobactam (ZOSYN) IVPB 3.375 g  Status:  Discontinued     3.375 g 100 mL/hr over 30 Minutes 09/24/19 1557   09/24/19 2200  piperacillin-tazobactam (ZOSYN) IVPB 3.375 g     3.375 g 12.5 mL/hr over 240 Minutes     09/24/19 1315  piperacillin-tazobactam (ZOSYN) IVPB 3.375 g     3.375 g 100 mL/hr over 30 Minutes 09/24/19 1501       Devices    LINES / TUBES:      Continuous Infusions: . sodium chloride 75 mL/hr at 09/25/19 1500  . piperacillin-tazobactam (ZOSYN)  IV 3.375 g (09/26/19 1355)  Objective: Vitals:   09/25/19 1433 09/25/19 1951 09/26/19 0530 09/26/19 1530  BP: (!) 152/77 (!) 169/84 (!) 145/70 (!) 144/74  Pulse: 72 76 73 74  Resp: 18  20 18   Temp: 99 F (37.2 C) 98.8 F (37.1 C) 99.1 F (37.3 C) 99.2 F (37.3 C)  TempSrc: Oral Oral Oral Oral  SpO2: 97% 97% 98% 98%  Weight:      Height:        Intake/Output Summary (Last 24 hours) at 09/26/2019 1535 Last data filed at 09/26/2019 1156 Gross  per 24 hour  Intake 1108.89 ml  Output 2300 ml  Net -1191.11 ml   Filed Weights   09/24/19 1022  Weight: 126.6 kg   Physical Exam:  General: A/O x4, no acute respiratory distress Eyes: negative scleral hemorrhage, negative anisocoria, negative icterus ENT: Negative Runny nose, negative gingival bleeding, Neck:  Negative scars, masses, torticollis, lymphadenopathy, JVD Lungs: Clear to auscultation bilaterally without wheezes or crackles Cardiovascular: Regular rate and rhythm without murmur gallop or rub normal S1 and S2 Abdomen: MORBIDLY OBESE, mild positive  abdominal pain, nondistended, positive soft, bowel sounds, no rebound, no ascites, no appreciable mass Extremities: No significant cyanosis, clubbing, or edema bilateral lower extremities Skin: Negative rashes, lesions, ulcers Psychiatric:  Negative depression, negative anxiety, negative fatigue, negative mania  Central nervous system:  Cranial nerves II through XII intact, tongue/uvula midline, all extremities muscle strength 5/5, sensation intact throughout, negative dysarthria, negative expressive aphasia, negative receptive aphasia.  .     Data Reviewed: Care during the described time interval was provided by me .  I have reviewed this patient's available data, including medical history, events of note, physical examination, and all test results as part of my evaluation.  CBC: Recent Labs  Lab 09/24/19 1031 09/25/19 0617 09/26/19 0336  WBC 9.4 11.8* 12.8*  NEUTROABS 7.6  --  10.3*  HGB 13.5 12.7 12.2  HCT 42.3 38.7 37.9  MCV 86.3 86.6 87.9  PLT 322 309 0000000   Basic Metabolic Panel: Recent Labs  Lab 09/24/19 1031 09/25/19 0617 09/25/19 1531 09/26/19 0336  NA 134* 138  --  133*  K 3.4* 3.3*  --  3.7  CL 97* 103  --  99  CO2 26 28  --  23  GLUCOSE 139* 133*  --  107*  BUN 7* 6*  --  <5*  CREATININE 0.71 0.70  --  0.73  CALCIUM 9.2 8.7*  --  8.6*  MG  --   --  1.8 1.8  PHOS  --   --  2.4* 2.8    GFR: Estimated Creatinine Clearance: 87.5 mL/min (by C-G formula based on SCr of 0.73 mg/dL). Liver Function Tests: Recent Labs  Lab 09/24/19 1031 09/26/19 0336  AST 20 19  ALT 22 17  ALKPHOS 59 60  BILITOT 0.7 0.9  PROT 7.9 6.9  ALBUMIN 3.6 2.7*   Recent Labs  Lab 09/24/19 1031  LIPASE 19   No results for input(s): AMMONIA in the last 168 hours. Coagulation Profile: No results for input(s): INR, PROTIME in the last 168 hours. Cardiac Enzymes: No results for input(s): CKTOTAL, CKMB, CKMBINDEX, TROPONINI in the last 168 hours. BNP (last 3 results) No results for input(s): PROBNP in the last 8760 hours. HbA1C: Recent Labs    09/25/19 1531  HGBA1C 6.5*   CBG: No results for input(s): GLUCAP in the last 168 hours. Lipid Profile: Recent Labs    09/25/19 1531 09/26/19 0336  CHOL 168 164  HDL 63 64  LDLCALC 92 86  TRIG 65 71  CHOLHDL 2.7 2.6   Thyroid Function Tests: No results for input(s): TSH, T4TOTAL, FREET4, T3FREE, THYROIDAB in the last 72 hours. Anemia Panel: No results for input(s): VITAMINB12, FOLATE, FERRITIN, TIBC, IRON, RETICCTPCT in the last 72 hours. Sepsis Labs: Recent Labs  Lab 09/25/19 1531 09/25/19 1831 09/26/19 0336  PROCALCITON <0.10  --  0.11  LATICACIDVEN 0.7 1.1  --     Recent Results (from the past 240 hour(s))  Blood culture (routine x 2)     Status: None (Preliminary result)   Collection Time: 09/24/19  1:20 PM   Specimen: BLOOD  Result Value Ref Range Status   Specimen Description BLOOD LEFT ANTECUBITAL  Final   Special Requests   Final    BOTTLES DRAWN AEROBIC AND ANAEROBIC Blood Culture adequate volume   Culture   Final    NO GROWTH 2 DAYS Performed at [facility], [address], [place], [place] [address]    Report Status PENDING  Incomplete  SARS CORONAVIRUS 2 (TAT 6-24 HRS) Nasopharyngeal Nasopharyngeal Swab     Status: None   Collection Time: 09/24/19  1:51 PM   Specimen: Nasopharyngeal Swab  Result Value  Ref Range Status   SARS Coronavirus 2 NEGATIVE NEGATIVE Final    Comment: (NOTE) SARS-CoV-2 target nucleic acids are NOT DETECTED. The SARS-CoV-2 RNA is generally detectable in upper and lower respiratory specimens during the acute phase of infection. Negative results do not preclude SARS-CoV-2 infection, do not rule out co-infections with other pathogens, and should not be used as the sole basis for treatment or other patient management decisions. Negative results must be combined with clinical observations, patient history, and epidemiological information. The expected result is Negative. Fact Sheet for Patients: SugarRoll.be Fact Sheet for Healthcare Providers: https://www.Michaeljames Milnes-mathews.com/ This test is not yet approved or cleared by the [place] FDA and  has been authorized for detection and/or diagnosis of SARS-CoV-2 by FDA under an Emergency Use Authorization (EUA). This EUA will remain  in effect (meaning this test can be used) for the duration of the COVID-19 declaration under Section 56 4(b)(1) of the Act, 21 U.S.C. section 360bbb-3(b)(1), unless the authorization is terminated or revoked sooner. Performed at [facility], [place] [address] [place], [place] [address]   Blood culture (routine x 2)     Status: None (Preliminary result)   Collection Time: 09/24/19  1:57 PM   Specimen: BLOOD  Result Value Ref Range Status   Specimen Description BLOOD RIGHT ANTECUBITAL  Final   Special Requests   Final    BOTTLES DRAWN AEROBIC AND ANAEROBIC Blood Culture results may not be optimal due to an inadequate volume of blood received in culture bottles   Culture   Final    NO GROWTH 2 DAYS Performed at [facility], [place] [address], [place], [place] [address]    Report Status PENDING  Incomplete         Radiology Studies: No results found.      Scheduled Meds: . enoxaparin (LOVENOX) injection  40 mg Subcutaneous  Q24H  . losartan  100 mg Oral Daily   And  . hydrochlorothiazide  25 mg Oral Daily  . sodium chloride flush  3 mL Intravenous Q12H   Continuous Infusions: . sodium chloride 75 mL/hr at 09/25/19 1500  . piperacillin-tazobactam (ZOSYN)  IV 3.375 g (09/26/19 1355)     LOS: 2 days    Time  spent:40 min   Dinesh Ulysse, Geraldo Docker, MD [facility] Pager [phone]  If 7PM-7AM, please contact night-coverage [email or web address] Password [facility] 09/26/2019, 3:35 PM

## 2019-09-26 NOTE — Plan of Care (Signed)

## 2019-09-27 LAB — COMPREHENSIVE METABOLIC PANEL
ALT: 15 U/L (ref 0–44)
AST: 19 U/L (ref 15–41)
Albumin: 2.5 g/dL — ABNORMAL LOW (ref 3.5–5.0)
Alkaline Phosphatase: 64 U/L (ref 38–126)
Anion gap: 14 (ref 5–15)
BUN: 6 mg/dL — ABNORMAL LOW (ref 8–23)
CO2: 23 mmol/L (ref 22–32)
Calcium: 8.7 mg/dL — ABNORMAL LOW (ref 8.9–10.3)
Chloride: 96 mmol/L — ABNORMAL LOW (ref 98–111)
Creatinine, Ser: 0.77 mg/dL (ref 0.44–1.00)
GFR calc Af Amer: >60 mL/min (ref >60)
GFR calc non Af Amer: >60 mL/min (ref >60)
Glucose, Bld: 105 mg/dL — ABNORMAL HIGH (ref 70–99)
Potassium: 2.9 mmol/L — ABNORMAL LOW (ref 3.5–5.1)
Sodium: 133 mmol/L — ABNORMAL LOW (ref 135–145)
Total Bilirubin: 1 mg/dL (ref 0.3–1.2)
Total Protein: 6.9 g/dL (ref 6.5–8.1)

## 2019-09-27 LAB — CBC WITH DIFFERENTIAL/PLATELET
Abs Immature Granulocytes: 0.05 K/uL (ref 0.00–0.07)
Basophils Absolute: 0 K/uL (ref 0.0–0.1)
Basophils Relative: 0 %
Eosinophils Absolute: 0.2 K/uL (ref 0.0–0.5)
Eosinophils Relative: 2 %
HCT: 36.6 % (ref 36.0–46.0)
Hemoglobin: 12.1 g/dL (ref 12.0–15.0)
Immature Granulocytes: 1 %
Lymphocytes Relative: 10 %
Lymphs Abs: 1.1 K/uL (ref 0.7–4.0)
MCH: 28.3 pg (ref 26.0–34.0)
MCHC: 33.1 g/dL (ref 30.0–36.0)
MCV: 85.5 fL (ref 80.0–100.0)
Monocytes Absolute: 1.2 K/uL — ABNORMAL HIGH (ref 0.1–1.0)
Monocytes Relative: 11 %
Neutro Abs: 8.3 K/uL — ABNORMAL HIGH (ref 1.7–7.7)
Neutrophils Relative %: 76 %
Platelets: 347 K/uL (ref 150–400)
RBC: 4.28 MIL/uL (ref 3.87–5.11)
RDW: 13.3 % (ref 11.5–15.5)
WBC: 10.7 K/uL — ABNORMAL HIGH (ref 4.0–10.5)
nRBC: 0 % (ref 0.0–0.2)

## 2019-09-27 LAB — PHOSPHORUS: Phosphorus: 3.2 mg/dL (ref 2.5–4.6)

## 2019-09-27 LAB — PROCALCITONIN: Procalcitonin: <0.1 ng/mL

## 2019-09-27 LAB — MAGNESIUM: Magnesium: 1.9 mg/dL (ref 1.7–2.4)

## 2019-09-27 MED ORDER — MAGNESIUM SULFATE 2 GM/50ML IV SOLN
2.0000 g | Freq: Once | INTRAVENOUS | Status: AC
  Administered 2019-09-27: 2 g via INTRAVENOUS
  Filled 2019-09-27 (×2): qty 50

## 2019-09-27 MED ORDER — POLYETHYLENE GLYCOL 3350 17 G PO PACK
17.0000 g | PACK | Freq: Every day | ORAL | Status: DC
  Administered 2019-09-27 – 2019-09-29 (×3): 17 g via ORAL
  Filled 2019-09-27 (×4): qty 1

## 2019-09-27 MED ORDER — POTASSIUM CHLORIDE 10 MEQ/100ML IV SOLN
10.0000 meq | INTRAVENOUS | Status: AC
  Administered 2019-09-27 (×7): 10 meq via INTRAVENOUS
  Filled 2019-09-27 (×7): qty 100

## 2019-09-27 NOTE — Progress Notes (Signed)
PROGRESS NOTE    Frances Gray  [id number] DOB: 12/30/1950 DOA: 09/24/2019 PCP: Binnie Rail, MD     Brief Narrative:  Frances Gray is a 69 y.o. BF PMHx HTN, HLD,  DM type II controlled with complication, MORBID obesity, nephrolithiasis, diverticulosis (s/p colonoscopy June 2020)  Presents with complaints of lower abdominal pain started yesterday afternoon around 2:30 PM.  Patient reports that she had a late breakfast prior to onset of symptoms.  Pain was described as sharp and constant.  It started in the middle of her abdomen and then moved from right to left.  Reported associated symptoms of nausea, but denied having any vomiting.  Her last bowel movement was yesterday morning.  Since that time she reports that she is not been able to pass flatus.  ED Course: Upon admission into the emergency department patient was seen to be febrile with blood pressures 142/76 and 154/84, and all other vital signs maintained.  Labs significant for sodium 134, potassium 3.4, chloride 97, and glucose 137.CT scan of the abdomen and pelvis with contrast significant for sigmoid diverticulitis with perforation without abscess.  General surgery was formally consulted.  Patient was given pain medication, 1 L of normal saline IV fluids, Zofran, and empiric antibiotics of Zosyn.  TRH called to admit.    Subjective: 4/19 last 24 hours afebrile A/O x4, negative CP, negative S OB, negative abdominal pain.  States had hard BM today requesting stool softener.    Assessment & Plan:   Principal Problem:   Perforation of sigmoid colon due to diverticulitis Active Problems:   Hypertension   Severe obesity (BMI >= 40) (HCC)   Diabetes mellitus without complication (HCC)   Hypokalemia    Acute Sigmoid Diverticulitis with contained microperforation -Per surgery currently no abscess and perforation not significant enough to warrant surgery. -Surgery did discuss the possibility that if she worsens she may  need a repeat scan to rule out abscess vs surgery and colostomy.   -N.p.o. -Zosyn x7 days -Normal saline IV fluids at 75 mL/h -4/19 appears to be resolving.  Surgery comfortable with starting patient on clear liquid diet.  Leukocytosis Recent Labs  Lab 09/24/19 1031 09/25/19 0617 09/26/19 0336 09/27/19 0358  WBC 9.4 11.8* 12.8* 10.7*  -We will continue to monitor  Essential HTN -Hyzaar 100-25 mg daily   DM type II controlled without complication -[id number] hemoglobin A1c= 6.5 -Diet controlled.  -4/19 patient requests nutrition consult to discuss appropriate diet for patient with HTN, diabetes, HLD.  HLD -4/18 LDL = 86  -4/18 increase Crestor 5 mg daily; start when patient tolerates diet food  Morbid obesity: BMI 47.89 kg/m -See diabetes  Hypokalemia -Potassium goal> 4 -Potassium IV 70 mEq   Hypomagnesmia -Magnesium goal> 2 -Magnesium IV 2 g    DVT prophylaxis:  Lovenox Code  Status: Full Family Communication:  Disposition Plan:  1.  Where the patient is from 2.  Anticipated d/c place. 3.  Barriers to d/c improvement of current condition vs surgery   Consultants:  [facility] Dr. Vikki Ports, Kae Heller   Procedures/Significant Events:     I have personally reviewed and interpreted all radiology studies and my findings are as above.  VENTILATOR SETTINGS:    Cultures   Antimicrobials: Anti-infectives (From admission, onward)   Start     Dose/Rate Stop   09/24/19 2200  piperacillin-tazobactam (ZOSYN) IVPB 3.375 g  Status:  Discontinued     3.375 g 100 mL/hr over 30 Minutes 09/24/19 1557   09/24/19 2200  piperacillin-tazobactam (ZOSYN) IVPB 3.375 g     3.375 g 12.5 mL/hr over 240 Minutes     09/24/19 1315  piperacillin-tazobactam (ZOSYN) IVPB 3.375 g     3.375 g 100 mL/hr over 30 Minutes 09/24/19 1501       Devices    LINES / TUBES:      Continuous Infusions: . sodium chloride 75 mL/hr at 09/25/19 1500  . piperacillin-tazobactam (ZOSYN)  IV 3.375  g (09/27/19 0457)     Objective: Vitals:   09/26/19 0530 09/26/19 1530 09/26/19 2205 09/27/19 0409  BP: (!) 145/70 (!) 144/74 (!) 148/77 133/70  Pulse: 73 74 76 72  Resp: 20 18 17 17   Temp: 99.1 F (37.3 C) 99.2 F (37.3 C) 98 F (36.7 C) 98.3 F (36.8 C)  TempSrc: Oral Oral Oral Oral  SpO2: 98% 98% 97% 97%  Weight:      Height:        Intake/Output Summary (Last 24 hours) at 09/27/2019 0841 Last data filed at 09/27/2019 0457 Gross per 24 hour  Intake 910 ml  Output 3375 ml  Net -2465 ml   Filed Weights   09/24/19 1022  Weight: 126.6 kg   Physical Exam:  General: A/O x4, no acute respiratory distress Eyes: negative scleral hemorrhage, negative anisocoria, negative icterus ENT: Negative Runny nose, negative gingival bleeding, Neck:  Negative scars, masses, torticollis, lymphadenopathy, JVD Lungs: Clear to auscultation bilaterally without wheezes or crackles Cardiovascular: Regular rate and rhythm without murmur gallop or rub normal S1 and S2 Abdomen: MORBIDLY OBESE, negative abdominal pain, nondistended, positive soft, bowel sounds, no rebound, no ascites, no appreciable mass Extremities: No significant cyanosis, clubbing, or edema bilateral lower extremities Skin: Negative rashes, lesions, ulcers Psychiatric:  Negative depression, negative anxiety, negative fatigue, negative mania  Central nervous system:  Cranial nerves II through XII intact, tongue/uvula midline, all extremities muscle strength 5/5, sensation intact throughout, negative dysarthria, negative expressive aphasia, negative receptive aphasia.  .     Data Reviewed: Care during the described time interval was provided by me .  I have reviewed this patient's available data, including medical history, events of note, physical examination, and all test results as part of my evaluation.  CBC: Recent Labs  Lab 09/24/19 1031 09/25/19 0617 09/26/19 0336 09/27/19 0358  WBC 9.4 11.8* 12.8* 10.7*  NEUTROABS  7.6  --  10.3* 8.3*  HGB 13.5 12.7 12.2 12.1  HCT 42.3 38.7 37.9 36.6  MCV 86.3 86.6 87.9 85.5  PLT 322 309 296 [id number]   Basic Metabolic Panel: Recent Labs  Lab 09/24/19 1031 09/25/19 0617 09/25/19 1531 09/26/19 0336 09/27/19 0358  NA 134* 138  --  133* 133*  K 3.4* 3.3*  --  3.7 2.9*  CL 97* 103  --  99 96*  CO2 26 28  --  23 23  GLUCOSE 139* 133*  --  107* 105*  BUN 7* 6*  --  <5* 6*  CREATININE 0.71 0.70  --  0.73 0.77  CALCIUM 9.2 8.7*  --  8.6* 8.7*  MG  --   --  1.8 1.8 1.9  PHOS  --   --  2.4* 2.8 3.2   GFR: Estimated Creatinine Clearance: 87.5 mL/min (by C-G formula based on SCr of 0.77 mg/dL). Liver Function Tests: Recent Labs  Lab 09/24/19 1031 09/26/19 0336 09/27/19 0358  AST 20 19 19   ALT 22 17 15   ALKPHOS 59 60 64  BILITOT 0.7 0.9 1.0  PROT 7.9 6.9 6.9  ALBUMIN 3.6 2.7* 2.5*   Recent Labs  Lab 09/24/19 1031  LIPASE 19   No results for input(s): AMMONIA in the last 168 hours. Coagulation Profile: No results for input(s): INR, PROTIME in the last 168 hours. Cardiac Enzymes: No results for input(s): CKTOTAL, CKMB, CKMBINDEX, TROPONINI in the last 168 hours. BNP (last 3 results) No results for input(s): PROBNP in the last 8760 hours. HbA1C: Recent Labs    09/25/19 1531  HGBA1C 6.5*   CBG: No results for input(s): GLUCAP in the last 168 hours. Lipid Profile: Recent Labs    09/25/19 1531 09/26/19 0336  CHOL 168 164  HDL 63 64  LDLCALC 92 86  TRIG 65 71  CHOLHDL 2.7 2.6   Thyroid Function Tests: No results for input(s): TSH, T4TOTAL, FREET4, T3FREE, THYROIDAB in the last 72 hours. Anemia Panel: No results for input(s): VITAMINB12, FOLATE, FERRITIN, TIBC, IRON, RETICCTPCT in the last 72 hours. Sepsis Labs: Recent Labs  Lab 09/25/19 1531 09/25/19 1831 09/26/19 0336 09/27/19 0358  PROCALCITON <0.10  --  0.11 <0.10  LATICACIDVEN 0.7 1.1  --   --     Recent Results (from the past 240 hour(s))  Blood culture (routine x 2)     Status:  None (Preliminary result)   Collection Time: 09/24/19  1:20 PM   Specimen: BLOOD  Result Value Ref Range Status   Specimen Description BLOOD LEFT ANTECUBITAL  Final   Special Requests   Final    BOTTLES DRAWN AEROBIC AND ANAEROBIC Blood Culture adequate volume   Culture   Final    NO GROWTH 2 DAYS Performed at [facility], [address], [place], [place] [address]    Report Status PENDING  Incomplete  SARS CORONAVIRUS 2 (TAT 6-24 HRS) Nasopharyngeal Nasopharyngeal Swab     Status: None   Collection Time: 09/24/19  1:51 PM   Specimen: Nasopharyngeal Swab  Result Value Ref Range Status   SARS Coronavirus 2 NEGATIVE NEGATIVE Final    Comment: (NOTE) SARS-CoV-2 target nucleic acids are NOT DETECTED. The SARS-CoV-2 RNA is generally detectable in upper and lower respiratory specimens during the acute phase of infection. Negative results do not preclude SARS-CoV-2 infection, do not rule out co-infections with other pathogens, and should not be used as the sole basis for treatment or other patient management decisions. Negative results must be combined with clinical observations, patient history, and epidemiological information. The expected result is Negative. Fact Sheet for Patients: SugarRoll.be Fact Sheet for Healthcare Providers: https://www.Tangelia Sanson-mathews.com/ This test is not yet approved or cleared by the [place] FDA and  has been authorized for detection and/or diagnosis of SARS-CoV-2 by FDA under an Emergency Use Authorization (EUA). This EUA will remain  in effect (meaning this test can be used) for the duration of the COVID-19 declaration under Section 56 4(b)(1) of the Act, 21 U.S.C. section 360bbb-3(b)(1), unless the authorization is terminated or revoked sooner. Performed at [facility], [place] [address], [place], [place] [address]   Blood culture (routine x 2)     Status: None (Preliminary result)   Collection  Time: 09/24/19  1:57 PM   Specimen: BLOOD  Result Value Ref Range Status   Specimen Description BLOOD RIGHT ANTECUBITAL  Final   Special Requests   Final    BOTTLES DRAWN AEROBIC AND ANAEROBIC Blood Culture results may not be optimal due to an inadequate volume of blood received in culture bottles   Culture   Final    NO  GROWTH 2 DAYS Performed at [facility], [place] [address], [place], [place] [address]    Report Status PENDING  Incomplete         Radiology Studies: No results found.      Scheduled Meds: . enoxaparin (LOVENOX) injection  40 mg Subcutaneous Q24H  . losartan  100 mg Oral Daily   And  . hydrochlorothiazide  25 mg Oral Daily  . sodium chloride flush  3 mL Intravenous Q12H   Continuous Infusions: . sodium chloride 75 mL/hr at 09/25/19 1500  . piperacillin-tazobactam (ZOSYN)  IV 3.375 g (09/27/19 0457)     LOS: 3 days    Time spent:40 min   Cannon Arreola, Geraldo Docker, MD [facility] Pager [phone]  If 7PM-7AM, please contact night-coverage [email or web address] Password [facility] 09/27/2019, 8:41 AM

## 2019-09-27 NOTE — Progress Notes (Signed)
   Subjective/Chief Complaint: Less pain   Objective: Vital signs in last 24 hours: Temp:  [98 F (36.7 C)-99.2 F (37.3 C)] 98.3 F (36.8 C) (04/19 0409) Pulse Rate:  [72-76] 72 (04/19 0409) Resp:  [17-18] 17 (04/19 0409) BP: (133-148)/(70-77) 133/70 (04/19 0409) SpO2:  [97 %-98 %] 97 % (04/19 0409) Last BM Date: 09/23/19  Intake/Output from previous day: 04/18 0701 - 04/19 0700 In: 910 [P.O.:60; I.V.:750; IV Piggyback:100] Out: 3375 [Urine:3375] Intake/Output this shift: No intake/output data recorded.  General appearance: alert and cooperative Resp: clear to auscultation bilaterally Cardio: regular rate and rhythm GI: soft, minimal tenderness low midline  Lab Results:  Recent Labs    09/26/19 0336 09/27/19 0358  WBC 12.8* 10.7*  HGB 12.2 12.1  HCT 37.9 36.6  PLT 296 347   BMET Recent Labs    09/26/19 0336 09/27/19 0358  NA 133* 133*  K 3.7 2.9*  CL 99 96*  CO2 23 23  GLUCOSE 107* 105*  BUN <5* 6*  CREATININE 0.73 0.77  CALCIUM 8.6* 8.7*   PT/INR No results for input(s): LABPROT, INR in the last 72 hours. ABG No results for input(s): PHART, HCO3 in the last 72 hours.  Invalid input(s): PCO2, PO2  Studies/Results: No results found.  Anti-infectives: Anti-infectives (From admission, onward)   Start     Dose/Rate Route Frequency Ordered Stop   09/24/19 2200  piperacillin-tazobactam (ZOSYN) IVPB 3.375 g  Status:  Discontinued     3.375 g 100 mL/hr over 30 Minutes Intravenous Every 8 hours 09/24/19 1448 09/24/19 1557   09/24/19 2200  piperacillin-tazobactam (ZOSYN) IVPB 3.375 g     3.375 g 12.5 mL/hr over 240 Minutes Intravenous Every 8 hours 09/24/19 1558     09/24/19 1315  piperacillin-tazobactam (ZOSYN) IVPB 3.375 g     3.375 g 100 mL/hr over 30 Minutes Intravenous  Once 09/24/19 1310 09/24/19 1501      Assessment/Plan: HTN Obesity  Diverticulitis with contained microperforation Less pain and WBC down some, Start clears.  FEN  -clears, hypokalemia per primary service VTE -lovenox ID -[email or web address]  LOS: 3 days    Zenovia Jarred 09/27/2019

## 2019-09-28 LAB — PHOSPHORUS: Phosphorus: 2.9 mg/dL (ref 2.5–4.6)

## 2019-09-28 LAB — CBC WITH DIFFERENTIAL/PLATELET
Abs Immature Granulocytes: 0.04 10*3/uL (ref 0.00–0.07)
Basophils Absolute: 0 10*3/uL (ref 0.0–0.1)
Basophils Relative: 0 %
Eosinophils Absolute: 0.2 10*3/uL (ref 0.0–0.5)
Eosinophils Relative: 3 %
HCT: 35.9 % — ABNORMAL LOW (ref 36.0–46.0)
Hemoglobin: 11.6 g/dL — ABNORMAL LOW (ref 12.0–15.0)
Immature Granulocytes: 1 %
Lymphocytes Relative: 14 %
Lymphs Abs: 1.2 10*3/uL (ref 0.7–4.0)
MCH: 27.7 pg (ref 26.0–34.0)
MCHC: 32.3 g/dL (ref 30.0–36.0)
MCV: 85.7 fL (ref 80.0–100.0)
Monocytes Absolute: 1.1 10*3/uL — ABNORMAL HIGH (ref 0.1–1.0)
Monocytes Relative: 13 %
Neutro Abs: 5.9 10*3/uL (ref 1.7–7.7)
Neutrophils Relative %: 69 %
Platelets: 354 10*3/uL (ref 150–400)
RBC: 4.19 MIL/uL (ref 3.87–5.11)
RDW: 13.2 % (ref 11.5–15.5)
WBC: 8.4 10*3/uL (ref 4.0–10.5)
nRBC: 0 % (ref 0.0–0.2)

## 2019-09-28 LAB — COMPREHENSIVE METABOLIC PANEL
ALT: 17 U/L (ref 0–44)
AST: 21 U/L (ref 15–41)
Albumin: 2.5 g/dL — ABNORMAL LOW (ref 3.5–5.0)
Alkaline Phosphatase: 59 U/L (ref 38–126)
Anion gap: 8 (ref 5–15)
BUN: <5 mg/dL — ABNORMAL LOW (ref 8–23)
CO2: 28 mmol/L (ref 22–32)
Calcium: 8.6 mg/dL — ABNORMAL LOW (ref 8.9–10.3)
Chloride: 100 mmol/L (ref 98–111)
Creatinine, Ser: 0.8 mg/dL (ref 0.44–1.00)
GFR calc Af Amer: >60 mL/min (ref >60)
GFR calc non Af Amer: >60 mL/min (ref >60)
Glucose, Bld: 144 mg/dL — ABNORMAL HIGH (ref 70–99)
Potassium: 3.7 mmol/L (ref 3.5–5.1)
Sodium: 136 mmol/L (ref 135–145)
Total Bilirubin: 0.4 mg/dL (ref 0.3–1.2)
Total Protein: 6.9 g/dL (ref 6.5–8.1)

## 2019-09-28 LAB — MAGNESIUM: Magnesium: 2.1 mg/dL (ref 1.7–2.4)

## 2019-09-28 MED ORDER — OXYCODONE HCL 5 MG PO TABS
5.0000 mg | ORAL_TABLET | ORAL | Status: DC | PRN
  Administered 2019-09-28 – 2019-09-29 (×3): 5 mg via ORAL
  Filled 2019-09-28 (×3): qty 1

## 2019-09-28 MED ORDER — DOCUSATE SODIUM 100 MG PO CAPS
100.0000 mg | ORAL_CAPSULE | Freq: Two times a day (BID) | ORAL | Status: DC
  Administered 2019-09-28 – 2019-09-29 (×4): 100 mg via ORAL
  Filled 2019-09-28 (×5): qty 1

## 2019-09-28 MED ORDER — MORPHINE SULFATE (PF) 2 MG/ML IV SOLN: 2.0000 mg | INTRAVENOUS | Status: DC | PRN

## 2019-09-28 NOTE — Progress Notes (Addendum)
Nutrition Brief Note  RD consulted for diet education due to obesity.   Wt Readings from Last 15 Encounters:  09/24/19 126.6 kg  06/01/19 126.6 kg  11/25/18 124.3 kg  11/24/18 117.9 kg  11/10/18 117.9 kg  05/26/18 122.9 kg  05/26/18 122.9 kg  03/01/18 123.8 kg  11/19/17 122 kg  05/21/17 125.2 kg  02/27/17 126.6 kg  11/19/16 125.2 kg  05/20/16 127.5 kg  01/23/16 124.7 kg  05/15/15 118.6 kg   Frances Gray is a 69 y.o. female with medical history significant of HTN, HLD, diabetes mellitus type 2, and morbid obesity presents with complaints of lower abdominal pain started yesterday afternoon around 2:30 PM.  Patient reports that she had a late breakfast prior to onset of symptoms.  Pain was described as sharp and constant.  It started in the middle of her abdomen and then moved from right to left.  Reported associated symptoms of nausea, but denied having any vomiting.  Her last bowel movement was yesterday morning.  Since that time she reports that she is not been able to pass flatus.  Pt admitted with sigmoid diverticulitis with perforation.   Pt on bedside commode at time of visit. Case discussed with RN, who reports pt was just advanced to full liquid diet and tolerating well.   RD attached "Low Fiber Nutrition Therapy" handout to AVS/ discharge instructions.   ADDENDUM (1541): RN requested RD revisit pt. RD attempted to call pt via room phone, however, no answer.   Lab Results  Component Value Date   HGBA1C 6.5 (H) 09/25/2019   PTA DM medications are none.   Labs reviewed.  Body mass index is 47.89 kg/m. Patient meets criteria for morbid obesity based on current BMI. Obesity is a complex, chronic medical condition that is optimally managed by a multidisciplinary care team. Weight loss is not an ideal goal for an acute inpatient hospitalization. However, if further work-up for obesity is warranted, consider outpatient referral to outpatient bariatric service and/or [facility]'s Nutrition and Diabetes Education Services.   Current diet order is full liquid, patient is consuming approximately% of meals at this time. Labs and medications reviewed.   No nutrition interventions warranted at this time. If nutrition issues arise, please consult RD.   Loistine Chance, RD, LDN, [place] Registered Dietitian II Certified Diabetes Care and Education Specialist Please refer to [facility] for RD and/or RD on-call/weekend/after hours pager

## 2019-09-28 NOTE — Discharge Instructions (Signed)

## 2019-09-28 NOTE — Progress Notes (Addendum)
       Subjective: CC: Patient reports she is feeling better. Pain has improved from an 8/10 yesterday to a 6/10 today. Pain is mainly suprapubic, more towards the right. Denies n/v. She is tolerating cld. Passing flatus. Hard BM yesterday.  Objective: Vital signs in last 24 hours: Temp:  [98.3 F (36.8 C)-99.4 F (37.4 C)] 98.3 F (36.8 C) (04/20 0524) Pulse Rate:  [65-77] 65 (04/20 0524) Resp:  [16-17] 16 (04/20 0524) BP: (116-149)/(63-76) 149/63 (04/20 0524) SpO2:  [98 %-99 %] 99 % (04/20 0524) Last BM Date: 09/27/19  Intake/Output from previous day: 04/19 0701 - 04/20 0700 In: 2624.6 [P.O.:450; I.V.:1487.6; IV Piggyback:687.1] Out: 600 [Urine:600] Intake/Output this shift: Total I/O In: 432.5 [I.V.:394.7; IV Piggyback:37.7] Out: -   PE: Gen: Awake and alert, NAD Lungs: Normal rate and effort Abd: Soft, ND, very minimally tender in the suprapubic abdomen without rigidity or guarding. Otherwise NT. +BS Msk: No edema.  Lab Results:  Recent Labs    09/27/19 0358 09/28/19 0346  WBC 10.7* 8.4  HGB 12.1 11.6*  HCT 36.6 35.9*  PLT 347 354   BMET Recent Labs    09/27/19 0358 09/28/19 0346  NA 133* 136  K 2.9* 3.7  CL 96* 100  CO2 23 28  GLUCOSE 105* 144*  BUN 6* <5*  CREATININE 0.77 0.80  CALCIUM 8.7* 8.6*   PT/INR No results for input(s): LABPROT, INR in the last 72 hours. CMP     Component Value Date/Time   NA 136 09/28/2019 0346   K 3.7 09/28/2019 0346   CL 100 09/28/2019 0346   CO2 28 09/28/2019 0346   GLUCOSE 144 (H) 09/28/2019 0346   BUN <5 (L) 09/28/2019 0346   CREATININE 0.80 09/28/2019 0346   CALCIUM 8.6 (L) 09/28/2019 0346   CALCIUM 9.8 11/28/2011 1244   PROT 6.9 09/28/2019 0346   ALBUMIN 2.5 (L) 09/28/2019 0346   AST 21 09/28/2019 0346   ALT 17 09/28/2019 0346   ALKPHOS 59 09/28/2019 0346   BILITOT 0.4 09/28/2019 0346   GFRNONAA >60 09/28/2019 0346   GFRAA >60 09/28/2019 0346   Lipase     Component Value Date/Time   LIPASE 19  09/24/2019 1031       Studies/Results: No results found.  Anti-infectives: Anti-infectives (From admission, onward)   Start     Dose/Rate Route Frequency Ordered Stop   09/24/19 2200  piperacillin-tazobactam (ZOSYN) IVPB 3.375 g  Status:  Discontinued     3.375 g 100 mL/hr over 30 Minutes Intravenous Every 8 hours 09/24/19 1448 09/24/19 1557   09/24/19 2200  piperacillin-tazobactam (ZOSYN) IVPB 3.375 g     3.375 g 12.5 mL/hr over 240 Minutes Intravenous Every 8 hours 09/24/19 1558     09/24/19 1315  piperacillin-tazobactam (ZOSYN) IVPB 3.375 g     3.375 g 100 mL/hr over 30 Minutes Intravenous  Once 09/24/19 1310 09/24/19 1501       Assessment/Plan HTN Obesity  Diverticulitis with contained microperforation - Pain has improved, WBC normalized, afebrile. Tolerating cld and having bowel function. Adv to fulls - Cont IV abx  - Last colonoscopy 11/24/2018  FEN -FLD VTE -SCDs, Lovenox ID -[id number]   LOS: 4 days    Jillyn Ledger , [facility] 09/28/2019, 8:23 AM Please see Amion for pager number during day hours 7:00am-4:30pm

## 2019-09-28 NOTE — Progress Notes (Signed)
PROGRESS NOTE    Frances Gray  [id number] DOB: 14-Feb-1951 DOA: 09/24/2019 PCP: Binnie Rail, MD     Brief Narrative:  Frances Gray is a 69 y.o. BF PMHx HTN, HLD,  DM type II controlled with complication, MORBID obesity, nephrolithiasis, diverticulosis (s/p colonoscopy June 2020)  Presents with complaints of lower abdominal pain started yesterday afternoon around 2:30 PM.  Patient reports that she had a late breakfast prior to onset of symptoms.  Pain was described as sharp and constant.  It started in the middle of her abdomen and then moved from right to left.  Reported associated symptoms of nausea, but denied having any vomiting.  Her last bowel movement was yesterday morning.  Since that time she reports that she is not been able to pass flatus.  ED Course: Upon admission into the emergency department patient was seen to be febrile with blood pressures 142/76 and 154/84, and all other vital signs maintained.  Labs significant for sodium 134, potassium 3.4, chloride 97, and glucose 137.CT scan of the abdomen and pelvis with contrast significant for sigmoid diverticulitis with perforation without abscess.  General surgery was formally consulted.  Patient was given pain medication, 1 L of normal saline IV fluids, Zofran, and empiric antibiotics of Zosyn.  TRH called to admit.    Subjective: 4/20 A/O x4, negative CP, negative S OB, negative abdominal pain.  Negative postprandial pain on full liquid diet.  Positive BM today.   Assessment & Plan:   Principal Problem:   Perforation of sigmoid colon due to diverticulitis Active Problems:   Hypertension   Severe obesity (BMI >= 40) (HCC)   Diabetes mellitus without complication (HCC)   Hypokalemia    Acute Sigmoid Diverticulitis with contained microperforation -Per surgery currently no abscess and perforation not significant enough to warrant surgery. -Surgery did discuss the possibility that if she worsens she may need a  repeat scan to rule out abscess vs surgery and colostomy.   -N.p.o. -Zosyn x7 days -Normal saline IV fluids at 75 mL/h -4/20 patient on full liquid diet, if she does well in next 24 hours and surgery has no issue with advancing diet would start her on bland diet.    Leukocytosis Recent Labs  Lab 09/24/19 1031 09/25/19 0617 09/26/19 0336 09/27/19 0358 09/28/19 0346  WBC 9.4 11.8* 12.8* 10.7* 8.4  -We will continue to monitor  Essential HTN -Hyzaar 100-25 mg daily   DM type II controlled without complication -123XX123 hemoglobin A1c= 6.5 -Diet controlled.  -4/19 patient requests nutrition consult to discuss appropriate diet for patient with HTN, diabetes, HLD.  HLD -4/18 LDL = 86  -4/18 increase Crestor 5 mg daily; start when patient tolerates diet food  Morbid obesity: BMI 47.89 kg/m -See diabetes  Hypokalemia -Potassium goal> 4  Hypomagnesmia -Magnesium goal> 2     DVT prophylaxis:  Lovenox Code  Status: Full Family Communication: 4/20 husband at bedside discussed plan of care answered all questions Disposition Plan:  1.  Where the patient is from 2.  Anticipated d/c place. 3.  Barriers to d/c improvement of current condition vs surgery   Consultants:  [facility] Dr. Vikki Ports, Kae Heller   Procedures/Significant Events:     I have personally reviewed and interpreted all radiology studies and my findings are as above.  VENTILATOR SETTINGS:    Cultures   Antimicrobials: Anti-infectives (From admission, onward)   Start     Dose/Rate Stop   09/24/19 2200  piperacillin-tazobactam (ZOSYN) IVPB 3.375 g  Status:  Discontinued     3.375 g 100 mL/hr over 30 Minutes 09/24/19 1557   09/24/19 2200  piperacillin-tazobactam (ZOSYN) IVPB 3.375 g     3.375 g 12.5 mL/hr over 240 Minutes     09/24/19 1315  piperacillin-tazobactam (ZOSYN) IVPB 3.375 g     3.375 g 100 mL/hr over 30 Minutes 09/24/19 1501       Devices    LINES / TUBES:      Continuous  Infusions: . sodium chloride 75 mL/hr at 09/28/19 0108  . piperacillin-tazobactam (ZOSYN)  IV 3.375 g (09/28/19 0510)     Objective: Vitals:   09/27/19 0409 09/27/19 1347 09/27/19 2131 09/28/19 0524  BP: 133/70 (!) 142/76 116/68 (!) 149/63  Pulse: 72 74 77 65  Resp: 17 17 16 16   Temp: 98.3 F (36.8 C) 99.4 F (37.4 C) 98.5 F (36.9 C) 98.3 F (36.8 C)  TempSrc: Oral Oral Oral Oral  SpO2: 97% 99% 98% 99%  Weight:      Height:        Intake/Output Summary (Last 24 hours) at 09/28/2019 1415 Last data filed at 09/28/2019 [id number] Gross per 24 hour  Intake 1801.31 ml  Output 400 ml  Net 1401.31 ml   Filed Weights   09/24/19 1022  Weight: 126.6 kg   Physical Exam:  General: A/O x4, no acute respiratory distress Eyes: negative scleral hemorrhage, negative anisocoria, negative icterus ENT: Negative Runny nose, negative gingival bleeding, Neck:  Negative scars, masses, torticollis, lymphadenopathy, JVD Lungs: Clear to auscultation bilaterally without wheezes or crackles Cardiovascular: Regular rate and rhythm without murmur gallop or rub normal S1 and S2 Abdomen: MORBIDLY OBESE, negative abdominal pain, nondistended, positive soft, bowel sounds, no rebound, no ascites, no appreciable mass Extremities: No significant cyanosis, clubbing, or edema bilateral lower extremities Skin: Negative rashes, lesions, ulcers Psychiatric:  Negative depression, negative anxiety, negative fatigue, negative mania  Central nervous system:  Cranial nerves II through XII intact, tongue/uvula midline, all extremities muscle strength 5/5, sensation intact throughout, finger nose finger bilateral within normal limits, quick finger touch bilateral within normal limits, negative Romberg sign, heel to shin bilateral within normal limits, standing on 1 foot bilateral within normal limits, walking on tiptoes within normal limits, walking on heels within normal limits, negative dysarthria, negative expressive  aphasia, negative receptive aphasia.  .     Data Reviewed: Care during the described time interval was provided by me .  I have reviewed this patient's available data, including medical history, events of note, physical examination, and all test results as part of my evaluation.  CBC: Recent Labs  Lab 09/24/19 1031 09/25/19 0617 09/26/19 0336 09/27/19 0358 09/28/19 0346  WBC 9.4 11.8* 12.8* 10.7* 8.4  NEUTROABS 7.6  --  10.3* 8.3* 5.9  HGB 13.5 12.7 12.2 12.1 11.6*  HCT 42.3 38.7 37.9 36.6 35.9*  MCV 86.3 86.6 87.9 85.5 85.7  PLT 322 309 296 347 [id number]   Basic Metabolic Panel: Recent Labs  Lab 09/24/19 1031 09/25/19 0617 09/25/19 1531 09/26/19 0336 09/27/19 0358 09/28/19 0346  NA 134* 138  --  133* 133* 136  K 3.4* 3.3*  --  3.7 2.9* 3.7  CL 97* 103  --  99 96* 100  CO2 26 28  --  23 23 28   GLUCOSE 139* 133*  --  107* 105* 144*  BUN 7* 6*  --  <5* 6* <5*  CREATININE 0.71 0.70  --  0.73 0.77 0.80  CALCIUM 9.2 8.7*  --  8.6*  8.7* 8.6*  MG  --   --  1.8 1.8 1.9 2.1  PHOS  --   --  2.4* 2.8 3.2 2.9   GFR: Estimated Creatinine Clearance: 87.5 mL/min (by C-G formula based on SCr of 0.8 mg/dL). Liver Function Tests: Recent Labs  Lab 09/24/19 1031 09/26/19 0336 09/27/19 0358 09/28/19 0346  AST 20 19 19 21   ALT 22 17 15 17   ALKPHOS 59 60 64 59  BILITOT 0.7 0.9 1.0 0.4  PROT 7.9 6.9 6.9 6.9  ALBUMIN 3.6 2.7* 2.5* 2.5*   Recent Labs  Lab 09/24/19 1031  LIPASE 19   No results for input(s): AMMONIA in the last 168 hours. Coagulation Profile: No results for input(s): INR, PROTIME in the last 168 hours. Cardiac Enzymes: No results for input(s): CKTOTAL, CKMB, CKMBINDEX, TROPONINI in the last 168 hours. BNP (last 3 results) No results for input(s): PROBNP in the last 8760 hours. HbA1C: Recent Labs    09/25/19 1531  HGBA1C 6.5*   CBG: No results for input(s): GLUCAP in the last 168 hours. Lipid Profile: Recent Labs    09/25/19 1531 09/26/19 0336  CHOL  168 164  HDL 63 64  LDLCALC 92 86  TRIG 65 71  CHOLHDL 2.7 2.6   Thyroid Function Tests: No results for input(s): TSH, T4TOTAL, FREET4, T3FREE, THYROIDAB in the last 72 hours. Anemia Panel: No results for input(s): VITAMINB12, FOLATE, FERRITIN, TIBC, IRON, RETICCTPCT in the last 72 hours. Sepsis Labs: Recent Labs  Lab 09/25/19 1531 09/25/19 1831 09/26/19 0336 09/27/19 0358  PROCALCITON <0.10  --  0.11 <0.10  LATICACIDVEN 0.7 1.1  --   --     Recent Results (from the past 240 hour(s))  Blood culture (routine x 2)     Status: None (Preliminary result)   Collection Time: 09/24/19  1:20 PM   Specimen: BLOOD  Result Value Ref Range Status   Specimen Description BLOOD LEFT ANTECUBITAL  Final   Special Requests   Final    BOTTLES DRAWN AEROBIC AND ANAEROBIC Blood Culture adequate volume   Culture   Final    NO GROWTH 4 DAYS Performed at [facility], [address], [place], [place] [address]    Report Status PENDING  Incomplete  SARS CORONAVIRUS 2 (TAT 6-24 HRS) Nasopharyngeal Nasopharyngeal Swab     Status: None   Collection Time: 09/24/19  1:51 PM   Specimen: Nasopharyngeal Swab  Result Value Ref Range Status   SARS Coronavirus 2 NEGATIVE NEGATIVE Final    Comment: (NOTE) SARS-CoV-2 target nucleic acids are NOT DETECTED. The SARS-CoV-2 RNA is generally detectable in upper and lower respiratory specimens during the acute phase of infection. Negative results do not preclude SARS-CoV-2 infection, do not rule out co-infections with other pathogens, and should not be used as the sole basis for treatment or other patient management decisions. Negative results must be combined with clinical observations, patient history, and epidemiological information. The expected result is Negative. Fact Sheet for Patients: SugarRoll.be Fact Sheet for Healthcare Providers: https://www.[employer]-mathews.com/ This test is not yet approved or cleared  by the [place] FDA and  has been authorized for detection and/or diagnosis of SARS-CoV-2 by FDA under an Emergency Use Authorization (EUA). This EUA will remain  in effect (meaning this test can be used) for the duration of the COVID-19 declaration under Section 56 4(b)(1) of the Act, 21 U.S.C. section 360bbb-3(b)(1), unless the authorization is terminated or revoked sooner. Performed at [facility], [place]  [address], [place], [place] [address]   Blood culture (routine x 2)     Status: None (Preliminary result)   Collection Time: 09/24/19  1:57 PM   Specimen: BLOOD  Result Value Ref Range Status   Specimen Description BLOOD RIGHT ANTECUBITAL  Final   Special Requests   Final    BOTTLES DRAWN AEROBIC AND ANAEROBIC Blood Culture results may not be optimal due to an inadequate volume of blood received in culture bottles   Culture   Final    NO GROWTH 4 DAYS Performed at [facility], [place] [address] [place], [place] [address]    Report Status PENDING  Incomplete         Radiology Studies: No results found.      Scheduled Meds: . docusate sodium  100 mg Oral BID  . enoxaparin (LOVENOX) injection  40 mg Subcutaneous Q24H  . losartan  100 mg Oral Daily   And  . hydrochlorothiazide  25 mg Oral Daily  . polyethylene glycol  17 g Oral Daily  . sodium chloride flush  3 mL Intravenous Q12H   Continuous Infusions: . sodium chloride 75 mL/hr at 09/28/19 0108  . piperacillin-tazobactam (ZOSYN)  IV 3.375 g (09/28/19 0510)     LOS: 4 days    Time spent:40 min   Randi [employer], Geraldo Docker, MD [facility] Pager [phone]  If 7PM-7AM, please contact night-coverage [email or web address] Password [email or web address] 09/28/2019, 2:15 PM

## 2019-09-28 NOTE — Progress Notes (Signed)
Contacted by CCMD when patient had an elevated HR at 1251. Patient was ambulating from the bedside commode at this time. Patient is alert and oriented. NAD. Will recheck vitals once patient is back to bed.

## 2019-09-29 LAB — COMPREHENSIVE METABOLIC PANEL
ALT: 20 U/L (ref 0–44)
AST: 41 U/L (ref 15–41)
Albumin: 2.4 g/dL — ABNORMAL LOW (ref 3.5–5.0)
Alkaline Phosphatase: 53 U/L (ref 38–126)
Anion gap: 7 (ref 5–15)
BUN: 6 mg/dL — ABNORMAL LOW (ref 8–23)
CO2: 27 mmol/L (ref 22–32)
Calcium: 8.6 mg/dL — ABNORMAL LOW (ref 8.9–10.3)
Chloride: 105 mmol/L (ref 98–111)
Creatinine, Ser: 0.74 mg/dL (ref 0.44–1.00)
GFR calc Af Amer: >60 mL/min (ref >60)
GFR calc non Af Amer: >60 mL/min (ref >60)
Glucose, Bld: 112 mg/dL — ABNORMAL HIGH (ref 70–99)
Potassium: 4.4 mmol/L (ref 3.5–5.1)
Sodium: 139 mmol/L (ref 135–145)
Total Bilirubin: 1.7 mg/dL — ABNORMAL HIGH (ref 0.3–1.2)
Total Protein: 6.6 g/dL (ref 6.5–8.1)

## 2019-09-29 LAB — MAGNESIUM: Magnesium: 2.1 mg/dL (ref 1.7–2.4)

## 2019-09-29 LAB — CULTURE, BLOOD (ROUTINE X 2)
Culture: NO GROWTH
Culture: NO GROWTH
Special Requests: ADEQUATE

## 2019-09-29 LAB — CBC WITH DIFFERENTIAL/PLATELET
Abs Immature Granulocytes: 0.03 10*3/uL (ref 0.00–0.07)
Basophils Absolute: 0 10*3/uL (ref 0.0–0.1)
Basophils Relative: 0 %
Eosinophils Absolute: 0.3 10*3/uL (ref 0.0–0.5)
Eosinophils Relative: 4 %
HCT: 35.4 % — ABNORMAL LOW (ref 36.0–46.0)
Hemoglobin: 11.6 g/dL — ABNORMAL LOW (ref 12.0–15.0)
Immature Granulocytes: 0 %
Lymphocytes Relative: 19 %
Lymphs Abs: 1.3 10*3/uL (ref 0.7–4.0)
MCH: 28.2 pg (ref 26.0–34.0)
MCHC: 32.8 g/dL (ref 30.0–36.0)
MCV: 86.1 fL (ref 80.0–100.0)
Monocytes Absolute: 0.9 10*3/uL (ref 0.1–1.0)
Monocytes Relative: 13 %
Neutro Abs: 4.4 10*3/uL (ref 1.7–7.7)
Neutrophils Relative %: 64 %
Platelets: 349 10*3/uL (ref 150–400)
RBC: 4.11 MIL/uL (ref 3.87–5.11)
RDW: 13.3 % (ref 11.5–15.5)
WBC: 6.9 10*3/uL (ref 4.0–10.5)
nRBC: 0 % (ref 0.0–0.2)

## 2019-09-29 LAB — PHOSPHORUS: Phosphorus: 3.6 mg/dL (ref 2.5–4.6)

## 2019-09-29 MED ORDER — LORATADINE 10 MG PO TABS
10.0000 mg | ORAL_TABLET | Freq: Every day | ORAL | Status: DC
  Administered 2019-09-29 – 2019-09-30 (×2): 10 mg via ORAL
  Filled 2019-09-29 (×2): qty 1

## 2019-09-29 MED ORDER — FLUTICASONE PROPIONATE 50 MCG/ACT NA SUSP
1.0000 | Freq: Every day | NASAL | Status: DC
  Administered 2019-09-29 – 2019-09-30 (×2): 1 via NASAL
  Filled 2019-09-29: qty 16

## 2019-09-29 NOTE — Progress Notes (Signed)
PROGRESS NOTE    Frances Gray  [id number] DOB: [birth year] DOA: 09/24/2019 PCP: Binnie Rail, MD     Brief Narrative:  Frances Gray is a 69 y.o. BF PMHx HTN, HLD,  DM type II controlled with complication, MORBID obesity, nephrolithiasis, diverticulosis (s/p colonoscopy June 2020)  Presents with complaints of lower abdominal pain started yesterday afternoon around 2:30 PM.  Patient reports that she had a late breakfast prior to onset of symptoms.  Pain was described as sharp and constant.  It started in the middle of her abdomen and then moved from right to left.  Reported associated symptoms of nausea, but denied having any vomiting.  Her last bowel movement was yesterday morning.  Since that time she reports that she is not been able to pass flatus.  ED Course: Upon admission into the emergency department patient was seen to be febrile with blood pressures 142/76 and 154/84, and all other vital signs maintained.  Labs significant for sodium 134, potassium 3.4, chloride 97, and glucose 137.CT scan of the abdomen and pelvis with contrast significant for sigmoid diverticulitis with perforation without abscess.  General surgery was formally consulted.  Patient was given pain medication, 1 L of normal saline IV fluids, Zofran, and empiric antibiotics of Zosyn.  TRH called to admit.   Subjective: Oriented x4; denies chest pain, no nausea, no vomiting, no fever.  Reports having some trouble tolerating soft diet around lunchtime.  Worsening abdominal pain.  Reports partial improvement bowels.   Assessment & Plan:   Principal Problem:   Perforation of sigmoid colon due to diverticulitis Active Problems:   Hypertension   Severe obesity (BMI >= 40) (HCC)   Diabetes mellitus without complication (HCC)   Hypokalemia    Acute Sigmoid Diverticulitis with contained microperforation -Per surgery currently no abscess and perforation not significant enough to warrant surgery. -Surgery did  discuss the possibility that if she worsens she may need a repeat scan to rule out abscess vs surgery and colostomy.   -Continue IV antibiotics, plan is for a total of 10 days; once fully tolerating PO's can be transitioned to oral augmentin.  -Continue gentle hydration -Diet has been advanced to soft following surgery recommendations; patient able to tolerate in the next 2 to 4 hours will be ready to go home.  Leukocytosis Recent Labs  Lab 09/25/19 0617 09/26/19 0336 09/27/19 0358 09/28/19 0346 09/29/19 0322  WBC 11.8* 12.8* 10.7* 8.4 6.9  -WBC is within normal limits currently. -Continue to follow trend intermittently.  Essential HTN -Continue Hyzaar 100-25 mg daily  -Blood pressure stable and well-controlled  DM type II controlled without complication -123XX123 hemoglobin A1c= 6.5 -Diet controlled as an outpatient.  -CBGs has remained stable.  HLD -4/18 LDL = 86  -Planning to resume Crestor 5 mg by mouth on daily basis when fully tolerating orals.  Morbid obesity: BMI 47.89 kg/m -Low calorie diet, portion control and increase physical activity discussed with patient.  Hypokalemia -Potassium goal> 4 -Currently stable -Follow electrolytes trend  Hypomagnesmia -Magnesium goal> 2 -Currently stable -Will follow electrolytes trend  Allergy rhinitis -Flonase and loratadine has been resumed.   DVT prophylaxis:  Lovenox Code  Status: Full Family Communication: No family at bedside; patient expressed that she will update her husband.  All questions answered to her satisfaction. Disposition Plan:  1.  Where the patient is from: home 2.  Anticipated d/c place:  Home 3.  Barriers to d/c ability to tolerate p.o. nutrition and oral medications; still having ongoing  abdominal pain and after having her diet advanced from clear liquids has not been well-tolerated.  Continue supportive care and for now IV antibiotics.  Follow general surgery recommendations.  Hopefully discharge  09/30/2019.   Consultants:  [facility]S Dr. Vikki Ports, Kae Heller   Procedures/Significant Events:     I have personally reviewed and interpreted all radiology studies and my findings are as above.  VENTILATOR SETTINGS:    Cultures   Antimicrobials: Anti-infectives (From admission, onward)   Start     Dose/Rate Stop   09/24/19 2200  piperacillin-tazobactam (ZOSYN) IVPB 3.375 g  Status:  Discontinued     3.375 g 100 mL/hr over 30 Minutes 09/24/19 1557   09/24/19 2200  piperacillin-tazobactam (ZOSYN) IVPB 3.375 g     3.375 g 12.5 mL/hr over 240 Minutes     09/24/19 1315  piperacillin-tazobactam (ZOSYN) IVPB 3.375 g     3.375 g 100 mL/hr over 30 Minutes 09/24/19 1501       Devices    LINES / TUBES:    Continuous Infusions: . sodium chloride 75 mL/hr at 09/29/19 0829  . piperacillin-tazobactam (ZOSYN)  IV 12.5 mL/hr at 09/29/19 0700     Objective: Vitals:   09/28/19 1438 09/28/19 2027 09/29/19 0357 09/29/19 1338  BP: 119/65 (!) 119/56 (!) 154/67 (!) 153/98  Pulse: 71 69 62 63  Resp: 14 18 16 18   Temp: 98 F (36.7 C) (!) 97.4 F (36.3 C) 98.4 F (36.9 C) 98.2 F (36.8 C)  TempSrc: Oral Oral Oral Oral  SpO2: 99% 99% 98% 99%  Weight:      Height:        Intake/Output Summary (Last 24 hours) at 09/29/2019 1443 Last data filed at 09/29/2019 1025 Gross per 24 hour  Intake 1859.91 ml  Output 950 ml  Net 909.91 ml   Filed Weights   09/24/19 1022  Weight: 126.6 kg   Physical Exam: General exam: Alert, awake, oriented x 3, not feeling good today; reports still ongoing intermittent abdominal pain, decreased appetite and after diet advance not tolerated well due to worsening pain in her tablet.  Patient also reports some nasal congestion and is requesting allergy medications. Respiratory system: Clear to auscultation. Respiratory effort normal. Cardiovascular system:RRR. No murmurs, rubs, gallops. Gastrointestinal system: Obese, abdomen is nondistended, soft and  nontender. No organomegaly or masses felt. Normal bowel sounds heard. Central nervous system: Alert and oriented. No focal neurological deficits. Extremities: No cyanosis or clubbing. Skin: No rashes, no petechiae. Psychiatry: Judgement and insight appear normal. Mood & affect appropriate.    Data Reviewed: Care during the described time interval was provided by me .  I have reviewed this patient's available data, including medical history, events of note, physical examination, and all test results as part of my evaluation.  CBC: Recent Labs  Lab 09/24/19 1031 09/24/19 1031 09/25/19 0617 09/26/19 0336 09/27/19 0358 09/28/19 0346 09/29/19 0322  WBC 9.4   < > 11.8* 12.8* 10.7* 8.4 6.9  NEUTROABS 7.6  --   --  10.3* 8.3* 5.9 4.4  HGB 13.5   < > 12.7 12.2 12.1 11.6* 11.6*  HCT 42.3   < > 38.7 37.9 36.6 35.9* 35.4*  MCV 86.3   < > 86.6 87.9 85.5 85.7 86.1  PLT 322   < > 309 296 347 354 349   < > = values in this interval not displayed.   Basic Metabolic Panel: Recent Labs  Lab 09/25/19 0617 09/25/19 1531 09/26/19 0336 09/27/19 0358 09/28/19 0346 09/29/19  0322  NA 138  --  133* 133* 136 139  K 3.3*  --  3.7 2.9* 3.7 4.4  CL 103  --  99 96* 100 105  CO2 28  --  23 23 28 27   GLUCOSE 133*  --  107* 105* 144* 112*  BUN 6*  --  <5* 6* <5* 6*  CREATININE 0.70  --  0.73 0.77 0.80 0.74  CALCIUM 8.7*  --  8.6* 8.7* 8.6* 8.6*  MG  --  1.8 1.8 1.9 2.1 2.1  PHOS  --  2.4* 2.8 3.2 2.9 3.6   GFR: Estimated Creatinine Clearance: 87.5 mL/min (by C-G formula based on SCr of 0.74 mg/dL).   Liver Function Tests: Recent Labs  Lab 09/24/19 1031 09/26/19 0336 09/27/19 0358 09/28/19 0346 09/29/19 0322  AST 20 19 19 21  41  ALT 22 17 15 17 20   ALKPHOS 59 60 64 59 53  BILITOT 0.7 0.9 1.0 0.4 1.7*  PROT 7.9 6.9 6.9 6.9 6.6  ALBUMIN 3.6 2.7* 2.5* 2.5* 2.4*   Recent Labs  Lab 09/24/19 1031  LIPASE 19   Sepsis Labs: Recent Labs  Lab 09/25/19 1531 09/25/19 1831 09/26/19 0336  09/27/19 0358  PROCALCITON <0.10  --  0.11 <0.10  LATICACIDVEN 0.7 1.1  --   --     Recent Results (from the past 240 hour(s))  Blood culture (routine x 2)     Status: None   Collection Time: 09/24/19  1:20 PM   Specimen: BLOOD  Result Value Ref Range Status   Specimen Description BLOOD LEFT ANTECUBITAL  Final   Special Requests   Final    BOTTLES DRAWN AEROBIC AND ANAEROBIC Blood Culture adequate volume   Culture   Final    NO GROWTH 5 DAYS Performed at [facility], [address] [place], [place] [address]    Report Status 09/29/2019 FINAL  Final  SARS CORONAVIRUS 2 (TAT 6-24 HRS) Nasopharyngeal Nasopharyngeal Swab     Status: None   Collection Time: 09/24/19  1:51 PM   Specimen: Nasopharyngeal Swab  Result Value Ref Range Status   SARS Coronavirus 2 NEGATIVE NEGATIVE Final    Comment: (NOTE) SARS-CoV-2 target nucleic acids are NOT DETECTED. The SARS-CoV-2 RNA is generally detectable in upper and lower respiratory specimens during the acute phase of infection. Negative results do not preclude SARS-CoV-2 infection, do not rule out co-infections with other pathogens, and should not be used as the sole basis for treatment or other patient management decisions. Negative results must be combined with clinical observations, patient history, and epidemiological information. The expected result is Negative. Fact Sheet for Patients: SugarRoll.be Fact Sheet for Healthcare Providers: https://www.woods-mathews.com/ This test is not yet approved or cleared by the [place] FDA and  has been authorized for detection and/or diagnosis of SARS-CoV-2 by FDA under an Emergency Use Authorization (EUA). This EUA will remain  in effect (meaning this test can be used) for the duration of the COVID-19 declaration under Section 56 4(b)(1) of the Act, 21 U.S.C. section 360bbb-3(b)(1), unless the authorization is terminated or revoked  sooner. Performed at [facility], [place] [address], [place], [place] [address]   Blood culture (routine x 2)     Status: None   Collection Time: 09/24/19  1:57 PM   Specimen: BLOOD  Result Value Ref Range Status   Specimen Description BLOOD RIGHT ANTECUBITAL  Final   Special Requests   Final    BOTTLES DRAWN AEROBIC AND  ANAEROBIC Blood Culture results may not be optimal due to an inadequate volume of blood received in culture bottles   Culture   Final    NO GROWTH 5 DAYS Performed at [place] [address] [place], [place] [address]    Report Status 09/29/2019 FINAL  Final     Radiology Studies: No results found.   Scheduled Meds: . docusate sodium  100 mg Oral BID  . enoxaparin (LOVENOX) injection  40 mg Subcutaneous Q24H  . fluticasone  1 spray Each Nare Daily  . losartan  100 mg Oral Daily   And  . hydrochlorothiazide  25 mg Oral Daily  . loratadine  10 mg Oral Daily  . polyethylene glycol  17 g Oral Daily  . sodium chloride flush  3 mL Intravenous Q12H   Continuous Infusions: . sodium chloride 75 mL/hr at 09/29/19 0829  . piperacillin-tazobactam (ZOSYN)  IV 12.5 mL/hr at 09/29/19 0700     LOS: 5 days    Time spent:40 min   Barton Dubois, MD [facility] Pager [phone]   09/29/2019, 2:43 PM

## 2019-09-29 NOTE — Progress Notes (Signed)
   09/29/19 1400  Provider Notification  Provider Name/Title Barton Dubois MD  Date Provider Notified 09/29/19  Time Provider Notified 1400  Notification Type Page  Notification Reason Requested by patient/family (pt request allergy meds)  Response See new orders  Date of Provider Response 09/29/19  Time of Provider Response 1405

## 2019-09-29 NOTE — Progress Notes (Signed)
       Subjective: CC: Gas pains  Doing well. Had some "gas pains" in her lower abdomen this morning that passed after trying to use the restroom. No tenderness currently. Tolerating fld without increased abdominal pain, n/v. She is passing flatus. Soft BM yesterday.   Objective: Vital signs in last 24 hours: Temp:  [97.4 F (36.3 C)-98.4 F (36.9 C)] 98.4 F (36.9 C) (04/21 0357) Pulse Rate:  [62-71] 62 (04/21 0357) Resp:  [14-18] 16 (04/21 0357) BP: (119-154)/(56-67) 154/67 (04/21 0357) SpO2:  [98 %-99 %] 98 % (04/21 0357) Last BM Date: 09/28/19  Intake/Output from previous day: 04/20 0701 - 04/21 0700 In: 1883.4 [I.V.:1717.9; IV Piggyback:165.4] Out: 950 [Urine:950] Intake/Output this shift: No intake/output data recorded.  PE: Gen: Awake and alert, NAD, Up in chair  Lungs: Normal rate and effort Abd: Soft, ND, NT without rigidity or guarding. +BS Msk: No edema.  Lab Results:  Recent Labs    09/28/19 0346 09/29/19 0322  WBC 8.4 6.9  HGB 11.6* 11.6*  HCT 35.9* 35.4*  PLT 354 349   BMET Recent Labs    09/28/19 0346 09/29/19 0322  NA 136 139  K 3.7 4.4  CL 100 105  CO2 28 27  GLUCOSE 144* 112*  BUN <5* 6*  CREATININE 0.80 0.74  CALCIUM 8.6* 8.6*   PT/INR No results for input(s): LABPROT, INR in the last 72 hours. CMP     Component Value Date/Time   NA 139 09/29/2019 0322   K 4.4 09/29/2019 0322   CL 105 09/29/2019 0322   CO2 27 09/29/2019 0322   GLUCOSE 112 (H) 09/29/2019 0322   BUN 6 (L) 09/29/2019 0322   CREATININE 0.74 09/29/2019 0322   CALCIUM 8.6 (L) 09/29/2019 0322   CALCIUM 9.8 11/28/2011 1244   PROT 6.6 09/29/2019 0322   ALBUMIN 2.4 (L) 09/29/2019 0322   AST 41 09/29/2019 0322   ALT 20 09/29/2019 0322   ALKPHOS 53 09/29/2019 0322   BILITOT 1.7 (H) 09/29/2019 0322   GFRNONAA >60 09/29/2019 0322   GFRAA >60 09/29/2019 0322   Lipase     Component Value Date/Time   LIPASE 19 09/24/2019 1031       Studies/Results: No results  found.  Anti-infectives: Anti-infectives (From admission, onward)   Start     Dose/Rate Route Frequency Ordered Stop   09/24/19 2200  piperacillin-tazobactam (ZOSYN) IVPB 3.375 g  Status:  Discontinued     3.375 g 100 mL/hr over 30 Minutes Intravenous Every 8 hours 09/24/19 1448 09/24/19 1557   09/24/19 2200  piperacillin-tazobactam (ZOSYN) IVPB 3.375 g     3.375 g 12.5 mL/hr over 240 Minutes Intravenous Every 8 hours 09/24/19 1558     09/24/19 1315  piperacillin-tazobactam (ZOSYN) IVPB 3.375 g     3.375 g 100 mL/hr over 30 Minutes Intravenous  Once 09/24/19 1310 09/24/19 1501       Assessment/Plan HTN Obesity  Diverticulitis with contained microperforation - Pain has resolved, WBC normalized, afebrile. Tolerating fldand having bowel function. Adv to soft diet. If tolerates, okay for d/c later today from our standpoint.  - Cont abx for 10-14 days total - Last colonoscopy 11/24/2018  FEN -Soft VTE -SCDs, Lovenox ID -Zosyn 4/16>>  Plan: Adv to soft diet. If tolerates, okay for d/c later today from our standpoint.    LOS: 5 days    Jillyn Ledger , [facility] 09/29/2019, 7:38 AM Please see Amion for pager number during day hours 7:00am-4:30pm

## 2019-09-30 ENCOUNTER — Telehealth: Payer: Self-pay | Admitting: *Deleted

## 2019-09-30 LAB — CBC WITH DIFFERENTIAL/PLATELET
Abs Immature Granulocytes: 0.05 10*3/uL (ref 0.00–0.07)
Basophils Absolute: 0 10*3/uL (ref 0.0–0.1)
Basophils Relative: 0 %
Eosinophils Absolute: 0.2 10*3/uL (ref 0.0–0.5)
Eosinophils Relative: 3 %
HCT: 35.9 % — ABNORMAL LOW (ref 36.0–46.0)
Hemoglobin: 11.8 g/dL — ABNORMAL LOW (ref 12.0–15.0)
Immature Granulocytes: 1 %
Lymphocytes Relative: 17 %
Lymphs Abs: 1.4 10*3/uL (ref 0.7–4.0)
MCH: 28.6 pg (ref 26.0–34.0)
MCHC: 32.9 g/dL (ref 30.0–36.0)
MCV: 87.1 fL (ref 80.0–100.0)
Monocytes Absolute: 0.9 10*3/uL (ref 0.1–1.0)
Monocytes Relative: 12 %
Neutro Abs: 5.2 10*3/uL (ref 1.7–7.7)
Neutrophils Relative %: 67 %
Platelets: 379 10*3/uL (ref 150–400)
RBC: 4.12 MIL/uL (ref 3.87–5.11)
RDW: 13.3 % (ref 11.5–15.5)
WBC: 7.9 10*3/uL (ref 4.0–10.5)
nRBC: 0 % (ref 0.0–0.2)

## 2019-09-30 LAB — COMPREHENSIVE METABOLIC PANEL
ALT: 20 U/L (ref 0–44)
AST: 22 U/L (ref 15–41)
Albumin: 2.6 g/dL — ABNORMAL LOW (ref 3.5–5.0)
Alkaline Phosphatase: 53 U/L (ref 38–126)
Anion gap: 10 (ref 5–15)
BUN: <5 mg/dL — ABNORMAL LOW (ref 8–23)
CO2: 24 mmol/L (ref 22–32)
Calcium: 8.8 mg/dL — ABNORMAL LOW (ref 8.9–10.3)
Chloride: 103 mmol/L (ref 98–111)
Creatinine, Ser: 0.58 mg/dL (ref 0.44–1.00)
GFR calc Af Amer: >60 mL/min (ref >60)
GFR calc non Af Amer: >60 mL/min (ref >60)
Glucose, Bld: 118 mg/dL — ABNORMAL HIGH (ref 70–99)
Potassium: 3.4 mmol/L — ABNORMAL LOW (ref 3.5–5.1)
Sodium: 137 mmol/L (ref 135–145)
Total Bilirubin: 0.2 mg/dL — ABNORMAL LOW (ref 0.3–1.2)
Total Protein: 6.7 g/dL (ref 6.5–8.1)

## 2019-09-30 LAB — PHOSPHORUS: Phosphorus: 3.5 mg/dL (ref 2.5–4.6)

## 2019-09-30 LAB — MAGNESIUM: Magnesium: 1.9 mg/dL (ref 1.7–2.4)

## 2019-09-30 MED ORDER — AMOXICILLIN-POT CLAVULANATE 875-125 MG PO TABS: 1.0000 | ORAL_TABLET | Freq: Two times a day (BID) | ORAL | 0 refills | Status: DC

## 2019-09-30 MED ORDER — SIMETHICONE 80 MG PO CHEW
80.0000 mg | CHEWABLE_TABLET | Freq: Four times a day (QID) | ORAL | Status: DC | PRN
  Administered 2019-09-30: 80 mg via ORAL
  Filled 2019-09-30: qty 1

## 2019-09-30 MED ORDER — AMOXICILLIN-POT CLAVULANATE 875-125 MG PO TABS
1.0000 | ORAL_TABLET | Freq: Two times a day (BID) | ORAL | Status: DC
  Administered 2019-09-30: 1 via ORAL
  Filled 2019-09-30: qty 1

## 2019-09-30 NOTE — Plan of Care (Signed)

## 2019-09-30 NOTE — Progress Notes (Signed)
AVS given and reviewed with pt. Medications discussed. All questions answered to satisfaction. Pt verbalized understanding of information given. Pt to be escorted off the unit with all belongings via wheelchair by volunteer services.

## 2019-09-30 NOTE — Discharge Summary (Signed)
Physician Discharge Summary  Betzabet Imbriano [id number] DOB: January 14, 1951 DOA: 09/24/2019  PCP: Binnie Rail, MD  Admit date: 09/24/2019 Discharge date: 09/30/2019  Admitted From: Home  Disposition:  Home   Recommendations for Outpatient Follow-up and new medication changes:  1. Follow up with Dr. Quay Burow in 7 days.  2. Continue antibiotic therapy for 7 more days.  3. Continue to advance diet as tolerated.   Home Health: no  Equipment/Devices: no    Discharge Condition: stable  CODE STATUS: full  Diet recommendation: soft and advance as tolerated.   Brief/Interim Summary: Patient was admitted to the hospital with a working diagnosis of acute perforated diverticulitis.  69 year old female who presented with abdominal pain.  She does have the significant past medical history for hypertension, dyslipidemia, type 2 diabetes mellitus and obesity class III.  He reported sudden onset of abdominal pain, for about 24 hours, sharp and constant in the middle of the abdomen and then radiated to the lower quadrants.  On her initial physical examination her blood pressure was 142/76, heart rate 62, respiratory rate 16, temperature 98, oxygen saturation 99%, her lungs were clear to auscultation bilaterally, heart S1-S2 present rhythmic, her abdomen was soft and nondistended, no lower extremity edema Sodium 134, potassium 3.4, chloride 97, bicarb 26, glucose 139, BUN 7, creatinine 0.71, white cell count 9.4, hemoglobin 13.5, hematocrit 42.3, platelets 322.  Urinalysis with specific gravity 1.020.  SARS COVID-19 negative.  CT of the abdomen had acute perforated sigmoid diverticulitis without abscess.  Diffuse hepatic steatosis.  EKG 65 bpm, normal axis, normal intervals, sinus rhythm, no ST segment or T wave changes.  Patient received medical therapy with intravenous antibiotics, IV analgesic,and intravenous fluids.  Surgery was consulted, with recommendations to continue conservative medical care.  Patient  responded well, her diet was advanced with good toleration, she will finish antibiotic therapy as an outpatient.   1.  Acute perforated sigmoid diverticulitis.  Patient was admitted to the telemetry ward, she received conservative medical therapy with broad-spectrum IV antibiotic therapy with Zosyn, intravenous fluids, as needed analgesics and antiemetics.  Initially kept nothing by mouth and surgery was consulted.  Recommendations to continue medical therapy.  She has responded well, with improvement of her symptoms, peak leukocytosis 12.8, at discharge down to 7.9.   Her diet was advanced good obliteration, she will finish antibiotic therapy with Augmentin for 7 more days.  2.  Hypertension.  Continue blood pressure control with losartan and Hctz 100/25 mg daily.  3.  Type 2 diabetes mellitus, hemoglobin A1c 6.5./Dyslipidemia.  Patient was placed on insulin sliding scale for glucose coverage and monitoring.  Continue statin therapy.  4.  Obesity class III.  Calculated BMI 47.8. patient will need follow-up as an outpatient.  5.  Hypokalemia and hypomagnesemia.  Patient had electrolytes corrected, discharge potassium 3.4, magnesium 1.9.  Stable kidney function with a serum creatinine of 0.58, and bicarb 24.  Discharge Diagnoses:  Principal Problem:   Perforation of sigmoid colon due to diverticulitis Active Problems:   Hypertension   Severe obesity (BMI >= 40) (HCC)   Diabetes mellitus without complication ([place])   Hypokalemia    Discharge Instructions   Allergies as of 09/30/2019      Reactions   Amlodipine Cough   Metformin And Related    diarrhea   Pravastatin    Joint ache      Medication List    TAKE these medications   amoxicillin-clavulanate 875-125 MG tablet Commonly known as: AUGMENTIN Take 1  tablet by mouth every 12 (twelve) hours for 7 days.   calcium carbonate 600 MG Tabs tablet Commonly known as: Calcium 600 Take 1 tablet (600 mg total) by mouth 2 (two) times  daily with a meal.   Fluocinolone Acetonide Scalp 0.01 % Oil Apply 1 mL topically at bedtime as needed (itchy scalp).   fluticasone 50 MCG/ACT nasal spray Commonly known as: FLONASE Place 1 spray into both nostrils daily as needed for allergies or rhinitis.   losartan-hydrochlorothiazide 100-25 MG tablet Commonly known as: HYZAAR TAKE 1 TABLET BY MOUTH EVERY DAY   multivitamin tablet Take 1 tablet by mouth every morning.   rosuvastatin 5 MG tablet Commonly known as: CRESTOR TAKE 1 TABLET TWICE A WEEK What changed:   how much to take  how to take this  when to take this  additional instructions   sennosides-docusate sodium 8.6-50 MG tablet Commonly known as: SENOKOT-S Take 1 tablet by mouth at bedtime.   Vitamin D3 50 MCG (2000 UT) Tabs Take 2,000 Units by mouth every morning.      Follow-up Information    Binnie Rail, MD Follow up.   Specialty: Internal Medicine Contact information: [place] [address] [phone]          Allergies  Allergen Reactions  . Amlodipine Cough  . Metformin And Related     diarrhea  . Pravastatin     Joint ache    Consultations:  Surgery    Procedures/Studies: CT ABDOMEN PELVIS W CONTRAST  Result Date: 09/24/2019 CLINICAL DATA:  Left abdominal pain and nausea. History of kidney stones and diverticulitis. EXAM: CT ABDOMEN AND PELVIS WITH CONTRAST TECHNIQUE: Multidetector CT imaging of the abdomen and pelvis was performed using the standard protocol following bolus administration of intravenous contrast. CONTRAST:  163mL OMNIPAQUE IOHEXOL 300 MG/ML  SOLN COMPARISON:  Abdomen radiograph dated 09/18/2011. FINDINGS: Lower chest: Minimal bibasilar atelectasis/scarring. Hepatobiliary: Diffuse low density of the liver relative to the spleen. Normal appearing gallbladder. Pancreas: Unremarkable. No pancreatic ductal dilatation or surrounding inflammatory changes. Spleen: Normal in size without focal  abnormality. Adrenals/Urinary Tract: Adrenal glands are unremarkable. Kidneys are normal, without renal calculi, focal lesion, or hydronephrosis. Bladder is unremarkable. Stomach/Bowel: Small hiatal hernia. Normal appearing small bowel and appendix. Multiple sigmoid colon diverticula. One diverticulum is larger with multiple small locules of adjacent and nearby extra limb air and mild soft tissue stranding. No fluid collection seen. Vascular/Lymphatic: No significant vascular findings are present. No enlarged abdominal or pelvic lymph nodes. Reproductive: Uterus and bilateral adnexa are unremarkable. Other: No abdominal wall hernia or abnormality. No abdominopelvic ascites. Musculoskeletal: Lumbar and lower thoracic spine degenerative changes. IMPRESSION: 1. Acute, perforated sigmoid diverticulitis without abscess. A perforated sigmoid colon neoplasm is less likely but not excluded. 2. Small hiatal hernia. 3. Diffuse hepatic steatosis. Electronically Signed   By: Claudie Revering M.D.   On: 09/24/2019 13:05        Subjective: Patient is feeling better, abdominal pain has improved, no nausea or vomiting, positive bowel movement.   Discharge Exam: Vitals:   09/29/19 2239 09/30/19 0606  BP: (!) 144/64 (!) 146/81  Pulse: 62 65  Resp: 15 18  Temp: 98.6 F (37 C) 98.3 F (36.8 C)  SpO2: 99% 99%   Vitals:   09/29/19 0357 09/29/19 1338 09/29/19 2239 09/30/19 0606  BP: (!) 154/67 (!) 153/98 (!) 144/64 (!) 146/81  Pulse: 62 63 62 65  Resp: 16 18 15 18   Temp: 98.4 F (36.9  C) 98.2 F (36.8 C) 98.6 F (37 C) 98.3 F (36.8 C)  TempSrc: Oral Oral Oral Oral  SpO2: 98% 99% 99% 99%  Weight:      Height:        General: Not in pain or dyspnea.  Neurology: Awake and alert, non focal  E ENT: no pallor, no icterus, oral mucosa moist Cardiovascular: No JVD. S1-S2 present, rhythmic, no gallops, rubs, or murmurs. No lower extremity edema. Pulmonary: vesicular breath sounds bilaterally, adequate air  movement, no wheezing, rhonchi or rales. Gastrointestinal. Abdomen with, no organomegaly, non tender, no rebound or guarding Skin. No rashes Musculoskeletal: no joint deformities   The results of significant diagnostics from this hospitalization (including imaging, microbiology, ancillary and laboratory) are listed below for reference.     Microbiology: Recent Results (from the past 240 hour(s))  Blood culture (routine x 2)     Status: None   Collection Time: 09/24/19  1:20 PM   Specimen: BLOOD  Result Value Ref Range Status   Specimen Description BLOOD LEFT ANTECUBITAL  Final   Special Requests   Final    BOTTLES DRAWN AEROBIC AND ANAEROBIC Blood Culture adequate volume   Culture   Final    NO GROWTH 5 DAYS Performed at [facility], [address], [place], [place] [address]    Report Status 09/29/2019 FINAL  Final  SARS CORONAVIRUS 2 (TAT 6-24 HRS) Nasopharyngeal Nasopharyngeal Swab     Status: None   Collection Time: 09/24/19  1:51 PM   Specimen: Nasopharyngeal Swab  Result Value Ref Range Status   SARS Coronavirus 2 NEGATIVE NEGATIVE Final    Comment: (NOTE) SARS-CoV-2 target nucleic acids are NOT DETECTED. The SARS-CoV-2 RNA is generally detectable in upper and lower respiratory specimens during the acute phase of infection. Negative results do not preclude SARS-CoV-2 infection, do not rule out co-infections with other pathogens, and should not be used as the sole basis for treatment or other patient management decisions. Negative results must be combined with clinical observations, patient history, and epidemiological information. The expected result is Negative. Fact Sheet for Patients: SugarRoll.be Fact Sheet for Healthcare Providers: https://www.woods-mathews.com/ This test is not yet approved or cleared by the [place] FDA and  has been authorized for detection and/or diagnosis of SARS-CoV-2 by FDA under an  Emergency Use Authorization (EUA). This EUA will remain  in effect (meaning this test can be used) for the duration of the COVID-19 declaration under Section 56 4(b)(1) of the Act, 21 U.S.C. section 360bbb-3(b)(1), unless the authorization is terminated or revoked sooner. Performed at [facility], [place] [address], [place],  [address]   Blood culture (routine x 2)     Status: None   Collection Time: 09/24/19  1:57 PM   Specimen: BLOOD  Result Value Ref Range Status   Specimen Description BLOOD RIGHT ANTECUBITAL  Final   Special Requests   Final    BOTTLES DRAWN AEROBIC AND ANAEROBIC Blood Culture results may not be optimal due to an inadequate volume of blood received in culture bottles   Culture   Final    NO GROWTH 5 DAYS Performed at [facility], [place] [address], [place],  [address]    Report Status 09/29/2019 FINAL  Final     Labs: BNP (last 3 results) No results for input(s): BNP in the last 8760 hours. Basic Metabolic Panel: Recent Labs  Lab 09/26/19 0336 09/27/19 0358 09/28/19 0346 09/29/19 0322 09/30/19 0112  NA  133* 133* 136 139 137  K 3.7 2.9* 3.7 4.4 3.4*  CL 99 96* 100 105 103  CO2 23 23 28 27 24   GLUCOSE 107* 105* 144* 112* 118*  BUN <5* 6* <5* 6* <5*  CREATININE 0.73 0.77 0.80 0.74 0.58  CALCIUM 8.6* 8.7* 8.6* 8.6* 8.8*  MG 1.8 1.9 2.1 2.1 1.9  PHOS 2.8 3.2 2.9 3.6 3.5   Liver Function Tests: Recent Labs  Lab 09/26/19 0336 09/27/19 0358 09/28/19 0346 09/29/19 0322 09/30/19 0112  AST 19 19 21  41 22  ALT 17 15 17 20 20   ALKPHOS 60 64 59 53 53  BILITOT 0.9 1.0 0.4 1.7* 0.2*  PROT 6.9 6.9 6.9 6.6 6.7  ALBUMIN 2.7* 2.5* 2.5* 2.4* 2.6*   Recent Labs  Lab 09/24/19 1031  LIPASE 19   No results for input(s): AMMONIA in the last 168 hours. CBC: Recent Labs  Lab 09/26/19 0336 09/27/19 0358 09/28/19 0346 09/29/19 0322 09/30/19 0112  WBC 12.8* 10.7* 8.4 6.9 7.9  NEUTROABS 10.3* 8.3* 5.9 4.4 5.2  HGB 12.2 12.1 11.6*  11.6* 11.8*  HCT 37.9 36.6 35.9* 35.4* 35.9*  MCV 87.9 85.5 85.7 86.1 87.1  PLT 296 347 354 349 379   Cardiac Enzymes: No results for input(s): CKTOTAL, CKMB, CKMBINDEX, TROPONINI in the last 168 hours. BNP: Invalid input(s): POCBNP CBG: No results for input(s): GLUCAP in the last 168 hours. D-Dimer No results for input(s): DDIMER in the last 72 hours. Hgb A1c No results for input(s): HGBA1C in the last 72 hours. Lipid Profile No results for input(s): CHOL, HDL, LDLCALC, TRIG, CHOLHDL, LDLDIRECT in the last 72 hours. Thyroid function studies No results for input(s): TSH, T4TOTAL, T3FREE, THYROIDAB in the last 72 hours.  Invalid input(s): FREET3 Anemia work up No results for input(s): VITAMINB12, FOLATE, FERRITIN, TIBC, IRON, RETICCTPCT in the last 72 hours. Urinalysis    Component Value Date/Time   COLORURINE YELLOW 09/24/2019 1310   APPEARANCEUR HAZY (A) 09/24/2019 1310   LABSPEC 1.020 09/24/2019 1310   PHURINE 7.0 09/24/2019 1310   GLUCOSEU NEGATIVE 09/24/2019 1310   GLUCOSEU NEGATIVE 05/12/2014 1120   HGBUR NEGATIVE 09/24/2019 1310   BILIRUBINUR NEGATIVE 09/24/2019 1310   KETONESUR NEGATIVE 09/24/2019 1310   PROTEINUR NEGATIVE 09/24/2019 1310   UROBILINOGEN 0.2 05/12/2014 1120   NITRITE NEGATIVE 09/24/2019 1310   LEUKOCYTESUR NEGATIVE 09/24/2019 1310   Sepsis Labs Invalid input(s): PROCALCITONIN,  WBC,  LACTICIDVEN Microbiology Recent Results (from the past 240 hour(s))  Blood culture (routine x 2)     Status: None   Collection Time: 09/24/19  1:20 PM   Specimen: BLOOD  Result Value Ref Range Status   Specimen Description BLOOD LEFT ANTECUBITAL  Final   Special Requests   Final    BOTTLES DRAWN AEROBIC AND ANAEROBIC Blood Culture adequate volume   Culture   Final    NO GROWTH 5 DAYS Performed at [facility], [place] [address] [place], [place] [address]    Report Status 09/29/2019 FINAL  Final  SARS CORONAVIRUS 2 (TAT 6-24 HRS) Nasopharyngeal  Nasopharyngeal Swab     Status: None   Collection Time: 09/24/19  1:51 PM   Specimen: Nasopharyngeal Swab  Result Value Ref Range Status   SARS Coronavirus 2 NEGATIVE NEGATIVE Final    Comment: (NOTE) SARS-CoV-2 target nucleic acids are NOT DETECTED. The SARS-CoV-2 RNA is generally detectable in upper and lower respiratory specimens during the acute phase of infection. Negative results do not preclude SARS-CoV-2 infection, do  not rule out co-infections with other pathogens, and should not be used as the sole basis for treatment or other patient management decisions. Negative results must be combined with clinical observations, patient history, and epidemiological information. The expected result is Negative. Fact Sheet for Patients: SugarRoll.be Fact Sheet for Healthcare Providers: https://www.woods-mathews.com/ This test is not yet approved or cleared by the [place] FDA and  has been authorized for detection and/or diagnosis of SARS-CoV-2 by FDA under an Emergency Use Authorization (EUA). This EUA will remain  in effect (meaning this test can be used) for the duration of the COVID-19 declaration under Section 56 4(b)(1) of the Act, 21 U.S.C. section 360bbb-3(b)(1), unless the authorization is terminated or revoked sooner. Performed at [facility], [place] [address] [place], [place] [address]   Blood culture (routine x 2)     Status: None   Collection Time: 09/24/19  1:57 PM   Specimen: BLOOD  Result Value Ref Range Status   Specimen Description BLOOD RIGHT ANTECUBITAL  Final   Special Requests   Final    BOTTLES DRAWN AEROBIC AND ANAEROBIC Blood Culture results may not be optimal due to an inadequate volume of blood received in culture bottles   Culture   Final    NO GROWTH 5 DAYS Performed at [facility], [place] [address], [place], [place] [address]    Report Status 09/29/2019 FINAL  Final     Time coordinating  discharge: 45 minutes  SIGNED:   Tawni Millers, MD  [facility] 09/30/2019, 10:18 AM

## 2019-09-30 NOTE — Progress Notes (Addendum)
Subjective: CC: Reports some lower abdominal gas pains that subside after she sits on the toilet and passes flatus. BM this am and yesterday which is soft. NT on my exam with light and deep palpation. Afebrile overnight. Did not eat dinner but tolerating soft diet for breakfast without increased abdominal pain n/v. She ate most of her breakfast tray which consistent of bagel and oatmeal. Mobilizing without difficulty.    Objective: Vital signs in last 24 hours: Temp:  [98.2 F (36.8 C)-98.6 F (37 C)] 98.3 F (36.8 C) (04/22 0606) Pulse Rate:  [62-65] 65 (04/22 0606) Resp:  [15-18] 18 (04/22 0606) BP: (144-153)/(64-98) 146/81 (04/22 0606) SpO2:  [99 %] 99 % (04/22 0606) Last BM Date: 09/30/19  Intake/Output from previous day: 04/21 0701 - 04/22 0700 In: 2187.7 [P.O.:297; I.V.:1689.3; IV Piggyback:201.4] Out: 1000 [Urine:1000] Intake/Output this shift: No intake/output data recorded.  PE: Gen: Awake and alert, NAD, Up in chair  Lungs: Normal rate and effort Abd: Soft, ND, NT on light and deep palpation, without rigidity or guarding. +BS Msk: No edema.  Lab Results:  Recent Labs    09/29/19 0322 09/30/19 0112  WBC 6.9 7.9  HGB 11.6* 11.8*  HCT 35.4* 35.9*  PLT 349 379   BMET Recent Labs    09/29/19 0322 09/30/19 0112  NA 139 137  K 4.4 3.4*  CL 105 103  CO2 27 24  GLUCOSE 112* 118*  BUN 6* <5*  CREATININE 0.74 0.58  CALCIUM 8.6* 8.8*   PT/INR No results for input(s): LABPROT, INR in the last 72 hours. CMP     Component Value Date/Time   NA 137 09/30/2019 0112   K 3.4 (L) 09/30/2019 0112   CL 103 09/30/2019 0112   CO2 24 09/30/2019 0112   GLUCOSE 118 (H) 09/30/2019 0112   BUN <5 (L) 09/30/2019 0112   CREATININE 0.58 09/30/2019 0112   CALCIUM 8.8 (L) 09/30/2019 0112   CALCIUM 9.8 11/28/2011 1244   PROT 6.7 09/30/2019 0112   ALBUMIN 2.6 (L) 09/30/2019 0112   AST 22 09/30/2019 0112   ALT 20 09/30/2019 0112   ALKPHOS 53 09/30/2019 0112   BILITOT 0.2 (L) 09/30/2019 0112   GFRNONAA >60 09/30/2019 0112   GFRAA >60 09/30/2019 0112   Lipase     Component Value Date/Time   LIPASE 19 09/24/2019 1031       Studies/Results: No results found.  Anti-infectives: Anti-infectives (From admission, onward)   Start     Dose/Rate Route Frequency Ordered Stop   09/24/19 2200  piperacillin-tazobactam (ZOSYN) IVPB 3.375 g  Status:  Discontinued     3.375 g 100 mL/hr over 30 Minutes Intravenous Every 8 hours 09/24/19 1448 09/24/19 1557   09/24/19 2200  piperacillin-tazobactam (ZOSYN) IVPB 3.375 g     3.375 g 12.5 mL/hr over 240 Minutes Intravenous Every 8 hours 09/24/19 1558     09/24/19 1315  piperacillin-tazobactam (ZOSYN) IVPB 3.375 g     3.375 g 100 mL/hr over 30 Minutes Intravenous  Once 09/24/19 1310 09/24/19 1501       Assessment/Plan HTN Obesity  Diverticulitis with contained microperforation - NT on exam, WBC normalized, afebrile.Tolerating diet and having bowel function.Can be d/c'd from our standpoint - Cont abx for 10-14 days total. Can switch to oral abx  - Last colonoscopy 11/24/2018  FEN -Soft VTE -SCDs, Lovenox ID -Zosyn 4/16>>     LOS: 6 days    Frances Gray , [facility]  09/30/2019, 9:28 AM Please see Amion for pager number during day hours 7:00am-4:30pm

## 2019-09-30 NOTE — Telephone Encounter (Signed)
Transition Care Management Follow-up Telephone Call   Date discharged? 09/30/19   How have you been since you were released from the hospital? Pt states she not doing good. Just came home this am   Do you understand why you were in the hospital? YES   Do you understand the discharge instructions? YES   Where were you discharged to? HOME   Items Reviewed:  Medications reviewed: YES, she states medications are the same. She was given Amoxicillin but pharmacy has not called them to pick up, but will start tomorrow  Allergies reviewed: YES  Dietary changes reviewed: YES, Soft food  Referrals reviewed: No referral recommeded   Functional Questionnaire:   Activities of Daily Living (ADLs):   She states she are independent in the following: ambulation, bathing and hygiene, feeding, continence, grooming, toileting and dressing States she doesn't require assistance    Any transportation issues/concerns?: NO   Any patient concerns? NO   Confirmed importance and date/time of follow-up visits scheduled YES, appt 10/08/19  Provider Appointment booked with Dr. Quay Burow  Confirmed with patient if condition begins to worsen call PCP or go to the ER.  Patient was given the office number and encouraged to call back with question or concerns.  : YES

## 2019-10-07 NOTE — Patient Instructions (Addendum)
  Continue to slowly advance your diet.  If your right sided abdominal pain does not continue to improve or worsens please call.    Medications reviewed and updated.  Changes include :   none

## 2019-10-07 NOTE — Progress Notes (Signed)
Subjective:    Patient ID: Frances Gray, female    DOB: 01-21-1951, 69 y.o.   MRN: [id number]  HPI The patient is here for follow up from the hospital.    Admitted 09/24/19 - 09/30/19  Recommendations for Outpatient Follow-up and new medication changes:  1. Follow up with Dr. Quay Burow in 7 days.  2. Continue antibiotic therapy for 7 more days.  3. Continue to advance diet as tolerated.    She went to the ED for sudden onset of abdominal pain x 24 hrs.  It was sharp, constant in the middle of her abdomen and radiated to the lower quadrants.   In ED VSS, blood work, EKG  unremarkable.  CT of Ab/pelvis showed perforated sigmoid diverticulitis w/o abscess.  Diffuse hepatic steatosis.    She received IV Abx, IV analgesic and IVF.  Surgery consulted and advised conservative treatment.  She responded well, diet advanced and d/c'd to home on oral abx.    Acute perforated sigmoid diverticulitis: She received IVF, IV Abx with zosyn and as needed analgesics/ antiemetics NPO and diet advanced as tolerated Symptoms improved WBC - improved from 12.8 to 7.9 Discharged home with Augmentin x 7 days  Hypertension: Continue losartan-hctz 100/25 mg daily  Diabetes: a1c 6.5% Insulin SS in hospital  Hyperlipidemia: On statin  Obesity: BMI 47.8  Hypokalemia, hypomagnesemia: Electrolytes corrected   Perforated diverticulitis:  She still has some tenderness in her RLQ, but it is better.  She still has difficulty going to the bathroom.  She takes two senokot plus at night.  She does not think it empties her out like it should.  She is eating a soft diet.  She denies fever/chillls, blood in the stool or black stool, nausea.  She still feels fatigued. She completed the antibiotic.   She is taking all of her chronic medications as prescribed, but did not take her BP medication yet today.  She will when she gets home.    Medications and allergies reviewed with patient and updated if  appropriate.  Patient Active Problem List   Diagnosis Date Noted  . Hepatic steatosis 10/08/2019  . Perforation of sigmoid colon due to diverticulitis 09/24/2019  . Hypokalemia 09/24/2019  . Plantar fasciitis, right 12/05/2016  . Diabetes mellitus without complication ([place]) 99991111  . Nodule of flexor tendon sheath 01/23/2016  . Midline low back pain without sciatica 01/23/2016  . Knee osteoarthritis 12/06/2014  . Hyperlipidemia   . Osteopenia 12/29/2013  . Severe obesity (BMI >= 40) ([place]) 11/16/2012  . 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 Anemia, unspecified 08/14/2011  . Vitamin D deficiency disease 08/14/2011  . Kidney stone 07/03/2011  . GERD (gastroesophageal reflux disease)   . Hypertension     Current Outpatient Medications on File Prior to Visit  Medication Sig Dispense Refill  . calcium carbonate (CALCIUM 600) 600 MG TABS tablet Take 1 tablet (600 mg total) by mouth 2 (two) times daily with a meal. 60 tablet 6  . Cholecalciferol (VITAMIN D3) 2000 UNITS TABS Take 2,000 Units by mouth every morning.     . Fluocinolone Acetonide Scalp 0.01 % OIL Apply 1 mL topically at bedtime as needed (itchy scalp).     . fluticasone (FLONASE) 50 MCG/ACT nasal spray Place 1 spray into both nostrils daily as needed for allergies or rhinitis.   0  . losartan-hydrochlorothiazide (HYZAAR) 100-25 MG tablet TAKE 1 TABLET BY MOUTH EVERY DAY (Patient taking differently: Take 1 tablet by mouth daily. ) 90 tablet  1  . Multiple Vitamin (MULTIVITAMIN) tablet Take 1 tablet by mouth every morning.     . rosuvastatin (CRESTOR) 5 MG tablet TAKE 1 TABLET TWICE A WEEK (Patient taking differently: Take 5 mg by mouth 2 (two) times a week. TAKE 1 TABLET TWICE A WEEK Monday, Friday) 24 tablet 1  . sennosides-docusate sodium (SENOKOT-S) 8.6-50 MG tablet Take 1 tablet by mouth at bedtime.     No current facility-administered medications on file prior to visit.    Past Medical History:  Diagnosis Date   . Allergic rhinitis, seasonal    takes Claritin daily as needed and uses Flonase if needed  . Allergy   . 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 Diverticulitis   . GERD (gastroesophageal reflux disease)    doesn't take any meds  . Heart murmur    no symptoms  . History of bronchitis winter of 2014  . History of colon polyps    benign  . History of kidney stones   . Hyperlipidemia   . Hypertension    takes Hyzaar daily  . Vitamin D deficiency disease 08/14/2011   takes Vit D daily    Past Surgical History:  Procedure Laterality Date  . BREAST BIOPSY Left   . Breast ultrasound Right 03/02/13   The 21mm complex cyst in the right breast is probably benign. Follow-up in 6 months with bilateral mammogram and (R) ultrasound  . Breast Ultrasound Right 09/02/13   There is a benign 3 mm complex cyst in the (R) breast at 9 o'clock  . COLONOSCOPY    . ESOPHAGUS SURGERY  1996   tumor removed   (benign)  . KNEE ARTHROSCOPY     bilateral  . LITHOTRIPSY  08/2011   R ureteral stone  . POLYPECTOMY    . TOTAL KNEE ARTHROPLASTY Left 11/16/2012   Procedure: TOTAL KNEE ARTHROPLASTY- left;  Surgeon: Johnny Bridge, MD;  Location: [facility];  Service: Orthopedics;  Laterality: Left;  . TOTAL KNEE ARTHROPLASTY Right 12/06/2014   Procedure: TOTAL KNEE ARTHROPLASTY;  Surgeon: Marchia Bond, MD;  Location: [facility];  Service: Orthopedics;  Laterality: Right;    Social History   Socioeconomic History  . Marital status: Married    Spouse name: Not on file  . Number of children: Not on file  . Years of education: Not on file  . Highest education level: Not on file  Occupational History  . Occupation: retired  Tobacco Use  . Smoking status: Former Smoker    Packs/day: 0.25    Years: 10.00    Pack years: 2.50  . Smokeless tobacco: Never Used  . Tobacco comment: quit smoking 27yrs ago  Substance and Sexual Activity  . Alcohol use: No  . Drug use: No  . Sexual activity: Yes    Birth  control/protection: Post-menopausal  Other Topics Concern  . Not on file  Social History Narrative   Lives with spouse and g-son, temporarily mother in law (05/2014)   Social Determinants of Health   Financial Resource Strain:   . Difficulty of Paying Living Expenses:   Food Insecurity:   . Worried About Charity fundraiser in the Last Year:   . Arboriculturist in the Last Year:   Transportation Needs:   . Film/video editor (Medical):   Marland Kitchen Lack of Transportation (Non-Medical):   Physical Activity:   . Days of Exercise per Week:   . Minutes  of Exercise per Session:   Stress:   . Feeling of Stress :   Social Connections:   . Frequency of Communication with Friends and Family:   . Frequency of Social Gatherings with Friends and Family:   . Attends Religious Services:   . Active Member of Clubs or Organizations:   . Attends Archivist Meetings:   Marland Kitchen Marital Status:     Family History  Problem Relation Age of Onset  . Ovarian cancer Mother   . Colon cancer Sister 20  . Breast cancer Sister   . Esophageal cancer Neg Hx   . Colon polyps Neg Hx   . Rectal cancer Neg Hx   . Stomach cancer Neg Hx     Review of Systems  Constitutional: Negative for chills and fever.  Respiratory: Negative for cough, shortness of breath and wheezing.   Cardiovascular: Negative for chest pain, palpitations and leg swelling.  Gastrointestinal: Positive for abdominal pain (RLQ) and constipation. Negative for blood in stool (no black stool) and nausea.       GERD  Genitourinary: Negative for dysuria and hematuria (earleir this week - not the past couple of days).  Neurological: Negative for light-headedness and headaches.       Objective:   Vitals:   10/08/19 0916  BP: (!) 152/82  Pulse: 67  Resp: 16  Temp: 98.4 F (36.9 C)  SpO2: 99%   BP Readings from Last 3 Encounters:  10/08/19 (!) 152/82  09/30/19 (!) 146/81  06/01/19 140/88   Wt Readings from Last 3 Encounters:    10/08/19 273 lb (123.8 kg)  09/24/19 279 lb (126.6 kg)  06/01/19 279 lb (126.6 kg)   Body mass index is 46.86 kg/m.   Physical Exam    Constitutional: Appears well-developed and well-nourished. No distress.  HENT:  Head: Normocephalic and atraumatic.  Neck: Neck supple. No tracheal deviation present. No thyromegaly present.  No cervical lymphadenopathy Cardiovascular: Normal rate, regular rhythm and normal heart sounds.   No murmur heard. No carotid bruit .  No edema Pulmonary/Chest: Effort normal and breath sounds normal. No respiratory distress. No has no wheezes. No rales.  Abdomen: obese, soft, nontender diffusely with palpation even in RLQ, but she does feel discomfort there w/o palpation Skin: Skin is warm and dry. Not diaphoretic.  Psychiatric: Normal mood and affect. Behavior is normal.    Diabetic Foot Exam - Simple   Simple Foot Form Diabetic Foot exam was performed with the following findings: Yes 10/08/2019 10:09 AM  Visual Inspection No deformities, no ulcerations, no other skin breakdown bilaterally: Yes Sensation Testing Intact to touch and monofilament testing bilaterally: Yes Pulse Check Posterior Tibialis and Dorsalis pulse intact bilaterally: Yes Comments      Lab Results  Component Value Date   WBC 7.9 09/30/2019   HGB 11.8 (L) 09/30/2019   HCT 35.9 (L) 09/30/2019   PLT 379 09/30/2019   GLUCOSE 118 (H) 09/30/2019   CHOL 164 09/26/2019   TRIG 71 09/26/2019   HDL 64 09/26/2019   LDLCALC 86 09/26/2019   ALT 20 09/30/2019   AST 22 09/30/2019   NA 137 09/30/2019   K 3.4 (L) 09/30/2019   CL 103 09/30/2019   CREATININE 0.58 09/30/2019   BUN <5 (L) 09/30/2019   CO2 24 09/30/2019   TSH 3.36 06/01/2019   INR 1.04 11/10/2012   HGBA1C 6.5 (H) 09/25/2019    CT ABDOMEN PELVIS W CONTRAST CLINICAL DATA:  Left abdominal pain and nausea. History  of kidney stones and diverticulitis.  EXAM: CT ABDOMEN AND PELVIS WITH  CONTRAST  TECHNIQUE: Multidetector CT imaging of the abdomen and pelvis was performed using the standard protocol following bolus administration of intravenous contrast.  CONTRAST:  149mL OMNIPAQUE IOHEXOL 300 MG/ML  SOLN  COMPARISON:  Abdomen radiograph dated 09/18/2011.  FINDINGS: Lower chest: Minimal bibasilar atelectasis/scarring.  Hepatobiliary: Diffuse low density of the liver relative to the spleen. Normal appearing gallbladder.  Pancreas: Unremarkable. No pancreatic ductal dilatation or surrounding inflammatory changes.  Spleen: Normal in size without focal abnormality.  Adrenals/Urinary Tract: Adrenal glands are unremarkable. Kidneys are normal, without renal calculi, focal lesion, or hydronephrosis. Bladder is unremarkable.  Stomach/Bowel: Small hiatal hernia. Normal appearing small bowel and appendix. Multiple sigmoid colon diverticula. One diverticulum is larger with multiple small locules of adjacent and nearby extra limb air and mild soft tissue stranding. No fluid collection seen.  Vascular/Lymphatic: No significant vascular findings are present. No enlarged abdominal or pelvic lymph nodes.  Reproductive: Uterus and bilateral adnexa are unremarkable.  Other: No abdominal wall hernia or abnormality. No abdominopelvic ascites.  Musculoskeletal: Lumbar and lower thoracic spine degenerative changes.  IMPRESSION: 1. Acute, perforated sigmoid diverticulitis without abscess. A perforated sigmoid colon neoplasm is less likely but not excluded. 2. Small hiatal hernia. 3. Diffuse hepatic steatosis.  Electronically Signed   By: Claudie Revering M.D.   On: 09/24/2019 13:05     Assessment & Plan:    See Problem List for Assessment and Plan of chronic medical problems.    This visit occurred during the SARS-CoV-2 public health emergency.  Safety protocols were in place, including screening questions prior to the visit, additional usage of staff PPE, and  extensive cleaning of exam room while observing appropriate contact time as indicated for disinfecting solutions.

## 2019-10-08 ENCOUNTER — Ambulatory Visit (INDEPENDENT_AMBULATORY_CARE_PROVIDER_SITE_OTHER): Payer: Medicare Other | Admitting: Internal Medicine

## 2019-10-08 ENCOUNTER — Other Ambulatory Visit: Payer: Self-pay

## 2019-10-08 ENCOUNTER — Encounter: Payer: Self-pay | Admitting: Internal Medicine

## 2019-10-08 VITALS — BP 152/82 | HR 67 | Temp 98.4°F | Resp 16 | Ht 64.0 in | Wt 273.0 lb

## 2019-10-08 DIAGNOSIS — E119 Type 2 diabetes mellitus without complications: Secondary | ICD-10-CM | POA: Diagnosis not present

## 2019-10-08 DIAGNOSIS — K760 Fatty (change of) liver, not elsewhere classified: Secondary | ICD-10-CM | POA: Diagnosis not present

## 2019-10-08 DIAGNOSIS — K5720 Diverticulitis of large intestine with perforation and abscess without bleeding: Secondary | ICD-10-CM

## 2019-10-08 DIAGNOSIS — I10 Essential (primary) hypertension: Secondary | ICD-10-CM

## 2019-10-08 DIAGNOSIS — E7849 Other hyperlipidemia: Secondary | ICD-10-CM

## 2019-10-08 NOTE — Assessment & Plan Note (Signed)
Chronic ?Encouraged weight loss ?

## 2019-10-08 NOTE — Assessment & Plan Note (Signed)
Chronic Blood pressure elevated here today, but she did not take her medication yet-she will take it when she gets home Continue current medication at current dose

## 2019-10-08 NOTE — Assessment & Plan Note (Signed)
Chronic Continue statin 

## 2019-10-08 NOTE — Assessment & Plan Note (Signed)
Lab Results  Component Value Date   HGBA1C 6.5 (H) 09/25/2019    Controlled Diet controlled

## 2019-10-08 NOTE — Assessment & Plan Note (Signed)
Seen on CT  and does increase her risk of liver cirrhosis Advised weight loss as the only thing that will help

## 2019-10-08 NOTE — Assessment & Plan Note (Signed)
Acute She has had diverticulitis at least 4 times-this is the worst episode she has had and she did experience perforation Symptoms have improved with conservative treatment She is still experiencing some pain and we will monitor this closely-if this worsens or persists we may need to reimage She will try to slowly advance her diet, but if anything worsens as far as her pain she will regress Continue bowel regimen for chronic constipation Continue drinking plenty of fluids Colonoscopy up-to-date-done June 2020

## 2019-10-18 DIAGNOSIS — L661 Lichen planopilaris: Secondary | ICD-10-CM | POA: Insufficient documentation

## 2019-11-22 ENCOUNTER — Other Ambulatory Visit: Payer: Self-pay | Admitting: Internal Medicine

## 2019-11-25 LAB — HM MAMMOGRAPHY

## 2019-11-29 NOTE — Progress Notes (Signed)
Subjective:    Patient ID: Frances Gray, female    DOB: 03/11/1951, 69 y.o.   MRN: [id number]  HPI The patient is here for follow up of their chronic medical problems, including htn, DM, hyperlipidemia, multinodular goiter   She has not been eating healthy recently.     She is not exercising regularly.      Medications and allergies reviewed with patient and updated if appropriate.  Patient Active Problem List   Diagnosis Date Noted  . Hepatic steatosis 10/08/2019  . Perforation of sigmoid colon due to diverticulitis 09/24/2019  . Hypokalemia 09/24/2019  . Plantar fasciitis, right 12/05/2016  . Diabetes mellitus without complication ([place]) 30/16/0109  . Nodule of flexor tendon sheath 01/23/2016  . Midline low back pain without sciatica 01/23/2016  . Knee osteoarthritis 12/06/2014  . Hyperlipidemia   . Osteopenia 12/29/2013  . Severe obesity (BMI >= 40) ([place]) 11/16/2012  . 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 Anemia, unspecified 08/14/2011  . Vitamin D deficiency disease 08/14/2011  . Kidney stone 07/03/2011  . GERD (gastroesophageal reflux disease)   . Hypertension     Current Outpatient Medications on File Prior to Visit  Medication Sig Dispense Refill  . calcium carbonate (CALCIUM 600) 600 MG TABS tablet Take 1 tablet (600 mg total) by mouth 2 (two) times daily with a meal. 60 tablet 6  . Cholecalciferol (VITAMIN D3) 2000 UNITS TABS Take 2,000 Units by mouth every morning.     . finasteride (PROSCAR) 5 MG tablet     . Fluocinolone Acetonide Scalp 0.01 % OIL Apply 1 mL topically at bedtime as needed (itchy scalp).     . fluticasone (FLONASE) 50 MCG/ACT nasal spray Place 1 spray into both nostrils daily as needed for allergies or rhinitis.   0  . Multiple Vitamin (MULTIVITAMIN) tablet Take 1 tablet by mouth every morning.     . rosuvastatin (CRESTOR) 5 MG tablet TAKE 1 TABLET TWICE A WEEK (Patient taking differently: Take 5 mg by mouth 2 (two) times a week.  TAKE 1 TABLET TWICE A WEEK Monday, Friday) 24 tablet 1  . sennosides-docusate sodium (SENOKOT-S) 8.6-50 MG tablet Take 1 tablet by mouth at bedtime.     No current facility-administered medications on file prior to visit.    Past Medical History:  Diagnosis Date  . Allergic rhinitis, seasonal    takes Claritin daily as needed and uses Flonase if needed  . Allergy   . 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 Diverticulitis   . GERD (gastroesophageal reflux disease)    doesn't take any meds  . Heart murmur    no symptoms  . History of bronchitis winter of 2014  . History of colon polyps    benign  . History of kidney stones   . Hyperlipidemia   . Hypertension    takes Hyzaar daily  . Vitamin D deficiency disease 08/14/2011   takes Vit D daily    Past Surgical History:  Procedure Laterality Date  . BREAST BIOPSY Left   . Breast ultrasound Right 03/02/13   The 51mm complex cyst in the right breast is probably benign. Follow-up in 6 months with bilateral mammogram and (R) ultrasound  . Breast Ultrasound Right 09/02/13   There is a benign 3 mm complex cyst in the (R) breast at 9 o'clock  . COLONOSCOPY    . ESOPHAGUS SURGERY  1996   tumor removed   (benign)  .  KNEE ARTHROSCOPY     bilateral  . LITHOTRIPSY  08/2011   R ureteral stone  . POLYPECTOMY    . TOTAL KNEE ARTHROPLASTY Left 11/16/2012   Procedure: TOTAL KNEE ARTHROPLASTY- left;  Surgeon: Johnny Bridge, MD;  Location: [facility];  Service: Orthopedics;  Laterality: Left;  . TOTAL KNEE ARTHROPLASTY Right 12/06/2014   Procedure: TOTAL KNEE ARTHROPLASTY;  Surgeon: Marchia Bond, MD;  Location: [facility];  Service: Orthopedics;  Laterality: Right;    Social History   Socioeconomic History  . Marital status: Married    Spouse name: Not on file  . Number of children: Not on file  . Years of education: Not on file  . Highest education level: Not on file  Occupational History  . Occupation: retired  Tobacco Use  .  Smoking status: Former Smoker    Packs/day: 0.25    Years: 10.00    Pack years: 2.50  . Smokeless tobacco: Never Used  . Tobacco comment: quit smoking 36yrs ago  Vaping Use  . Vaping Use: Never used  Substance and Sexual Activity  . Alcohol use: No  . Drug use: No  . Sexual activity: Yes    Birth control/protection: Post-menopausal  Other Topics Concern  . Not on file  Social History Narrative   Lives with spouse and g-son, temporarily mother in law (05/2014)   Social Determinants of Health   Financial Resource Strain:   . Difficulty of Paying Living Expenses:   Food Insecurity:   . Worried About Charity fundraiser in the Last Year:   . Arboriculturist in the Last Year:   Transportation Needs:   . Film/video editor (Medical):   Marland Kitchen Lack of Transportation (Non-Medical):   Physical Activity:   . Days of Exercise per Week:   . Minutes of Exercise per Session:   Stress:   . Feeling of Stress :   Social Connections:   . Frequency of Communication with Friends and Family:   . Frequency of Social Gatherings with Friends and Family:   . Attends Religious Services:   . Active Member of Clubs or Organizations:   . Attends Archivist Meetings:   Marland Kitchen Marital Status:     Family History  Problem Relation Age of Onset  . Ovarian cancer Mother   . Colon cancer Sister 57  . Breast cancer Sister   . Esophageal cancer Neg Hx   . Colon polyps Neg Hx   . Rectal cancer Neg Hx   . Stomach cancer Neg Hx     Review of Systems  Constitutional: Negative for chills and fever.  Respiratory: Negative for cough, shortness of breath and wheezing.   Cardiovascular: Negative for chest pain, palpitations and leg swelling.  Gastrointestinal: Negative for abdominal pain, blood in stool (no black stool), constipation, diarrhea and nausea.  Neurological: Negative for light-headedness and headaches.       Objective:   Vitals:   11/30/19 0936  BP: 140/78  Pulse: 64  Temp: 97.7  F (36.5 C)  SpO2: 98%   BP Readings from Last 3 Encounters:  11/30/19 140/78  10/08/19 (!) 152/82  09/30/19 (!) 146/81   Wt Readings from Last 3 Encounters:  11/30/19 276 lb (125.2 kg)  10/08/19 273 lb (123.8 kg)  09/24/19 279 lb (126.6 kg)   Body mass index is 47.38 kg/m.   Physical Exam    Constitutional: Appears well-developed and well-nourished. No distress.  HENT:  Head: Normocephalic and atraumatic.  Neck: Neck supple. No tracheal deviation present. thyromegaly present.  No cervical lymphadenopathy Cardiovascular: Normal rate, regular rhythm and normal heart sounds.   No murmur heard. No carotid bruit .  No edema Pulmonary/Chest: Effort normal and breath sounds normal. No respiratory distress. No has no wheezes. No rales.  Skin: Skin is warm and dry. Not diaphoretic.  Psychiatric: Normal mood and affect. Behavior is normal.      Assessment & Plan:    See Problem List for Assessment and Plan of chronic medical problems.    This visit occurred during the SARS-CoV-2 public health emergency.  Safety protocols were in place, including screening questions prior to the visit, additional usage of staff PPE, and extensive cleaning of exam room while observing appropriate contact time as indicated for disinfecting solutions.

## 2019-11-29 NOTE — Patient Instructions (Addendum)
  Blood work was ordered.     Medications reviewed and updated.  Changes include :   none  Your prescription(s) have been submitted to your pharmacy. Please take as directed and contact our office if you believe you are having problem(s) with the medication(s).   Please followup in 6 months   

## 2019-11-30 ENCOUNTER — Ambulatory Visit (INDEPENDENT_AMBULATORY_CARE_PROVIDER_SITE_OTHER): Payer: Medicare Other | Admitting: Internal Medicine

## 2019-11-30 ENCOUNTER — Encounter: Payer: Self-pay | Admitting: Internal Medicine

## 2019-11-30 ENCOUNTER — Other Ambulatory Visit: Payer: Self-pay

## 2019-11-30 VITALS — BP 140/78 | HR 64 | Temp 97.7°F | Ht 64.0 in | Wt 276.0 lb

## 2019-11-30 DIAGNOSIS — E119 Type 2 diabetes mellitus without complications: Secondary | ICD-10-CM | POA: Diagnosis not present

## 2019-11-30 DIAGNOSIS — I10 Essential (primary) hypertension: Secondary | ICD-10-CM

## 2019-11-30 DIAGNOSIS — E559 Vitamin D deficiency, unspecified: Secondary | ICD-10-CM | POA: Diagnosis not present

## 2019-11-30 DIAGNOSIS — E7849 Other hyperlipidemia: Secondary | ICD-10-CM

## 2019-11-30 MED ORDER — LOSARTAN POTASSIUM-HCTZ 100-25 MG PO TABS: 1.0000 | ORAL_TABLET | Freq: Every day | ORAL | 1 refills | Status: DC

## 2019-11-30 NOTE — Assessment & Plan Note (Signed)
Chronic Diet controlled Check a1c Low sugar / carb diet Stressed regular exercise Encouraged weight loss

## 2019-11-30 NOTE — Assessment & Plan Note (Addendum)
BP Readings from Last 3 Encounters:  11/30/19 140/78  10/08/19 (!) 152/82  09/30/19 (!) 146/81   Chronic BP controlled today, but high in past - not checking it at home Continue current medications at current doses cmp

## 2019-11-30 NOTE — Assessment & Plan Note (Signed)
Chronic Check lipid panel  Continue daily statin Regular exercise and healthy diet encouraged  

## 2019-11-30 NOTE — Progress Notes (Signed)
Outside notes received. Information abstracted. Notes sent to scan.  

## 2019-11-30 NOTE — Assessment & Plan Note (Signed)
Chronic Stressed regular exercise, decrease portions, healthy diet Discussed the importance of weight loss overall Follow-up in 6 months

## 2019-11-30 NOTE — Assessment & Plan Note (Signed)
Chronic Taking vitamin d daily Check level 

## 2020-01-04 DIAGNOSIS — L659 Nonscarring hair loss, unspecified: Secondary | ICD-10-CM | POA: Diagnosis not present

## 2020-01-04 DIAGNOSIS — L811 Chloasma: Secondary | ICD-10-CM | POA: Diagnosis not present

## 2020-01-07 ENCOUNTER — Other Ambulatory Visit: Payer: Self-pay | Admitting: Internal Medicine

## 2020-01-27 DIAGNOSIS — Z124 Encounter for screening for malignant neoplasm of cervix: Secondary | ICD-10-CM | POA: Diagnosis not present

## 2020-01-27 DIAGNOSIS — Z01419 Encounter for gynecological examination (general) (routine) without abnormal findings: Secondary | ICD-10-CM | POA: Diagnosis not present

## 2020-02-07 DIAGNOSIS — Z8041 Family history of malignant neoplasm of ovary: Secondary | ICD-10-CM | POA: Diagnosis not present

## 2020-02-07 DIAGNOSIS — R9389 Abnormal findings on diagnostic imaging of other specified body structures: Secondary | ICD-10-CM | POA: Diagnosis not present

## 2020-02-07 DIAGNOSIS — N7011 Chronic salpingitis: Secondary | ICD-10-CM | POA: Diagnosis not present

## 2020-04-04 DIAGNOSIS — Z23 Encounter for immunization: Secondary | ICD-10-CM | POA: Diagnosis not present

## 2020-04-24 DIAGNOSIS — L661 Lichen planopilaris: Secondary | ICD-10-CM | POA: Diagnosis not present

## 2020-04-24 DIAGNOSIS — L658 Other specified nonscarring hair loss: Secondary | ICD-10-CM | POA: Diagnosis not present

## 2020-05-22 NOTE — Patient Instructions (Addendum)
    Blood work was ordered.     Medications changes include :  none   Your prescription(s) have been submitted to your pharmacy. Please take as directed and contact our office if you believe you are having problem(s) with the medication(s).   Please followup in 6 months  

## 2020-05-22 NOTE — Progress Notes (Signed)
Subjective:    Patient ID: Frances Gray, female    DOB: 1951/01/16, 69 y.o.   MRN: [id number]  HPI The patient is here for follow up of their chronic medical problems, including DM, htn, hyperlipidemia, multinodular goiter   Her sinuses are draining and she has had some nose bleeds. She usually has to clear herself out in the morning and then is ok the rest of the day.  She denies sinus pain or fever.   Medications and allergies reviewed with patient and updated if appropriate.  Patient Active Problem List   Diagnosis Date Noted  . Hypocalcemia 05/22/2020  . Hepatic steatosis 10/08/2019  . Perforation of sigmoid colon due to diverticulitis 09/24/2019  . Hypokalemia 09/24/2019  . Plantar fasciitis, right 12/05/2016  . Diabetes mellitus without complication ([place]) 32/35/5732  . Nodule of flexor tendon sheath 01/23/2016  . Midline low back pain without sciatica 01/23/2016  . Knee osteoarthritis 12/06/2014  . Hyperlipidemia   . Osteopenia 12/29/2013  . Severe obesity (BMI >= 40) ([place]) 11/16/2012  . 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 Anemia, unspecified 08/14/2011  . Vitamin D deficiency disease 08/14/2011  . Kidney stone 07/03/2011  . GERD (gastroesophageal reflux disease)   . Hypertension     Current Outpatient Medications on File Prior to Visit  Medication Sig Dispense Refill  . calcium carbonate (CALCIUM 600) 600 MG TABS tablet Take 1 tablet (600 mg total) by mouth 2 (two) times daily with a meal. 60 tablet 6  . Cholecalciferol (VITAMIN D3) 2000 UNITS TABS Take 2,000 Units by mouth every morning.     . finasteride (PROSCAR) 5 MG tablet     . Fluocinolone Acetonide Scalp 0.01 % OIL Apply 1 mL topically at bedtime as needed (itchy scalp).     . fluticasone (FLONASE) 50 MCG/ACT nasal spray Place 1 spray into both nostrils daily as needed for allergies or rhinitis.   0  . losartan-hydrochlorothiazide (HYZAAR) 100-25 MG tablet Take 1 tablet by mouth daily. 90 tablet  1  . Multiple Vitamin (MULTIVITAMIN) tablet Take 1 tablet by mouth every morning.     . rosuvastatin (CRESTOR) 5 MG tablet TAKE 1 TABLET TWICE WEEKLY 24 tablet 1  . sennosides-docusate sodium (SENOKOT-S) 8.6-50 MG tablet Take 1 tablet by mouth at bedtime.     No current facility-administered medications on file prior to visit.    Past Medical History:  Diagnosis Date  . Allergic rhinitis, seasonal    takes Claritin daily as needed and uses Flonase if needed  . Allergy   . 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 Diverticulitis   . GERD (gastroesophageal reflux disease)    doesn't take any meds  . Heart murmur    no symptoms  . History of bronchitis winter of 2014  . History of colon polyps    benign  . History of kidney stones   . Hyperlipidemia   . Hypertension    takes Hyzaar daily  . Vitamin D deficiency disease 08/14/2011   takes Vit D daily    Past Surgical History:  Procedure Laterality Date  . BREAST BIOPSY Left   . Breast ultrasound Right 03/02/13   The 52mm complex cyst in the right breast is probably benign. Follow-up in 6 months with bilateral mammogram and (R) ultrasound  . Breast Ultrasound Right 09/02/13   There is a benign 3 mm complex cyst in the (R) breast at 9 o'clock  .  COLONOSCOPY    . ESOPHAGUS SURGERY  1996   tumor removed   (benign)  . KNEE ARTHROSCOPY     bilateral  . LITHOTRIPSY  08/2011   R ureteral stone  . POLYPECTOMY    . TOTAL KNEE ARTHROPLASTY Left 11/16/2012   Procedure: TOTAL KNEE ARTHROPLASTY- left;  Surgeon: Johnny Bridge, MD;  Location: [facility];  Service: Orthopedics;  Laterality: Left;  . TOTAL KNEE ARTHROPLASTY Right 12/06/2014   Procedure: TOTAL KNEE ARTHROPLASTY;  Surgeon: Marchia Bond, MD;  Location: [facility];  Service: Orthopedics;  Laterality: Right;    Social History   Socioeconomic History  . Marital status: Married    Spouse name: Not on file  . Number of children: Not on file  . Years of education: Not on  file  . Highest education level: Not on file  Occupational History  . Occupation: retired  Tobacco Use  . Smoking status: Former Smoker    Packs/day: 0.25    Years: 10.00    Pack years: 2.50  . Smokeless tobacco: Never Used  . Tobacco comment: quit smoking 38yrs ago  Vaping Use  . Vaping Use: Never used  Substance and Sexual Activity  . Alcohol use: No  . Drug use: No  . Sexual activity: Yes    Birth control/protection: Post-menopausal  Other Topics Concern  . Not on file  Social History Narrative   Lives with spouse and g-son, temporarily mother in law (05/2014)   Social Determinants of Health   Financial Resource Strain: Not on file  Food Insecurity: Not on file  Transportation Needs: Not on file  Physical Activity: Not on file  Stress: Not on file  Social Connections: Not on file    Family History  Problem Relation Age of Onset  . Ovarian cancer Mother   . Colon cancer Sister 28  . Breast cancer Sister   . Esophageal cancer Neg Hx   . Colon polyps Neg Hx   . Rectal cancer Neg Hx   . Stomach cancer Neg Hx     Review of Systems  Constitutional: Negative for chills and fever.  HENT: Positive for congestion (in morning only) and postnasal drip.   Respiratory: Negative for cough, shortness of breath and wheezing.   Cardiovascular: Negative for chest pain, palpitations and leg swelling.  Neurological: Negative for dizziness, light-headedness and headaches.       Objective:   Vitals:   05/23/20 1003  BP: 140/72  Pulse: (!) 57  Temp: 98.2 F (36.8 C)  SpO2: 99%   BP Readings from Last 3 Encounters:  05/23/20 140/72  11/30/19 140/78  10/08/19 (!) 152/82   Wt Readings from Last 3 Encounters:  05/23/20 275 lb (124.7 kg)  11/30/19 276 lb (125.2 kg)  10/08/19 273 lb (123.8 kg)   Body mass index is 47.2 kg/m.   Physical Exam    Constitutional: Appears well-developed and well-nourished. No distress.  HENT:  Head: Normocephalic and atraumatic.   Neck: Neck supple. No tracheal deviation present. No thyromegaly present.  No cervical lymphadenopathy Cardiovascular: Normal rate, regular rhythm and normal heart sounds.   No murmur heard. No carotid bruit .  No edema Pulmonary/Chest: Effort normal and breath sounds normal. No respiratory distress. No has no wheezes. No rales.  Skin: Skin is warm and dry. Not diaphoretic.  Psychiatric: Normal mood and affect. Behavior is normal.      Assessment & Plan:    See Problem List for Assessment and Plan of chronic medical  problems.    This visit occurred during the SARS-CoV-2 public health emergency.  Safety protocols were in place, including screening questions prior to the visit, additional usage of staff PPE, and extensive cleaning of exam room while observing appropriate contact time as indicated for disinfecting solutions.

## 2020-05-23 ENCOUNTER — Other Ambulatory Visit: Payer: Self-pay

## 2020-05-23 ENCOUNTER — Ambulatory Visit (INDEPENDENT_AMBULATORY_CARE_PROVIDER_SITE_OTHER): Payer: Medicare Other | Admitting: Internal Medicine

## 2020-05-23 ENCOUNTER — Encounter: Payer: Self-pay | Admitting: Internal Medicine

## 2020-05-23 VITALS — BP 140/72 | HR 57 | Temp 98.2°F | Ht 64.0 in | Wt 275.0 lb

## 2020-05-23 DIAGNOSIS — E042 Nontoxic multinodular goiter: Secondary | ICD-10-CM | POA: Diagnosis not present

## 2020-05-23 DIAGNOSIS — E213 Hyperparathyroidism, unspecified: Secondary | ICD-10-CM | POA: Insufficient documentation

## 2020-05-23 DIAGNOSIS — E119 Type 2 diabetes mellitus without complications: Secondary | ICD-10-CM

## 2020-05-23 DIAGNOSIS — Z6841 Body Mass Index (BMI) 40.0 and over, adult: Secondary | ICD-10-CM | POA: Diagnosis not present

## 2020-05-23 DIAGNOSIS — D649 Anemia, unspecified: Secondary | ICD-10-CM | POA: Diagnosis not present

## 2020-05-23 DIAGNOSIS — E7849 Other hyperlipidemia: Secondary | ICD-10-CM | POA: Diagnosis not present

## 2020-05-23 DIAGNOSIS — I10 Essential (primary) hypertension: Secondary | ICD-10-CM

## 2020-05-23 LAB — COMPREHENSIVE METABOLIC PANEL
ALT: 17 U/L (ref 0–35)
AST: 17 U/L (ref 0–37)
Albumin: 3.7 g/dL (ref 3.5–5.2)
Alkaline Phosphatase: 64 U/L (ref 39–117)
BUN: 10 mg/dL (ref 6–23)
CO2: 31 mEq/L (ref 19–32)
Calcium: 10.1 mg/dL (ref 8.4–10.5)
Chloride: 102 mEq/L (ref 96–112)
Creatinine, Ser: 0.73 mg/dL (ref 0.40–1.20)
GFR: 83.29 mL/min (ref >60.00)
Glucose, Bld: 99 mg/dL (ref 70–99)
Potassium: 3.9 mEq/L (ref 3.5–5.1)
Sodium: 139 mEq/L (ref 135–145)
Total Bilirubin: 0.4 mg/dL (ref 0.2–1.2)
Total Protein: 7.8 g/dL (ref 6.0–8.3)

## 2020-05-23 LAB — HEMOGLOBIN A1C: Hgb A1c MFr Bld: 6.6 % — ABNORMAL HIGH (ref 4.6–6.5)

## 2020-05-23 LAB — CBC WITH DIFFERENTIAL/PLATELET
Basophils Absolute: 0 10*3/uL (ref 0.0–0.1)
Basophils Relative: 0.5 % (ref 0.0–3.0)
Eosinophils Absolute: 0.3 10*3/uL (ref 0.0–0.7)
Eosinophils Relative: 5.8 % — ABNORMAL HIGH (ref 0.0–5.0)
HCT: 39.3 % (ref 36.0–46.0)
Hemoglobin: 13.1 g/dL (ref 12.0–15.0)
Lymphocytes Relative: 28.1 % (ref 12.0–46.0)
Lymphs Abs: 1.3 10*3/uL (ref 0.7–4.0)
MCHC: 33.5 g/dL (ref 30.0–36.0)
MCV: 84.6 fl (ref 78.0–100.0)
Monocytes Absolute: 0.8 10*3/uL (ref 0.1–1.0)
Monocytes Relative: 16.7 % — ABNORMAL HIGH (ref 3.0–12.0)
Neutro Abs: 2.3 10*3/uL (ref 1.4–7.7)
Neutrophils Relative %: 48.9 % (ref 43.0–77.0)
Platelets: 342 10*3/uL (ref 150.0–400.0)
RBC: 4.64 Mil/uL (ref 3.87–5.11)
RDW: 13.4 % (ref 11.5–15.5)
WBC: 4.8 10*3/uL (ref 4.0–10.5)

## 2020-05-23 LAB — LIPID PANEL
Cholesterol: 161 mg/dL (ref 0–200)
HDL: 58.5 mg/dL (ref >39.00)
LDL Cholesterol: 84 mg/dL (ref 0–99)
NonHDL: 102.16
Total CHOL/HDL Ratio: 3
Triglycerides: 89 mg/dL (ref 0.0–149.0)
VLDL: 17.8 mg/dL (ref 0.0–40.0)

## 2020-05-23 LAB — VITAMIN D 25 HYDROXY (VIT D DEFICIENCY, FRACTURES): VITD: 43.35 ng/mL (ref 30.00–100.00)

## 2020-05-23 LAB — TSH: TSH: 3.7 u[IU]/mL (ref 0.35–4.50)

## 2020-05-23 MED ORDER — LOSARTAN POTASSIUM-HCTZ 100-25 MG PO TABS: 1.0000 | ORAL_TABLET | Freq: Every day | ORAL | 1 refills | Status: DC

## 2020-05-23 MED ORDER — ROSUVASTATIN CALCIUM 5 MG PO TABS: ORAL_TABLET | ORAL | 1 refills | Status: DC

## 2020-05-23 NOTE — Assessment & Plan Note (Addendum)
Chronic BMI 47.2 kg/m Encouraged regular exercise, diabetic diet, decrease portions

## 2020-05-23 NOTE — Assessment & Plan Note (Signed)
Chronic Diet controlled Check A1c Encouraged low sugar/carbohydrate diet, regular exercise and weight loss

## 2020-05-23 NOTE — Assessment & Plan Note (Signed)
Chronic Check lipid panel, CMP Continue rosuvastatin 5 mg twice weekly Encouraged regular exercise and weight loss

## 2020-05-23 NOTE — Assessment & Plan Note (Signed)
Chronic History of anemia Check CBC

## 2020-05-23 NOTE — Assessment & Plan Note (Signed)
Chronic Stable by ultrasound-no follow-up needed by ultrasound Clinically euthyroid Check TSH

## 2020-05-23 NOTE — Assessment & Plan Note (Signed)
Chronic Low calcium History of hyperparathyroidism Check CMP, vitamin D

## 2020-05-23 NOTE — Assessment & Plan Note (Signed)
Chronic Controlled Continue losartan-hydrochlorothiazide 100-25 mg daily CMP, CBC

## 2020-06-08 DIAGNOSIS — E119 Type 2 diabetes mellitus without complications: Secondary | ICD-10-CM | POA: Diagnosis not present

## 2020-06-08 DIAGNOSIS — H259 Unspecified age-related cataract: Secondary | ICD-10-CM | POA: Diagnosis not present

## 2020-06-08 DIAGNOSIS — Z135 Encounter for screening for eye and ear disorders: Secondary | ICD-10-CM | POA: Diagnosis not present

## 2020-06-08 DIAGNOSIS — H5203 Hypermetropia, bilateral: Secondary | ICD-10-CM | POA: Diagnosis not present

## 2020-06-08 LAB — HM DIABETES EYE EXAM

## 2020-08-15 ENCOUNTER — Other Ambulatory Visit: Payer: Self-pay

## 2020-08-15 ENCOUNTER — Ambulatory Visit (INDEPENDENT_AMBULATORY_CARE_PROVIDER_SITE_OTHER): Payer: Medicare Other

## 2020-08-15 VITALS — BP 130/80 | HR 55 | Temp 97.8°F | Ht 64.0 in | Wt 272.4 lb

## 2020-08-15 DIAGNOSIS — Z0000 Encounter for general adult medical examination without abnormal findings: Secondary | ICD-10-CM | POA: Diagnosis not present

## 2020-08-15 NOTE — Patient Instructions (Signed)
Ms. Schneeberger , Thank you for taking time to come for your Medicare Wellness Visit. I appreciate your ongoing commitment to your health goals. Please review the following plan we discussed and let me know if I can assist you in the future.   Screening recommendations/referrals: Colonoscopy: 11/24/2018; due every 5 years Mammogram: 11/25/2019; due every year Bone Density: 06/24/2016; due every 3 years Recommended yearly ophthalmology/optometry visit for glaucoma screening and checkup Recommended yearly dental visit for hygiene and checkup  Vaccinations: Influenza vaccine: 03/29/2020 Pneumococcal vaccine: 05/15/2015, 11/19/2016 Tdap vaccine: 10/22/2010; due every 10 years  Shingles vaccine: 12/03/2018, 02/10/2019   Covid-19: 07/25/2019, 08/22/2019, 03/29/2020  Advanced directives: Advance directive discussed with you today. Even though you declined this today please call our office should you change your mind and we can give you the proper paperwork for you to fill out.  Conditions/risks identified: Yes; Reviewed health maintenance screenings with patient today and relevant education, vaccines, and/or referrals were provided. Please continue to do your personal lifestyle choices by: daily care of teeth and gums, regular physical activity (goal should be 5 days a week for 30 minutes), eat a healthy diet, avoid tobacco and drug use, limiting any alcohol intake, taking a low-dose aspirin (if not allergic or have been advised by your provider otherwise) and taking vitamins and minerals as recommended by your provider. Continue doing brain stimulating activities (puzzles, reading, adult coloring books, staying active) to keep memory sharp. Continue to eat heart healthy diet (full of fruits, vegetables, whole grains, lean protein, water--limit salt, fat, and sugar intake) and increase physical activity as tolerated.  Next appointment:Please schedule your next Medicare Wellness Visit with your Nurse Health Advisor in 1  year by calling [phone].    Preventive Care 62 Years and Older, Female Preventive care refers to lifestyle choices and visits with your health care provider that can promote health and wellness. What does preventive care include?  A yearly physical exam. This is also called an annual well check.  Dental exams once or twice a year.  Routine eye exams. Ask your health care provider how often you should have your eyes checked.  Personal lifestyle choices, including:  Daily care of your teeth and gums.  Regular physical activity.  Eating a healthy diet.  Avoiding tobacco and drug use.  Limiting alcohol use.  Practicing safe sex.  Taking low-dose aspirin every day.  Taking vitamin and mineral supplements as recommended by your health care provider. What happens during an annual well check? The services and screenings done by your health care provider during your annual well check will depend on your age, overall health, lifestyle risk factors, and family history of disease. Counseling  Your health care provider may ask you questions about your:  Alcohol use.  Tobacco use.  Drug use.  Emotional well-being.  Home and relationship well-being.  Sexual activity.  Eating habits.  History of falls.  Memory and ability to understand (cognition).  Work and work Statistician.  Reproductive health. Screening  You may have the following tests or measurements:  Height, weight, and BMI.  Blood pressure.  Lipid and cholesterol levels. These may be checked every 5 years, or more frequently if you are over 8 years old.  Skin check.  Lung cancer screening. You may have this screening every year starting at age 50 if you have a 30-pack-year history of smoking and currently smoke or have quit within the past 15 years.  Fecal occult blood test (FOBT) of the stool. You may  have this test every year starting at age 42.  Flexible sigmoidoscopy or colonoscopy. You may  have a sigmoidoscopy every 5 years or a colonoscopy every 10 years starting at age 54.  Hepatitis C blood test.  Hepatitis B blood test.  Sexually transmitted disease (STD) testing.  Diabetes screening. This is done by checking your blood sugar (glucose) after you have not eaten for a while (fasting). You may have this done every 1-3 years.  Bone density scan. This is done to screen for osteoporosis. You may have this done starting at age 47.  Mammogram. This may be done every 1-2 years. Talk to your health care provider about how often you should have regular mammograms. Talk with your health care provider about your test results, treatment options, and if necessary, the need for more tests. Vaccines  Your health care provider may recommend certain vaccines, such as:  Influenza vaccine. This is recommended every year.  Tetanus, diphtheria, and acellular pertussis (Tdap, Td) vaccine. You may need a Td booster every 10 years.  Zoster vaccine. You may need this after age 38.  Pneumococcal 13-valent conjugate (PCV13) vaccine. One dose is recommended after age 61.  Pneumococcal polysaccharide (PPSV23) vaccine. One dose is recommended after age 53. Talk to your health care provider about which screenings and vaccines you need and how often you need them. This information is not intended to replace advice given to you by your health care provider. Make sure you discuss any questions you have with your health care provider. Document Released: 06/23/2015 Document Revised: 02/14/2016 Document Reviewed: 03/28/2015 Elsevier Interactive Patient Education  2017 [employer] Prevention in the Home Falls can cause injuries. They can happen to people of all ages. There are many things you can do to make your home safe and to help prevent falls. What can I do on the outside of my home?  Regularly fix the edges of walkways and driveways and fix any cracks.  Remove anything that might make  you trip as you walk through a door, such as a raised step or threshold.  Trim any bushes or trees on the path to your home.  Use bright outdoor lighting.  Clear any walking paths of anything that might make someone trip, such as rocks or tools.  Regularly check to see if handrails are loose or broken. Make sure that both sides of any steps have handrails.  Any raised decks and porches should have guardrails on the edges.  Have any leaves, snow, or ice cleared regularly.  Use sand or salt on walking paths during winter.  Clean up any spills in your garage right away. This includes oil or grease spills. What can I do in the bathroom?  Use night lights.  Install grab bars by the toilet and in the tub and shower. Do not use towel bars as grab bars.  Use non-skid mats or decals in the tub or shower.  If you need to sit down in the shower, use a plastic, non-slip stool.  Keep the floor dry. Clean up any water that spills on the floor as soon as it happens.  Remove soap buildup in the tub or shower regularly.  Attach bath mats securely with double-sided non-slip rug tape.  Do not have throw rugs and other things on the floor that can make you trip. What can I do in the bedroom?  Use night lights.  Make sure that you have a light by your bed that is easy  to reach.  Do not use any sheets or blankets that are too big for your bed. They should not hang down onto the floor.  Have a firm chair that has side arms. You can use this for support while you get dressed.  Do not have throw rugs and other things on the floor that can make you trip. What can I do in the kitchen?  Clean up any spills right away.  Avoid walking on wet floors.  Keep items that you use a lot in easy-to-reach places.  If you need to reach something above you, use a strong step stool that has a grab bar.  Keep electrical cords out of the way.  Do not use floor polish or wax that makes floors slippery.  If you must use wax, use non-skid floor wax.  Do not have throw rugs and other things on the floor that can make you trip. What can I do with my stairs?  Do not leave any items on the stairs.  Make sure that there are handrails on both sides of the stairs and use them. Fix handrails that are broken or loose. Make sure that handrails are as long as the stairways.  Check any carpeting to make sure that it is firmly attached to the stairs. Fix any carpet that is loose or worn.  Avoid having throw rugs at the top or bottom of the stairs. If you do have throw rugs, attach them to the floor with carpet tape.  Make sure that you have a light switch at the top of the stairs and the bottom of the stairs. If you do not have them, ask someone to add them for you. What else can I do to help prevent falls?  Wear shoes that:  Do not have high heels.  Have rubber bottoms.  Are comfortable and fit you well.  Are closed at the toe. Do not wear sandals.  If you use a stepladder:  Make sure that it is fully opened. Do not climb a closed stepladder.  Make sure that both sides of the stepladder are locked into place.  Ask someone to hold it for you, if possible.  Clearly mark and make sure that you can see:  Any grab bars or handrails.  First and last steps.  Where the edge of each step is.  Use tools that help you move around (mobility aids) if they are needed. These include:  Canes.  Walkers.  Scooters.  Crutches.  Turn on the lights when you go into a dark area. Replace any light bulbs as soon as they burn out.  Set up your furniture so you have a clear path. Avoid moving your furniture around.  If any of your floors are uneven, fix them.  If there are any pets around you, be aware of where they are.  Review your medicines with your doctor. Some medicines can make you feel dizzy. This can increase your chance of falling. Ask your doctor what other things that you can do to  help prevent falls. This information is not intended to replace advice given to you by your health care provider. Make sure you discuss any questions you have with your health care provider. Document Released: 03/23/2009 Document Revised: 11/02/2015 Document Reviewed: 07/01/2014 Elsevier Interactive Patient Education  2017 [employer].

## 2020-08-15 NOTE — Progress Notes (Signed)
Subjective:   Frances Gray is a 70 y.o. female who presents for Medicare Annual (Subsequent) preventive examination.  Review of Systems    No ROS. Medicare Wellness Visit. Additional risk factors are reflected in social history. Cardiac Risk Factors include: advanced age (>49men, >73 women);diabetes mellitus;dyslipidemia;hypertension;obesity (BMI >30kg/m2)     Objective:    Today's Vitals   08/15/20 0937  BP: 130/80  Pulse: (!) 55  Temp: 97.8 F (36.6 C)  SpO2: 98%  Weight: 272 lb 6.4 oz (123.6 kg)  Height: 5\' 4"  (1.626 m)  PainSc: 5    Body mass index is 46.76 kg/m.  Advanced Directives 08/15/2020 09/24/2019 05/26/2018 03/01/2018 05/21/2017 11/24/2014 05/12/2014  Does Patient Have a Medical Advance Directive? No No No No No No No  Would patient like information on creating a medical advance directive? No - Patient declined No - Patient declined No - Patient declined - Yes (ED - Information included in AVS) Yes - Educational materials given Yes - Educational materials given  Pre-existing out of facility DNR order (yellow form or pink MOST form) - - - - - - -    Current Medications (verified) Outpatient Encounter Medications as of 08/15/2020  Medication Sig  . calcium carbonate (CALCIUM 600) 600 MG TABS tablet Take 1 tablet (600 mg total) by mouth 2 (two) times daily with a meal.  . Cholecalciferol (VITAMIN D3) 2000 UNITS TABS Take 2,000 Units by mouth every morning.   . finasteride (PROSCAR) 5 MG tablet   . Fluocinolone Acetonide Scalp 0.01 % OIL Apply 1 mL topically at bedtime as needed (itchy scalp).   . fluticasone (FLONASE) 50 MCG/ACT nasal spray Place 1 spray into both nostrils daily as needed for allergies or rhinitis.   Marland Kitchen losartan-hydrochlorothiazide (HYZAAR) 100-25 MG tablet Take 1 tablet by mouth daily.  . Multiple Vitamin (MULTIVITAMIN) tablet Take 1 tablet by mouth every morning.   . rosuvastatin (CRESTOR) 5 MG tablet TAKE 1 TABLET TWICE WEEKLY  .  sennosides-docusate sodium (SENOKOT-S) 8.6-50 MG tablet Take 1 tablet by mouth at bedtime.   No facility-administered encounter medications on file as of 08/15/2020.    Allergies (verified) Amlodipine, Metformin and related, and Pravastatin   History: Past Medical History:  Diagnosis Date  . Allergic rhinitis, seasonal    takes Claritin daily as needed and uses Flonase if needed  . Allergy   . 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 Diverticulitis   . GERD (gastroesophageal reflux disease)    doesn't take any meds  . Heart murmur    no symptoms  . History of bronchitis winter of 2014  . History of colon polyps    benign  . History of kidney stones   . Hyperlipidemia   . Hypertension    takes Hyzaar daily  . Vitamin D deficiency disease 08/14/2011   takes Vit D daily   Past Surgical History:  Procedure Laterality Date  . BREAST BIOPSY Left   . Breast ultrasound Right 03/02/13   The 97mm complex cyst in the right breast is probably benign. Follow-up in 6 months with bilateral mammogram and (R) ultrasound  . Breast Ultrasound Right 09/02/13   There is a benign 3 mm complex cyst in the (R) breast at 9 o'clock  . COLONOSCOPY    . ESOPHAGUS SURGERY  1996   tumor removed   (benign)  . KNEE ARTHROSCOPY     bilateral  . LITHOTRIPSY  08/2011  R ureteral stone  . POLYPECTOMY    . TOTAL KNEE ARTHROPLASTY Left 11/16/2012   Procedure: TOTAL KNEE ARTHROPLASTY- left;  Surgeon: Johnny Bridge, MD;  Location: [facility];  Service: Orthopedics;  Laterality: Left;  . TOTAL KNEE ARTHROPLASTY Right 12/06/2014   Procedure: TOTAL KNEE ARTHROPLASTY;  Surgeon: Marchia Bond, MD;  Location: [facility];  Service: Orthopedics;  Laterality: Right;   Family History  Problem Relation Age of Onset  . Ovarian cancer Mother   . Colon cancer Sister 2  . Breast cancer Sister   . Esophageal cancer Neg Hx   . Colon polyps Neg Hx   . Rectal cancer Neg Hx   . Stomach cancer Neg Hx    Social  History   Socioeconomic History  . Marital status: Married    Spouse name: Not on file  . Number of children: Not on file  . Years of education: Not on file  . Highest education level: Not on file  Occupational History  . Occupation: retired  Tobacco Use  . Smoking status: Former Smoker    Packs/day: 0.25    Years: 10.00    Pack years: 2.50  . Smokeless tobacco: Never Used  . Tobacco comment: quit smoking 40yrs ago  Vaping Use  . Vaping Use: Never used  Substance and Sexual Activity  . Alcohol use: No  . Drug use: No  . Sexual activity: Yes    Birth control/protection: Post-menopausal  Other Topics Concern  . Not on file  Social History Narrative   Lives with spouse and g-son, temporarily mother in law (05/2014)   Social Determinants of Health   Financial Resource Strain: Low Risk   . Difficulty of Paying Living Expenses: Not hard at all  Food Insecurity: No Food Insecurity  . Worried About Charity fundraiser in the Last Year: Never true  . Ran Out of Food in the Last Year: Never true  Transportation Needs: No Transportation Needs  . Lack of Transportation (Medical): No  . Lack of Transportation (Non-Medical): No  Physical Activity: Inactive  . Days of Exercise per Week: 0 days  . Minutes of Exercise per Session: 0 min  Stress: No Stress Concern Present  . Feeling of Stress : Not at all  Social Connections: Socially Integrated  . Frequency of Communication with Friends and Family: More than three times a week  . Frequency of Social Gatherings with Friends and Family: More than three times a week  . Attends Religious Services: More than 4 times per year  . Active Member of Clubs or Organizations: Yes  . Attends Archivist Meetings: More than 4 times per year  . Marital Status: Married    Tobacco Counseling Counseling given: Not Answered Comment: quit smoking 74yrs ago   Clinical Intake:  Pre-visit preparation completed: Yes  Pain : No/denies  pain Pain Score: 5      BMI - recorded: 46.76 Nutritional Status: BMI > 30  Obese Nutritional Risks: None Diabetes: Yes CBG done?: No Did pt. bring in CBG monitor from home?: No  How often do you need to have someone help you when you read instructions, pamphlets, or other written materials from your doctor or pharmacy?: 1 - Never What is the last grade level you completed in school?: High School Graduate; some college courses  Diabetic? yes  Interpreter Needed?: No  Information entered by :: Lisette Abu, LPN   Activities of Daily Living In your present state of health, do you  have any difficulty performing the following activities: 08/15/2020 09/29/2019  Hearing? N -  Vision? N -  Difficulty concentrating or making decisions? N -  Walking or climbing stairs? N -  Dressing or bathing? N -  Doing errands, shopping? N Y  Conservation officer, nature and eating ? N -  Using the Toilet? N -  In the past six months, have you accidently leaked urine? N -  Do you have problems with loss of bowel control? N -  Managing your Medications? N -  Managing your Finances? N -  Housekeeping or managing your Housekeeping? N -  Some recent data might be hidden    Patient Care Team: Binnie Rail, MD as PCP - General (Internal Medicine) Festus Aloe, MD (Urology) Marchia Bond, MD (Orthopedic Surgery) Armandina Gemma, MD (General Surgery) Katy Apo, MD (Ophthalmology) Princess Bruins, MD (Obstetrics and Gynecology) Inda Castle, MD (Inactive) (Gastroenterology)  Indicate any recent Medical Services you may have received from other than [facility] providers in the past year (date may be approximate).     Assessment:   This is a routine wellness examination for Frances Gray.  Hearing/Vision screen No exam data present  Dietary issues and exercise activities discussed: Current Exercise Habits: The patient does not participate in regular exercise at present, Exercise limited by:  orthopedic condition(s) (bilateral knee replacements)  Goals    . Patient Stated     Lose weight by continuing to monitor my diet for sugar and carbohydrates and increase my physical activity.     . Patient Stated     Want to increase my physical activity by starting to walk with my husband and start to do chair exercises.       Depression Screen PHQ 2/9 Scores 08/15/2020 11/30/2019 11/25/2018 05/26/2018 05/21/2017 05/20/2016 05/15/2015  PHQ - 2 Score 0 0 0 0 1 0 1  PHQ- 9 Score - - - - 2 - -    Fall Risk Fall Risk  08/15/2020 11/30/2019 11/25/2018 05/26/2018 04/29/2018  Falls in the past year? 1 0 0 0 0  Comment - - - - Emmi Telephone Survey: data to providers prior to load  Number falls in past yr: 0 0 0 - -  Injury with Fall? 0 0 - - -  Risk for fall due to : No Fall Risks No Fall Risks - - -  Follow up Falls evaluation completed - - - -    FALL RISK PREVENTION PERTAINING TO THE HOME:  Any stairs in or around the home? No  If so, are there any without handrails? No  Home free of loose throw rugs in walkways, pet beds, electrical cords, etc? Yes  Adequate lighting in your home to reduce risk of falls? Yes   ASSISTIVE DEVICES UTILIZED TO PREVENT FALLS:  Life alert? No  Use of a cane, walker or w/c? No  Grab bars in the bathroom? Yes  Shower chair or bench in shower? No  Elevated toilet seat or a handicapped toilet? Yes   TIMED UP AND GO:  Was the test performed? No .  Length of time to ambulate 10 feet: 0 sec.   Gait steady and fast without use of assistive device  Cognitive Function: Normal cognitive status assessed by direct observation by this Nurse Health Advisor. No abnormalities found.          Immunizations Immunization History  Administered Date(s) Administered  . Influenza, High Dose Seasonal PF 05/15/2015, 05/20/2016, 02/27/2017, 05/26/2018, 03/17/2019, 03/29/2020  . Influenza, Seasonal,  Injecte, Preservative Fre 08/12/2012  . Influenza,inj,Quad PF,6+ Mos  02/11/2013, 05/12/2014  . Moderna Sars-Covid-2 Vaccination 07/25/2019, 08/22/2019, 03/29/2020  . Pneumococcal Conjugate-13 05/15/2015  . Pneumococcal Polysaccharide-23 06/10/2010, 11/19/2016  . Zoster 10/09/2010  . Zoster Recombinat (Shingrix) 12/03/2018, 02/10/2019    TDAP status: Up to date  Flu Vaccine status: Up to date  Pneumococcal vaccine status: Up to date  Covid-19 vaccine status: Completed vaccines  Qualifies for Shingles Vaccine? Yes   Zostavax completed Yes   Shingrix Completed?: Yes  Screening Tests Health Maintenance  Topic Date Due  . OPHTHALMOLOGY EXAM  01/04/2020  . DEXA SCAN  11/29/2020 (Originally 06/25/2019)  . FOOT EXAM  10/07/2020  . TETANUS/TDAP  10/21/2020  . HEMOGLOBIN A1C  11/21/2020  . MAMMOGRAM  11/24/2021  . COLONOSCOPY (Pts 45-69yrs Insurance coverage will need to be confirmed)  11/24/2023  . INFLUENZA VACCINE  Completed  . COVID-19 Vaccine  Completed  . Hepatitis C Screening  Completed  . PNA vac Low Risk Adult  Completed  . HPV VACCINES  Aged Out    Health Maintenance  Health Maintenance Due  Topic Date Due  . OPHTHALMOLOGY EXAM  01/04/2020    Colorectal cancer screening: Type of screening: Colonoscopy. Completed 11/24/2018. Repeat every 5 years  Mammogram status: Completed 11/25/2019. Repeat every year  Bone Density status: Completed 06/24/2016. Results reflect: Bone density results: OSTEOPENIA. Repeat every 3 years.  Lung Cancer Screening: (Low Dose CT Chest recommended if Age 32-80 years, 30 pack-year currently smoking OR have quit w/in 15years.) does not qualify.   Lung Cancer Screening Referral: no  Additional Screening:  Hepatitis C Screening: does qualify; Completed yes  Vision Screening: Recommended annual ophthalmology exams for early detection of glaucoma and other disorders of the eye. Is the patient up to date with their annual eye exam?  Yes  Who is the provider or what is the name of the office in which the patient  attends annual eye exams? [facility] in [place] If pt is not established with a provider, would they like to be referred to a provider to establish care? No .   Dental Screening: Recommended annual dental exams for proper oral hygiene  Community Resource Referral / Chronic Care Management: CRR required this visit?  No   CCM required this visit?  No      Plan:     I have personally reviewed and noted the following in the patient's chart:   . Medical and social history . Use of alcohol, tobacco or illicit drugs  . Current medications and supplements . Functional ability and status . Nutritional status . Physical activity . Advanced directives . List of other physicians . Hospitalizations, surgeries, and ER visits in previous 12 months . Vitals . Screenings to include cognitive, depression, and falls . Referrals and appointments  In addition, I have reviewed and discussed with patient certain preventive protocols, quality metrics, and best practice recommendations. A written personalized care plan for preventive services as well as general preventive health recommendations were provided to patient.     Sheral Flow, LPN   06/10/5724   Nurse Notes:  Medications reviewed with patient; no opioid use noted.

## 2020-10-12 DIAGNOSIS — Z23 Encounter for immunization: Secondary | ICD-10-CM | POA: Diagnosis not present

## 2020-10-23 ENCOUNTER — Telehealth: Payer: Self-pay | Admitting: Internal Medicine

## 2020-10-23 NOTE — Progress Notes (Signed)
  Chronic Care Management   Note  10/23/2020 Name: Frances Gray MRN: [id number] DOB: 1951-04-19  Frances Gray is a 70 y.o. year old female who is a primary care patient of Burns, Claudina Lick, MD. I reached out to Tamela Oddi by phone today in response to a referral sent by Frances Gray's PCP, Binnie Rail, MD.   Frances Gray was given information about Chronic Care Management services today including:  1. CCM service includes personalized support from designated clinical staff supervised by her physician, including individualized plan of care and coordination with other care providers 2. 24/7 contact phone numbers for assistance for urgent and routine care needs. 3. Service will only be billed when office clinical staff spend 20 minutes or more in a month to coordinate care. 4. Only one practitioner may furnish and bill the service in a calendar month. 5. The patient may stop CCM services at any time (effective at the end of the month) by phone call to the office staff.   Patient agreed to services and verbal consent obtained.   Follow up plan:   Carley Perdue [employer] Scheduler

## 2020-10-28 IMAGING — US US THYROID
1 series · 13 of 25 positions shown · non-contrast
Comparison: 05/27/2016

CLINICAL DATA: Goiter, follow-up . Previous FNA biopsy of right
nodule and isthmic nodule 01/01/2012.

EXAM:
THYROID ULTRASOUND
TECHNIQUE: Ultrasound examination of the thyroid gland and adjacent soft
tissues was performed.

[Series 1: us thyroid · 0.08mm/px · 13 of 43 slices shown]
[im 1/43]
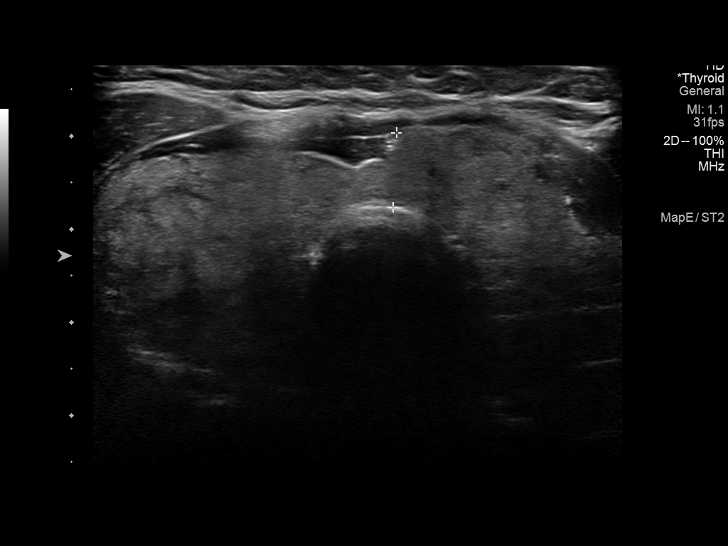
[im 4/43]
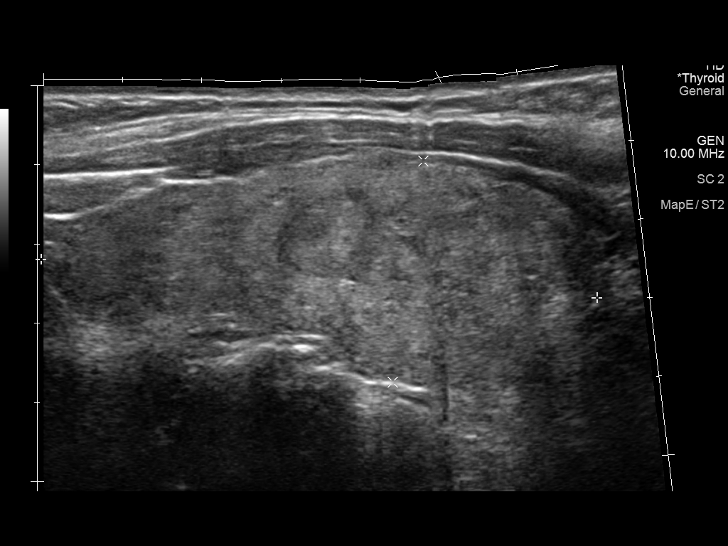
[im 8/43]
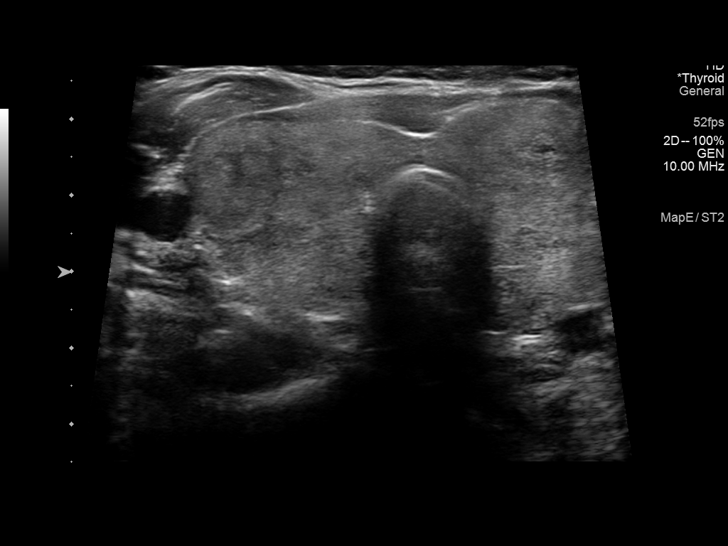
[im 11/43]
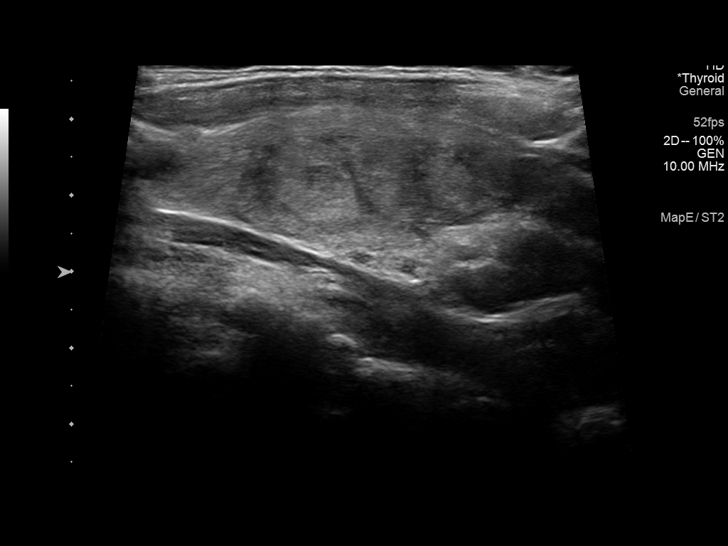
[im 15/43]
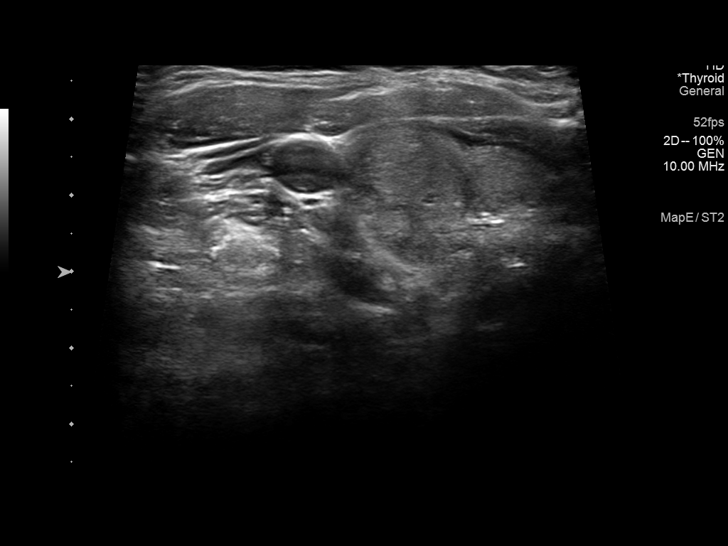
[im 18/43]
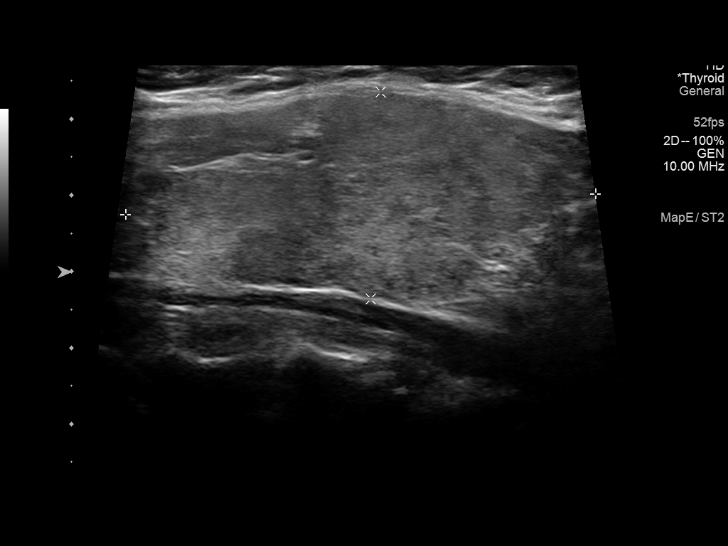
[im 22/43]
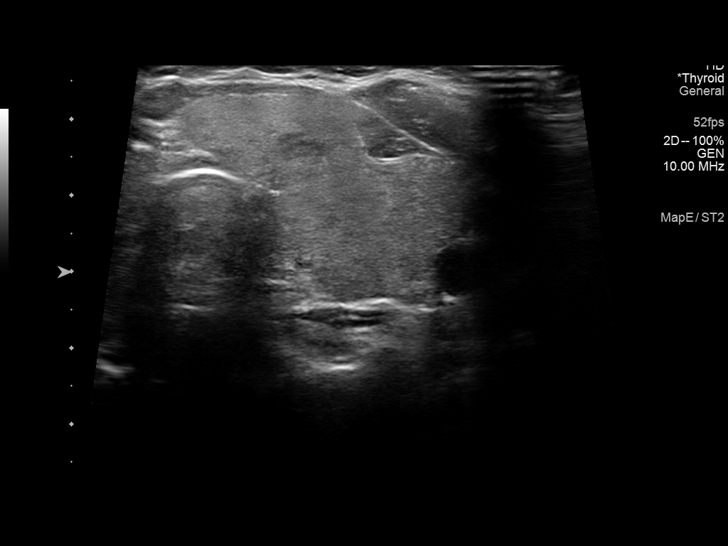
[im 25/43]
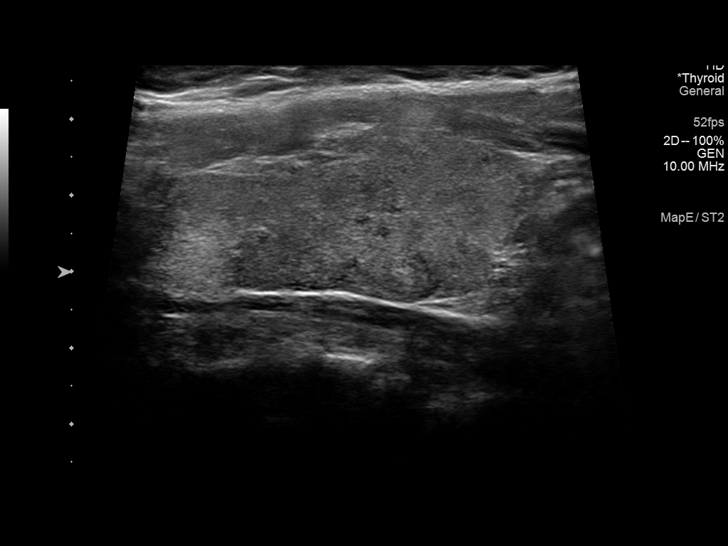
[im 29/43]
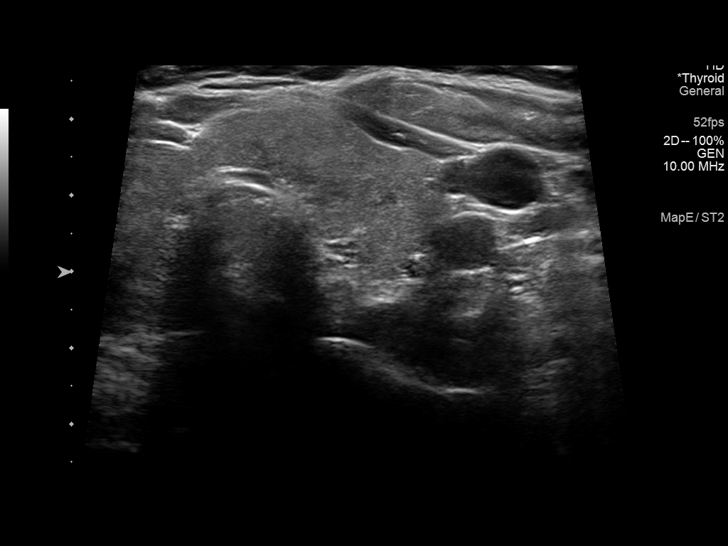
[im 32/43]
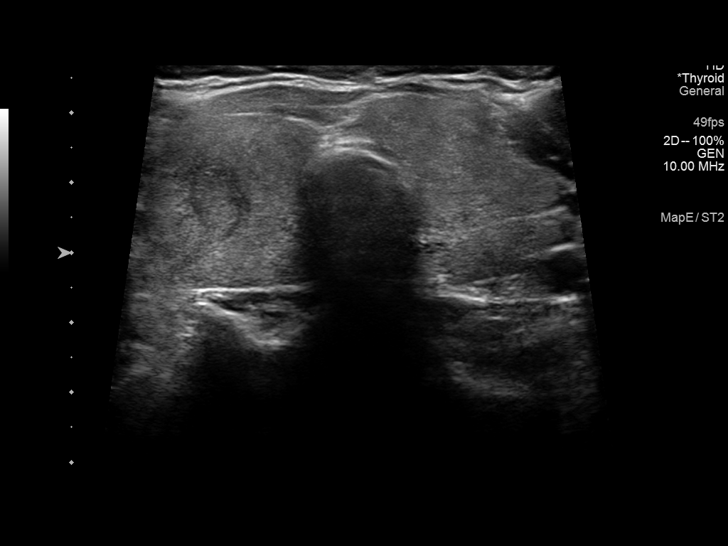
[im 36/43]
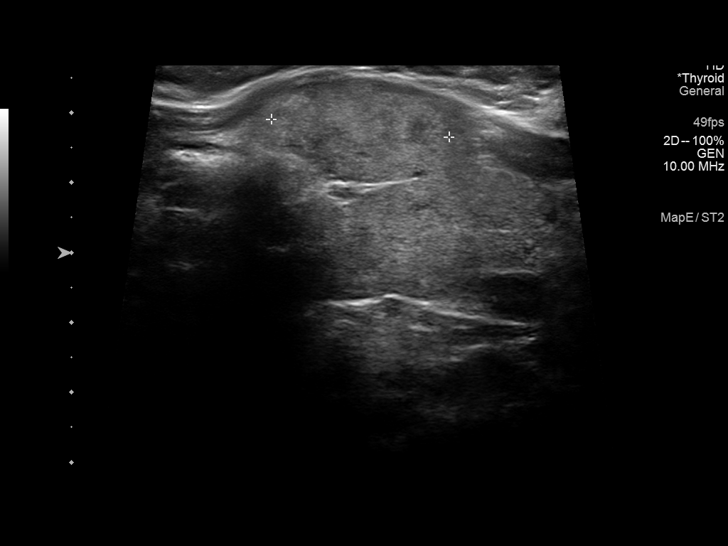
[im 39/43]
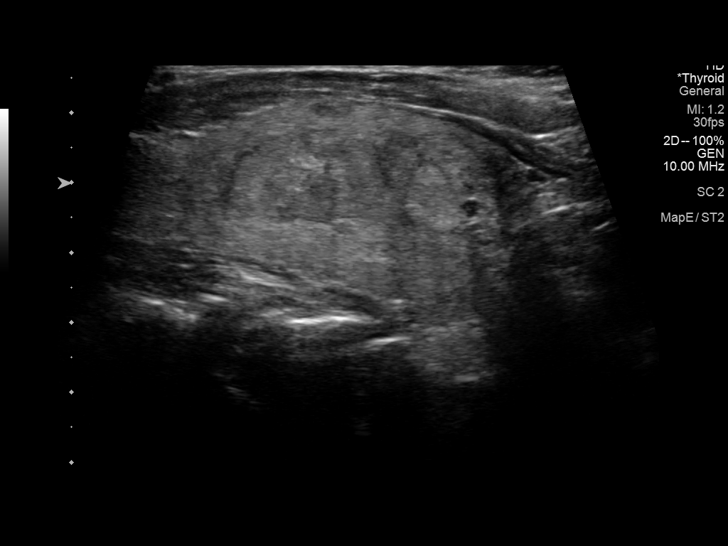
[im 43/43]
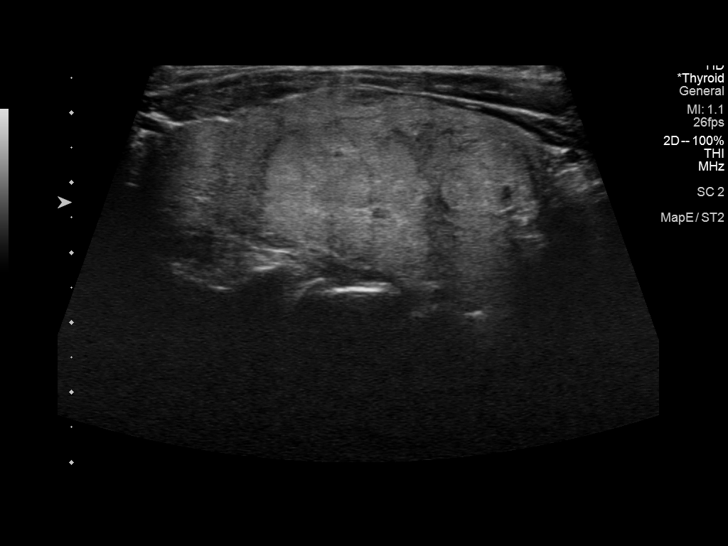

[13 of 25 positions shown; findings below may reference images not displayed]

FINDINGS: Parenchymal Echotexture: Moderately heterogenous

Isthmus: 0.8 cm thickness

Right lobe: 7 x 2.8 x 2.4 cm, previously 7.2 x 2.9 x

Left lobe: 6.2 x 2.7 x 2.6 cm, previously 7.2 x 2.5 x

_________________________________________________________

Estimated total number of nodules >/= 1 cm: 2

Number of spongiform nodules >/=  2 cm not described below (TR1): 0

Number of mixed cystic and solid nodules >/= 1.5 cm not described
below (TR2): 0

_________________________________________________________

3.8 x 1.9 x 1.2 cm inferior right nodule, previously 3.5 x 2.6 x
1.4; this was previously biopsied

2.6 x 2.5 x 1.2 cm left isthmic nodule, previously 3.3 x 2.4 x 1.2;
this was previously biopsied
IMPRESSION: 1. Thyromegaly with 2 nodules which have both been previously
biopsied.
Assuming negative cytology, no current indication for biopsy or
dedicated imaging follow-up.

The above is in keeping with the ACR TI-RADS recommendations - [HOSPITAL] 6022;[DATE].

## 2020-11-09 ENCOUNTER — Other Ambulatory Visit: Payer: Self-pay | Admitting: Internal Medicine

## 2020-11-20 NOTE — Patient Instructions (Addendum)
    Blood work was ordered.     Medications changes include :  none   Your prescription(s) have been submitted to your pharmacy. Please take as directed and contact our office if you believe you are having problem(s) with the medication(s).   Please followup in 6 months  

## 2020-11-20 NOTE — Progress Notes (Signed)
Subjective:    Patient ID: Frances Gray, female    DOB: [birth year], 70 y.o.   MRN: [id number]  HPI The patient is here for follow up of their chronic medical problems, including DM, htn, hld, multinodular goiter  She is not exercising regularly.     She is trying to watch her sugar intake.   Medications and allergies reviewed with patient and updated if appropriate.  Patient Active Problem List   Diagnosis Date Noted   Hiatal hernia, small 11/21/2020   Hyperparathyroidism ([place]) 05/23/2020   Hypocalcemia 05/22/2020   Hepatic steatosis 10/08/2019   Perforation of sigmoid colon due to diverticulitis 09/24/2019   Hypokalemia 09/24/2019   Plantar fasciitis, right 12/05/2016   Diabetes mellitus without complication ([place]) 51/07/5850   Nodule of flexor tendon sheath 01/23/2016   Midline low back pain without sciatica 01/23/2016   Knee osteoarthritis 12/06/2014   Hyperlipidemia    Osteopenia 12/29/2013   Severe obesity (BMI >= 40) ([place]) 11/16/2012   Multinodular goiter  - stable, s/p bx - no FU needed 11/28/2011   Anemia, unspecified 08/14/2011   Vitamin D deficiency disease 08/14/2011   Kidney stone 07/03/2011   Hypertension     Current Outpatient Medications on File Prior to Visit  Medication Sig Dispense Refill   calcium carbonate (CALCIUM 600) 600 MG TABS tablet Take 1 tablet (600 mg total) by mouth 2 (two) times daily with a meal. 60 tablet 6   Cholecalciferol (VITAMIN D3) 2000 UNITS TABS Take 2,000 Units by mouth every morning.      finasteride (PROSCAR) 5 MG tablet      Fluocinolone Acetonide Scalp 0.01 % OIL Apply 1 mL topically at bedtime as needed (itchy scalp).      fluticasone (FLONASE) 50 MCG/ACT nasal spray Place 1 spray into both nostrils daily as needed for allergies or rhinitis.   0   losartan-hydrochlorothiazide (HYZAAR) 100-25 MG tablet Take 1 tablet by mouth daily. 90 tablet 1   Multiple Vitamin (MULTIVITAMIN) tablet Take 1 tablet by mouth every morning.       rosuvastatin (CRESTOR) 5 MG tablet TAKE 1 TABLET BY MOUTH TWICE WEEKLY 24 tablet 1   sennosides-docusate sodium (SENOKOT-S) 8.6-50 MG tablet Take 1 tablet by mouth at bedtime.     No current facility-administered medications on file prior to visit.    Past Medical History:  Diagnosis Date   Allergic rhinitis, seasonal    takes Claritin daily as needed and uses Flonase if needed   Allergy    Arthritis    L knee s/p TKR 6/14, R knee s/p TKR 6/16   Diverticulitis    GERD (gastroesophageal reflux disease)    doesn't take any meds   Heart murmur    no symptoms   History of bronchitis winter of 2014   History of colon polyps    benign   History of kidney stones    Hyperlipidemia    Hypertension    takes Hyzaar daily   Vitamin D deficiency disease 08/14/2011   takes Vit D daily    Past Surgical History:  Procedure Laterality Date   BREAST BIOPSY Left    Breast ultrasound Right 03/02/13   The 52mm complex cyst in the right breast is probably benign. Follow-up in 6 months with bilateral mammogram and (R) ultrasound   Breast Ultrasound Right 09/02/13   There is a benign 3 mm complex cyst in the (R) breast at 9 o'clock   COLONOSCOPY     ESOPHAGUS SURGERY  1996   tumor removed   (benign)   KNEE ARTHROSCOPY     bilateral   LITHOTRIPSY  08/2011   R ureteral stone   POLYPECTOMY     TOTAL KNEE ARTHROPLASTY Left 11/16/2012   Procedure: TOTAL KNEE ARTHROPLASTY- left;  Surgeon: Johnny Bridge, MD;  Location: [facility];  Service: Orthopedics;  Laterality: Left;   TOTAL KNEE ARTHROPLASTY Right 12/06/2014   Procedure: TOTAL KNEE ARTHROPLASTY;  Surgeon: Marchia Bond, MD;  Location: [facility];  Service: Orthopedics;  Laterality: Right;    Social History   Socioeconomic History   Marital status: Married    Spouse name: Not on file   Number of children: Not on file   Years of education: Not on file   Highest education level: Not on file  Occupational History   Occupation: retired  Tobacco Use    Smoking status: Former    Packs/day: 0.25    Years: 10.00    Pack years: 2.50    Types: Cigarettes   Smokeless tobacco: Never   Tobacco comments:    quit smoking 10yrs ago  Vaping Use   Vaping Use: Never used  Substance and Sexual Activity   Alcohol use: No   Drug use: No   Sexual activity: Yes    Birth control/protection: Post-menopausal  Other Topics Concern   Not on file  Social History Narrative   Lives with spouse and g-son, temporarily mother in law (05/2014)   Social Determinants of Health   Financial Resource Strain: Low Risk    Difficulty of Paying Living Expenses: Not hard at all  Food Insecurity: No Food Insecurity   Worried About Charity fundraiser in the Last Year: Never true   [place] in the Last Year: Never true  Transportation Needs: No Transportation Needs   Lack of Transportation (Medical): No   Lack of Transportation (Non-Medical): No  Physical Activity: Inactive   Days of Exercise per Week: 0 days   Minutes of Exercise per Session: 0 min  Stress: No Stress Concern Present   Feeling of Stress : Not at all  Social Connections: Socially Integrated   Frequency of Communication with Friends and Family: More than three times a week   Frequency of Social Gatherings with Friends and Family: More than three times a week   Attends Religious Services: More than 4 times per year   Active Member of [employer] or Organizations: Yes   Attends Music therapist: More than 4 times per year   Marital Status: Married    Family History  Problem Relation Age of Onset   Ovarian cancer Mother    Colon cancer Sister 51   Breast cancer Sister    Esophageal cancer Neg Hx    Colon polyps Neg Hx    Rectal cancer Neg Hx    Stomach cancer Neg Hx     Review of Systems  Constitutional:  Negative for chills and fever.  Respiratory:  Negative for cough, shortness of breath and wheezing.   Cardiovascular:  Negative for chest pain, palpitations and leg  swelling.  Neurological:  Negative for light-headedness and headaches.      Objective:   Vitals:   11/21/20 0938  BP: 138/70  Pulse: 75  Temp: 98.3 F (36.8 C)  SpO2: 98%   BP Readings from Last 3 Encounters:  11/21/20 138/70  08/15/20 130/80  05/23/20 140/72   Wt Readings from Last 3 Encounters:  11/21/20 276 lb (125.2 kg)  08/15/20 272 lb 6.4 oz (123.6 kg)  05/23/20 275 lb (124.7 kg)   Body mass index is 47.38 kg/m.   Physical Exam    Constitutional: Appears well-developed and well-nourished. No distress.  HENT:  Head: Normocephalic and atraumatic.  Neck: Neck supple. No tracheal deviation present. No thyromegaly present.  No cervical lymphadenopathy Cardiovascular: Normal rate, regular rhythm and normal heart sounds.   No murmur heard. No carotid bruit .  No edema Pulmonary/Chest: Effort normal and breath sounds normal. No respiratory distress. No has no wheezes. No rales.  Skin: Skin is warm and dry. Not diaphoretic.  Psychiatric: Normal mood and affect. Behavior is normal.   Diabetic Foot Exam - Simple   Simple Foot Form Diabetic Foot exam was performed with the following findings: Yes 11/21/2020 10:01 AM  Visual Inspection No deformities, no ulcerations, no other skin breakdown bilaterally: Yes Sensation Testing Intact to touch and monofilament testing bilaterally: Yes Pulse Check Posterior Tibialis and Dorsalis pulse intact bilaterally: Yes Comments       Assessment & Plan:    See Problem List for Assessment and Plan of chronic medical problems.    This visit occurred during the SARS-CoV-2 public health emergency.  Safety protocols were in place, including screening questions prior to the visit, additional usage of staff PPE, and extensive cleaning of exam room while observing appropriate contact time as indicated for disinfecting solutions.

## 2020-11-21 ENCOUNTER — Ambulatory Visit (INDEPENDENT_AMBULATORY_CARE_PROVIDER_SITE_OTHER): Payer: Medicare Other | Admitting: Internal Medicine

## 2020-11-21 ENCOUNTER — Encounter: Payer: Self-pay | Admitting: Internal Medicine

## 2020-11-21 ENCOUNTER — Other Ambulatory Visit: Payer: Self-pay

## 2020-11-21 VITALS — BP 138/70 | HR 75 | Temp 98.3°F | Ht 64.0 in | Wt 276.0 lb

## 2020-11-21 DIAGNOSIS — I10 Essential (primary) hypertension: Secondary | ICD-10-CM | POA: Diagnosis not present

## 2020-11-21 DIAGNOSIS — K449 Diaphragmatic hernia without obstruction or gangrene: Secondary | ICD-10-CM | POA: Insufficient documentation

## 2020-11-21 DIAGNOSIS — E7849 Other hyperlipidemia: Secondary | ICD-10-CM | POA: Diagnosis not present

## 2020-11-21 DIAGNOSIS — E042 Nontoxic multinodular goiter: Secondary | ICD-10-CM

## 2020-11-21 DIAGNOSIS — E119 Type 2 diabetes mellitus without complications: Secondary | ICD-10-CM | POA: Diagnosis not present

## 2020-11-21 LAB — HEMOGLOBIN A1C: Hgb A1c MFr Bld: 6.7 % — ABNORMAL HIGH (ref 4.6–6.5)

## 2020-11-21 LAB — COMPREHENSIVE METABOLIC PANEL
ALT: 17 U/L (ref 0–35)
AST: 18 U/L (ref 0–37)
Albumin: 3.8 g/dL (ref 3.5–5.2)
Alkaline Phosphatase: 62 U/L (ref 39–117)
BUN: 14 mg/dL (ref 6–23)
CO2: 31 mEq/L (ref 19–32)
Calcium: 9.8 mg/dL (ref 8.4–10.5)
Chloride: 101 mEq/L (ref 96–112)
Creatinine, Ser: 0.72 mg/dL (ref 0.40–1.20)
GFR: 84.38 mL/min (ref >60.00)
Glucose, Bld: 103 mg/dL — ABNORMAL HIGH (ref 70–99)
Potassium: 4 mEq/L (ref 3.5–5.1)
Sodium: 137 mEq/L (ref 135–145)
Total Bilirubin: 0.4 mg/dL (ref 0.2–1.2)
Total Protein: 7.9 g/dL (ref 6.0–8.3)

## 2020-11-21 LAB — LIPID PANEL
Cholesterol: 182 mg/dL (ref 0–200)
HDL: 55 mg/dL (ref >39.00)
LDL Cholesterol: 104 mg/dL — ABNORMAL HIGH (ref 0–99)
NonHDL: 126.91
Total CHOL/HDL Ratio: 3
Triglycerides: 117 mg/dL (ref 0.0–149.0)
VLDL: 23.4 mg/dL (ref 0.0–40.0)

## 2020-11-21 LAB — TSH: TSH: 3.29 u[IU]/mL (ref 0.35–4.50)

## 2020-11-21 LAB — T4, FREE: Free T4: 0.73 ng/dL (ref 0.60–1.60)

## 2020-11-21 MED ORDER — LOSARTAN POTASSIUM-HCTZ 100-25 MG PO TABS: 1.0000 | ORAL_TABLET | Freq: Every day | ORAL | 1 refills | Status: DC

## 2020-11-21 NOTE — Assessment & Plan Note (Signed)
Chronic Diet controlled Check a1c Low sugar / carb diet Stressed regular exercise

## 2020-11-21 NOTE — Assessment & Plan Note (Signed)
Chronic BP well controlled Continue hyzaar 100-25 mg qd cmp

## 2020-11-21 NOTE — Assessment & Plan Note (Signed)
Chronic Stable - no change per pt Tsh, ft4

## 2020-11-21 NOTE — Assessment & Plan Note (Signed)
Chronic Check lipid panel, CMP Continue rosuvastatin 5 mg twice weekly

## 2020-11-28 ENCOUNTER — Telehealth: Payer: Self-pay | Admitting: Pharmacist

## 2020-11-28 DIAGNOSIS — Z1231 Encounter for screening mammogram for malignant neoplasm of breast: Secondary | ICD-10-CM | POA: Diagnosis not present

## 2020-11-28 LAB — HM MAMMOGRAPHY

## 2020-11-29 NOTE — Progress Notes (Signed)
    Chronic Care Management Pharmacy Assistant   Name: Frances Gray  MRN: [id number] DOB: August 11, 1950  Frances Gray is an 70 y.o. year old female who presents for his initial CCM visit with the clinical pharmacist.  Reason for Encounter:Initial Questions    Recent office visits:  11/21/20 Dr. Quay Burow, no medication changes  Recent consult visits:  None ID  Hospital visits:  None in previous 6 months  Medications: Outpatient Encounter Medications as of 11/28/2020  Medication Sig Note   calcium carbonate (CALCIUM 600) 600 MG TABS tablet Take 1 tablet (600 mg total) by mouth 2 (two) times daily with a meal.    Cholecalciferol (VITAMIN D3) 2000 UNITS TABS Take 2,000 Units by mouth every morning.     finasteride (PROSCAR) 5 MG tablet     Fluocinolone Acetonide Scalp 0.01 % OIL Apply 1 mL topically at bedtime as needed (itchy scalp).     fluticasone (FLONASE) 50 MCG/ACT nasal spray Place 1 spray into both nostrils daily as needed for allergies or rhinitis.  11/23/2014: ...   losartan-hydrochlorothiazide (HYZAAR) 100-25 MG tablet Take 1 tablet by mouth daily.    Multiple Vitamin (MULTIVITAMIN) tablet Take 1 tablet by mouth every morning.     rosuvastatin (CRESTOR) 5 MG tablet TAKE 1 TABLET BY MOUTH TWICE WEEKLY    sennosides-docusate sodium (SENOKOT-S) 8.6-50 MG tablet Take 1 tablet by mouth at bedtime.    No facility-administered encounter medications on file as of 11/28/2020.    Pharmacist Review  Have you seen any other providers since your last visit? No, patient states that she has not seen any other providers since Dr. Quay Burow  Any changes in your medications or health? No, patient states that she has not had any changes in medications or health  Any side effects from any medications? No, patient states that she is not having any side effects from medications  Do you have an symptoms or problems not managed by your medications? No, patient states that she is not having any  symptoms or problems not managed by medications  Any concerns about your health right now? No, patient states that she does not have any concerns about health at this time  Has your provider asked that you check blood pressure, blood sugar, or follow special diet at home? No, patient states that she has a cuff but does not check blood pressure, and she states she is broader line diabetic but not on medications.  Do you get any type of exercise on a regular basis? No, patient states she does not exercise  Can you think of a goal you would like to reach for your health? Patient states that her health goal is to lose weight  Do you have any problems getting your medications? No  Is there anything that you would like to discuss during the appointment? Patient states that she does not have anything specific to discuss at this time  Please bring medications and supplements to appointment   Star Rating Drugs: Rosuvastatin 11/09/20 90 ds Losartan-Hctz 11/26/20 90 ds   Frances Gray Clinical Pharmacist Assistant [phone]   Time spent:37

## 2020-11-30 ENCOUNTER — Other Ambulatory Visit: Payer: Self-pay

## 2020-11-30 ENCOUNTER — Ambulatory Visit (INDEPENDENT_AMBULATORY_CARE_PROVIDER_SITE_OTHER): Payer: Medicare Other | Admitting: Pharmacist

## 2020-11-30 DIAGNOSIS — M8580 Other specified disorders of bone density and structure, unspecified site: Secondary | ICD-10-CM

## 2020-11-30 DIAGNOSIS — I10 Essential (primary) hypertension: Secondary | ICD-10-CM

## 2020-11-30 DIAGNOSIS — E7849 Other hyperlipidemia: Secondary | ICD-10-CM | POA: Diagnosis not present

## 2020-11-30 DIAGNOSIS — E119 Type 2 diabetes mellitus without complications: Secondary | ICD-10-CM | POA: Diagnosis not present

## 2020-11-30 NOTE — Progress Notes (Signed)
Chronic Care Management Pharmacy Note  11/30/2020 Name:  Frances Gray MRN:  [id number] DOB:  07/27/1950  Summary: -Pt reports compliance and denies issues -Pt is due for DEXA scan (last 06/2016)  Recommendations/Changes made from today's visit: -Advised to increase exercise -Contact eye doctor for eye exam records (last 05/2020) -Coordinate with PCP to order repeat DEXA scan   Subjective: Frances Gray is an 70 y.o. year old female who is a primary patient of Burns, Claudina Lick, MD.  The CCM team was consulted for assistance with disease management and care coordination needs.    Engaged with patient face to face for initial visit in response to provider referral for pharmacy case management and/or care coordination services.   Consent to Services:  The patient was given the following information about Chronic Care Management services today, agreed to services, and gave verbal consent: 1. CCM service includes personalized support from designated clinical staff supervised by the primary care provider, including individualized plan of care and coordination with other care providers 2. 24/7 contact phone numbers for assistance for urgent and routine care needs. 3. Service will only be billed when office clinical staff spend 20 minutes or more in a month to coordinate care. 4. Only one practitioner may furnish and bill the service in a calendar month. 5.The patient may stop CCM services at any time (effective at the end of the month) by phone call to the office staff. 6. The patient will be responsible for cost sharing (co-pay) of up to 20% of the service fee (after annual deductible is met). Patient agreed to services and consent obtained.  Patient Care Team: Binnie Rail, MD as PCP - General (Internal Medicine) Festus Aloe, MD (Urology) Marchia Bond, MD (Orthopedic Surgery) Armandina Gemma, MD (General Surgery) Katy Apo, MD (Ophthalmology) Princess Bruins, MD (Obstetrics and  Gynecology) Inda Castle, MD (Inactive) (Gastroenterology) Charlton Haws, [facility] as Pharmacist (Pharmacist)   Patient is from [place] area, but spent her adult life in [place]. She moved back to [place] after retiring in 2012. She lives with her husband and 4 year old grandson who she has raised since he was 3.   Recent office visits: 11/21/20 Dr. Quay Burow OV: Chronic f/u; labs stable; no medication changes. F/U 6 months  Recent consult visits: 06/08/20 Dr Earl Gala (optometry): eye exam  Hospital visits: None in previous 6 months   Objective:  Lab Results  Component Value Date   CREATININE 0.72 11/21/2020   BUN 14 11/21/2020   GFR 84.38 11/21/2020   GFRNONAA >60 09/30/2019   GFRAA >60 09/30/2019   NA 137 11/21/2020   K 4.0 11/21/2020   CALCIUM 9.8 11/21/2020   CO2 31 11/21/2020   GLUCOSE 103 (H) 11/21/2020    Lab Results  Component Value Date/Time   HGBA1C 6.7 (H) 11/21/2020 10:09 AM   HGBA1C 6.6 (H) 05/23/2020 10:34 AM   GFR 84.38 11/21/2020 10:09 AM   GFR 83.29 05/23/2020 10:34 AM    Last diabetic Eye exam:  Lab Results  Component Value Date/Time   HMDIABEYEEXA No Retinopathy 01/04/2019 12:00 AM    Last diabetic Foot exam: No results found for: HMDIABFOOTEX   Lab Results  Component Value Date   CHOL 182 11/21/2020   HDL 55.00 11/21/2020   LDLCALC 104 (H) 11/21/2020   TRIG 117.0 11/21/2020   CHOLHDL 3 11/21/2020    Hepatic Function Latest Ref Rng & Units 11/21/2020 05/23/2020 09/30/2019  Total Protein 6.0 - 8.3 g/dL 7.9 7.8 6.7  Albumin 3.5 -  5.2 g/dL 3.8 3.7 2.6(L)  AST 0 - 37 U/L $Remo'18 17 22  'rMGlh$ ALT 0 - 35 U/L $Remo'17 17 20  'jRHaD$ Alk Phosphatase 39 - 117 U/L 62 64 53  Total Bilirubin 0.2 - 1.2 mg/dL 0.4 0.4 0.2(L)  Bilirubin, Direct 0.0 - 0.3 mg/dL - - -    Lab Results  Component Value Date/Time   TSH 3.29 11/21/2020 10:09 AM   TSH 3.70 05/23/2020 10:34 AM   FREET4 0.73 11/21/2020 10:09 AM    CBC Latest Ref Rng & Units 05/23/2020 09/30/2019 09/29/2019  WBC  4.0 - 10.5 K/uL 4.8 7.9 6.9  Hemoglobin 12.0 - 15.0 g/dL 13.1 11.8(L) 11.6(L)  Hematocrit 36.0 - 46.0 % 39.3 35.9(L) 35.4(L)  Platelets 150.0 - 400.0 K/uL 342.0 379 349    Lab Results  Component Value Date/Time   VD25OH 43.35 05/23/2020 10:34 AM   VD25OH 35.78 05/20/2016 11:13 AM    Clinical ASCVD: No  The 10-year ASCVD risk score Mikey Bussing DC Jr., et al., 2013) is: 28.8%   Values used to calculate the score:     Age: 62 years     Sex: Female     Is Non-Hispanic African American: Yes     Diabetic: Yes     Tobacco smoker: No     Systolic Blood Pressure: 060 mmHg     Is BP treated: Yes     HDL Cholesterol: 55 mg/dL     Total Cholesterol: 182 mg/dL    Depression screen [facility] 2/9 08/15/2020 11/30/2019 11/25/2018  Decreased Interest 0 0 0  Down, Depressed, Hopeless 0 0 0  PHQ - 2 Score 0 0 0  Altered sleeping - - -  Tired, decreased energy - - -  Change in appetite - - -  Feeling bad or failure about yourself  - - -  Trouble concentrating - - -  Moving slowly or fidgety/restless - - -  Suicidal thoughts - - -  PHQ-9 Score - - -  Difficult doing work/chores - - -     Social History   Tobacco Use  Smoking Status Former   Packs/day: 0.25   Years: 10.00   Pack years: 2.50   Types: Cigarettes  Smokeless Tobacco Never  Tobacco Comments   quit smoking 48yrs ago   BP Readings from Last 3 Encounters:  11/21/20 138/70  08/15/20 130/80  05/23/20 140/72   Pulse Readings from Last 3 Encounters:  11/21/20 75  08/15/20 (!) 55  05/23/20 (!) 57   Wt Readings from Last 3 Encounters:  11/21/20 276 lb (125.2 kg)  08/15/20 272 lb 6.4 oz (123.6 kg)  05/23/20 275 lb (124.7 kg)   BMI Readings from Last 3 Encounters:  11/21/20 47.38 kg/m  08/15/20 46.76 kg/m  05/23/20 47.20 kg/m    Assessment/Interventions: Review of patient past medical history, allergies, medications, health status, including review of consultants reports, laboratory and other test data, was performed as part of  comprehensive evaluation and provision of chronic care management services.   SDOH:  (Social Determinants of Health) assessments and interventions performed: Yes  SDOH Screenings   Alcohol Screen: Low Risk    Last Alcohol Screening Score (AUDIT): 0  Depression (PHQ2-9): Low Risk    PHQ-2 Score: 0  Financial Resource Strain: Low Risk    Difficulty of Paying Living Expenses: Not hard at all  Food Insecurity: No Food Insecurity   Worried About Charity fundraiser in the Last Year: Never true   Ran Out of Food in  the Last Year: Never true  Housing: Low Risk    Last Housing Risk Score: 0  Physical Activity: Inactive   Days of Exercise per Week: 0 days   Minutes of Exercise per Session: 0 min  Social Connections: Socially Integrated   Frequency of Communication with Friends and Family: More than three times a week   Frequency of Social Gatherings with Friends and Family: More than three times a week   Attends Religious Services: More than 4 times per year   Active Member of [employer] or Organizations: Yes   Attends Music therapist: More than 4 times per year   Marital Status: Married  Stress: No Stress Concern Present   Feeling of Stress : Not at all  Tobacco Use: Medium Risk   Smoking Tobacco Use: Former   Smokeless Tobacco Use: Never  Transportation Needs: No Data processing manager (Medical): No   Lack of Transportation (Non-Medical): No    CCM Care Plan  Allergies  Allergen Reactions   Amlodipine Cough   Metformin And Related     diarrhea   Pravastatin     Joint ache    Medications Reviewed Today     Reviewed by Charlton Haws, [facility] (Pharmacist) on 11/30/20 at [phone]  Med List Status: <None>   Medication Order Taking? Sig Documenting Provider Last Dose Status Informant  Biotin 10 MG TABS [id number] Yes Take by mouth. [provider] Taking Active   calcium carbonate (CALCIUM 600) 600 MG TABS tablet [id number] Yes Take 1  tablet (600 mg total) by mouth 2 (two) times daily with a meal. Rowe Clack, MD Taking Active Self  cetirizine (ZYRTEC) 10 MG tablet [id number] Yes Take 10 mg by mouth daily as needed for allergies. [provider] Taking Active   Cholecalciferol (VITAMIN D3) 2000 UNITS TABS [id number] Yes Take 2,000 Units by mouth every morning.  [provider] Taking Active Self  finasteride (PROSCAR) 5 MG tablet [id number] Yes  [provider] Taking Active   Fluocinolone Acetonide Scalp 0.01 % OIL [id number] Yes Apply 1 mL topically at bedtime as needed (itchy scalp).  [provider] Taking Active Self  fluticasone (FLONASE) 50 MCG/ACT nasal spray [id number] Yes Place 1 spray into both nostrils daily as needed for allergies or rhinitis.  [provider] Taking Active Self           Med Note Shea Stakes   Wed Nov 23, 2014 12:40 PM) ...  losartan-hydrochlorothiazide (HYZAAR) 100-25 MG tablet [id number] Yes Take 1 tablet by mouth daily. Binnie Rail, MD Taking Active   Multiple Vitamin (MULTIVITAMIN) tablet [id number] Yes Take 1 tablet by mouth every morning.  [provider] Taking Active Self  rosuvastatin (CRESTOR) 5 MG tablet [id number] Yes TAKE 1 TABLET BY MOUTH TWICE WEEKLY Burns, Claudina Lick, MD Taking Active   sennosides-docusate sodium (SENOKOT-S) 8.6-50 MG tablet [id number] Yes Take 1 tablet by mouth at bedtime. Binnie Rail, MD Taking Active Self            Patient Active Problem List   Diagnosis Date Noted   Hiatal hernia, small 11/21/2020   Hyperparathyroidism ([place]) 05/23/2020   Hepatic steatosis 10/08/2019   Perforation of sigmoid colon due to diverticulitis 09/24/2019   Plantar fasciitis, right 12/05/2016   Diabetes mellitus without complication ([place]) 93/71/6967   Nodule of flexor tendon sheath 01/23/2016   Midline low back pain without sciatica 01/23/2016   Knee osteoarthritis 12/06/2014   Hyperlipidemia  Osteopenia 12/29/2013    Severe obesity (BMI >= 40) (HCC) 11/16/2012   Multinodular goiter  - stable, s/p bx - no FU needed 11/28/2011   Anemia, unspecified 08/14/2011   Vitamin D deficiency disease 08/14/2011   Kidney stone 07/03/2011   Hypertension     Immunization History  Administered Date(s) Administered   Influenza, High Dose Seasonal PF 05/15/2015, 05/20/2016, 02/27/2017, 05/26/2018, 03/17/2019, 03/29/2020   Influenza, Seasonal, Injecte, Preservative Fre 08/12/2012   Influenza,inj,Quad PF,6+ Mos 02/11/2013, 05/12/2014   Moderna SARS-COV2 Booster Vaccination 10/12/2020   Moderna Sars-Covid-2 Vaccination 07/25/2019, 08/22/2019, 03/29/2020   Pneumococcal Conjugate-13 05/15/2015   Pneumococcal Polysaccharide-23 06/10/2010, 11/19/2016   Zoster Recombinat (Shingrix) 12/03/2018, 02/10/2019   Zoster, Live 10/09/2010    Conditions to be addressed/monitored:  Hypertension, Hyperlipidemia, Diabetes, and Osteopenia  Care Plan : [place]  Updates made by Charlton Haws, [place] since 11/30/2020 12:00 AM     Problem: Hypertension, Hyperlipidemia, Diabetes, and Osteopenia   Priority: High     Long-Range Goal: Disease management   Start Date: 11/30/2020  Expected End Date: 11/30/2021  This Visit's Progress: On track  Priority: High  Note:   Current Barriers:  Unable to independently monitor therapeutic efficacy  Pharmacist Clinical Goal(s):  Patient will achieve adherence to monitoring guidelines and medication adherence to achieve therapeutic efficacy through collaboration with PharmD and provider.   Interventions: 1:1 collaboration with Binnie Rail, MD regarding development and update of comprehensive plan of care as evidenced by provider attestation and co-signature Inter-disciplinary care team collaboration (see longitudinal plan of care) Comprehensive medication review performed; medication list updated in electronic medical record  Hypertension (BP goal <140/90) -Controlled -  BP in office has been at goal; pt does not have a home BP monitor, she is going to work with insurance to get one -Current treatment: Losartan-HCTZ 100-25 mg daily -Current dietary habits: tries to avoid salt, pork; enjoys salads/veggies -Current exercise habits: none -Denies hypotensive/hypertensive symptoms -Educated on BP goals and benefits of medications for prevention of heart attack, stroke and kidney damage; Daily salt intake goal < 2300 mg; Exercise goal of 150 minutes per week; Importance of home blood pressure monitoring; Symptoms of hypotension and importance of maintaining adequate hydration; -Counseled to monitor BP at home 3x a week, document, and provide log at future appointments -Recommended to continue current medication  Hyperlipidemia: (LDL goal < 100) -Controlled - LDL is close to goal; pt endorses compliance and denies side effects -Current treatment: Rosuvastatin 5 mg twice a week (Mon, Fri) -Medications previously tried: pravastatin  -Educated on Cholesterol goals; Benefits of statin for ASCVD risk reduction; -Recommended to continue current medication  Diabetes (A1c goal <7%) -Diet-controlled  -Medications previously tried: metformin (2018)  -Educated on A1c and blood sugar goals; Complications of diabetes including kidney damage, retinal damage, and cardiovascular disease; Exercise goal of 150 minutes per week; Benefits of weight loss; -Counseled to check feet daily and get yearly eye exams  Osteopenia (Goal: prevent fractures) -Controlled - pt is due for repeat DEXA scan -Last DEXA Scan: 07/04/2016   T-Score femoral neck: -1.3 T-Score lumbar spine: +0.3  10-year probability of major osteoporotic fracture: 3.2%  10-year probability of hip fracture: 0.3% -Patient is not a candidate for pharmacologic treatment -Current treatment  Calcium carbonate 600 mg BID Vitamin D 2000 IU daily -Recommend weight-bearing and muscle strengthening exercises for building and  maintaining bone density.  Hair loss (Goal: improve hair growth) -Controlled - pt reports improvement since starting finasteride -Current treatment  Finasteride 5 mg daily -Recommended to continue current medication  Health Maintenance -Vaccine gaps: covid booster -Pt provided covid vaccine card, last dose given 10/12/20 -Current therapy:  Multivitamin Biotin Fluticasone nasal spray PRN Senna-docusate Cetirizine 10 mg PRN -Patient is satisfied with current therapy and denies issues -Recommended to continue current medication  Patient Goals/Self-Care Activities Patient will:  - take medications as prescribed focus on medication adherence by routine check blood pressure 3x a week, document, and provide at future appointments target a minimum of 150 minutes of moderate intensity exercise weekly      Medication Assistance: None required.  Patient affirms current coverage meets needs.  Compliance/Adherence/Medication fill history: Care Gaps: COVID booster (due 07/30/20) - received 10/12/20 DEXA scan (due 06/25/19) Eye exam (due 01/04/20) - per claims 05/2020  Star-Rating Drugs: Losartan-HCTZ - LF 11/26/20 x 90 ds Rosuvastatin - LF 11/09/20 x 84 ds  Patient's preferred pharmacy is:  [id number] - [place], [address] [place] [address] Phone: [phone] Fax: [phone]  Uses pill box? No - prefers bottles Pt endorses 100% compliance  We discussed: Current pharmacy is preferred with insurance plan and patient is satisfied with pharmacy services Patient decided to: Continue current medication management strategy  Care Plan and Follow Up Patient Decision:  Patient agrees to Care Plan and Follow-up.  Plan: Telephone follow up appointment with care management team member scheduled for:  9 months  Charlene Brooke, PharmD, [place], CPP Clinical Pharmacist [facility] at [facility] [phone]

## 2020-11-30 NOTE — Patient Instructions (Signed)
Visit Information  Phone number for Pharmacist: [phone]  Thank you for meeting with me to discuss your medications! I look forward to working with you to achieve your health care goals. Below is a summary of what we talked about during the visit:   Goals Addressed             This Visit's Progress    Manage My Medicine       Timeframe:  Long-Range Goal Priority:  Medium Start Date:     11/30/20                        Expected End Date:    11/30/21                   Follow Up Date Dec 2022   - call for medicine refill 2 or 3 days before it runs out - call if I am sick and can't take my medicine - keep a list of all the medicines I take; vitamins and herbals too  - check blood pressure 3x a week, document, and provide at future appointments - target a minimum of 150 minutes of moderate intensity exercise weekly   Why is this important?   These steps will help you keep on track with your medicines.   Notes:          Ms. Peron was given information about Chronic Care Management services today including:  CCM service includes personalized support from designated clinical staff supervised by her physician, including individualized plan of care and coordination with other care providers 24/7 contact phone numbers for assistance for urgent and routine care needs. Standard insurance, coinsurance, copays and deductibles apply for chronic care management only during months in which we provide at least 20 minutes of these services. Most insurances cover these services at 100%, however patients may be responsible for any copay, coinsurance and/or deductible if applicable. This service may help you avoid the need for more expensive face-to-face services. Only one practitioner may furnish and bill the service in a calendar month. The patient may stop CCM services at any time (effective at the end of the month) by phone call to the office staff.  Patient agreed to services and verbal  consent obtained.   Patient verbalizes understanding of instructions provided today and agrees to view in [place].  Telephone follow up appointment with pharmacy team member scheduled for: 9 months  Charlene Brooke, PharmD, [place], CPP Clinical Pharmacist [facility] at [facility] [phone]

## 2020-12-02 ENCOUNTER — Other Ambulatory Visit: Payer: Self-pay | Admitting: Internal Medicine

## 2020-12-02 DIAGNOSIS — E2839 Other primary ovarian failure: Secondary | ICD-10-CM

## 2020-12-02 DIAGNOSIS — M85859 Other specified disorders of bone density and structure, unspecified thigh: Secondary | ICD-10-CM

## 2020-12-13 ENCOUNTER — Encounter: Payer: Self-pay | Admitting: Internal Medicine

## 2020-12-14 DIAGNOSIS — L658 Other specified nonscarring hair loss: Secondary | ICD-10-CM | POA: Diagnosis not present

## 2020-12-14 DIAGNOSIS — L811 Chloasma: Secondary | ICD-10-CM | POA: Insufficient documentation

## 2020-12-14 DIAGNOSIS — L661 Lichen planopilaris: Secondary | ICD-10-CM | POA: Diagnosis not present

## 2020-12-20 DIAGNOSIS — R921 Mammographic calcification found on diagnostic imaging of breast: Secondary | ICD-10-CM | POA: Diagnosis not present

## 2020-12-20 LAB — HM MAMMOGRAPHY

## 2021-01-03 ENCOUNTER — Ambulatory Visit (INDEPENDENT_AMBULATORY_CARE_PROVIDER_SITE_OTHER): Payer: Medicare Other | Admitting: Internal Medicine

## 2021-01-03 ENCOUNTER — Ambulatory Visit (INDEPENDENT_AMBULATORY_CARE_PROVIDER_SITE_OTHER): Payer: Medicare Other

## 2021-01-03 ENCOUNTER — Encounter: Payer: Self-pay | Admitting: Internal Medicine

## 2021-01-03 ENCOUNTER — Other Ambulatory Visit: Payer: Self-pay

## 2021-01-03 ENCOUNTER — Telehealth: Payer: Self-pay

## 2021-01-03 VITALS — BP 140/80 | HR 73 | Temp 98.4°F | Ht 64.0 in | Wt 280.0 lb

## 2021-01-03 DIAGNOSIS — M5136 Other intervertebral disc degeneration, lumbar region: Secondary | ICD-10-CM | POA: Diagnosis not present

## 2021-01-03 DIAGNOSIS — M5432 Sciatica, left side: Secondary | ICD-10-CM | POA: Diagnosis not present

## 2021-01-03 DIAGNOSIS — M4316 Spondylolisthesis, lumbar region: Secondary | ICD-10-CM | POA: Diagnosis not present

## 2021-01-03 DIAGNOSIS — R0781 Pleurodynia: Secondary | ICD-10-CM

## 2021-01-03 DIAGNOSIS — L668 Other cicatricial alopecia: Secondary | ICD-10-CM | POA: Diagnosis not present

## 2021-01-03 MED ORDER — CELECOXIB 100 MG PO CAPS: 100.0000 mg | ORAL_CAPSULE | Freq: Two times a day (BID) | ORAL | 0 refills | Status: DC

## 2021-01-03 MED ORDER — PREDNISONE 20 MG PO TABS: 40.0000 mg | ORAL_TABLET | Freq: Every day | ORAL | 0 refills | Status: AC

## 2021-01-03 MED ORDER — GABAPENTIN 100 MG PO CAPS: 100.0000 mg | ORAL_CAPSULE | Freq: Every day | ORAL | 3 refills | Status: DC

## 2021-01-03 NOTE — Telephone Encounter (Signed)
Patient scheduled for today

## 2021-01-03 NOTE — Telephone Encounter (Signed)
pt has stated she has pain from the left buttocks radiating down to her lt foot a/w a knot in her lt calf and a tightness sensation in the calf. Pt also has stated she felt like her big toe has went numb as well. Pt states she is concerned that she may have a blood clot.  Pt denies any swelling, redness and warmth to the area. Pt also denies sob or trouble breathing at this moment.

## 2021-01-03 NOTE — Patient Instructions (Addendum)
Have xrays downstairs.      Medications changes include :   start prednisone 40 mg daily x 5 days - take with food ideally with breakfast.  Start celebrex after completing the prednisone ( nsaid- no advil or aleve while taking this).  Start gabapentin for the nerve pain at night.   Your prescription(s) have been submitted to your pharmacy. Please take as directed and contact our office if you believe you are having problem(s) with the medication(s).   A referral was ordered for PT in [place].      Someone from their office will call you to schedule an appointment.    Please call if there is no improvement in your symptoms.     Sciatica  Sciatica is pain, weakness, tingling, or loss of feeling (numbness) along the sciatic nerve. The sciatic nerve starts in the lower back and goes down the back of each leg. Sciatica usually goes away on its own or with treatment. Sometimes, sciatica may come back (recur). What are the causes? This condition happens when the sciatic nerve is pinched or has pressure put on it. This may be the result of: A disk in between the bones of the spine bulging out too far (herniated disk). Changes in the spinal disks that occur with aging. A condition that affects a muscle in the butt. Extra bone growth near the sciatic nerve. A break (fracture) of the area between your hip bones (pelvis). Pregnancy. Tumor. This is rare. What increases the risk? You are more likely to develop this condition if you: Play sports that put pressure or stress on the spine. Have poor strength and ease of movement (flexibility). Have had a back injury in the past. Have had back surgery. Sit for long periods of time. Do activities that involve bending or lifting over and over again. Are very overweight (obese). What are the signs or symptoms? Symptoms can vary from mild to very bad. They may include: Any of these problems in the lower back, leg, hip, or butt: Mild tingling,  loss of feeling, or dull aches. Burning sensations. Sharp pains. Loss of feeling in the back of the calf or the sole of the foot. Leg weakness. Very bad back pain that makes it hard to move. These symptoms may get worse when you cough, sneeze, or laugh. They may alsoget worse when you sit or stand for long periods of time. How is this treated? This condition often gets better without any treatment. However, treatment may include: Changing or cutting back on physical activity when you have pain. Doing exercises and stretching. Putting ice or heat on the affected area. Medicines that help: To relieve pain and swelling. To relax your muscles. Shots (injections) of medicines that help to relieve pain, irritation, and swelling. Surgery. Follow these instructions at home: Medicines Take over-the-counter and prescription medicines only as told by your doctor. Ask your doctor if the medicine prescribed to you: Requires you to avoid driving or using heavy machinery. Can cause trouble pooping (constipation). You may need to take these steps to prevent or treat trouble pooping: Drink enough fluids to keep your pee (urine) pale yellow. Take over-the-counter or prescription medicines. Eat foods that are high in fiber. These include beans, whole grains, and fresh fruits and vegetables. Limit foods that are high in fat and sugar. These include fried or sweet foods. Managing pain     If told, put ice on the affected area. Put ice in a plastic bag. Place a  towel between your skin and the bag. Leave the ice on for 20 minutes, 2-3 times a day. If told, put heat on the affected area. Use the heat source that your doctor tells you to use, such as a moist heat pack or a heating pad. Place a towel between your skin and the heat source. Leave the heat on for 20-30 minutes. Remove the heat if your skin turns bright red. This is very important if you are unable to feel pain, heat, or cold. You may have a  greater risk of getting burned. Activity  Return to your normal activities as told by your doctor. Ask your doctor what activities are safe for you. Avoid activities that make your symptoms worse. Take short rests during the day. When you rest for a long time, do some physical activity or stretching between periods of rest. Avoid sitting for a long time without moving. Get up and move around at least one time each hour. Exercise and stretch regularly, as told by your doctor. Do not lift anything that is heavier than 10 lb (4.5 kg) while you have symptoms of sciatica. Avoid lifting heavy things even when you do not have symptoms. Avoid lifting heavy things over and over. When you lift objects, always lift in a way that is safe for your body. To do this, you should: Bend your knees. Keep the object close to your body. Avoid twisting.  General instructions Stay at a healthy weight. Wear comfortable shoes that support your feet. Avoid wearing high heels. Avoid sleeping on a mattress that is too soft or too hard. You might have less pain if you sleep on a mattress that is firm enough to support your back. Keep all follow-up visits as told by your doctor. This is important. Contact a doctor if: You have pain that: Wakes you up when you are sleeping. Gets worse when you lie down. Is worse than the pain you have had in the past. Lasts longer than 4 weeks. You lose weight without trying. Get help right away if: You cannot control when you pee (urinate) or poop (have a bowel movement). You have weakness in any of these areas and it gets worse: Lower back. The area between your hip bones. Butt. Legs. You have redness or swelling of your back. You have a burning feeling when you pee. Summary Sciatica is pain, weakness, tingling, or loss of feeling (numbness) along the sciatic nerve. This condition happens when the sciatic nerve is pinched or has pressure put on it. Sciatica can cause  pain, tingling, or loss of feeling (numbness) in the lower back, legs, hips, and butt. Treatment often includes rest, exercise, medicines, and putting ice or heat on the affected area. This information is not intended to replace advice given to you by your health care provider. Make sure you discuss any questions you have with your healthcare provider. Document Revised: 06/15/2018 Document Reviewed: 06/15/2018 Elsevier Patient Education  [place].

## 2021-01-03 NOTE — Assessment & Plan Note (Signed)
Acute Started about one month ago - no obvious cause msk in nature Will check rib xray Above medication will likely help

## 2021-01-03 NOTE — Progress Notes (Signed)
Subjective:    Patient ID: Frances Gray, female    DOB: Jun 04, 1951, 70 y.o.   MRN: [id number]  HPI The patient is here for an acute visit.   Pain from left buttock down to left leg to toes.  It is worse when she sits or goes to bed.  It is better when moving around.  The pain in her butt is constant.    Sometimes she has a bump in her lateral left lower leg - it comes and goes.  At night her left foot gets numb/tingly. It started 1- 1.5 weeks ago.  No leg weakness.  No change in urine/stools.  No prior back issues, except some chronic lower back pain.  No injury.  Aleve has helped.     Right side under breast pain if she makes a moves she gets a sharp pain.Marland Kitchen  this started about one month ago.  It is intermittent.  When she stands up from sitting or twisting in bed she feels it. No injuries    She has lower back pain - that is chronic and intermittent    Medications and allergies reviewed with patient and updated if appropriate.  Patient Active Problem List   Diagnosis Date Noted   Hiatal hernia, small 11/21/2020   Hyperparathyroidism ([place]) 05/23/2020   Hepatic steatosis 10/08/2019   Perforation of sigmoid colon due to diverticulitis 09/24/2019   Plantar fasciitis, right 12/05/2016   Diabetes mellitus without complication ([place]) 99991111   Nodule of flexor tendon sheath 01/23/2016   Midline low back pain without sciatica 01/23/2016   Knee osteoarthritis 12/06/2014   Hyperlipidemia    Osteopenia 12/29/2013   Severe obesity (BMI >= 40) ([place]) 11/16/2012   Multinodular goiter  - stable, s/p bx - no FU needed 11/28/2011   Anemia, unspecified 08/14/2011   Vitamin D deficiency disease 08/14/2011   Kidney stone 07/03/2011   Hypertension     Current Outpatient Medications on File Prior to Visit  Medication Sig Dispense Refill   Biotin 10 MG TABS Take by mouth.     calcium carbonate (CALCIUM 600) 600 MG TABS tablet Take 1 tablet (600 mg total) by mouth 2 (two) times daily with  a meal. 60 tablet 6   cetirizine (ZYRTEC) 10 MG tablet Take 10 mg by mouth daily as needed for allergies.     Cholecalciferol (VITAMIN D3) 2000 UNITS TABS Take 2,000 Units by mouth every morning.      finasteride (PROSCAR) 5 MG tablet      Fluocinolone Acetonide Scalp 0.01 % OIL Apply 1 mL topically at bedtime as needed (itchy scalp).      fluticasone (FLONASE) 50 MCG/ACT nasal spray Place 1 spray into both nostrils daily as needed for allergies or rhinitis.   0   hydroquinone 4 % cream Apply topically at bedtime.     losartan-hydrochlorothiazide (HYZAAR) 100-25 MG tablet Take 1 tablet by mouth daily. 90 tablet 1   Multiple Vitamin (MULTIVITAMIN) tablet Take 1 tablet by mouth every morning.      rosuvastatin (CRESTOR) 5 MG tablet TAKE 1 TABLET BY MOUTH TWICE WEEKLY 24 tablet 1   sennosides-docusate sodium (SENOKOT-S) 8.6-50 MG tablet Take 1 tablet by mouth at bedtime.     No current facility-administered medications on file prior to visit.    Past Medical History:  Diagnosis Date   Allergic rhinitis, seasonal    takes Claritin daily as needed and uses Flonase if needed   Allergy    Arthritis  L knee s/p TKR 6/14, R knee s/p TKR 6/16   Diverticulitis    GERD (gastroesophageal reflux disease)    doesn't take any meds   Heart murmur    no symptoms   History of bronchitis winter of 2014   History of colon polyps    benign   History of kidney stones    Hyperlipidemia    Hypertension    takes Hyzaar daily   Vitamin D deficiency disease 08/14/2011   takes Vit D daily    Past Surgical History:  Procedure Laterality Date   BREAST BIOPSY Left    Breast ultrasound Right 03/02/13   The 12m complex cyst in the right breast is probably benign. Follow-up in 6 months with bilateral mammogram and (R) ultrasound   Breast Ultrasound Right 09/02/13   There is a benign 3 mm complex cyst in the (R) breast at 9 o'clock   [place]  tumor removed   (benign)   KNEE  ARTHROSCOPY     bilateral   LITHOTRIPSY  08/2011   R ureteral stone   POLYPECTOMY     TOTAL KNEE ARTHROPLASTY Left 11/16/2012   Procedure: TOTAL KNEE ARTHROPLASTY- left;  Surgeon: JJohnny Bridge MD;  [facility]  Service: Orthopedics;  Laterality: Left;   TOTAL KNEE ARTHROPLASTY Right 12/06/2014   Procedure: TOTAL KNEE ARTHROPLASTY;  Surgeon: JMarchia Bond MD;  Location[facility]  Service: Orthopedics;  Laterality: Right;    Social History   Socioeconomic History   Marital status: Married    Spouse name: Not on file   Number of children: Not on file   Years of education: Not on file   Highest education level: Not on file  Occupational History   Occupation: retired  Tobacco Use   Smoking status: Former    Packs/day: 0.25    Years: 10.00    Pack years: 2.50    Types: Cigarettes   Smokeless tobacco: Never   Tobacco comments:    quit smoking 322yrago  Vaping Use   Vaping Use: Never used  Substance and Sexual Activity   Alcohol use: No   Drug use: No   Sexual activity: Yes    Birth control/protection: Post-menopausal  Other Topics Concern   Not on file  Social History Narrative   Lives with spouse and g-son, temporarily mother in law (05/2014)   Social Determinants of Health   Financial Resource Strain: Low Risk    Difficulty of Paying Living Expenses: Not hard at all  Food Insecurity: No Food Insecurity   Worried About RuCharity fundraisern the Last Year: Never true   Ra[place] the Last Year: Never true  Transportation Needs: No Transportation Needs   Lack of Transportation (Medical): No   Lack of Transportation (Non-Medical): No  Physical Activity: Inactive   Days of Exercise per Week: 0 days   Minutes of Exercise per Session: 0 min  Stress: No Stress Concern Present   Feeling of Stress : Not at all  Social Connections: Socially Integrated   Frequency of Communication with Friends and Family: More than three times a week   Frequency of Social  Gatherings with Friends and Family: More than three times a week   Attends Religious Services: More than 4 times per year   Active Member of [employer] Organizations: Yes   Attends ClArchiviste[employer]ings: More than 4 times per year  Marital Status: Married    Family History  Problem Relation Age of Onset   Ovarian cancer Mother    Colon cancer Sister 57   Breast cancer Sister    Esophageal cancer Neg Hx    Colon polyps Neg Hx    Rectal cancer Neg Hx    Stomach cancer Neg Hx     Review of Systems Per HPI    Objective:   Vitals:   01/03/21 1347  BP: 140/80  Pulse: 73  Temp: 98.4 F (36.9 C)  SpO2: 99%   BP Readings from Last 3 Encounters:  01/03/21 140/80  11/21/20 138/70  08/15/20 130/80   Wt Readings from Last 3 Encounters:  01/03/21 280 lb (127 kg)  11/21/20 276 lb (125.2 kg)  08/15/20 272 lb 6.4 oz (123.6 kg)   Body mass index is 48.06 kg/m.   Physical Exam Constitutional:      General: She is not in acute distress.    Appearance: She is well-developed. She is not ill-appearing.  HENT:     Head: Normocephalic and atraumatic.  Chest:     Chest wall: No tenderness (no tenderness right lateral/anterior ribs - some pain with certain movement).  Musculoskeletal:     Right lower leg: No edema.     Left lower leg: No edema.  Skin:    General: Skin is warm and dry.     Findings: No rash.  Neurological:     Mental Status: She is alert.     Motor: No weakness (in b/l LE).     Comments: Normal sensation b/l LE.  Positive left straight leg raise           Assessment & Plan:    See Problem List for Assessment and Plan of chronic medical problems.    This visit occurred during the SARS-CoV-2 public health emergency.  Safety protocols were in place, including screening questions prior to the visit, additional usage of staff PPE, and extensive cleaning of exam room while observing appropriate contact time as indicated for disinfecting  solutions.

## 2021-01-03 NOTE — Assessment & Plan Note (Signed)
Acute Started 1-1.5 weeks ago No concerning symptoms Discussed treatment options Xray of lumbar spine Refer to PT Start prednisone 40 mg qd x 5 days  - then start celebrex 100 mg BID  Start gabapentin 100 mg HS Call or return if no improvement

## 2021-01-03 NOTE — Telephone Encounter (Signed)
With the pain radiating from her buttock down to her leg it sounds like a pinched nerve.  Depending on how bad her symptoms are she should consider getting evaluated sooner-okay to see her Friday if she wants to wait

## 2021-01-29 ENCOUNTER — Ambulatory Visit: Payer: Medicare Other | Attending: Internal Medicine | Admitting: Physical Therapy

## 2021-01-29 ENCOUNTER — Other Ambulatory Visit: Payer: Self-pay

## 2021-01-29 ENCOUNTER — Encounter: Payer: Self-pay | Admitting: Physical Therapy

## 2021-01-29 DIAGNOSIS — M62838 Other muscle spasm: Secondary | ICD-10-CM | POA: Insufficient documentation

## 2021-01-29 DIAGNOSIS — M6281 Muscle weakness (generalized): Secondary | ICD-10-CM | POA: Diagnosis not present

## 2021-01-29 DIAGNOSIS — R29898 Other symptoms and signs involving the musculoskeletal system: Secondary | ICD-10-CM | POA: Insufficient documentation

## 2021-01-29 DIAGNOSIS — M5432 Sciatica, left side: Secondary | ICD-10-CM | POA: Insufficient documentation

## 2021-01-29 NOTE — Patient Instructions (Signed)
      Access Code: [email or web address] URL: [email or web address] Date: 01/29/2021 Prepared by: Annie Paras  Exercises Hooklying Single Knee to Chest Stretch with Towel - 2-3 x daily - 7 x weekly - 3 reps - 30 sec hold Supine Piriformis Stretch with Towel - 2-3 x daily - 7 x weekly - 3 reps - 30 sec hold Seated Table Piriformis Stretch - 2-3 x daily - 7 x weekly - 3 reps - 30 sec hold Seated Table Hamstring Stretch - 2-3 x daily - 7 x weekly - 3 reps - 30 sec hold

## 2021-01-29 NOTE — Therapy (Signed)
[facility] [address]  [place], [place], [address] Phone: [phone]   Fax:  [phone]  Physical Therapy Evaluation  Patient Details  Name: Frances Gray MRN: [id number] Date of Birth: 12/24/1950 Referring Provider (PT): Binnie Rail, MD   Encounter Date: 01/29/2021   PT End of Session - 01/29/21 0933     Visit Number 1    Number of Visits 13    Date for PT Re-Evaluation 03/12/21    Authorization Type Medicare, [place]    PT Start Time [phone]    PT Stop Time 1022    PT Time Calculation (min) 49 min    Activity Tolerance Patient tolerated treatment well    Behavior During Therapy [facility] for tasks assessed/performed             Past Medical History:  Diagnosis Date   Allergic rhinitis, seasonal    takes Claritin daily as needed and uses Flonase if needed   Allergy    Arthritis    L knee s/p TKR 6/14, R knee s/p TKR 6/16   Diverticulitis    GERD (gastroesophageal reflux disease)    doesn't take any meds   Heart murmur    no symptoms   History of bronchitis winter of 2014   History of colon polyps    benign   History of kidney stones    Hyperlipidemia    Hypertension    takes Hyzaar daily   Vitamin D deficiency disease 08/14/2011   takes Vit D daily    Past Surgical History:  Procedure Laterality Date   BREAST BIOPSY Left    Breast ultrasound Right 03/02/13   The 10m complex cyst in the right breast is probably benign. Follow-up in 6 months with bilateral mammogram and (R) ultrasound   Breast Ultrasound Right 09/02/13   There is a benign 3 mm complex cyst in the (R) breast at 9 o'clock   [place]  tumor removed   (benign)   KNEE ARTHROSCOPY     bilateral   LITHOTRIPSY  08/2011   R ureteral stone   POLYPECTOMY     TOTAL KNEE ARTHROPLASTY Left 11/16/2012   Procedure: TOTAL KNEE ARTHROPLASTY- left;  Surgeon: JJohnny Bridge MD;  [facility]   Service: Orthopedics;  Laterality: Left;   TOTAL KNEE ARTHROPLASTY Right 12/06/2014   Procedure: TOTAL KNEE ARTHROPLASTY;  Surgeon: JMarchia Bond MD;  [place] [facility]  Service: Orthopedics;  Laterality: Right;    There were no vitals filed for this visit.    Subjective Assessment - 01/29/21 0937     Subjective Pt reports onest of L LE sciatica with pins and needles in her foot in July, with internittent numbness also noted. Also notes pain in her L lower buttock. Placing her foot on an ice pack will help with pin and needles but never goes away fully.    Pertinent History B TKA - 2014 & 2016    Diagnostic tests 01/03/21 - Lumbar spine x-ray: Minimal grade 1 retrolisthesis of L3-4 is noted. Mild degenerative disc disease is noted at this level with anterior posterior osteophyte formation. Minimal grade 1 anterolisthesis of L4-5 is noted secondary to posterior facet joint hypertrophy. No definite fracture is noted.    Patient Stated Goals "for the numbness to go away"    Currently in Pain? Yes    Pain Score 5  Pain Location Leg    Pain Orientation Left    Pain Descriptors / Indicators Pins and needles;Numbness;Sore    Pain Type Acute pain    Pain Radiating Towards pain/soreness in L buttock with pins & needles down to L foot and intermittent numbness in L foot    Pain Onset 1 to 4 weeks ago   July 2022   Pain Frequency Constant    Aggravating Factors  unsure    Pain Relieving Factors ice pack    Effect of Pain on Daily Activities hasn't had to stop doing her normal things yet                [facility] PT Assessment - 01/29/21 0933       Assessment   Medical Diagnosis L sciatica    Referring Provider (PT) Binnie Rail, MD    Onset Date/Surgical Date --   July 2022   Hand Dominance Right    Next MD Visit 05/23/21    Prior Therapy PT s/p B TKR      Precautions   Precautions None      Restrictions   Weight Bearing Restrictions No      Balance Screen   Has the patient  fallen in the past 6 months No    Has the patient had a decrease in activity level because of a fear of falling?  Yes    Is the patient reluctant to leave their home because of a fear of falling?  No      Home Ecologist residence    Living Arrangements Spouse/significant other;Other relatives   grandson   Type of [place] to enter    Entrance Stairs-Number of Steps 2    Entrance Stairs-Rails Right;Left;Can reach both    [place] One level    [place] - 2 wheels;Cane - quad      Prior Function   Level of Independence Independent    Vocation Retired    Leisure running behind her grandson; walking ~30 min 3x/wk      Cognition   Overall Cognitive Status Within Functional Limits for tasks assessed      Observation/Other Assessments   Focus on Therapeutic Outcomes (FOTO)  Hip = 71; predicted D/C FS = 75      ROM / Strength   AROM / PROM / Strength AROM;Strength      AROM   AROM Assessment Site Lumbar;Hip    Lumbar Flexion fingertips to toes    Lumbar Extension WFL    Lumbar - Right Side Bend fingertips to 2" above femoral condyle    Lumbar - Left Side Bend fingertips to 2" above femoral condyle    Lumbar - Right Rotation 25% limited    Lumbar - Left Rotation 10% limited      Strength   Strength Assessment Site Hip;Knee;Ankle    Right/Left Hip Right;Left    Right Hip Flexion 4+/5    Right Hip Extension 4-/5    Right Hip External Rotation  4/5    Right Hip Internal Rotation 4+/5    Right Hip ABduction 4+/5    Right Hip ADduction 4+/5    Left Hip Flexion 4/5    Left Hip Extension 4-/5    Left Hip External Rotation 4/5    Left Hip Internal Rotation 4+/5    Left Hip ABduction 4/5    Left Hip ADduction 4/5    Right/Left  Knee Right;Left    Right Knee Flexion 5/5    Right Knee Extension 5/5    Left Knee Flexion 5/5    Left Knee Extension 5/5    Right/Left Ankle Right;Left    Right Ankle Dorsiflexion  4+/5    Left Ankle Dorsiflexion 4+/5      Flexibility   Soft Tissue Assessment /Muscle Length yes    Hamstrings mild tight L>R    Quadriceps mod tight quads & hip flexors B    ITB mod tight L>R    Piriformis mod tight L>R      Palpation   Palpation comment increased muscle tension in L>R glutes & piriformis with mild TTP over L piriformis                        Objective measurements completed on examination: See above findings.       [facility] Adult PT Treatment/Exercise - 01/29/21 0933       Exercises   Exercises Lumbar      Lumbar Exercises: Stretches   Passive Hamstring Stretch Left;2 reps;30 seconds    Passive Hamstring Stretch Limitations seated hip hinge with foot on floor & longsitting hip hinge    Single Knee to Chest Stretch Left;1 rep;30 seconds    Single Knee to Chest Stretch Limitations towel assist    Piriformis Stretch Left;1 rep;30 seconds    Piriformis Stretch Limitations KTOS with towel assist    Figure 4 Stretch 1 rep;30 seconds;Seated;With overpressure    Figure 4 Stretch Limitations side sitting hip hinge                    PT Education - 01/29/21 1022     Education Details PT eval findings, anticipated POC & initial HEP - Access Code: [email or web address]    Person(s) Educated Patient    Methods Explanation;Demonstration;Verbal cues;Handout    Comprehension Verbalized understanding;Verbal cues required;Returned demonstration;Need further instruction              PT Short Term Goals - 01/29/21 1022       PT SHORT TERM GOAL #1   Title Patient will be independent with initial HEP    Status New    Target Date 02/19/21               PT Long Term Goals - 01/29/21 1022       PT LONG TERM GOAL #1   Title Patient will be independent with ongoing/advanced HEP for self-management at home in order to build upon functional gains in therapy    Status New    Target Date 03/12/21      PT LONG TERM GOAL #2   Title Patient to  demonstrate improved tissue quality and pliability as noted by reduced tissue tightness and tenderness to palpation    Status New    Target Date 03/12/21      PT LONG TERM GOAL #3   Title Patient will demonstrate improved B proximal LE strength to >/= 4+/5 for improved stability and ease of mobility    Status New    Target Date 03/12/21      PT LONG TERM GOAL #4   Title Patient will report no further radicular L LE pins & needles or numbness    Status New    Target Date 03/12/21                    Plan - 01/29/21 1022  Clinical Impression Statement Xia is a 70 y/o female who presents to OP PT for L sciatica originating in July of this year w/o known MOI. Lumbar x-ray revealed multilevel degenerative changes but no apparent nerve impingement. Current deficits include L buttock soreness with pin and needles down L LE to foot and intermittent numbness in her L foot, mildly limited lumbar ROM, decreased proximal LE flexibility and mild proximal LE weakness. She notes that the pin and needles and intermittent numbness make her uncomfortable but do not yet really prevent her from doing the things she needs to do. Lyllian will benefit from skilled PT to address above deficits to reduce myofascial pain and tightness and improve core and postural strength to restore normal mobility and allow for resolution of pin and needles and numbness to allow for improved activity tolerance. Instructions provided in initial HEP for gentle lumbopelvic stretching and ROM.    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), R plantar fasciitis, hyperparathyroidism, osteopenia, obesity    Examination-Activity Limitations Sit;Transfers;Stand;Locomotion Level;Stairs    Examination-Participation Restrictions Cleaning;Community Activity;Driving     Stability/Clinical Decision Making Stable/Uncomplicated    Clinical Decision Making Low    Rehab Potential Good    PT Frequency 2x / week    PT Duration 6 weeks    PT Treatment/Interventions ADLs/Self Care Home Management;Cryotherapy;Electrical Stimulation;Iontophoresis '4mg'$ /ml Dexamethasone;Moist Heat;Traction;Ultrasound;Functional mobility training;Therapeutic activities;Therapeutic exercise;Balance training;Neuromuscular re-education;Patient/family education;Manual techniques;Passive range of motion;Dry needling;Taping;Spinal Manipulations;Joint Manipulations    PT Next Visit Plan Review initial HEP; manual therapy with possible DN to address increased muscle tension in glutes & piriformis; initiate lumbopelvic mobility and strengthening    PT Home Exercise Plan Access Code: [email or web address] (8/22)    Consulted and Agree with Plan of Care Patient             Patient will benefit from skilled therapeutic intervention in order to improve the following deficits and impairments:  Abnormal gait, Decreased activity tolerance, Decreased range of motion, Decreased strength, Increased fascial restricitons, Increased muscle spasms, Impaired flexibility, Improper body mechanics, Postural dysfunction, Pain  Visit Diagnosis: Sciatica, left side  Other muscle spasm  Other symptoms and signs involving the musculoskeletal system  Muscle weakness (generalized)     Problem List Patient Active Problem List   Diagnosis Date Noted   Rib pain on right side 01/03/2021   Sciatica of left side 01/03/2021   Hiatal hernia, small 11/21/2020   Hyperparathyroidism ([place]) 05/23/2020   Hepatic steatosis 10/08/2019   Perforation of sigmoid colon due to diverticulitis 09/24/2019   Plantar fasciitis, right 12/05/2016   Diabetes mellitus without complication ([place]) 99991111   Nodule of flexor tendon sheath 01/23/2016   Midline low back pain without sciatica 01/23/2016   Knee osteoarthritis 12/06/2014    Hyperlipidemia    Osteopenia 12/29/2013   Severe obesity (BMI >= 40) ([place]) 11/16/2012   Multinodular goiter  - stable, s/p bx - no FU needed 11/28/2011   Anemia, unspecified 08/14/2011   Vitamin D deficiency disease 08/14/2011   Kidney stone 07/03/2011   Hypertension     Percival Spanish, PT, MPT 01/29/2021, 10:58 AM  [facility] [address]  [place], [place], [address] Phone: [phone]   Fax:  [phone]  Name: Justine Nighswander MRN: [id number] Date of Birth: 21-Apr-1951

## 2021-02-04 ENCOUNTER — Other Ambulatory Visit: Payer: Self-pay | Admitting: Internal Medicine

## 2021-02-09 ENCOUNTER — Other Ambulatory Visit: Payer: Self-pay

## 2021-02-09 ENCOUNTER — Ambulatory Visit: Payer: Medicare Other | Attending: Internal Medicine | Admitting: Physical Therapy

## 2021-02-09 DIAGNOSIS — R29898 Other symptoms and signs involving the musculoskeletal system: Secondary | ICD-10-CM | POA: Diagnosis not present

## 2021-02-09 DIAGNOSIS — M6281 Muscle weakness (generalized): Secondary | ICD-10-CM | POA: Insufficient documentation

## 2021-02-09 DIAGNOSIS — M62838 Other muscle spasm: Secondary | ICD-10-CM | POA: Diagnosis not present

## 2021-02-09 DIAGNOSIS — M5432 Sciatica, left side: Secondary | ICD-10-CM | POA: Diagnosis not present

## 2021-02-09 NOTE — Therapy (Signed)
[facility] [address]  [place], [place], [address] Phone: [phone]   Fax:  [phone]  Physical Therapy Treatment  Patient Details  Name: Frances Gray MRN: [id number] Date of Birth: 07-19-1950 Referring Provider (PT): Binnie Rail, MD   Encounter Date: 02/09/2021   PT End of Session - 02/09/21 0802     Visit Number 2    Number of Visits 13    Date for PT Re-Evaluation 03/12/21    Authorization Type Medicare, [employer] [place]    PT Start Time [phone]    PT Stop Time [phone]    PT Time Calculation (min) 42 min    Activity Tolerance Patient tolerated treatment well    Behavior During Therapy [facility] for tasks assessed/performed             Past Medical History:  Diagnosis Date   Allergic rhinitis, seasonal    takes Claritin daily as needed and uses Flonase if needed   Allergy    Arthritis    L knee s/p TKR 6/14, R knee s/p TKR 6/16   Diverticulitis    GERD (gastroesophageal reflux disease)    doesn't take any meds   Heart murmur    no symptoms   History of bronchitis winter of 2014   History of colon polyps    benign   History of kidney stones    Hyperlipidemia    Hypertension    takes Hyzaar daily   Vitamin D deficiency disease 08/14/2011   takes Vit D daily    Past Surgical History:  Procedure Laterality Date   BREAST BIOPSY Left    Breast ultrasound Right 03/02/13   The 61m complex cyst in the right breast is probably benign. Follow-up in 6 months with bilateral mammogram and (R) ultrasound   Breast Ultrasound Right 09/02/13   There is a benign 3 mm complex cyst in the (R) breast at 9 o'clock   [place]   (benign)   KNEE ARTHROSCOPY     bilateral   LITHOTRIPSY  08/2011   R ureteral stone   POLYPECTOMY     TOTAL KNEE ARTHROPLASTY Left 11/16/2012   Procedure: TOTAL KNEE ARTHROPLASTY- left;  Surgeon: JJohnny Bridge MD;  [facility]   Service: Orthopedics;  Laterality: Left;   TOTAL KNEE ARTHROPLASTY Right 12/06/2014   Procedure: TOTAL KNEE ARTHROPLASTY;  Surgeon: JMarchia Bond MD;  [facility]  Service: Orthopedics;  Laterality: Right;    There were no vitals filed for this visit.   Subjective Assessment - 02/09/21 0806     Subjective Pt reports pain decreased overall and now more centralized to low back with numbness and pins and needles in leg also lessened.    Pertinent History B TKA - 2014 & 2016    Diagnostic tests 01/03/21 - Lumbar spine x-ray: Minimal grade 1 retrolisthesis of L3-4 is noted. Mild degenerative disc disease is noted at this level with anterior posterior osteophyte formation. Minimal grade 1 anterolisthesis of L4-5 is noted secondary to posterior facet joint hypertrophy. No definite fracture is noted.    Patient Stated Goals "for the numbness to go away"    Currently in Pain? Yes    Pain Score 2     Pain Location Back    Pain Orientation Lower;Left    Pain Descriptors / Indicators Sore    Pain Type Acute pain  [facility] Adult PT Treatment/Exercise - 02/09/21 0802       Exercises   Exercises Lumbar      Lumbar Exercises: Stretches   Passive Hamstring Stretch Left;Right;2 reps;30 seconds    Passive Hamstring Stretch Limitations longsitting hip hinge    Single Knee to Chest Stretch Left;Right;2 reps;30 seconds    Single Knee to Chest Stretch Limitations towel assist with opp LE straight    Piriformis Stretch Left;Right;2 reps;30 seconds    Piriformis Stretch Limitations KTOS with towel assist & opp LE straight    Figure 4 Stretch 2 reps;30 seconds;Seated;With overpressure    Figure 4 Stretch Limitations side-sitting hip hinge    Other Lumbar Stretch Exercise Seated 3-way child's pose/prayer stretch with hands resting on green Pball 2 x 30 sec      Lumbar Exercises: Aerobic   Nustep L3 x 6 min      Lumbar Exercises: Supine   Pelvic Tilt  10 reps;5 seconds    Pelvic Tilt Limitations hooklying PPT    Clam 10 reps;3 seconds    Clam Limitations TrA + alt green TB hip ABD/ER    Bridge 10 reps;5 seconds    Bridge Limitations + green TB hip ABD isometric   limited lift                   PT Education - 02/09/21 0844     Education Details HEP update - Access Code: [email or web address]    Person(s) Educated Patient    Methods Explanation;Demonstration;Verbal cues;Handout    Comprehension Verbalized understanding;Verbal cues required;Returned demonstration;Need further instruction              PT Short Term Goals - 02/09/21 0810       PT SHORT TERM GOAL #1   Title Patient will be independent with initial HEP    Status On-going    Target Date 02/19/21               PT Long Term Goals - 02/09/21 0810       PT LONG TERM GOAL #1   Title Patient will be independent with ongoing/advanced HEP for self-management at home in order to build upon functional gains in therapy    Status On-going    Target Date 03/12/21      PT LONG TERM GOAL #2   Title Patient to demonstrate improved tissue quality and pliability as noted by reduced tissue tightness and tenderness to palpation    Status On-going    Target Date 03/12/21      PT LONG TERM GOAL #3   Title Patient will demonstrate improved B proximal LE strength to >/= 4+/5 for improved stability and ease of mobility    Status On-going    Target Date 03/12/21      PT LONG TERM GOAL #4   Title Patient will report no further radicular L LE pins & needles or numbness    Status On-going    Target Date 03/12/21                   Plan - 02/09/21 0810     Clinical Impression Statement Frances Gray reports decreasing pain and radicular symptoms with apparent centralization of her pain following HEP performance. HEP reviewed with pt able to perform good return demonstration of exercises and noting benefit from stretches. Progressed stretches to include seated child's  pose/prayer stretch with pt also noting benefit. Initiated core and lumbopelvic strengthening with good muscle activation and exercises well tolerated,  therefore updated HEP to include basic strengthening.    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), R plantar fasciitis, hyperparathyroidism, osteopenia, obesity    Rehab Potential Good    PT Frequency 2x / week    PT Duration 6 weeks    PT Treatment/Interventions ADLs/Self Care Home Management;Cryotherapy;Electrical Stimulation;Iontophoresis '4mg'$ /ml Dexamethasone;Moist Heat;Traction;Ultrasound;Functional mobility training;Therapeutic activities;Therapeutic exercise;Balance training;Neuromuscular re-education;Patient/family education;Manual techniques;Passive range of motion;Dry needling;Taping;Spinal Manipulations;Joint Manipulations    PT Next Visit Plan manual therapy with possible DN to address increased muscle tension in glutes & piriformis; progress lumbopelvic mobility and strengthening    PT Home Exercise Plan Access Code: [email or web address] (8/22, updated 9/2)    Consulted and Agree with Plan of Care Patient             Patient will benefit from skilled therapeutic intervention in order to improve the following deficits and impairments:  Abnormal gait, Decreased activity tolerance, Decreased range of motion, Decreased strength, Increased fascial restricitons, Increased muscle spasms, Impaired flexibility, Improper body mechanics, Postural dysfunction, Pain  Visit Diagnosis: Sciatica, left side  Other muscle spasm  Other symptoms and signs involving the musculoskeletal system  Muscle weakness (generalized)     Problem List Patient Active Problem List   Diagnosis Date Noted   Rib pain on right side 01/03/2021   Sciatica of left side 01/03/2021   Hiatal hernia, small 11/21/2020   Hyperparathyroidism ([place]) 05/23/2020   Hepatic steatosis 10/08/2019   Perforation of sigmoid colon due to diverticulitis 09/24/2019   Plantar  fasciitis, right 12/05/2016   Diabetes mellitus without complication ([place]) [id number]   Nodule of flexor tendon sheath 01/23/2016   Midline low back pain without sciatica 01/23/2016   Knee osteoarthritis 12/06/2014   Hyperlipidemia    Osteopenia 12/29/2013   Severe obesity (BMI >= 40) ([place]) 11/16/2012   Multinodular goiter  - stable, s/p bx - no FU needed 11/28/2011   Anemia, unspecified 08/14/2011   Vitamin D deficiency disease 08/14/2011   Kidney stone 07/03/2011   Hypertension     Percival Spanish, PT, MPT 02/09/2021, 3:14 PM  [facility] [address]  [place], [place], [address] Phone: [phone]   Fax:  [phone]  Name: Frances Gray MRN: [id number] Date of Birth: 20-Oct-1950

## 2021-02-09 NOTE — Patient Instructions (Signed)
     Access Code: [email or web address] URL: [email or web address] Date: 02/09/2021 Prepared by: Annie Paras  Exercises Hooklying Single Knee to Chest Stretch with Towel - 2-3 x daily - 7 x weekly - 3 reps - 30 sec hold Supine Piriformis Stretch with Towel - 2-3 x daily - 7 x weekly - 3 reps - 30 sec hold Seated Table Piriformis Stretch - 2-3 x daily - 7 x weekly - 3 reps - 30 sec hold Seated Table Hamstring Stretch - 2-3 x daily - 7 x weekly - 3 reps - 30 sec hold Seated Flexion Stretch with Swiss Ball - 2-3 x daily - 7 x weekly - 3 reps - 30 sec hold Seated Thoracic Flexion and Rotation with Swiss Ball - 2-3 x daily - 7 x weekly - 2 sets - 3 reps - 30 sec hold Supine Posterior Pelvic Tilt - 1 x daily - 7 x weekly - 2 sets - 10 reps - 5 sec hold Hooklying Clamshell with Resistance - 1 x daily - 7 x weekly - 2 sets - 10 reps - 3-5 sec hold Supine Bridge with Resistance Band - 1 x daily - 7 x weekly - 2 sets - 10 reps - 5 sec hold

## 2021-02-15 ENCOUNTER — Encounter: Payer: Self-pay | Admitting: Internal Medicine

## 2021-02-15 NOTE — Progress Notes (Signed)
Outside notes received. Information abstracted. Notes sent to scan.  

## 2021-02-19 ENCOUNTER — Other Ambulatory Visit: Payer: Self-pay

## 2021-02-19 ENCOUNTER — Encounter: Payer: Self-pay | Admitting: Physical Therapy

## 2021-02-19 ENCOUNTER — Ambulatory Visit: Payer: Medicare Other | Admitting: Physical Therapy

## 2021-02-19 DIAGNOSIS — M62838 Other muscle spasm: Secondary | ICD-10-CM

## 2021-02-19 DIAGNOSIS — M5432 Sciatica, left side: Secondary | ICD-10-CM | POA: Diagnosis not present

## 2021-02-19 DIAGNOSIS — M6281 Muscle weakness (generalized): Secondary | ICD-10-CM | POA: Diagnosis not present

## 2021-02-19 DIAGNOSIS — R29898 Other symptoms and signs involving the musculoskeletal system: Secondary | ICD-10-CM | POA: Diagnosis not present

## 2021-02-19 NOTE — Patient Instructions (Signed)
    Access Code: [email or web address] URL: [email or web address] Date: 02/19/2021 Prepared by: Annie Paras  Exercises Hooklying Single Knee to Chest Stretch with Towel - 2-3 x daily - 7 x weekly - 3 reps - 30 sec hold Supine Piriformis Stretch with Towel - 2-3 x daily - 7 x weekly - 3 reps - 30 sec hold Seated Table Piriformis Stretch - 2-3 x daily - 7 x weekly - 3 reps - 30 sec hold Seated Table Hamstring Stretch - 2-3 x daily - 7 x weekly - 3 reps - 30 sec hold Seated Flexion Stretch with Swiss Ball - 2-3 x daily - 7 x weekly - 3 reps - 30 sec hold Seated Thoracic Flexion and Rotation with Swiss Ball - 2-3 x daily - 7 x weekly - 2 sets - 3 reps - 30 sec hold Supine Posterior Pelvic Tilt - 1 x daily - 7 x weekly - 2 sets - 10 reps - 5 sec hold Hooklying Clamshell with Resistance - 1 x daily - 7 x weekly - 2 sets - 10 reps - 3-5 sec hold Supine Bridge with Resistance Band - 1 x daily - 7 x weekly - 2 sets - 10 reps - 5 sec hold Standing Shoulder Row with Anchored Resistance - 1 x daily - 3 x weekly - 2 sets - 10 reps - 5 sec hold Hip Abduction with Resistance Loop - 1 x daily - 3 x weekly - 2 sets - 10 reps - 3 sec hold Hip Extension with Resistance Loop - 1 x daily - 3 x weekly - 2 sets - 10 reps - 3 sec hold

## 2021-02-19 NOTE — Therapy (Signed)
[facility] [address]  [place], [place], [address] Phone: [phone]   Fax:  [phone]  Physical Therapy Treatment  Patient Details  Name: Frances Gray MRN: [id number] Date of Birth: Jul 13, 1950 Referring Provider (PT): Binnie Rail, MD   Encounter Date: 02/19/2021   PT End of Session - 02/19/21 0934     Visit Number 3    Number of Visits 13    Date for PT Re-Evaluation 03/12/21    Authorization Type Medicare, [place]    PT Start Time [phone]    PT Stop Time 1018    PT Time Calculation (min) 44 min    Activity Tolerance Patient tolerated treatment well    Behavior During Therapy [facility] for tasks assessed/performed             Past Medical History:  Diagnosis Date   Allergic rhinitis, seasonal    takes Claritin daily as needed and uses Flonase if needed   Allergy    Arthritis    L knee s/p TKR 6/14, R knee s/p TKR 6/16   Diverticulitis    GERD (gastroesophageal reflux disease)    doesn't take any meds   Heart murmur    no symptoms   History of bronchitis winter of 2014   History of colon polyps    benign   History of kidney stones    Hyperlipidemia    Hypertension    takes Hyzaar daily   Vitamin D deficiency disease 08/14/2011   takes Vit D daily    Past Surgical History:  Procedure Laterality Date   BREAST BIOPSY Left    Breast ultrasound Right 03/02/13   The 20mm complex cyst in the right breast is probably benign. Follow-up in 6 months with bilateral mammogram and (R) ultrasound   Breast Ultrasound Right 09/02/13   There is a benign 3 mm complex cyst in the (R) breast at 9 o'clock   [place]   tumor removed   (benign)   KNEE ARTHROSCOPY     bilateral   LITHOTRIPSY  08/2011   R ureteral stone   POLYPECTOMY     TOTAL KNEE ARTHROPLASTY Left 11/16/2012   Procedure: TOTAL KNEE ARTHROPLASTY- left;  Surgeon: Johnny Bridge, MD;  Location: [place];   Service: Orthopedics;  Laterality: Left;   TOTAL KNEE ARTHROPLASTY Right 12/06/2014   Procedure: TOTAL KNEE ARTHROPLASTY;  Surgeon: Marchia Bond, MD;  Location: [facility];  Service: Orthopedics;  Laterality: Right;    There were no vitals filed for this visit.   Subjective Assessment - 02/19/21 0936     Subjective Pt states her muscles feel like "they must have gone back in place" - no pain currently and no pins and needles for >/= 1 week.    Pertinent History B TKA - 2014 & 2016    Diagnostic tests 01/03/21 - Lumbar spine x-ray: Minimal grade 1 retrolisthesis of L3-4 is noted. Mild degenerative disc disease is noted at this level with anterior posterior osteophyte formation. Minimal grade 1 anterolisthesis of L4-5 is noted secondary to posterior facet joint hypertrophy. No definite fracture is noted.    Patient Stated Goals "for the numbness to go away"    Currently in Pain? No/denies  [facility] Adult PT Treatment/Exercise - 02/19/21 0934       Exercises   Exercises Lumbar      Lumbar Exercises: Aerobic   Nustep L4 x 6 min (UE/LE)      Lumbar Exercises: Standing   Functional Squats 10 reps;3 seconds    Functional Squats Limitations minisquat + red TB hip ABD isometric    Row Both;10 reps;Strengthening;Theraband    Theraband Level (Row) Level 3 (Green)    Row Limitations cues for abdominal bracing      Lumbar Exercises: Seated   Other Seated Lumbar Exercises TrA isometric with hands pressing into ball on lap 10 x 5"; Oblique isometrics pressing opp hand/knee into ball on lap 10 x 5"      Knee/Hip Exercises: Standing   Hip Abduction Left;Right;10 reps;Knee straight;Stengthening    Abduction Limitations looped red TB at ankles - cues for slight extension in abduction    Hip Extension Left;Right;10 reps;Knee straight;Stengthening    Extension Limitations looped red TB at ankles                     PT Education - 02/19/21 1018      Education Details HEP update - Access Code: [email or web address]    Person(s) Educated Patient    Methods Explanation;Demonstration;Verbal cues;Handout    Comprehension Verbalized understanding;Verbal cues required;Returned demonstration;Need further instruction              PT Short Term Goals - 02/19/21 0938       PT SHORT TERM GOAL #1   Title Patient will be independent with initial HEP    Status Achieved   02/19/21              PT Long Term Goals - 02/09/21 0810       PT LONG TERM GOAL #1   Title Patient will be independent with ongoing/advanced HEP for self-management at home in order to build upon functional gains in therapy    Status On-going    Target Date 03/12/21      PT LONG TERM GOAL #2   Title Patient to demonstrate improved tissue quality and pliability as noted by reduced tissue tightness and tenderness to palpation    Status On-going    Target Date 03/12/21      PT LONG TERM GOAL #3   Title Patient will demonstrate improved B proximal LE strength to >/= 4+/5 for improved stability and ease of mobility    Status On-going    Target Date 03/12/21      PT LONG TERM GOAL #4   Title Patient will report no further radicular L LE pins & needles or numbness    Status On-going    Target Date 03/12/21                   Plan - 02/19/21 1006     Clinical Impression Statement Frances Gray reports no L buttock or LE pain currently and has not had any pins and needles into her L foot in over a week. She reports good compliance with HEP and denies need for review or update at this time - STG met.  Progressed core and proximal LE strengthening with introduction of more seated and standing exercises targeting abdominal and glute muscle activation. Cues necessary to ensure pt not holding her breath during isometrics and pt noting more fatigue with standing exercises but able to complete all exercises w/o increased pain. Pt requesting bands and HEP instructions for  standing hip and core strengthening exercises initiated today.    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), R plantar fasciitis, hyperparathyroidism, osteopenia, obesity    Rehab Potential Good    PT Frequency 2x / week    PT Duration 6 weeks    PT Treatment/Interventions ADLs/Self Care Home Management;Cryotherapy;Electrical Stimulation;Iontophoresis 4mg /ml Dexamethasone;Moist Heat;Traction;Ultrasound;Functional mobility training;Therapeutic activities;Therapeutic exercise;Balance training;Neuromuscular re-education;Patient/family education;Manual techniques;Passive range of motion;Dry needling;Taping;Spinal Manipulations;Joint Manipulations    PT Next Visit Plan progress lumbopelvic mobility and strengthening; manual therapy PRN with possible DN to address increased muscle tension in glutes & piriformis    PT Home Exercise Plan Access Code: [email or web address] (8/22, updated 9/2 & 9/12)    Consulted and Agree with Plan of Care Patient             Patient will benefit from skilled therapeutic intervention in order to improve the following deficits and impairments:  Abnormal gait, Decreased activity tolerance, Decreased range of motion, Decreased strength, Increased fascial restricitons, Increased muscle spasms, Impaired flexibility, Improper body mechanics, Postural dysfunction, Pain  Visit Diagnosis: Sciatica, left side  Other muscle spasm  Other symptoms and signs involving the musculoskeletal system  Muscle weakness (generalized)     Problem List Patient Active Problem List   Diagnosis Date Noted   Rib pain on right side 01/03/2021   Sciatica of left side 01/03/2021   Hiatal hernia, small 11/21/2020   Hyperparathyroidism ([place]) 05/23/2020   Hepatic steatosis 10/08/2019   Perforation of sigmoid colon due to diverticulitis 09/24/2019   Plantar fasciitis, right 12/05/2016   Diabetes mellitus without complication ([place]) 16/38/4536   Nodule of flexor tendon sheath 01/23/2016    Midline low back pain without sciatica 01/23/2016   Knee osteoarthritis 12/06/2014   Hyperlipidemia    Osteopenia 12/29/2013   Severe obesity (BMI >= 40) ([place]) 11/16/2012   Multinodular goiter  - stable, s/p bx - no FU needed 11/28/2011   Anemia, unspecified 08/14/2011   Vitamin D deficiency disease 08/14/2011   Kidney stone 07/03/2011   Hypertension     Percival Spanish, PT 02/19/2021, 11:23 AM  [facility] [address]  [place], [place], [address] Phone: [phone]   Fax:  [phone]  Name: Frances Gray MRN: [id number] Date of Birth: [birth year]

## 2021-02-21 ENCOUNTER — Ambulatory Visit: Payer: Medicare Other | Admitting: Physical Therapy

## 2021-02-21 ENCOUNTER — Encounter: Payer: Self-pay | Admitting: Physical Therapy

## 2021-02-21 ENCOUNTER — Other Ambulatory Visit: Payer: Self-pay

## 2021-02-21 DIAGNOSIS — M5432 Sciatica, left side: Secondary | ICD-10-CM

## 2021-02-21 DIAGNOSIS — M62838 Other muscle spasm: Secondary | ICD-10-CM | POA: Diagnosis not present

## 2021-02-21 DIAGNOSIS — M6281 Muscle weakness (generalized): Secondary | ICD-10-CM

## 2021-02-21 DIAGNOSIS — R29898 Other symptoms and signs involving the musculoskeletal system: Secondary | ICD-10-CM

## 2021-02-21 NOTE — Therapy (Signed)
[facility] [address]  [place], [place], [address] Phone: [phone]   Fax:  [phone]  Physical Therapy Treatment  Patient Details  Name: Frances Gray MRN: [id number] Date of Birth: 30-Jan-1951 Referring Provider (PT): Binnie Rail, MD   Encounter Date: 02/21/2021   PT End of Session - 02/21/21 1150     Visit Number 4    Number of Visits 13    Date for PT Re-Evaluation 03/12/21    Authorization Type Medicare, [place]    PT Start Time 1150    PT Stop Time 1230    PT Time Calculation (min) 40 min    Activity Tolerance Patient tolerated treatment well    Behavior During Therapy [facility] for tasks assessed/performed             Past Medical History:  Diagnosis Date   Allergic rhinitis, seasonal    takes Claritin daily as needed and uses Flonase if needed   Allergy    Arthritis    L knee s/p TKR 6/14, R knee s/p TKR 6/16   Diverticulitis    GERD (gastroesophageal reflux disease)    doesn't take any meds   Heart murmur    no symptoms   History of bronchitis winter of 2014   History of colon polyps    benign   History of kidney stones    Hyperlipidemia    Hypertension    takes Hyzaar daily   Vitamin D deficiency disease 08/14/2011   takes Vit D daily    Past Surgical History:  Procedure Laterality Date   BREAST BIOPSY Left    Breast ultrasound Right 03/02/13   The 34m complex cyst in the right breast is probably benign. Follow-up in 6 months with bilateral mammogram and (R) ultrasound   Breast Ultrasound Right 09/02/13   There is a benign 3 mm complex cyst in the (R) breast at 9 o'clock   [place]  tumor removed   (benign)   KNEE ARTHROSCOPY     bilateral   LITHOTRIPSY  08/2011   R ureteral stone   POLYPECTOMY     TOTAL KNEE ARTHROPLASTY Left 11/16/2012   Procedure: TOTAL KNEE ARTHROPLASTY- left;  Surgeon: JJohnny Bridge MD;  [place] [facility]   Service: Orthopedics;  Laterality: Left;   TOTAL KNEE ARTHROPLASTY Right 12/06/2014   Procedure: TOTAL KNEE ARTHROPLASTY;  Surgeon: JMarchia Bond MD;  [place]  Service: Orthopedics;  Laterality: Right;    There were no vitals filed for this visit.   Subjective Assessment - 02/21/21 1154     Subjective Pt reports mild upper lumbar/lower thoracic discomfort today but not enough to rate it on the pain scale.    Pertinent History B TKA - 2014 & 2016    Diagnostic tests 01/03/21 - Lumbar spine x-ray: Minimal grade 1 retrolisthesis of L3-4 is noted. Mild degenerative disc disease is noted at this level with anterior posterior osteophyte formation. Minimal grade 1 anterolisthesis of L4-5 is noted secondary to posterior facet joint hypertrophy. No definite fracture is noted.    Patient Stated Goals "for the numbness to go away"    Currently in Pain? No/denies                               [facility] - [facility] 1150  Lumbar Exercises: Stretches   Double Knee to Chest Stretch Limitations 10 x 5" - feet resting on green Pball    Lower Trunk Rotation Limitations 10 x 5"    Other Lumbar Stretch Exercise R/L sidelying open book stretch 10 x 5"      Lumbar Exercises: Aerobic   Nustep L4 x 6 min (UE/LE)      Lumbar Exercises: Supine   Dead Bug 10 reps;3 seconds    Dead Bug Limitations opp UE flexion/LE extension from hooklying    Isometric Hip Flexion 10 reps;5 seconds    Isometric Hip Flexion Limitations hands pressing into thighs with legs resting on green Pball    Large Ball Oblique Isometric 10 reps;5 seconds    Large Ball Oblique Isometric Limitations green Pball      Lumbar Exercises: Sidelying   Clam Right;Left;10 reps;3 seconds                       PT Short Term Goals - 02/19/21 0938       PT SHORT TERM GOAL #1   Title Patient will be independent with initial HEP    Status Achieved   02/19/21               PT Long Term Goals - 02/09/21 0810       PT LONG TERM GOAL #1   Title Patient will be independent with ongoing/advanced HEP for self-management at home in order to build upon functional gains in therapy    Status On-going    Target Date 03/12/21      PT LONG TERM GOAL #2   Title Patient to demonstrate improved tissue quality and pliability as noted by reduced tissue tightness and tenderness to palpation    Status On-going    Target Date 03/12/21      PT LONG TERM GOAL #3   Title Patient will demonstrate improved B proximal LE strength to >/= 4+/5 for improved stability and ease of mobility    Status On-going    Target Date 03/12/21      PT LONG TERM GOAL #4   Title Patient will report no further radicular L LE pins & needles or numbness    Status On-going    Target Date 03/12/21                   Plan - 02/21/21 1156     Clinical Impression Statement Kennedee reports no issues with latest HEP update or prior HEP exercises. She did report some mild thoracolumbar discomfort today which we addressed with rotational stretches and progression of core and lumbopelvic strengthening. Limited motion with rotational stretches and lower body support necessary for DKTC and hip flexion isometrics but stretches and exercises otherwise well tolerated, with only fatigue reported. Pt reported improvement in thoracolumbar discomfort by end of session.    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), R plantar fasciitis, hyperparathyroidism, osteopenia, obesity    Rehab Potential Good    PT Frequency 2x / week    PT Duration 6 weeks    PT Treatment/Interventions ADLs/Self Care Home Management;Cryotherapy;Electrical Stimulation;Iontophoresis '4mg'$ /ml Dexamethasone;Moist Heat;Traction;Ultrasound;Functional mobility training;Therapeutic activities;Therapeutic exercise;Balance training;Neuromuscular re-education;Patient/family education;Manual techniques;Passive range of motion;Dry  needling;Taping;Spinal Manipulations;Joint Manipulations    PT Next Visit Plan progress lumbopelvic mobility and strengthening; manual therapy PRN with possible DN to address increased muscle tension in glutes & piriformis    PT Home Exercise Plan Access Code: [email or web address] (8/22, updated 9/2 &  9/12)    Consulted and Agree with Plan of Care Patient             Patient will benefit from skilled therapeutic intervention in order to improve the following deficits and impairments:  Abnormal gait, Decreased activity tolerance, Decreased range of motion, Decreased strength, Increased fascial restricitons, Increased muscle spasms, Impaired flexibility, Improper body mechanics, Postural dysfunction, Pain  Visit Diagnosis: Sciatica, left side  Other muscle spasm  Other symptoms and signs involving the musculoskeletal system  Muscle weakness (generalized)     Problem List Patient Active Problem List   Diagnosis Date Noted   Rib pain on right side 01/03/2021   Sciatica of left side 01/03/2021   Hiatal hernia, small 11/21/2020   Hyperparathyroidism ([place]) 05/23/2020   Hepatic steatosis 10/08/2019   Perforation of sigmoid colon due to diverticulitis 09/24/2019   Plantar fasciitis, right 12/05/2016   Diabetes mellitus without complication ([place]) 99991111   Nodule of flexor tendon sheath 01/23/2016   Midline low back pain without sciatica 01/23/2016   Knee osteoarthritis 12/06/2014   Hyperlipidemia    Osteopenia 12/29/2013   Severe obesity (BMI >= 40) ([place]) 11/16/2012   Multinodular goiter  - stable, s/p bx - no FU needed 11/28/2011   Anemia, unspecified 08/14/2011   Vitamin D deficiency disease 08/14/2011   Kidney stone 07/03/2011   Hypertension     Percival Spanish, PT 02/21/2021, 12:35 PM  [facility] [address]  [place], [place], [address] Phone: [phone]   Fax:  [phone]  Name: Gilbert Bittenbender MRN:  [id number] Date of Birth: 1950/11/03

## 2021-02-27 ENCOUNTER — Telehealth: Payer: Self-pay | Admitting: Pharmacist

## 2021-02-27 ENCOUNTER — Encounter: Payer: Self-pay | Admitting: Physical Therapy

## 2021-02-27 ENCOUNTER — Ambulatory Visit: Payer: Medicare Other | Admitting: Physical Therapy

## 2021-02-27 ENCOUNTER — Other Ambulatory Visit: Payer: Self-pay

## 2021-02-27 DIAGNOSIS — R29898 Other symptoms and signs involving the musculoskeletal system: Secondary | ICD-10-CM

## 2021-02-27 DIAGNOSIS — M5432 Sciatica, left side: Secondary | ICD-10-CM | POA: Diagnosis not present

## 2021-02-27 DIAGNOSIS — M6281 Muscle weakness (generalized): Secondary | ICD-10-CM

## 2021-02-27 DIAGNOSIS — M62838 Other muscle spasm: Secondary | ICD-10-CM

## 2021-02-27 NOTE — Progress Notes (Signed)
Chronic Care Management Pharmacy Assistant   Name: Frances Gray  MRN: [id number] DOB: 1950-08-14   Reason for Encounter: Disease State   Conditions to be addressed/monitored: HTN   Recent office visits:  01/03/21 Frances Rail, MD (PCP) Sciatica of left side, back and chest pain, Medications changes include :   start prednisone 40 mg daily, Start celebrex after completing the prednisone and gabapentin for the nerve pain  Recent consult visits:  None ID  Hospital visits:  None in previous 6 months  Medications: Outpatient Encounter Medications as of 02/27/2021  Medication Sig Note   Biotin 10 MG TABS Take by mouth.    calcium carbonate (CALCIUM 600) 600 MG TABS tablet Take 1 tablet (600 mg total) by mouth 2 (two) times daily with a meal.    celecoxib (CELEBREX) 100 MG capsule TAKE 1 CAPSULE BY MOUTH TWICE A DAY    cetirizine (ZYRTEC) 10 MG tablet Take 10 mg by mouth daily as needed for allergies.    Cholecalciferol (VITAMIN D3) 2000 UNITS TABS Take 2,000 Units by mouth every morning.     finasteride (PROSCAR) 5 MG tablet     Fluocinolone Acetonide Scalp 0.01 % OIL Apply 1 mL topically at bedtime as needed (itchy scalp).     fluticasone (FLONASE) 50 MCG/ACT nasal spray Place 1 spray into both nostrils daily as needed for allergies or rhinitis.  11/23/2014: ...   gabapentin (NEURONTIN) 100 MG capsule Take 1 capsule (100 mg total) by mouth at bedtime.    hydroquinone 4 % cream Apply topically at bedtime.    losartan-hydrochlorothiazide (HYZAAR) 100-25 MG tablet Take 1 tablet by mouth daily.    Multiple Vitamin (MULTIVITAMIN) tablet Take 1 tablet by mouth every morning.     rosuvastatin (CRESTOR) 5 MG tablet TAKE 1 TABLET BY MOUTH TWICE WEEKLY    sennosides-docusate sodium (SENOKOT-S) 8.6-50 MG tablet Take 1 tablet by mouth at bedtime.    No facility-administered encounter medications on file as of 02/27/2021.    Recent Office Vitals: BP Readings from Last 3 Encounters:   01/03/21 140/80  11/21/20 138/70  08/15/20 130/80   Pulse Readings from Last 3 Encounters:  01/03/21 73  11/21/20 75  08/15/20 (!) 55    Wt Readings from Last 3 Encounters:  01/03/21 280 lb (127 kg)  11/21/20 276 lb (125.2 kg)  08/15/20 272 lb 6.4 oz (123.6 kg)     Kidney Function Lab Results  Component Value Date/Time   CREATININE 0.72 11/21/2020 10:09 AM   CREATININE 0.73 05/23/2020 10:34 AM   GFR 84.38 11/21/2020 10:09 AM   GFRNONAA >60 09/30/2019 01:12 AM   GFRAA >60 09/30/2019 01:12 AM    BMP Latest Ref Rng & Units 11/21/2020 05/23/2020 09/30/2019  Glucose 70 - 99 mg/dL 103(H) 99 118(H)  BUN 6 - 23 mg/dL 14 10 <5(L)  Creatinine 0.40 - 1.20 mg/dL 0.72 0.73 0.58  Sodium 135 - 145 mEq/L 137 139 137  Potassium 3.5 - 5.1 mEq/L 4.0 3.9 3.4(L)  Chloride 96 - 112 mEq/L 101 102 103  CO2 19 - 32 mEq/L 31 31 24   Calcium 8.4 - 10.5 mg/dL 9.8 10.1 8.8(L)     Contacted patient on 02/27/21 to discuss hypertension disease state  Current antihypertensive regimen:  Losartan-hctz 100-25 mg   Patient verbally confirms she is taking the above medications as directed. Yes  How often are you checking your Blood Pressure? Patient states that she does not have a cuff to check blood pressure. She  did state that she has a form from her insurance that Dr. Quay Burow needs to fill out for a blood pressure cuff but she said that she always forget the form. I told her to mail the form in so that it could be filled out and the office will fax it back to the insurance company  she checks her blood pressure in the morning before taking her medication.  Wrist or arm cuff: no cuff Caffeine intake: drink about 3 cups a week Salt intake:no salt OTC medications including pseudoephedrine or NSAIDs?no  Any readings above 180/120? No If yes any symptoms of hypertensive emergency? patient denies any symptoms of high blood pressure   What recent interventions/DTPs have been made by any provider to improve  Blood Pressure control since last CPP Visit: none noted  Any recent hospitalizations or ED visits since last visit with CPP? No  What diet changes have been made to improve Blood Pressure Control?  Patient states that she has not made any changes to her diet  What exercise is being done to improve your Blood Pressure Control?  Patient states that she does not exercise  Adherence Review: Is the patient currently on ACE/ARB medication? Yes Does the patient have >5 day gap between last estimated fill dates? No   Star Rating Drugs:  Medication:  Last Fill: Day Supply Losartan-hctz 100-25 02/19/21 90 Rosuvastatin 5 mg 02/01/21 90  Care Gaps: Annual wellness visit in last year? Yes Most Recent BP reading:140/80   No appointments scheduled within the next 30 days.   [place] Pharmacist Assistant [phone]   Time spent:42

## 2021-02-27 NOTE — Therapy (Signed)
[facility] [address]  [place], [place], [address] Phone: [phone]   Fax:  [phone]  Physical Therapy Treatment  Patient Details  Name: Frances Gray MRN: [id number] Date of Birth: 12-28-1950 Referring Provider (PT): Binnie Rail, MD   Encounter Date: 02/27/2021   PT End of Session - 02/27/21 0938     Visit Number 5    Number of Visits 13    Date for PT Re-Evaluation 03/12/21    Authorization Type Medicare, [place]    PT Start Time [phone]   Pt arrived late, then discussing billing with front office staff   PT Stop Time 1017    PT Time Calculation (min) 39 min    Activity Tolerance Patient tolerated treatment well    Behavior During Therapy [facility] for tasks assessed/performed             Past Medical History:  Diagnosis Date   Allergic rhinitis, seasonal    takes Claritin daily as needed and uses Flonase if needed   Allergy    Arthritis    L knee s/p TKR 6/14, R knee s/p TKR 6/16   Diverticulitis    GERD (gastroesophageal reflux disease)    doesn't take any meds   Heart murmur    no symptoms   History of bronchitis winter of 2014   History of colon polyps    benign   History of kidney stones    Hyperlipidemia    Hypertension    takes Hyzaar daily   Vitamin D deficiency disease 08/14/2011   takes Vit D daily    Past Surgical History:  Procedure Laterality Date   BREAST BIOPSY Left    Breast ultrasound Right 03/02/13   The 57mm complex cyst in the right breast is probably benign. Follow-up in 6 months with bilateral mammogram and (R) ultrasound   Breast Ultrasound Right 09/02/13   There is a benign 3 mm complex cyst in the (R) breast at 9 o'clock   [place]   tumor removed   (benign)   KNEE ARTHROSCOPY     bilateral   LITHOTRIPSY  08/2011   R ureteral stone   POLYPECTOMY     TOTAL KNEE ARTHROPLASTY Left 11/16/2012   Procedure: TOTAL KNEE  ARTHROPLASTY- left;  Surgeon: Johnny Bridge, MD;  Location: [facility];  Service: Orthopedics;  Laterality: Left;   TOTAL KNEE ARTHROPLASTY Right 12/06/2014   Procedure: TOTAL KNEE ARTHROPLASTY;  Surgeon: Marchia Bond, MD;  Location: [facility];  Service: Orthopedics;  Laterality: Right;    There were no vitals filed for this visit.   Subjective Assessment - 02/27/21 0946     Subjective Pt reports she has been "pretty good" since last PT visit.    Pertinent History B TKA - 2014 & 2016    Diagnostic tests 01/03/21 - Lumbar spine x-ray: Minimal grade 1 retrolisthesis of L3-4 is noted. Mild degenerative disc disease is noted at this level with anterior posterior osteophyte formation. Minimal grade 1 anterolisthesis of L4-5 is noted secondary to posterior facet joint hypertrophy. No definite fracture is noted.    Patient Stated Goals "for the numbness to go away"    Currently in Pain? No/denies                [facility] PT Assessment - 02/27/21 1007       Assessment   Medical Diagnosis L sciatica  Referring Provider (PT) Binnie Rail, MD    Onset Date/Surgical Date --   July 2022   Next MD Visit 05/23/21      Strength   Right Hip Flexion 5/5    Right Hip Extension 4/5    Right Hip External Rotation  4+/5    Right Hip Internal Rotation 5/5    Right Hip ABduction 5/5    Right Hip ADduction 5/5    Left Hip Flexion 4+/5    Left Hip Extension 4/5    Left Hip External Rotation 4+/5    Left Hip Internal Rotation 5/5    Left Hip ABduction 5/5    Left Hip ADduction 5/5    Right Knee Flexion 5/5    Right Knee Extension 5/5    Left Knee Flexion 5/5    Left Knee Extension 5/5    Right Ankle Dorsiflexion 5/5    Left Ankle Dorsiflexion 5/5                           [facility] Adult PT Treatment/Exercise - 02/27/21 0938       Lumbar Exercises: Stretches   Other Lumbar Stretch Exercise Seated B rhomboid stretch - slump with hands clapsed and shoulders protracted 3 x 30 sec       Lumbar Exercises: Aerobic   Recumbent Bike L2 x 3 min; L1 x 3 min   resistance decreased d/t pt breathing heavy with effort     Lumbar Exercises: Supine   Clam 10 reps;3 seconds    Clam Limitations TrA + alt green TB hip ABD/ER    Bridge 10 reps;5 seconds    Bridge Limitations + green TB hip ABD isometric   limited lift   Bridge with clamshell 10 reps;5 seconds    Bridge with [employer] Limitations green TB at knees   limited lift                      PT Short Term Goals - 02/19/21 0938       PT SHORT TERM GOAL #1   Title Patient will be independent with initial HEP    Status Achieved   02/19/21              PT Long Term Goals - 02/27/21 1013       PT LONG TERM GOAL #1   Title Patient will be independent with ongoing/advanced HEP for self-management at home in order to build upon functional gains in therapy    Status Partially Met    Target Date 03/12/21      PT LONG TERM GOAL #2   Title Patient to demonstrate improved tissue quality and pliability as noted by reduced tissue tightness and tenderness to palpation    Status Achieved   02/27/21     PT LONG TERM GOAL #3   Title Patient will demonstrate improved B proximal LE strength to >/= 4+/5 for improved stability and ease of mobility    Status Achieved   02/27/21     PT LONG TERM GOAL #4   Title Patient will report no further radicular L LE pins & needles or numbness    Status Achieved   02/27/21                  Plan - 02/27/21 0949     Clinical Impression Statement Frances Gray reports she is no longer having any of the buttock pain or  LE radiculopathy that initially brought her to PT, although she does note some pulling/tightness in her thoracic paraspinals and rhomboids when she flexes her neck. Provided instruction in rhomboid stretch which pt feels like addresses the pulling/tightness that she has been experiencing. MMT revealing gains in majority of LE muscle strength with only mild weakness  still evident in hip extension and ER. Review relevant strengthening exercises to further target this weakness. Frances Gray has met her STG and the majority of her LTGs - given this and the resolution of her pain and radicular symptoms, anticipate she should be able to transition to her HEP within the next visit or two following a final HEP review and update.    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), R plantar fasciitis, hyperparathyroidism, osteopenia, obesity    Rehab Potential Good    PT Frequency 2x / week    PT Duration 6 weeks    PT Treatment/Interventions ADLs/Self Care Home Management;Cryotherapy;Electrical Stimulation;Iontophoresis 4mg /ml Dexamethasone;Moist Heat;Traction;Ultrasound;Functional mobility training;Therapeutic activities;Therapeutic exercise;Balance training;Neuromuscular re-education;Patient/family education;Manual techniques;Passive range of motion;Dry needling;Taping;Spinal Manipulations;Joint Manipulations    PT Next Visit Plan HEP review & update - anticipate transition to HEP +/- 30-day hold within next 1-2 visits; progress lumbopelvic mobility and strengthening; manual therapy PRN with possible DN to address increased muscle tension in glutes & piriformis    PT Home Exercise Plan Access Code: [email or web address] (8/22, updated 9/2 & 9/12)    Consulted and Agree with Plan of Care Patient             Patient will benefit from skilled therapeutic intervention in order to improve the following deficits and impairments:  Abnormal gait, Decreased activity tolerance, Decreased range of motion, Decreased strength, Increased fascial restricitons, Increased muscle spasms, Impaired flexibility, Improper body mechanics, Postural dysfunction, Pain  Visit Diagnosis: Sciatica, left side  Other muscle spasm  Other symptoms and signs involving the musculoskeletal system  Muscle weakness (generalized)     Problem List Patient Active Problem List   Diagnosis Date Noted    Rib pain on right side 01/03/2021   Sciatica of left side 01/03/2021   Hiatal hernia, small 11/21/2020   Hyperparathyroidism ([place]) 05/23/2020   Hepatic steatosis 10/08/2019   Perforation of sigmoid colon due to diverticulitis 09/24/2019   Plantar fasciitis, right 12/05/2016   Diabetes mellitus without complication ([place]) 25/49/8264   Nodule of flexor tendon sheath 01/23/2016   Midline low back pain without sciatica 01/23/2016   Knee osteoarthritis 12/06/2014   Hyperlipidemia    Osteopenia 12/29/2013   Severe obesity (BMI >= 40) ([place]) 11/16/2012   Multinodular goiter  - stable, s/p bx - no FU needed 11/28/2011   Anemia, unspecified 08/14/2011   Vitamin D deficiency disease 08/14/2011   Kidney stone 07/03/2011   Hypertension     Percival Spanish, PT 02/27/2021, 12:40 PM  [facility] [address]  [place], [place], [address] Phone: [phone]   Fax:  [phone]  Name: Frances Gray MRN: [id number] Date of Birth: 07-27-1950

## 2021-03-06 ENCOUNTER — Other Ambulatory Visit: Payer: Self-pay | Admitting: Internal Medicine

## 2021-03-07 ENCOUNTER — Other Ambulatory Visit: Payer: Self-pay

## 2021-03-07 ENCOUNTER — Encounter: Payer: Self-pay | Admitting: Physical Therapy

## 2021-03-07 ENCOUNTER — Ambulatory Visit: Payer: Medicare Other | Admitting: Physical Therapy

## 2021-03-07 DIAGNOSIS — M6281 Muscle weakness (generalized): Secondary | ICD-10-CM

## 2021-03-07 DIAGNOSIS — M5432 Sciatica, left side: Secondary | ICD-10-CM | POA: Diagnosis not present

## 2021-03-07 DIAGNOSIS — M62838 Other muscle spasm: Secondary | ICD-10-CM

## 2021-03-07 DIAGNOSIS — R29898 Other symptoms and signs involving the musculoskeletal system: Secondary | ICD-10-CM | POA: Diagnosis not present

## 2021-03-07 NOTE — Therapy (Signed)
[facility] [address]  [address] [place], [place], [address] Phone: [phone]   Fax:  [phone]  Physical Therapy Treatment / Discharge Summary  Patient Details  Name: Frances Gray MRN: [id number] Date of Birth: 07/28/1950 Referring Provider (PT): Pincus Sanes, MD   Encounter Date: 03/07/2021   PT End of Session - 03/07/21 0935     Visit Number 6    Number of Visits 13    Date for PT Re-Evaluation 03/12/21    Authorization Type Medicare, [employer] & [place]    PT Start Time [phone]   Pt arrived late   PT Stop Time 1022    PT Time Calculation (min) 47 min    Activity Tolerance Patient tolerated treatment well    Behavior During Therapy [facility] for tasks assessed/performed             Past Medical History:  Diagnosis Date   Allergic rhinitis, seasonal    takes Claritin daily as needed and uses Flonase if needed   Allergy    Arthritis    L knee s/p TKR 6/14, R knee s/p TKR 6/16   Diverticulitis    GERD (gastroesophageal reflux disease)    doesn't take any meds   Heart murmur    no symptoms   History of bronchitis winter of 2014   History of colon polyps    benign   History of kidney stones    Hyperlipidemia    Hypertension    takes Hyzaar daily   Vitamin D deficiency disease 08/14/2011   takes Vit D daily    Past Surgical History:  Procedure Laterality Date   BREAST BIOPSY Left    Breast ultrasound Right 03/02/13   The 57mm complex cyst in the right breast is probably benign. Follow-up in 6 months with bilateral mammogram and (R) ultrasound   Breast Ultrasound Right 09/02/13   There is a benign 3 mm complex cyst in the (R) breast at 9 o'clock   COLONOSCOPY     ESOPHAGUS SURGERY  1996   tumor removed   (benign)   KNEE ARTHROSCOPY     bilateral   LITHOTRIPSY  08/2011   R ureteral stone   POLYPECTOMY     TOTAL KNEE ARTHROPLASTY Left 11/16/2012   Procedure: TOTAL KNEE ARTHROPLASTY- left;  Surgeon:  Eulas Post, MD;  Location[facility];  Service: Orthopedics;  Laterality: Left;   TOTAL KNEE ARTHROPLASTY Right 12/06/2014   Procedure: TOTAL KNEE ARTHROPLASTY;  Surgeon: Teryl Lucy, MD;  Location: [facility];  Service: Orthopedics;  Laterality: Right;    There were no vitals filed for this visit.   Subjective Assessment - 03/07/21 0938     Subjective Pt reports no numbness, tinging or pain for the past 2-3 weeks. She feels like she is doing well and could take over with her HEP.    Pertinent History B TKA - 2014 & 2016    Diagnostic tests 01/03/21 - Lumbar spine x-ray: Minimal grade 1 retrolisthesis of L3-4 is noted. Mild degenerative disc disease is noted at this level with anterior posterior osteophyte formation. Minimal grade 1 anterolisthesis of L4-5 is noted secondary to posterior facet joint hypertrophy. No definite fracture is noted.    Patient Stated Goals "for the numbness to go away"    Currently in Pain? No/denies                [facility] PT Assessment - 03/07/21 0935  Assessment   Medical Diagnosis L sciatica    Referring Provider (PT) Binnie Rail, MD    Onset Date/Surgical Date --   July 2022   Next MD Visit 05/23/21      Observation/Other Assessments   Focus on Therapeutic Outcomes (FOTO)  Hip = 91      Strength   Right Hip Flexion 5/5    Right Hip Extension 4+/5    Right Hip External Rotation  5/5    Right Hip Internal Rotation 5/5    Right Hip ABduction 5/5    Right Hip ADduction 5/5    Left Hip Flexion 5/5    Left Hip Extension 4+/5    Left Hip External Rotation 5/5    Left Hip Internal Rotation 5/5    Left Hip ABduction 5/5    Left Hip ADduction 5/5    Right Knee Flexion 5/5    Right Knee Extension 5/5    Left Knee Flexion 5/5    Left Knee Extension 5/5    Right Ankle Dorsiflexion 5/5    Left Ankle Dorsiflexion 5/5                           [facility] Adult PT Treatment/Exercise - 03/07/21 0935       Exercises   Exercises  Lumbar      Lumbar Exercises: Aerobic   Tread Mill 1.4 mph x 6 min      Lumbar Exercises: Standing   Functional Squats 10 reps;3 seconds    Functional Squats Limitations minisquat + green TB hip ABD isometric    Row Both;10 reps;Strengthening;Theraband    Theraband Level (Row) Level 3 (Green)    Row Limitations cues for abdominal bracing & scap retraction    Shoulder Extension Both;10 reps;Strengthening;Theraband    Theraband Level (Shoulder Extension) Level 3 (Green)    Shoulder Extension Limitations cues for abdominal bracing & scap retraction    Other Standing Lumbar Exercises R/L green TB pallof press x 10      Lumbar Exercises: Sidelying   Clam Left;Right;10 reps;3 seconds    Clam Limitations green TB      Knee/Hip Exercises: Standing   Hip Abduction Left;Right;10 reps;Knee straight;Stengthening    Abduction Limitations looped green TB at ankles - cues for slight extension in abduction    Hip Extension Left;Right;10 reps;Knee straight;Stengthening    Extension Limitations looped green TB at ankles                     PT Education - 03/07/21 1522     Education Details HEP review & update - Access Code: [email or web address]    Person(s) Educated Patient    Methods Explanation;Demonstration;Verbal cues;Handout    Comprehension Verbalized understanding;Verbal cues required;Returned demonstration              PT Short Term Goals - 02/19/21 0938       PT SHORT TERM GOAL #1   Title Patient will be independent with initial HEP    Status Achieved   02/19/21              PT Long Term Goals - 03/07/21 0942       PT LONG TERM GOAL #1   Title Patient will be independent with ongoing/advanced HEP for self-management at home in order to build upon functional gains in therapy    Status Achieved   03/07/21     PT LONG TERM GOAL #2  Title Patient to demonstrate improved tissue quality and pliability as noted by reduced tissue tightness and tenderness to palpation     Status Achieved   02/27/21     PT LONG TERM GOAL #3   Title Patient will demonstrate improved B proximal LE strength to >/= 4+/5 for improved stability and ease of mobility    Status Achieved   03/07/21     PT LONG TERM GOAL #4   Title Patient will report no further radicular L LE pins & needles or numbness    Status Achieved   02/27/21                  Plan - 03/07/21 0942     Clinical Impression Statement Frances Gray reports she has not had any buttock or LE radiculopathy in >/= 2-3 weeks. Her proximal LE strength has continued to improve with overall LE strength now 5/5 and only very mild weakness still noted in B hip extension at 4+/5. Her FOTO score has improved from 71 to 91, further demonstrating her progress with PT and her only limitations at this point seem to be related to her h/o B TKA. We reviewed her HEP, adding some progression of proximal LE/core strengthening and adjusting the frequencies to reflect more of a maintenance level program. All goals now met and Frances Gray feels comfortable transitioning to her HEP and proceeding with D/C from PT at this time.    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), R plantar fasciitis, hyperparathyroidism, osteopenia, obesity    Rehab Potential Good    PT Frequency --    PT Duration --    PT Treatment/Interventions ADLs/Self Care Home Management;Cryotherapy;Electrical Stimulation;Iontophoresis 4mg /ml Dexamethasone;Moist Heat;Traction;Ultrasound;Functional mobility training;Therapeutic activities;Therapeutic exercise;Balance training;Neuromuscular re-education;Patient/family education;Manual techniques;Passive range of motion;Dry needling;Taping;Spinal Manipulations;Joint Manipulations    PT Next Visit Plan Discharge with transition to HEP    PT Home Exercise Plan Access Code: [email or web address] (8/22, updated 9/2, 9/12 & 9/28)    Consulted and Agree with Plan of Care Patient             Patient will benefit from skilled therapeutic  intervention in order to improve the following deficits and impairments:  Abnormal gait, Decreased activity tolerance, Decreased range of motion, Decreased strength, Increased fascial restricitons, Increased muscle spasms, Impaired flexibility, Improper body mechanics, Postural dysfunction, Pain  Visit Diagnosis: Sciatica, left side  Other muscle spasm  Other symptoms and signs involving the musculoskeletal system  Muscle weakness (generalized)     Problem List Patient Active Problem List   Diagnosis Date Noted   Rib pain on right side 01/03/2021   Sciatica of left side 01/03/2021   Hiatal hernia, small 11/21/2020   Hyperparathyroidism ([place]) 05/23/2020   Hepatic steatosis 10/08/2019   Perforation of sigmoid colon due to diverticulitis 09/24/2019   Plantar fasciitis, right 12/05/2016   Diabetes mellitus without complication ([place]) 97/94/8016   Nodule of flexor tendon sheath 01/23/2016   Midline low back pain without sciatica 01/23/2016   Knee osteoarthritis 12/06/2014   Hyperlipidemia    Osteopenia 12/29/2013   Severe obesity (BMI >= 40) ([place]) 11/16/2012   Multinodular goiter  - stable, s/p bx - no FU needed 11/28/2011   Anemia, unspecified 08/14/2011   Vitamin D deficiency disease 08/14/2011   Kidney stone 07/03/2011   Hypertension     PHYSICAL THERAPY DISCHARGE SUMMARY  Visits from Start of Care: 6  Current functional level related to goals / functional outcomes:    Refer to above clinical  impression.   Remaining deficits:    As above.   Education / Equipment:    HEP   Patient agrees to discharge. Patient goals were met. Patient is being discharged due to meeting the stated rehab goals.   Percival Spanish, PT 03/07/2021, 3:34 PM  [facility] [address]  [place], [place], [address] Phone: [phone]   Fax:  [phone]  Name: Frances Gray MRN: [id number] Date of Birth: 07-04-1950

## 2021-03-07 NOTE — Patient Instructions (Signed)
New exercises:       Access Code: [email or web address] URL: [email or web address] Date: 03/07/2021 Prepared by: Annie Paras  Exercises Hooklying Single Knee to Chest Stretch with Towel - 1 x daily - 5-7 x weekly - 3 reps - 30 sec hold Supine Piriformis Stretch with Towel - 1 x daily - 5-7 x weekly - 3 reps - 30 sec hold Seated Table Piriformis Stretch - 1 x daily - 5-7 x weekly - 3 reps - 30 sec hold Seated Table Hamstring Stretch - 1 x daily - 5-7 x weekly - 3 reps - 30 sec hold Seated Flexion Stretch with Swiss Ball - 1 x daily - 5-7 x weekly - 3 reps - 30 sec hold Seated Thoracic Flexion and Rotation with Swiss Ball - 1 x daily - 5-7 x weekly - 2 sets - 3 reps - 30 sec hold Supine Posterior Pelvic Tilt - 1 x daily - 3 x weekly - 2 sets - 10 reps - 5 sec hold Hooklying Clamshell with Resistance - 1 x daily - 3 x weekly - 2 sets - 10 reps - 3-5 sec hold Supine Bridge with Resistance Band - 1 x daily - 3 x weekly - 2 sets - 10 reps - 5 sec hold Clamshell with Resistance - 1 x daily - 3 x weekly - 2 sets - 10 reps - 3-5 sec hold Hip Abduction with Resistance Loop - 1 x daily - 3 x weekly - 2 sets - 10 reps - 3 sec hold Hip Extension with Resistance Loop - 1 x daily - 3 x weekly - 2 sets - 10 reps - 3 sec hold Isometric Squat with Resistance Loop - 1 x daily - 3 x weekly - 2 sets - 10 reps - 3 sec hold Standing Shoulder Row with Anchored Resistance - 1 x daily - 3 x weekly - 2 sets - 10 reps - 5 sec hold Scapular Retraction with Resistance Advanced - 1 x daily - 3 x weekly - 2 sets - 10 reps - 5 sec hold Standing Anti-Rotation Press with Anchored Resistance - 1 x daily - 3 x weekly - 2 sets - 10 reps - 3 sec hold

## 2021-03-13 ENCOUNTER — Encounter: Payer: Medicare Other | Admitting: Physical Therapy

## 2021-03-16 ENCOUNTER — Encounter: Payer: Medicare Other | Admitting: Physical Therapy

## 2021-04-20 ENCOUNTER — Other Ambulatory Visit: Payer: Self-pay | Admitting: Internal Medicine

## 2021-05-08 DIAGNOSIS — Z23 Encounter for immunization: Secondary | ICD-10-CM | POA: Diagnosis not present

## 2021-05-16 DIAGNOSIS — Z79899 Other long term (current) drug therapy: Secondary | ICD-10-CM | POA: Diagnosis not present

## 2021-05-16 DIAGNOSIS — D1801 Hemangioma of skin and subcutaneous tissue: Secondary | ICD-10-CM | POA: Insufficient documentation

## 2021-05-16 DIAGNOSIS — L661 Lichen planopilaris: Secondary | ICD-10-CM | POA: Diagnosis not present

## 2021-05-16 DIAGNOSIS — L658 Other specified nonscarring hair loss: Secondary | ICD-10-CM | POA: Diagnosis not present

## 2021-05-16 DIAGNOSIS — L811 Chloasma: Secondary | ICD-10-CM | POA: Diagnosis not present

## 2021-05-22 NOTE — Progress Notes (Signed)
Subjective:    Patient ID: Frances Gray, female    DOB: [birth year], 70 y.o.   MRN: [id number]  This visit occurred during the SARS-CoV-2 public health emergency.  Safety protocols were in place, including screening questions prior to the visit, additional usage of staff PPE, and extensive cleaning of exam room while observing appropriate contact time as indicated for disinfecting solutions.     HPI The patient is here for follow up of their chronic medical problems, including dm, htn, hld, goiter  Last week she had some GI issues - she had to change her diet and things got better.  She was concerned she may have had diverticulitis again.  She has started walking.  She does it at least 3 times a week.   Medications and allergies reviewed with patient and updated if appropriate.  Patient Active Problem List   Diagnosis Date Noted   Rib pain on right side 01/03/2021   Sciatica of left side 01/03/2021   Hiatal hernia, small 11/21/2020   Hyperparathyroidism ([place]) 05/23/2020   Hepatic steatosis 10/08/2019   Perforation of sigmoid colon due to diverticulitis 09/24/2019   Plantar fasciitis, right 12/05/2016   Diabetes mellitus without complication ([place]) 47/82/9562   Nodule of flexor tendon sheath 01/23/2016   Midline low back pain without sciatica 01/23/2016   Knee osteoarthritis 12/06/2014   Hyperlipidemia    Osteopenia 12/29/2013   Severe obesity (BMI >= 40) ([place]) 11/16/2012   Multinodular goiter  - stable, s/p bx - no FU needed 11/28/2011   Vitamin D deficiency disease 08/14/2011   Kidney stone 07/03/2011   Hypertension     Current Outpatient Medications on File Prior to Visit  Medication Sig Dispense Refill   Biotin 10 MG TABS Take by mouth.     calcium carbonate (CALCIUM 600) 600 MG TABS tablet Take 1 tablet (600 mg total) by mouth 2 (two) times daily with a meal. 60 tablet 6   cetirizine (ZYRTEC) 10 MG tablet Take 10 mg by mouth daily as needed for allergies.      Cholecalciferol (VITAMIN D3) 2000 UNITS TABS Take 2,000 Units by mouth every morning.      Cyanocobalamin (VITAMIN B 12 PO) Take by mouth.     finasteride (PROSCAR) 5 MG tablet      Fluocinolone Acetonide Scalp 0.01 % OIL Apply 1 mL topically at bedtime as needed (itchy scalp).      fluticasone (FLONASE) 50 MCG/ACT nasal spray Place 1 spray into both nostrils daily as needed for allergies or rhinitis.   0   hydroquinone 4 % cream Apply topically at bedtime.     losartan-hydrochlorothiazide (HYZAAR) 100-25 MG tablet Take 1 tablet by mouth daily. 90 tablet 1   Multiple Vitamin (MULTIVITAMIN) tablet Take 1 tablet by mouth every morning.      Multiple Vitamins-Minerals (ZINC PO) Take by mouth.     rosuvastatin (CRESTOR) 5 MG tablet TAKE 1 TABLET BY MOUTH TWICE WEEKLY 24 tablet 1   sennosides-docusate sodium (SENOKOT-S) 8.6-50 MG tablet Take 1 tablet by mouth at bedtime.     No current facility-administered medications on file prior to visit.    Past Medical History:  Diagnosis Date   Allergic rhinitis, seasonal    takes Claritin daily as needed and uses Flonase if needed   Allergy    Arthritis    L knee s/p TKR 6/14, R knee s/p TKR 6/16   Diverticulitis    GERD (gastroesophageal reflux disease)  doesn't take any meds   Heart murmur    no symptoms   History of bronchitis winter of 2014   History of colon polyps    benign   History of kidney stones    Hyperlipidemia    Hypertension    takes Hyzaar daily   Vitamin D deficiency disease 08/14/2011   takes Vit D daily    Past Surgical History:  Procedure Laterality Date   BREAST BIOPSY Left    Breast ultrasound Right 03/02/13   The 6mm complex cyst in the right breast is probably benign. Follow-up in 6 months with bilateral mammogram and (R) ultrasound   Breast Ultrasound Right 09/02/13   There is a benign 3 mm complex cyst in the (R) breast at 9 o'clock   [place]   tumor removed   (benign)   KNEE  ARTHROSCOPY     bilateral   LITHOTRIPSY  08/2011   R ureteral stone   POLYPECTOMY     TOTAL KNEE ARTHROPLASTY Left 11/16/2012   Procedure: TOTAL KNEE ARTHROPLASTY- left;  Surgeon: Johnny Bridge, MD;  Location: [place];  Service: Orthopedics;  Laterality: Left;   TOTAL KNEE ARTHROPLASTY Right 12/06/2014   Procedure: TOTAL KNEE ARTHROPLASTY;  Surgeon: Marchia Bond, MD;  Location: [place];  Service: Orthopedics;  Laterality: Right;    Social History   Socioeconomic History   Marital status: Married    Spouse name: Not on file   Number of children: Not on file   Years of education: Not on file   Highest education level: Not on file  Occupational History   Occupation: retired  Tobacco Use   Smoking status: Former    Packs/day: 0.25    Years: 10.00    Pack years: 2.50    Types: Cigarettes   Smokeless tobacco: Never   Tobacco comments:    quit smoking 54yrs ago  Vaping Use   Vaping Use: Never used  Substance and Sexual Activity   Alcohol use: No   Drug use: No   Sexual activity: Yes    Birth control/protection: Post-menopausal  Other Topics Concern   Not on file  Social History Narrative   Lives with spouse and g-son, temporarily mother in law (05/2014)   Social Determinants of Health   Financial Resource Strain: Low Risk    Difficulty of Paying Living Expenses: Not hard at all  Food Insecurity: No Food Insecurity   Worried About Charity fundraiser in the Last Year: Never true   [place] in the Last Year: Never true  Transportation Needs: No Transportation Needs   Lack of Transportation (Medical): No   Lack of Transportation (Non-Medical): No  Physical Activity: Inactive   Days of Exercise per Week: 0 days   Minutes of Exercise per Session: 0 min  Stress: No Stress Concern Present   Feeling of Stress : Not at all  Social Connections: Socially Integrated   Frequency of Communication with Friends and Family: More than three times a week   Frequency of Social  Gatherings with Friends and Family: More than three times a week   Attends Religious Services: More than 4 times per year   Active Member of [employer] or Organizations: Yes   Attends Music therapist: More than 4 times per year   Marital Status: Married    Family History  Problem Relation Age of Onset   Ovarian cancer Mother    Colon  cancer Sister 88   Breast cancer Sister    Esophageal cancer Neg Hx    Colon polyps Neg Hx    Rectal cancer Neg Hx    Stomach cancer Neg Hx     Review of Systems  Constitutional:  Negative for chills and fever.  Respiratory:  Negative for cough, shortness of breath and wheezing.   Cardiovascular:  Negative for chest pain, palpitations and leg swelling.  Neurological:  Positive for light-headedness (if gets up quickly). Negative for headaches.      Objective:   Vitals:   05/23/21 0935  BP: 140/78  Pulse: (!) 55  Temp: 98.3 F (36.8 C)  SpO2: 99%   BP Readings from Last 3 Encounters:  05/23/21 140/78  01/03/21 140/80  11/21/20 138/70   Wt Readings from Last 3 Encounters:  05/23/21 278 lb 9.6 oz (126.4 kg)  01/03/21 280 lb (127 kg)  11/21/20 276 lb (125.2 kg)   Body mass index is 47.82 kg/m.   Physical Exam    Constitutional: Appears well-developed and well-nourished. No distress.  HENT:  Head: Normocephalic and atraumatic.  Neck: Neck supple. No tracheal deviation present. No thyromegaly present.  No cervical lymphadenopathy Cardiovascular: Normal rate, regular rhythm and normal heart sounds.   No murmur heard. No carotid bruit .  No edema Pulmonary/Chest: Effort normal and breath sounds normal. No respiratory distress. No has no wheezes. No rales.  Skin: Skin is warm and dry. Not diaphoretic.  Psychiatric: Normal mood and affect. Behavior is normal.      Assessment & Plan:    See Problem List for Assessment and Plan of chronic medical problems.

## 2021-05-22 NOTE — Patient Instructions (Addendum)
    Blood work was ordered.      Medications changes include :   none     Please followup in 6 months  

## 2021-05-23 ENCOUNTER — Other Ambulatory Visit: Payer: Self-pay

## 2021-05-23 ENCOUNTER — Encounter: Payer: Self-pay | Admitting: Internal Medicine

## 2021-05-23 ENCOUNTER — Ambulatory Visit (INDEPENDENT_AMBULATORY_CARE_PROVIDER_SITE_OTHER): Payer: Medicare Other | Admitting: Internal Medicine

## 2021-05-23 VITALS — BP 140/78 | HR 55 | Temp 98.3°F | Ht 64.0 in | Wt 278.6 lb

## 2021-05-23 DIAGNOSIS — E7849 Other hyperlipidemia: Secondary | ICD-10-CM

## 2021-05-23 DIAGNOSIS — I10 Essential (primary) hypertension: Secondary | ICD-10-CM

## 2021-05-23 DIAGNOSIS — E119 Type 2 diabetes mellitus without complications: Secondary | ICD-10-CM

## 2021-05-23 LAB — COMPREHENSIVE METABOLIC PANEL
ALT: 19 U/L (ref 0–35)
AST: 21 U/L (ref 0–37)
Albumin: 3.7 g/dL (ref 3.5–5.2)
Alkaline Phosphatase: 61 U/L (ref 39–117)
BUN: 12 mg/dL (ref 6–23)
CO2: 32 mEq/L (ref 19–32)
Calcium: 9.8 mg/dL (ref 8.4–10.5)
Chloride: 95 mEq/L — ABNORMAL LOW (ref 96–112)
Creatinine, Ser: 0.66 mg/dL (ref 0.40–1.20)
GFR: 88.22 mL/min (ref >60.00)
Glucose, Bld: 99 mg/dL (ref 70–99)
Potassium: 3.6 mEq/L (ref 3.5–5.1)
Sodium: 128 mEq/L — ABNORMAL LOW (ref 135–145)
Total Bilirubin: 0.4 mg/dL (ref 0.2–1.2)
Total Protein: 8 g/dL (ref 6.0–8.3)

## 2021-05-23 LAB — CBC WITH DIFFERENTIAL/PLATELET
Basophils Absolute: 0 10*3/uL (ref 0.0–0.1)
Basophils Relative: 0.6 % (ref 0.0–3.0)
Eosinophils Absolute: 0.3 10*3/uL (ref 0.0–0.7)
Eosinophils Relative: 5.2 % — ABNORMAL HIGH (ref 0.0–5.0)
HCT: 39.8 % (ref 36.0–46.0)
Hemoglobin: 13 g/dL (ref 12.0–15.0)
Lymphocytes Relative: 27.6 % (ref 12.0–46.0)
Lymphs Abs: 1.6 10*3/uL (ref 0.7–4.0)
MCHC: 32.7 g/dL (ref 30.0–36.0)
MCV: 85.7 fl (ref 78.0–100.0)
Monocytes Absolute: 0.8 10*3/uL (ref 0.1–1.0)
Monocytes Relative: 13 % — ABNORMAL HIGH (ref 3.0–12.0)
Neutro Abs: 3.1 10*3/uL (ref 1.4–7.7)
Neutrophils Relative %: 53.6 % (ref 43.0–77.0)
Platelets: 401 10*3/uL — ABNORMAL HIGH (ref 150.0–400.0)
RBC: 4.64 Mil/uL (ref 3.87–5.11)
RDW: 13 % (ref 11.5–15.5)
WBC: 5.8 10*3/uL (ref 4.0–10.5)

## 2021-05-23 LAB — LIPID PANEL
Cholesterol: 171 mg/dL (ref 0–200)
HDL: 54.2 mg/dL (ref >39.00)
LDL Cholesterol: 95 mg/dL (ref 0–99)
NonHDL: 116.52
Total CHOL/HDL Ratio: 3
Triglycerides: 108 mg/dL (ref 0.0–149.0)
VLDL: 21.6 mg/dL (ref 0.0–40.0)

## 2021-05-23 LAB — HEMOGLOBIN A1C: Hgb A1c MFr Bld: 7.1 % — ABNORMAL HIGH (ref 4.6–6.5)

## 2021-05-23 NOTE — Assessment & Plan Note (Signed)
Chronic Check lipid panel  Continue crestor 5 mg twice a week Regular exercise and healthy diet encouraged

## 2021-05-23 NOTE — Assessment & Plan Note (Signed)
Chronic BP well controlled Continue hyzaar 100-25 mg daily cmp  

## 2021-05-23 NOTE — Assessment & Plan Note (Addendum)
Chronic Diet controlled Check a1c' Lab Results  Component Value Date   HGBA1C 6.7 (H) 11/21/2020   Stressed continuing regular exercise

## 2021-05-25 ENCOUNTER — Telehealth: Payer: Self-pay

## 2021-05-25 NOTE — Telephone Encounter (Signed)
Form filled out and faxed on yesterday.  Copy emailed to patient this morning.  Form placed in box for scan.

## 2021-06-02 ENCOUNTER — Other Ambulatory Visit: Payer: Self-pay | Admitting: Internal Medicine

## 2021-06-11 ENCOUNTER — Encounter (HOSPITAL_BASED_OUTPATIENT_CLINIC_OR_DEPARTMENT_OTHER): Payer: Self-pay

## 2021-06-11 ENCOUNTER — Inpatient Hospital Stay (HOSPITAL_BASED_OUTPATIENT_CLINIC_OR_DEPARTMENT_OTHER)
Admission: EM | Admit: 2021-06-11 | Discharge: 2021-06-14 | DRG: 391 | Disposition: A | Discharge status: Home / Self Care | Payer: Medicare Other | Attending: Surgery | Admitting: Surgery

## 2021-06-11 ENCOUNTER — Emergency Department (HOSPITAL_BASED_OUTPATIENT_CLINIC_OR_DEPARTMENT_OTHER): Payer: Medicare Other

## 2021-06-11 ENCOUNTER — Other Ambulatory Visit: Payer: Self-pay

## 2021-06-11 DIAGNOSIS — Z888 Allergy status to other drugs, medicaments and biological substances status: Secondary | ICD-10-CM | POA: Diagnosis not present

## 2021-06-11 DIAGNOSIS — Z20822 Contact with and (suspected) exposure to covid-19: Secondary | ICD-10-CM | POA: Diagnosis not present

## 2021-06-11 DIAGNOSIS — I10 Essential (primary) hypertension: Secondary | ICD-10-CM | POA: Diagnosis present

## 2021-06-11 DIAGNOSIS — Z87891 Personal history of nicotine dependence: Secondary | ICD-10-CM | POA: Diagnosis not present

## 2021-06-11 DIAGNOSIS — K5730 Diverticulosis of large intestine without perforation or abscess without bleeding: Secondary | ICD-10-CM | POA: Diagnosis not present

## 2021-06-11 DIAGNOSIS — J309 Allergic rhinitis, unspecified: Secondary | ICD-10-CM | POA: Diagnosis present

## 2021-06-11 DIAGNOSIS — K651 Peritoneal abscess: Secondary | ICD-10-CM | POA: Diagnosis present

## 2021-06-11 DIAGNOSIS — K5720 Diverticulitis of large intestine with perforation and abscess without bleeding: Principal | ICD-10-CM | POA: Diagnosis present

## 2021-06-11 DIAGNOSIS — Z87442 Personal history of urinary calculi: Secondary | ICD-10-CM

## 2021-06-11 DIAGNOSIS — E559 Vitamin D deficiency, unspecified: Secondary | ICD-10-CM | POA: Diagnosis not present

## 2021-06-11 DIAGNOSIS — E785 Hyperlipidemia, unspecified: Secondary | ICD-10-CM | POA: Diagnosis not present

## 2021-06-11 DIAGNOSIS — K429 Umbilical hernia without obstruction or gangrene: Secondary | ICD-10-CM | POA: Diagnosis not present

## 2021-06-11 DIAGNOSIS — Z6841 Body Mass Index (BMI) 40.0 and over, adult: Secondary | ICD-10-CM

## 2021-06-11 DIAGNOSIS — Z79899 Other long term (current) drug therapy: Secondary | ICD-10-CM

## 2021-06-11 DIAGNOSIS — K4090 Unilateral inguinal hernia, without obstruction or gangrene, not specified as recurrent: Secondary | ICD-10-CM | POA: Diagnosis not present

## 2021-06-11 DIAGNOSIS — K529 Noninfective gastroenteritis and colitis, unspecified: Secondary | ICD-10-CM | POA: Diagnosis not present

## 2021-06-11 DIAGNOSIS — K631 Perforation of intestine (nontraumatic): Secondary | ICD-10-CM

## 2021-06-11 LAB — URINALYSIS, ROUTINE W REFLEX MICROSCOPIC
Bilirubin Urine: NEGATIVE
Glucose, UA: NEGATIVE mg/dL
Hgb urine dipstick: NEGATIVE
Ketones, ur: NEGATIVE mg/dL
Leukocytes,Ua: NEGATIVE
Nitrite: NEGATIVE
Protein, ur: NEGATIVE mg/dL
Specific Gravity, Urine: 1.025 (ref 1.005–1.030)
pH: 7 (ref 5.0–8.0)

## 2021-06-11 LAB — CBC WITH DIFFERENTIAL/PLATELET
Abs Immature Granulocytes: 0.05 10*3/uL (ref 0.00–0.07)
Basophils Absolute: 0 10*3/uL (ref 0.0–0.1)
Basophils Relative: 0 %
Eosinophils Absolute: 0.1 10*3/uL (ref 0.0–0.5)
Eosinophils Relative: 1 %
HCT: 39.5 % (ref 36.0–46.0)
Hemoglobin: 13.2 g/dL (ref 12.0–15.0)
Immature Granulocytes: 0 %
Lymphocytes Relative: 6 %
Lymphs Abs: 0.7 10*3/uL (ref 0.7–4.0)
MCH: 28.3 pg (ref 26.0–34.0)
MCHC: 33.4 g/dL (ref 30.0–36.0)
MCV: 84.8 fL (ref 80.0–100.0)
Monocytes Absolute: 0.7 10*3/uL (ref 0.1–1.0)
Monocytes Relative: 6 %
Neutro Abs: 10.8 10*3/uL — ABNORMAL HIGH (ref 1.7–7.7)
Neutrophils Relative %: 87 %
Platelets: 300 10*3/uL (ref 150–400)
RBC: 4.66 MIL/uL (ref 3.87–5.11)
RDW: 13.2 % (ref 11.5–15.5)
WBC: 12.3 10*3/uL — ABNORMAL HIGH (ref 4.0–10.5)
nRBC: 0 % (ref 0.0–0.2)

## 2021-06-11 LAB — COMPREHENSIVE METABOLIC PANEL
ALT: 19 U/L (ref 0–44)
AST: 19 U/L (ref 15–41)
Albumin: 3.6 g/dL (ref 3.5–5.0)
Alkaline Phosphatase: 65 U/L (ref 38–126)
Anion gap: 11 (ref 5–15)
BUN: 13 mg/dL (ref 8–23)
CO2: 26 mmol/L (ref 22–32)
Calcium: 9.5 mg/dL (ref 8.9–10.3)
Chloride: 95 mmol/L — ABNORMAL LOW (ref 98–111)
Creatinine, Ser: 0.75 mg/dL (ref 0.44–1.00)
GFR, Estimated: >60 mL/min (ref >60)
Glucose, Bld: 160 mg/dL — ABNORMAL HIGH (ref 70–99)
Potassium: 3.3 mmol/L — ABNORMAL LOW (ref 3.5–5.1)
Sodium: 132 mmol/L — ABNORMAL LOW (ref 135–145)
Total Bilirubin: 0.6 mg/dL (ref 0.3–1.2)
Total Protein: 8.4 g/dL — ABNORMAL HIGH (ref 6.5–8.1)

## 2021-06-11 LAB — LACTIC ACID, PLASMA: Lactic Acid, Venous: 1.1 mmol/L (ref 0.5–1.9)

## 2021-06-11 LAB — RESP PANEL BY RT-PCR (FLU A&B, COVID) ARPGX2
Influenza A by PCR: NEGATIVE
Influenza B by PCR: NEGATIVE
SARS Coronavirus 2 by RT PCR: NEGATIVE

## 2021-06-11 LAB — LIPASE, BLOOD: Lipase: 31 U/L (ref 11–51)

## 2021-06-11 MED ORDER — FENTANYL CITRATE PF 50 MCG/ML IJ SOSY
50.0000 ug | PREFILLED_SYRINGE | Freq: Once | INTRAMUSCULAR | Status: AC
  Administered 2021-06-11: 50 ug via INTRAVENOUS
  Filled 2021-06-11: qty 1

## 2021-06-11 MED ORDER — HYDROMORPHONE HCL 1 MG/ML IJ SOLN: 0.5000 mg | INTRAMUSCULAR | Status: DC | PRN

## 2021-06-11 MED ORDER — ONDANSETRON HCL 4 MG/2ML IJ SOLN
4.0000 mg | Freq: Once | INTRAMUSCULAR | Status: AC
  Administered 2021-06-11: 4 mg via INTRAVENOUS
  Filled 2021-06-11: qty 2

## 2021-06-11 MED ORDER — ONDANSETRON 4 MG PO TBDP: 4.0000 mg | ORAL_TABLET | Freq: Four times a day (QID) | ORAL | Status: DC | PRN

## 2021-06-11 MED ORDER — SODIUM CHLORIDE 0.9 % IV SOLN
2.0000 g | INTRAVENOUS | Status: DC
  Administered 2021-06-11 – 2021-06-14 (×4): 2 g via INTRAVENOUS
  Filled 2021-06-11 (×4): qty 20

## 2021-06-11 MED ORDER — DIPHENHYDRAMINE HCL 50 MG/ML IJ SOLN: 12.5000 mg | Freq: Four times a day (QID) | INTRAMUSCULAR | Status: DC | PRN

## 2021-06-11 MED ORDER — PIPERACILLIN-TAZOBACTAM 3.375 G IVPB: 3.3750 g | Freq: Three times a day (TID) | INTRAVENOUS | Status: DC

## 2021-06-11 MED ORDER — ONDANSETRON HCL 4 MG/2ML IJ SOLN
4.0000 mg | Freq: Four times a day (QID) | INTRAMUSCULAR | Status: DC | PRN
  Administered 2021-06-11: 4 mg via INTRAVENOUS
  Filled 2021-06-11: qty 2

## 2021-06-11 MED ORDER — LACTATED RINGERS IV BOLUS
1000.0000 mL | Freq: Once | INTRAVENOUS | Status: AC
  Administered 2021-06-11: 1000 mL via INTRAVENOUS

## 2021-06-11 MED ORDER — ROSUVASTATIN CALCIUM 5 MG PO TABS
5.0000 mg | ORAL_TABLET | ORAL | Status: DC
  Administered 2021-06-11: 5 mg via ORAL
  Filled 2021-06-11: qty 1

## 2021-06-11 MED ORDER — METRONIDAZOLE 500 MG/100ML IV SOLN
500.0000 mg | Freq: Two times a day (BID) | INTRAVENOUS | Status: DC
  Administered 2021-06-11 – 2021-06-14 (×7): 500 mg via INTRAVENOUS
  Filled 2021-06-11 (×7): qty 100

## 2021-06-11 MED ORDER — ENOXAPARIN SODIUM 40 MG/0.4ML IJ SOSY
40.0000 mg | PREFILLED_SYRINGE | INTRAMUSCULAR | Status: DC
  Administered 2021-06-11 – 2021-06-14 (×4): 40 mg via SUBCUTANEOUS
  Filled 2021-06-11 (×4): qty 0.4

## 2021-06-11 MED ORDER — SODIUM CHLORIDE 0.9 % IV BOLUS
1000.0000 mL | Freq: Once | INTRAVENOUS | Status: AC
  Administered 2021-06-11: 1000 mL via INTRAVENOUS

## 2021-06-11 MED ORDER — DIPHENHYDRAMINE HCL 12.5 MG/5ML PO ELIX: 12.5000 mg | ORAL_SOLUTION | Freq: Four times a day (QID) | ORAL | Status: DC | PRN

## 2021-06-11 MED ORDER — IOHEXOL 300 MG/ML  SOLN
100.0000 mL | Freq: Once | INTRAMUSCULAR | Status: AC | PRN
  Administered 2021-06-11: 100 mL via INTRAVENOUS

## 2021-06-11 MED ORDER — OXYCODONE HCL 5 MG PO TABS
5.0000 mg | ORAL_TABLET | ORAL | Status: DC | PRN
  Administered 2021-06-11 – 2021-06-12 (×4): 10 mg via ORAL
  Administered 2021-06-13 (×2): 5 mg via ORAL
  Filled 2021-06-11 (×2): qty 2
  Filled 2021-06-11: qty 1
  Filled 2021-06-11 (×2): qty 2
  Filled 2021-06-11: qty 1

## 2021-06-11 MED ORDER — PIPERACILLIN-TAZOBACTAM 3.375 G IVPB
3.3750 g | Freq: Once | INTRAVENOUS | Status: AC
  Administered 2021-06-11: 3.375 g via INTRAVENOUS
  Filled 2021-06-11: qty 50

## 2021-06-11 MED ORDER — SODIUM CHLORIDE 0.9 % IV SOLN: INTRAVENOUS | Status: DC

## 2021-06-11 NOTE — ED Provider Notes (Signed)
9:25 AM Patient transferred from [facility] for general surgery valuation.  Patient reports pain, nausea and vomiting for the last few days and imaging was found to show suspected perforated diverticulitis with abscess formation.  Plan of care is to transfer for general surgery to see and discuss disposition.  Patient had antibiotics ordered and upon arrival reports her pain is now back to an 8 out of 10 in severity.  We will give some oral pain medicine and nausea medicine.  Will call general surgery for evaluation promptly.   10:44 AM Dr. Michaelle Birks with general surgery came and saw the patient and they will admit for further management.  Clinical Impression: 1. Perforated sigmoid colon ([place])   2. Intra-abdominal abscess ([place])     Disposition: Admit  This note was prepared with assistance of Dragon voice recognition software. Occasional wrong-word or sound-a-like substitutions may have occurred due to the inherent limitations of voice recognition software.       Elliana Bal, Gwenyth Allegra, MD 06/11/21 1044

## 2021-06-11 NOTE — H&P (Signed)
Frances Gray 11-18-1950  [id number].    Requesting MD: Dr. Sherry Ruffing Chief Complaint/Reason for Consult: Perforated diverticulitis  HPI:  Frances Gray is a 71 year old female who presented to the ED with several days of abdominal pain.  The pain is primarily in the left lower quadrant and began 3 days ago.  It is associated with nausea and vomiting.  She has not had any diarrhea or blood in her stools. WBC is mildly elevated at 12.  CT scan showed sigmoid diverticulitis with a surrounding phlegmon.  General surgery was consulted.  The patient previously had an episode of acute perforated diverticulitis in April 2021, which was managed nonoperatively with IV antibiotics.  Her last colonoscopy was in June 2020, which showed sigmoid diverticulosis and a benign tubular adenoma.  ROS: Review of Systems  Constitutional:  Positive for malaise/fatigue. Negative for chills and fever.  HENT:  Negative for sore throat.   Respiratory:  Negative for shortness of breath, wheezing and stridor.   Cardiovascular:  Negative for chest pain.  Gastrointestinal:  Positive for abdominal pain, nausea and vomiting. Negative for blood in stool and diarrhea.  Genitourinary:  Negative for dysuria.  Neurological:  Negative for seizures and loss of consciousness.   Family History  Problem Relation Age of Onset   Ovarian cancer Mother    Colon cancer Sister 64   Breast cancer Sister    Esophageal cancer Neg Hx    Colon polyps Neg Hx    Rectal cancer Neg Hx    Stomach cancer Neg Hx     Past Medical History:  Diagnosis Date   Allergic rhinitis, seasonal    takes Claritin daily as needed and uses Flonase if needed   Allergy    Arthritis    L knee s/p TKR 6/14, R knee s/p TKR 6/16   Diverticulitis    GERD (gastroesophageal reflux disease)    doesn't take any meds   Heart murmur    no symptoms   History of bronchitis winter of 2014   History of colon polyps    benign   History of kidney stones     Hyperlipidemia    Hypertension    takes Hyzaar daily   Vitamin D deficiency disease 08/14/2011   takes Vit D daily    Past Surgical History:  Procedure Laterality Date   BREAST BIOPSY Left    Breast ultrasound Right 03/02/13   The 6mm complex cyst in the right breast is probably benign. Follow-up in 6 months with bilateral mammogram and (R) ultrasound   Breast Ultrasound Right 09/02/13   There is a benign 3 mm complex cyst in the (R) breast at 9 o'clock   [place]   tumor removed   (benign)   KNEE ARTHROSCOPY     bilateral   LITHOTRIPSY  08/2011   R ureteral stone   POLYPECTOMY     TOTAL KNEE ARTHROPLASTY Left 11/16/2012   Procedure: TOTAL KNEE ARTHROPLASTY- left;  Surgeon: Johnny Bridge, MD;  Location: [place];  Service: Orthopedics;  Laterality: Left;   TOTAL KNEE ARTHROPLASTY Right 12/06/2014   Procedure: TOTAL KNEE ARTHROPLASTY;  Surgeon: Marchia Bond, MD;  Location: [place];  Service: Orthopedics;  Laterality: Right;    Social History:  reports that she has quit smoking. She has a 2.50 pack-year smoking history. She has never used smokeless tobacco. She reports that she does not drink alcohol and does not use drugs.  Allergies:  Allergies  Allergen Reactions   Amlodipine Cough   Metformin And Related     diarrhea   Pravastatin     Joint ache    (Not in a hospital admission)    Physical Exam: Blood pressure (!) 142/75, pulse 76, temperature 98.5 F (36.9 C), temperature source Oral, resp. rate 16, height 5\' 4"  (1.626 m), weight 127 kg, SpO2 98 %. General: resting comfortably, appears stated age, no apparent distress Neurological: alert and oriented, no focal deficits, cranial nerves grossly in tact HEENT: normocephalic, atraumatic, oropharynx clear, no scleral icterus CV: regular rate and rhythm, extremities warm and well-perfused Respiratory: normal work of breathing on room air, symmetric chest wall expansion Abdomen: soft, nondistended,  focally tender to palpation in the LLQ and suprapubic area. No masses or organomegaly. Extremities: warm and well-perfused, no deformities, moving all extremities spontaneously Psychiatric: normal mood and affect Skin: warm and dry, no jaundice, no rashes or lesions   Results for orders placed or performed during the hospital encounter of 06/11/21 (from the past 48 hour(s))  Lactic acid, plasma     Status: None   Collection Time: 06/11/21  3:50 AM  Result Value Ref Range   Lactic Acid, Venous 1.1 0.5 - 1.9 mmol/L    Comment: Performed at [facility], [address] [place], [place] [address]  CBC with Differential/Platelet     Status: Abnormal   Collection Time: 06/11/21  3:50 AM  Result Value Ref Range   WBC 12.3 (H) 4.0 - 10.5 K/uL   RBC 4.66 3.87 - 5.11 MIL/uL   Hemoglobin 13.2 12.0 - 15.0 g/dL   HCT 39.5 36.0 - 46.0 %   MCV 84.8 80.0 - 100.0 fL   MCH 28.3 26.0 - 34.0 pg   MCHC 33.4 30.0 - 36.0 g/dL   RDW 13.2 11.5 - 15.5 %   Platelets 300 150 - 400 K/uL   nRBC 0.0 0.0 - 0.2 %   Neutrophils Relative % 87 %   Neutro Abs 10.8 (H) 1.7 - 7.7 K/uL   Lymphocytes Relative 6 %   Lymphs Abs 0.7 0.7 - 4.0 K/uL   Monocytes Relative 6 %   Monocytes Absolute 0.7 0.1 - 1.0 K/uL   Eosinophils Relative 1 %   Eosinophils Absolute 0.1 0.0 - 0.5 K/uL   Basophils Relative 0 %   Basophils Absolute 0.0 0.0 - 0.1 K/uL   Immature Granulocytes 0 %   Abs Immature Granulocytes 0.05 0.00 - 0.07 K/uL    Comment: Performed at [facility], [address] [place], [place] [address]  Comprehensive metabolic panel     Status: Abnormal   Collection Time: 06/11/21  3:50 AM  Result Value Ref Range   Sodium 132 (L) 135 - 145 mmol/L   Potassium 3.3 (L) 3.5 - 5.1 mmol/L   Chloride 95 (L) 98 - 111 mmol/L   CO2 26 22 - 32 mmol/L   Glucose, Bld 160 (H) 70 - 99 mg/dL    Comment: Glucose reference range applies only to samples taken after fasting for at least 8 hours.   BUN 13 8 - 23  mg/dL   Creatinine, Ser 0.75 0.44 - 1.00 mg/dL   Calcium 9.5 8.9 - 10.3 mg/dL   Total Protein 8.4 (H) 6.5 - 8.1 g/dL   Albumin 3.6 3.5 - 5.0 g/dL   AST 19 15 - 41 U/L   ALT 19 0 - 44 U/L   Alkaline Phosphatase 65 38 - 126 U/L  Total Bilirubin 0.6 0.3 - 1.2 mg/dL   GFR, Estimated >60 >60 mL/min    Comment: (NOTE) Calculated using the CKD-EPI Creatinine Equation (2021)    Anion gap 11 5 - 15    Comment: Performed at [facility], [address] [place], [place] [address]  Lipase, blood     Status: None   Collection Time: 06/11/21  3:50 AM  Result Value Ref Range   Lipase 31 11 - 51 U/L    Comment: Performed at [facility], [address] [place], [place] [address]  Urinalysis, Routine w reflex microscopic Urine, Clean Catch     Status: None   Collection Time: 06/11/21  6:00 AM  Result Value Ref Range   Color, Urine YELLOW YELLOW   APPearance CLEAR CLEAR   Specific Gravity, Urine 1.025 1.005 - 1.030   pH 7.0 5.0 - 8.0   Glucose, UA NEGATIVE NEGATIVE mg/dL   Hgb urine dipstick NEGATIVE NEGATIVE   Bilirubin Urine NEGATIVE NEGATIVE   Ketones, ur NEGATIVE NEGATIVE mg/dL   Protein, ur NEGATIVE NEGATIVE mg/dL   Nitrite NEGATIVE NEGATIVE   Leukocytes,Ua NEGATIVE NEGATIVE    Comment: Microscopic not done on urines with negative protein, blood, leukocytes, nitrite, or glucose < 500 mg/dL. Performed at [facility], [address] [place], [place] [address]   Resp Panel by RT-PCR (Flu A&B, Covid)     Status: None   Collection Time: 06/11/21  6:32 AM   Specimen: Nasopharyngeal(NP) swabs in vial transport medium  Result Value Ref Range   SARS Coronavirus 2 by RT PCR NEGATIVE NEGATIVE    Comment: (NOTE) SARS-CoV-2 target nucleic acids are NOT DETECTED.  The SARS-CoV-2 RNA is generally detectable in upper respiratory specimens during the acute phase of infection. The lowest concentration of SARS-CoV-2 viral copies this assay can detect is 138  copies/mL. A negative result does not preclude SARS-Cov-2 infection and should not be used as the sole basis for treatment or other patient management decisions. A negative result may occur with  improper specimen collection/handling, submission of specimen other than nasopharyngeal swab, presence of viral mutation(s) within the areas targeted by this assay, and inadequate number of viral copies(<138 copies/mL). A negative result must be combined with clinical observations, patient history, and epidemiological information. The expected result is Negative.  Fact Sheet for Patients:  EntrepreneurPulse.com.au  Fact Sheet for Healthcare Providers:  IncredibleEmployment.be  This test is no t yet approved or cleared by the [place] FDA and  has been authorized for detection and/or diagnosis of SARS-CoV-2 by FDA under an Emergency Use Authorization (EUA). This EUA will remain  in effect (meaning this test can be used) for the duration of the COVID-19 declaration under Section 564(b)(1) of the Act, 21 U.S.C.section 360bbb-3(b)(1), unless the authorization is terminated  or revoked sooner.       Influenza A by PCR NEGATIVE NEGATIVE   Influenza B by PCR NEGATIVE NEGATIVE    Comment: (NOTE) The Xpert Xpress SARS-CoV-2/FLU/RSV plus assay is intended as an aid in the diagnosis of influenza from Nasopharyngeal swab specimens and should not be used as a sole basis for treatment. Nasal washings and aspirates are unacceptable for Xpert Xpress SARS-CoV-2/FLU/RSV testing.  Fact Sheet for Patients: EntrepreneurPulse.com.au  Fact Sheet for Healthcare Providers: IncredibleEmployment.be  This test is not yet approved or cleared by the [place] FDA and has been authorized for detection and/or diagnosis of SARS-CoV-2 by FDA under an Emergency Use  Authorization (EUA). This EUA will remain in effect (meaning this test  can be used) for the duration of the COVID-19 declaration under Section 564(b)(1) of the Act, 21 U.S.C. section 360bbb-3(b)(1), unless the authorization is terminated or revoked.  Performed at [facility], [address] [place], [place] [address]    CT ABDOMEN PELVIS W CONTRAST  Result Date: 06/11/2021 CLINICAL DATA:  Diverticulitis suspected, previous history of diverticulitis and with lower abdominal pain since 3 days ago. EXAM: CT ABDOMEN AND PELVIS WITH CONTRAST TECHNIQUE: Multidetector CT imaging of the abdomen and pelvis was performed using the standard protocol following bolus administration of intravenous contrast. CONTRAST:  132mL OMNIPAQUE IOHEXOL 300 MG/ML  SOLN COMPARISON:  Similar study from 09/24/2019. FINDINGS: Lower chest: The heart is slightly enlarged. No pericardial effusion is evident. Lung bases show scattered linear scarring or atelectasis without infiltrates. Hepatobiliary: 20 cm in length mildly steatotic liver without mass. The gallbladder and bile ducts are unremarkable. Pancreas: There is mild partial fatty atrophy of the pancreas without mass enhancement, ductal dilatation or inflammatory changes. Spleen: Unremarkable. Adrenals/Urinary Tract: There is no adrenal mass no focal abnormality in the renal cortex. There is no calculus or hydronephrosis. Slight perinephric stranding appears similar. The bladder is contracted and not well seen. Stomach/Bowel: Small hiatal hernia. The stomach is contracted. The unopacified small bowel is unremarkable. The appendix is normal caliber. There are colonic diverticula, most numerous in the sigmoid, where there is again noted a posterior wall defect involving the mid third of the sigmoid, with wall thickening in the remainder and a surrounding inflammatory reaction. There are scattered extraluminal air droplets in the area and a complex fluid collection posterior to the diseased segment measuring 5.3 x 3.6 by 5 4 cm consistent  with a forming abscess. Portions of the wall of this do show rim enhancement. There are a few tiny droplets of free air just above the abscess but no free air is seen in the abdomen cephalad to this. Vascular/Lymphatic: Aortic atherosclerosis. No enlarged abdominal or pelvic lymph nodes. Pelvic phleboliths. Reproductive: The uterus and ovaries are not enlarged. Other: Small umbilical and inguinal fat hernias. There is minimal low-density fluid in the posterior pelvis which is probably reactive. Musculoskeletal: There are degenerative changes of the thoracic lumbar spine. No worrisome regional skeletal lesion. Advanced facet hypertrophy L4-5 and L5-S1. IMPRESSION: 1. Wall thickening and inflammation involving the mid sigmoid colon, in the setting of diverticulosis and with an ill-defined posterior wall defect and adjacent 5.6 x 3.6 x 5.4 cm fluid collection developing consistent with an abscess. This does not appear fully formed and only portions of its outer wall are defined, with rim enhancement. 2. There are a few tiny extraluminal air pockets in the area as well. Underlying carcinoma is difficult to exclude but similar findings were noted previously although with no defined fluid collection present on the previous CT. 3. Colonoscopy follow-up recommended after treatment. Consultation with a surgeon is recommended. 4. Constipation. 5. Fatty liver. 6. Umbilical and inguinal fat hernias. Electronically Signed   By: Telford Nab M.D.   On: 06/11/2021 06:06      Assessment/Plan 71 yo female with perforated sigmoid diverticulitis.  I personally reviewed her CT scan, which shows thickening and some extraluminal foci of gas around the sigmoid colon, and a developing abscess.  This is consistent with recurrent diverticulitis with contained perforation. - Bowel rest, IV antibiotics (Rocephin/Flagyl) - IV fluid hydration - Pain and nausea control - Abscess appears ill-defined  on CT, if patient does not improve  with abx will discuss drain placement with IR - VTE: lovenox, SCDs - Dispo: admit to inpatient   Michaelle Birks, MD [facility], Hepatobiliary and Pancreatic Surgery 06/11/21 10:51 AM

## 2021-06-11 NOTE — ED Notes (Signed)
PHONE HANDOFF REPORT PROVIDED TO [place]

## 2021-06-11 NOTE — ED Notes (Signed)
Awaiting tx to [facility] for further evaluation/ care.

## 2021-06-11 NOTE — Progress Notes (Signed)
Frances Gray  Code Status: FULL Frances Gray is a 71 y.o. female patient admitted from ED awake, alert - oriented X4 - no acute distress noted. VSS -  no c/o shortness of breath. Call light within reach, patient able to voice, and demonstrate understanding. Skin, clean-dry- intact without evidence of bruising, or skin tears.  No evidence of skin break down noted on exam.  ?  Will cont to eval and treat per MD orders.  Melonie Florida, RN  06/11/2021 7:33 PM

## 2021-06-11 NOTE — ED Provider Notes (Signed)
[facility] Provider Note   CSN: [id number] Arrival date & time: 06/11/21  0304     History  Chief Complaint  Patient presents with   Constipation   Abdominal Pain   Nausea   Emesis    Frances Gray is a 71 y.o. female.  Patient states intermittent nausea and vomiting since Friday for the past 3 days.  Has been able to tolerate water but no food.  Has had crampy lower abdominal pain first on the right side but now to the left side.  Pain is sharp and stabbing and she is not able to get comfortable.  Pain feels similar to when she last had diverticulitis.  Has had multiple episodes of vomiting today.  Does not think she is had a fever.  But is febrile on ED arrival.  No chest pain or shortness of breath.  No back pain or flank pain.  No pain with urination or blood in the urine.  No chest pain or shortness of breath.  No previous abdominal surgeries.  States has not had a bowel movement for the past 2 days other than a small hard amount.  Denies any diarrhea.  The history is provided by the patient.  Constipation Associated symptoms: abdominal pain, fever, nausea and vomiting   Associated symptoms: no back pain, no diarrhea and no dysuria   Abdominal Pain Associated symptoms: constipation, fever, nausea and vomiting   Associated symptoms: no chest pain, no cough, no diarrhea, no dysuria, no hematuria and no shortness of breath   Emesis Associated symptoms: abdominal pain and fever   Associated symptoms: no arthralgias, no cough, no diarrhea, no headaches and no myalgias       Home Medications Prior to Admission medications   Medication Sig Start Date End Date Taking? Authorizing Provider  Biotin 10 MG TABS Take by mouth.    [provider]  calcium carbonate (CALCIUM 600) 600 MG TABS tablet Take 1 tablet (600 mg total) by mouth 2 (two) times daily with a meal. 05/12/14   Rowe Clack, MD  cetirizine (ZYRTEC) 10 MG tablet Take 10 mg  by mouth daily as needed for allergies.    [provider]  Cholecalciferol (VITAMIN D3) 2000 UNITS TABS Take 2,000 Units by mouth every morning.     [provider]  Cyanocobalamin (VITAMIN B 12 PO) Take by mouth.    [provider]  finasteride (PROSCAR) 5 MG tablet  10/18/19   [provider]  Fluocinolone Acetonide Scalp 0.01 % OIL Apply 1 mL topically at bedtime as needed (itchy scalp).  08/31/18   [provider]  fluticasone (FLONASE) 50 MCG/ACT nasal spray Place 1 spray into both nostrils daily as needed for allergies or rhinitis.  10/05/14   [provider]  hydroquinone 4 % cream Apply topically at bedtime. 12/15/20   [provider]  losartan-hydrochlorothiazide (HYZAAR) 100-25 MG tablet TAKE 1 TABLET BY MOUTH EVERY DAY 06/05/21   Binnie Rail, MD  Multiple Vitamin (MULTIVITAMIN) tablet Take 1 tablet by mouth every morning.     [provider]  Multiple Vitamins-Minerals (ZINC PO) Take by mouth.    [provider]  rosuvastatin (CRESTOR) 5 MG tablet TAKE 1 TABLET BY MOUTH TWICE WEEKLY 04/20/21   Burns, Claudina Lick, MD  sennosides-docusate sodium (SENOKOT-S) 8.6-50 MG tablet Take 1 tablet by mouth at bedtime. 11/19/17   Binnie Rail, MD      Allergies    Amlodipine, Metformin  and related, and Pravastatin    Review of Systems   Review of Systems  Constitutional:  Positive for activity change, appetite change and fever.  HENT:  Negative for congestion.   Respiratory:  Negative for cough, chest tightness and shortness of breath.   Cardiovascular:  Negative for chest pain.  Gastrointestinal:  Positive for abdominal pain, constipation, nausea and vomiting. Negative for diarrhea.  Genitourinary:  Negative for dysuria and hematuria.  Musculoskeletal:  Negative for arthralgias, back pain and myalgias.  Skin:  Negative for rash.  Neurological:  Negative for weakness and headaches.   all other systems are negative  except as noted in the HPI and PMH.   Physical Exam Updated Vital Signs BP (!) 155/86 (BP Location: Right Arm)    Pulse 84    Temp (!) 100.4 F (38 C) (Oral)    Resp (!) 26    Ht 5\' 4"  (1.626 m)    Wt 127 kg    SpO2 96%    BMI 48.06 kg/m  Physical Exam Vitals and nursing note reviewed.  Constitutional:      General: She is not in acute distress.    Appearance: She is well-developed. She is obese.     Comments: Uncomfortable appearing  HENT:     Head: Normocephalic and atraumatic.     Mouth/Throat:     Pharynx: No oropharyngeal exudate.  Eyes:     Conjunctiva/sclera: Conjunctivae normal.     Pupils: Pupils are equal, round, and reactive to light.  Neck:     Comments: No meningismus. Cardiovascular:     Rate and Rhythm: Normal rate and regular rhythm.     Heart sounds: Normal heart sounds. No murmur heard. Pulmonary:     Effort: Pulmonary effort is normal. No respiratory distress.     Breath sounds: Normal breath sounds.  Abdominal:     Palpations: Abdomen is soft.     Tenderness: There is abdominal tenderness. There is guarding. There is no rebound.     Comments: Periumbilical and left lower quadrant tenderness with voluntary guarding.  No rebound Soft abdomen.  Musculoskeletal:        General: No tenderness. Normal range of motion.     Cervical back: Normal range of motion and neck supple.  Skin:    General: Skin is warm.     Capillary Refill: Capillary refill takes less than 2 seconds.     Findings: No rash.  Neurological:     General: No focal deficit present.     Mental Status: She is alert and oriented to person, place, and time. Mental status is at baseline.     Cranial Nerves: No cranial nerve deficit.     Motor: No abnormal muscle tone.     Coordination: Coordination normal.     Comments:  5/5 strength throughout. CN 2-12 intact.Equal grip strength.   Psychiatric:        Behavior: Behavior normal.    ED Results / Procedures / Treatments   Labs (all labs  ordered are listed, but only abnormal results are displayed) Labs Reviewed  CBC WITH DIFFERENTIAL/PLATELET - Abnormal; Notable for the following components:      Result Value   WBC 12.3 (*)    Neutro Abs 10.8 (*)    All other components within normal limits  COMPREHENSIVE METABOLIC PANEL - Abnormal; Notable for the following components:   Sodium 132 (*)    Potassium 3.3 (*)    Chloride 95 (*)    Glucose,  Bld 160 (*)    Total Protein 8.4 (*)    All other components within normal limits  RESP PANEL BY RT-PCR (FLU A&B, COVID) ARPGX2  CULTURE, BLOOD (ROUTINE X 2)  CULTURE, BLOOD (ROUTINE X 2)  LACTIC ACID, PLASMA  LIPASE, BLOOD  URINALYSIS, ROUTINE W REFLEX MICROSCOPIC    EKG None  Radiology CT ABDOMEN PELVIS W CONTRAST  Result Date: 06/11/2021 CLINICAL DATA:  Diverticulitis suspected, previous history of diverticulitis and with lower abdominal pain since 3 days ago. EXAM: CT ABDOMEN AND PELVIS WITH CONTRAST TECHNIQUE: Multidetector CT imaging of the abdomen and pelvis was performed using the standard protocol following bolus administration of intravenous contrast. CONTRAST:  135mL OMNIPAQUE IOHEXOL 300 MG/ML  SOLN COMPARISON:  Similar study from 09/24/2019. FINDINGS: Lower chest: The heart is slightly enlarged. No pericardial effusion is evident. Lung bases show scattered linear scarring or atelectasis without infiltrates. Hepatobiliary: 20 cm in length mildly steatotic liver without mass. The gallbladder and bile ducts are unremarkable. Pancreas: There is mild partial fatty atrophy of the pancreas without mass enhancement, ductal dilatation or inflammatory changes. Spleen: Unremarkable. Adrenals/Urinary Tract: There is no adrenal mass no focal abnormality in the renal cortex. There is no calculus or hydronephrosis. Slight perinephric stranding appears similar. The bladder is contracted and not well seen. Stomach/Bowel: Small hiatal hernia. The stomach is contracted. The unopacified small  bowel is unremarkable. The appendix is normal caliber. There are colonic diverticula, most numerous in the sigmoid, where there is again noted a posterior wall defect involving the mid third of the sigmoid, with wall thickening in the remainder and a surrounding inflammatory reaction. There are scattered extraluminal air droplets in the area and a complex fluid collection posterior to the diseased segment measuring 5.3 x 3.6 by 5 4 cm consistent with a forming abscess. Portions of the wall of this do show rim enhancement. There are a few tiny droplets of free air just above the abscess but no free air is seen in the abdomen cephalad to this. Vascular/Lymphatic: Aortic atherosclerosis. No enlarged abdominal or pelvic lymph nodes. Pelvic phleboliths. Reproductive: The uterus and ovaries are not enlarged. Other: Small umbilical and inguinal fat hernias. There is minimal low-density fluid in the posterior pelvis which is probably reactive. Musculoskeletal: There are degenerative changes of the thoracic lumbar spine. No worrisome regional skeletal lesion. Advanced facet hypertrophy L4-5 and L5-S1. IMPRESSION: 1. Wall thickening and inflammation involving the mid sigmoid colon, in the setting of diverticulosis and with an ill-defined posterior wall defect and adjacent 5.6 x 3.6 x 5.4 cm fluid collection developing consistent with an abscess. This does not appear fully formed and only portions of its outer wall are defined, with rim enhancement. 2. There are a few tiny extraluminal air pockets in the area as well. Underlying carcinoma is difficult to exclude but similar findings were noted previously although with no defined fluid collection present on the previous CT. 3. Colonoscopy follow-up recommended after treatment. Consultation with a surgeon is recommended. 4. Constipation. 5. Fatty liver. 6. Umbilical and inguinal fat hernias. Electronically Signed   By: Telford Nab M.D.   On: 06/11/2021 06:06     Procedures .Critical Care Performed by: Ezequiel Essex, MD Authorized by: Ezequiel Essex, MD   Critical care provider statement:    Critical care time (minutes):  35   Critical care time was exclusive of:  Separately billable procedures and treating other patients   Critical care was necessary to treat or prevent imminent or life-threatening deterioration of  the following conditions: sepsis, intraabdominal abscess, perforated viscus.   Critical care was time spent personally by me on the following activities:  Development of treatment plan with patient or surrogate, discussions with consultants, evaluation of patient's response to treatment, examination of patient, ordering and review of laboratory studies, ordering and review of radiographic studies, ordering and performing treatments and interventions, pulse oximetry, re-evaluation of patient's condition and review of old charts    Medications Ordered in ED Medications  sodium chloride 0.9 % bolus 1,000 mL (has no administration in time range)  fentaNYL (SUBLIMAZE) injection 50 mcg (has no administration in time range)  ondansetron (ZOFRAN) injection 4 mg (has no administration in time range)    ED Course/ Medical Decision Making/ A&P                           Medical Decision Making  Lower abdominal tenderness with nausea and vomiting and fever.  Concern for possible diverticulitis.  IVF, symptom control given, will need imaging to evaluate for diverticulitis complication or other acute surgical process.  WBC 12. Lactate normal.  CT scan concerning for sigmoid perforation with large adjacent abscess. Images reviewed by me. Few areas of extraluminal air. D/w radiology Dr. Vanita Panda.  Broad spectrum antibiotics begun. D/w surgery Dr. Marlou Starks who requests transfer to [facility] ED for evaluation.   Patient and husband updated.  D/w Dr. Florina Ou who accepts to [facility] ED. Vitals, mental status, and airway remain stable at time of  transfer.         Final Clinical Impression(s) / ED Diagnoses Final diagnoses:  Perforated sigmoid colon ([place])  Intra-abdominal abscess [facility])    Rx / [place] Orders ED Discharge Orders     None         Parish Dubose, Annie Main, MD 06/11/21 [id number]

## 2021-06-11 NOTE — ED Triage Notes (Signed)
Patient states she has had nausea, vomiting and lower abdominal pain since Friday.  She states she vomited x3 episodes today.  PMH of diverticulitis and states this feels similar.  Patient moaning and rocking on bed in triage.

## 2021-06-11 NOTE — ED Notes (Signed)
ED MD verbal order rec to medicate client for transport.

## 2021-06-11 NOTE — ED Notes (Signed)
PHONE HANDOFF REPORT GIVEN TO CHARGE RN AT [facility] ED

## 2021-06-12 ENCOUNTER — Inpatient Hospital Stay: Admission: RE | Admit: 2021-06-12 | Payer: Medicare Other | Source: Ambulatory Visit

## 2021-06-12 LAB — BASIC METABOLIC PANEL
Anion gap: 5 (ref 5–15)
BUN: 9 mg/dL (ref 8–23)
CO2: 28 mmol/L (ref 22–32)
Calcium: 8.4 mg/dL — ABNORMAL LOW (ref 8.9–10.3)
Chloride: 101 mmol/L (ref 98–111)
Creatinine, Ser: 0.85 mg/dL (ref 0.44–1.00)
GFR, Estimated: >60 mL/min (ref >60)
Glucose, Bld: 114 mg/dL — ABNORMAL HIGH (ref 70–99)
Potassium: 3.3 mmol/L — ABNORMAL LOW (ref 3.5–5.1)
Sodium: 134 mmol/L — ABNORMAL LOW (ref 135–145)

## 2021-06-12 LAB — CBC
HCT: 34.9 % — ABNORMAL LOW (ref 36.0–46.0)
Hemoglobin: 11.2 g/dL — ABNORMAL LOW (ref 12.0–15.0)
MCH: 27.9 pg (ref 26.0–34.0)
MCHC: 32.1 g/dL (ref 30.0–36.0)
MCV: 87 fL (ref 80.0–100.0)
Platelets: 275 10*3/uL (ref 150–400)
RBC: 4.01 MIL/uL (ref 3.87–5.11)
RDW: 13.4 % (ref 11.5–15.5)
WBC: 9.5 10*3/uL (ref 4.0–10.5)
nRBC: 0 % (ref 0.0–0.2)

## 2021-06-12 LAB — PROTIME-INR
INR: 1.2 (ref 0.8–1.2)
Prothrombin Time: 14.9 seconds (ref 11.4–15.2)

## 2021-06-12 LAB — MAGNESIUM: Magnesium: 1.9 mg/dL (ref 1.7–2.4)

## 2021-06-12 MED ORDER — POTASSIUM CHLORIDE CRYS ER 20 MEQ PO TBCR
40.0000 meq | EXTENDED_RELEASE_TABLET | Freq: Once | ORAL | Status: AC
  Administered 2021-06-12: 40 meq via ORAL
  Filled 2021-06-12: qty 2

## 2021-06-12 NOTE — Progress Notes (Signed)
Transition of Care [facility]) Screening Note  Patient Details  Name: Frances Gray Date of Birth: Nov 05, 1950  Transition of Care [facility]) CM/SW Contact:    Sherie Don, LCSW Phone Number: 06/12/2021, 10:52 AM  Transition of Care Department [facility]) has reviewed patient and no TOC needs have been identified at this time. We will continue to monitor patient advancement through interdisciplinary progression rounds. If new patient transition needs arise, please place a TOC consult.

## 2021-06-12 NOTE — Progress Notes (Signed)
Subjective: Feeling better today, pain improving. WBC normalized. Afebrile. No nausea or vomiting.   Objective: Vital signs in last 24 hours: Temp:  [98.5 F (36.9 C)-99.7 F (37.6 C)] 99.2 F (37.3 C) (01/03 0612) Pulse Rate:  [67-78] 67 (01/03 0612) Resp:  [16-18] 18 (01/03 0612) BP: (104-168)/(51-86) 104/51 (01/03 0612) SpO2:  [94 %-100 %] 94 % (01/03 0612) Last BM Date: 06/10/21  Intake/Output from previous day: 01/02 0701 - 01/03 0700 In: 1773.7 [I.V.:1473.7; IV Piggyback:300] Out: 1450 [Urine:1450] Intake/Output this shift: No intake/output data recorded.  PE: General: resting comfortably, NAD Neuro: alert and oriented, no focal deficits Resp: normal work of breathing on room air Abdomen: soft, nondistended, nontender to palpation. Extremities: warm and well-perfused   Lab Results:  Recent Labs    06/11/21 0350 06/12/21 0349  WBC 12.3* 9.5  HGB 13.2 11.2*  HCT 39.5 34.9*  PLT 300 275   BMET Recent Labs    06/11/21 0350 06/12/21 0349  NA 132* 134*  K 3.3* 3.3*  CL 95* 101  CO2 26 28  GLUCOSE 160* 114*  BUN 13 9  CREATININE 0.75 0.85  CALCIUM 9.5 8.4*   PT/INR Recent Labs    06/12/21 0349  LABPROT 14.9  INR 1.2   CMP     Component Value Date/Time   NA 134 (L) 06/12/2021 0349   K 3.3 (L) 06/12/2021 0349   CL 101 06/12/2021 0349   CO2 28 06/12/2021 0349   GLUCOSE 114 (H) 06/12/2021 0349   BUN 9 06/12/2021 0349   CREATININE 0.85 06/12/2021 0349   CALCIUM 8.4 (L) 06/12/2021 0349   CALCIUM 9.8 11/28/2011 1244   PROT 8.4 (H) 06/11/2021 0350   ALBUMIN 3.6 06/11/2021 0350   AST 19 06/11/2021 0350   ALT 19 06/11/2021 0350   ALKPHOS 65 06/11/2021 0350   BILITOT 0.6 06/11/2021 0350   GFRNONAA >60 06/12/2021 0349   GFRAA >60 09/30/2019 0112   Lipase     Component Value Date/Time   LIPASE 31 06/11/2021 0350       Studies/Results: CT ABDOMEN PELVIS W CONTRAST  Result Date: 06/11/2021 CLINICAL DATA:  Diverticulitis  suspected, previous history of diverticulitis and with lower abdominal pain since 3 days ago. EXAM: CT ABDOMEN AND PELVIS WITH CONTRAST TECHNIQUE: Multidetector CT imaging of the abdomen and pelvis was performed using the standard protocol following bolus administration of intravenous contrast. CONTRAST:  189mL OMNIPAQUE IOHEXOL 300 MG/ML  SOLN COMPARISON:  Similar study from 09/24/2019. FINDINGS: Lower chest: The heart is slightly enlarged. No pericardial effusion is evident. Lung bases show scattered linear scarring or atelectasis without infiltrates. Hepatobiliary: 20 cm in length mildly steatotic liver without mass. The gallbladder and bile ducts are unremarkable. Pancreas: There is mild partial fatty atrophy of the pancreas without mass enhancement, ductal dilatation or inflammatory changes. Spleen: Unremarkable. Adrenals/Urinary Tract: There is no adrenal mass no focal abnormality in the renal cortex. There is no calculus or hydronephrosis. Slight perinephric stranding appears similar. The bladder is contracted and not well seen. Stomach/Bowel: Small hiatal hernia. The stomach is contracted. The unopacified small bowel is unremarkable. The appendix is normal caliber. There are colonic diverticula, most numerous in the sigmoid, where there is again noted a posterior wall defect involving the mid third of the sigmoid, with wall thickening in the remainder and a surrounding inflammatory reaction. There are scattered extraluminal air droplets in the area and a complex fluid collection posterior to the diseased segment measuring 5.3 x  3.6 by 5 4 cm consistent with a forming abscess. Portions of the wall of this do show rim enhancement. There are a few tiny droplets of free air just above the abscess but no free air is seen in the abdomen cephalad to this. Vascular/Lymphatic: Aortic atherosclerosis. No enlarged abdominal or pelvic lymph nodes. Pelvic phleboliths. Reproductive: The uterus and ovaries are not  enlarged. Other: Small umbilical and inguinal fat hernias. There is minimal low-density fluid in the posterior pelvis which is probably reactive. Musculoskeletal: There are degenerative changes of the thoracic lumbar spine. No worrisome regional skeletal lesion. Advanced facet hypertrophy L4-5 and L5-S1. IMPRESSION: 1. Wall thickening and inflammation involving the mid sigmoid colon, in the setting of diverticulosis and with an ill-defined posterior wall defect and adjacent 5.6 x 3.6 x 5.4 cm fluid collection developing consistent with an abscess. This does not appear fully formed and only portions of its outer wall are defined, with rim enhancement. 2. There are a few tiny extraluminal air pockets in the area as well. Underlying carcinoma is difficult to exclude but similar findings were noted previously although with no defined fluid collection present on the previous CT. 3. Colonoscopy follow-up recommended after treatment. Consultation with a surgeon is recommended. 4. Constipation. 5. Fatty liver. 6. Umbilical and inguinal fat hernias. Electronically Signed   By: Telford Nab M.D.   On: 06/11/2021 06:06      Assessment/Plan 71 yo female with recurrent perforated sigmoid diverticulitis. Patient is clinically improving on IV antibiotics. - Continue ceftriaxone/flagyl - Advance to clear liquid diet, IVF at 75 ml/hr - Pain control - Mobilize today - VTE: lovenox, SCDs - Dispo: inpatient, med-surg floor    LOS: 1 day    Michaelle Birks, MD [facility] Surgery General, Hepatobiliary and Pancreatic Surgery 06/12/21 8:56 AM

## 2021-06-13 LAB — CBC
HCT: 36.2 % (ref 36.0–46.0)
Hemoglobin: 11.7 g/dL — ABNORMAL LOW (ref 12.0–15.0)
MCH: 28.5 pg (ref 26.0–34.0)
MCHC: 32.3 g/dL (ref 30.0–36.0)
MCV: 88.1 fL (ref 80.0–100.0)
Platelets: 327 10*3/uL (ref 150–400)
RBC: 4.11 MIL/uL (ref 3.87–5.11)
RDW: 13.3 % (ref 11.5–15.5)
WBC: 8.3 10*3/uL (ref 4.0–10.5)
nRBC: 0 % (ref 0.0–0.2)

## 2021-06-13 LAB — BASIC METABOLIC PANEL
Anion gap: 7 (ref 5–15)
BUN: 6 mg/dL — ABNORMAL LOW (ref 8–23)
CO2: 24 mmol/L (ref 22–32)
Calcium: 8.3 mg/dL — ABNORMAL LOW (ref 8.9–10.3)
Chloride: 102 mmol/L (ref 98–111)
Creatinine, Ser: 0.74 mg/dL (ref 0.44–1.00)
GFR, Estimated: >60 mL/min (ref >60)
Glucose, Bld: 130 mg/dL — ABNORMAL HIGH (ref 70–99)
Potassium: 3.8 mmol/L (ref 3.5–5.1)
Sodium: 133 mmol/L — ABNORMAL LOW (ref 135–145)

## 2021-06-13 NOTE — Progress Notes (Signed)
° ° °   °  Subjective: Abdominal pain continues to improve from yesterday. Tolerating clears without pain or bloating. No nausea or emesis. Passing flatus and feels like she needs to have a BM  No other complaints  Objective: Vital signs in last 24 hours: Temp:  [98.1 F (36.7 C)-99.1 F (37.3 C)] 98.5 F (36.9 C) (01/04 0514) Pulse Rate:  [63-76] 67 (01/04 0514) Resp:  [16-18] 16 (01/04 0514) BP: (108-169)/(72-95) 169/72 (01/04 0514) SpO2:  [96 %-100 %] 96 % (01/04 0514) Last BM Date: 06/10/21  Intake/Output from previous day: 01/03 0701 - 01/04 0700 In: 2670.1 [P.O.:1540; I.V.:930; IV Piggyback:200.1] Out: 1400 [Urine:1400] Intake/Output this shift: No intake/output data recorded.  PE: General: resting comfortably up in chair, NAD Neuro: alert and oriented, no focal deficits Resp: normal work of breathing on room air. CTAB Abdomen: soft, nondistended, nontender to palpation. Extremities: warm and well-perfused. No calf TTP bilaterally   Lab Results:  Recent Labs    06/12/21 0349 06/13/21 0756  WBC 9.5 8.3  HGB 11.2* 11.7*  HCT 34.9* 36.2  PLT 275 327    BMET Recent Labs    06/12/21 0349 06/13/21 0756  NA 134* 133*  K 3.3* 3.8  CL 101 102  CO2 28 24  GLUCOSE 114* 130*  BUN 9 6*  CREATININE 0.85 0.74  CALCIUM 8.4* 8.3*    PT/INR Recent Labs    06/12/21 0349  LABPROT 14.9  INR 1.2    CMP     Component Value Date/Time   NA 133 (L) 06/13/2021 0756   K 3.8 06/13/2021 0756   CL 102 06/13/2021 0756   CO2 24 06/13/2021 0756   GLUCOSE 130 (H) 06/13/2021 0756   BUN 6 (L) 06/13/2021 0756   CREATININE 0.74 06/13/2021 0756   CALCIUM 8.3 (L) 06/13/2021 0756   CALCIUM 9.8 11/28/2011 1244   PROT 8.4 (H) 06/11/2021 0350   ALBUMIN 3.6 06/11/2021 0350   AST 19 06/11/2021 0350   ALT 19 06/11/2021 0350   ALKPHOS 65 06/11/2021 0350   BILITOT 0.6 06/11/2021 0350   GFRNONAA >60 06/13/2021 0756   GFRAA >60 09/30/2019 0112   Lipase     Component Value  Date/Time   LIPASE 31 06/11/2021 0350     Studies/Results: No results found.    Assessment/Plan 71 yo female with recurrent perforated sigmoid diverticulitis. Patient is clinically improving on IV antibiotics. - CT scan 1/2 with sigmoid diverticulitis and 5.6 x 3.6 x 5.4 cm fluid collection developing consistent with an abscess. - Continue ceftriaxone/flagyl - Advance to soft diet - Pain control - encouraged ambulation in hallway today  FEN: soft ID: ceftriaxone/flagyl VTE: lovenox, SCDs   Dispo: inpatient, med-surg floor. Last colonoscopy 2020 and due in 2025, follow up after dc with GI for repeat colonoscopy    LOS: 2 days   Winferd Humphrey, [facility] Surgery 06/13/2021, 10:14 AM Please see Amion for pager number during day hours 7:00am-4:30pm

## 2021-06-14 MED ORDER — AMOXICILLIN-POT CLAVULANATE 875-125 MG PO TABS: 1.0000 | ORAL_TABLET | Freq: Two times a day (BID) | ORAL | 0 refills | Status: AC

## 2021-06-14 MED ORDER — POLYETHYLENE GLYCOL 3350 17 G PO PACK: 17.0000 g | PACK | Freq: Once | ORAL | Status: DC

## 2021-06-14 MED ORDER — TRAMADOL HCL 50 MG PO TABS
50.0000 mg | ORAL_TABLET | Freq: Four times a day (QID) | ORAL | 0 refills | Status: AC | PRN
Start: 2021-06-14 — End: 2021-06-17

## 2021-06-14 NOTE — Discharge Summary (Signed)
[facility] Discharge Summary   Patient ID: Frances Gray MRN: [id number] DOB/AGE: Oct 12, 1950 71 y.o.  Admit date: 06/11/2021 Discharge date: 06/14/2021  Admitting Diagnosis: Intra-abdominal abscess (HCC) [K65.1] Diverticulitis of colon with perforation [K57.20] Perforated sigmoid colon ([place]) [K63.1]  Discharge Diagnosis Intra-abdominal abscess ([place]) [K65.1] Diverticulitis of colon with perforation [K57.20] Perforated sigmoid colon ([place]) [K63.1]  Consultants None  Imaging: No results found.  Procedures none  Hospital Course:  71 year old female who presented to [facility] ED with abdominal pain.  Workup showed sigmoid diverticulitis with a surrounding phlegmon consistent with recurrent diverticulitis. Patient was admitted for bowel rest, IV fluids, and IV antibiotics. On CT abscess was ill defined and abdominal pain gradually improved therefore did not pursue abscess drainage at this time. Diet was introduced and slowly advanced which she tolerated well. Leukocytosis resolved. On date of discharge the patient was voiding well, tolerating soft/low fiber diet, ambulating well, pain well controlled, vital signs stable, and felt stable for discharge home.  Patient will follow up in our office with colorectal surgeon Dr. Johney [place] in 3-4 weeks and knows to call with questions or concerns. She will be discharged on Augmentin to complete a 14 day course of antibiotics total. She was instructed to continue low fiber diet for the next several weeks.  I or a member of my team have reviewed this patient in the Controlled Substance Database.  Allergies as of 06/14/2021       Reactions   Amlodipine Cough   Metformin And Related    diarrhea   Pravastatin    Joint ache        Medication List     TAKE these medications    amoxicillin-clavulanate 875-125 MG tablet Commonly known as: Augmentin Take 1 tablet by mouth 2 (two) times daily for 11 days.   Biotin 10 MG Tabs Take 10 mg by  mouth daily.   calcium carbonate 600 MG Tabs tablet Commonly known as: Calcium 600 Take 1 tablet (600 mg total) by mouth 2 (two) times daily with a meal.   cetirizine 10 MG tablet Commonly known as: ZYRTEC Take 10 mg by mouth daily as needed for allergies.   finasteride 5 MG tablet Commonly known as: PROSCAR 5 mg daily.   Fluocinolone Acetonide Scalp 0.01 % Oil Apply 1 mL topically at bedtime as needed (itchy scalp).   fluticasone 50 MCG/ACT nasal spray Commonly known as: FLONASE Place 1 spray into both nostrils daily as needed for allergies or rhinitis.   losartan-hydrochlorothiazide 100-25 MG tablet Commonly known as: HYZAAR TAKE 1 TABLET BY MOUTH EVERY DAY   multivitamin tablet Take 1 tablet by mouth every morning.   rosuvastatin 5 MG tablet Commonly known as: CRESTOR TAKE 1 TABLET BY MOUTH TWICE WEEKLY What changed: See the new instructions.   sennosides-docusate sodium 8.6-50 MG tablet Commonly known as: SENOKOT-S Take 1 tablet by mouth at bedtime.   traMADol 50 MG tablet Commonly known as: Ultram Take 1 tablet (50 mg total) by mouth every 6 (six) hours as needed for up to 3 days for severe pain.   VITAMIN B 12 PO Take 1 tablet by mouth daily.   Vitamin D3 50 MCG (2000 UT) Tabs Take 2,000 Units by mouth every morning.   ZINC PO Take 1 tablet by mouth daily.          Follow-up Information     Mauri Pole, MD. Call.   Specialty: Gastroenterology Why: call to set up colonoscopy for once current episode  of diverticulitis resolves Contact information: [place] [phone]         Michael Boston, MD. Go on 07/17/2021.   Specialties: General Surgery, Colon and Rectal Surgery Why: please follow up with one of our colorectal surgeons - Dr. Johney [place] - on 07/17/21 for appointment at 8:45 am. please arrive at 8:15 am and bring photo ID and insurance card if you have them to complete the check in process Contact  information: [place] [address] [phone]                 Signed: Caroll Rancher [facility] Surgery 06/14/2021, 2:21 PM Please see Amion for pager number during day hours 7:00am-4:30pm

## 2021-06-14 NOTE — Progress Notes (Addendum)
° ° °   °  Subjective: No abdominal pain or bloating this am. Tolerated soft diet well. No nausea or emesis. Still has not had BM. Passing flatus  No other complaints  Objective: Vital signs in last 24 hours: Temp:  [97.7 F (36.5 C)-99 F (37.2 C)] 98.3 F (36.8 C) (01/05 0550) Pulse Rate:  [64-68] 66 (01/05 0550) Resp:  [16-18] 18 (01/05 0550) BP: (133-155)/(64-95) 145/79 (01/05 0550) SpO2:  [96 %-97 %] 97 % (01/05 0550) Last BM Date: 06/10/21  Intake/Output from previous day: 01/04 0701 - 01/05 0700 In: 3612.8 [P.O.:2200; I.V.:1011.1; IV Piggyback:401.7] Out: 1500 [Urine:1500] Intake/Output this shift: No intake/output data recorded.  PE: General: resting comfortably up in chair, NAD Neuro: alert and oriented, no focal deficits Resp: normal work of breathing on room air. CTAB Abdomen: soft, nondistended, nontender to palpation. Extremities: warm and well-perfused. No calf TTP bilaterally   Lab Results:  Recent Labs    06/12/21 0349 06/13/21 0756  WBC 9.5 8.3  HGB 11.2* 11.7*  HCT 34.9* 36.2  PLT 275 327    BMET Recent Labs    06/12/21 0349 06/13/21 0756  NA 134* 133*  K 3.3* 3.8  CL 101 102  CO2 28 24  GLUCOSE 114* 130*  BUN 9 6*  CREATININE 0.85 0.74  CALCIUM 8.4* 8.3*    PT/INR Recent Labs    06/12/21 0349  LABPROT 14.9  INR 1.2    CMP     Component Value Date/Time   NA 133 (L) 06/13/2021 0756   K 3.8 06/13/2021 0756   CL 102 06/13/2021 0756   CO2 24 06/13/2021 0756   GLUCOSE 130 (H) 06/13/2021 0756   BUN 6 (L) 06/13/2021 0756   CREATININE 0.74 06/13/2021 0756   CALCIUM 8.3 (L) 06/13/2021 0756   CALCIUM 9.8 11/28/2011 1244   PROT 8.4 (H) 06/11/2021 0350   ALBUMIN 3.6 06/11/2021 0350   AST 19 06/11/2021 0350   ALT 19 06/11/2021 0350   ALKPHOS 65 06/11/2021 0350   BILITOT 0.6 06/11/2021 0350   GFRNONAA >60 06/13/2021 0756   GFRAA >60 09/30/2019 0112   Lipase     Component Value Date/Time   LIPASE 31 06/11/2021 0350      Studies/Results: No results found.    Assessment/Plan 71 yo female with recurrent perforated sigmoid diverticulitis. Patient is clinically improving on IV antibiotics. - CT scan 1/2 with sigmoid diverticulitis and 5.6 x 3.6 x 5.4 cm fluid collection developing consistent with an abscess. - tolerated soft diet - encouraged ambulation in hallway today  FEN: soft ID: ceftriaxone/flagyl VTE: lovenox, SCDs   Dispo: Last colonoscopy 2020 and due in 2025, follow up after dc with GI for repeat colonoscopy. discharge today on PO antibiotics    LOS: 3 days   Winferd Humphrey, [facility] Surgery 06/14/2021, 10:26 AM Please see Amion for pager number during day hours 7:00am-4:30pm

## 2021-06-14 NOTE — Plan of Care (Signed)

## 2021-06-14 NOTE — Progress Notes (Signed)
Assessment unchanged. Pt verbalized understanding of dc instructions through teach back. Discharged via wc to front entrance Accompanied by NT.

## 2021-06-14 NOTE — Discharge Instructions (Addendum)
Please maintain a low fiber diet for 4-6 weeks, then switch to a high fiber diet thereafter  WHEN TO CALL YOUR DOCTOR: Fever over 101.47F Not tolerating diet - increasing nausea or throwing up Increasing abdominal pain

## 2021-06-16 LAB — CULTURE, BLOOD (ROUTINE X 2)
Culture: NO GROWTH
Culture: NO GROWTH
Special Requests: ADEQUATE
Special Requests: ADEQUATE

## 2021-07-17 ENCOUNTER — Ambulatory Visit: Payer: Self-pay | Admitting: Surgery

## 2021-07-17 DIAGNOSIS — K5720 Diverticulitis of large intestine with perforation and abscess without bleeding: Secondary | ICD-10-CM | POA: Diagnosis not present

## 2021-07-17 DIAGNOSIS — E119 Type 2 diabetes mellitus without complications: Secondary | ICD-10-CM | POA: Diagnosis not present

## 2021-07-17 DIAGNOSIS — Z6841 Body Mass Index (BMI) 40.0 and over, adult: Secondary | ICD-10-CM | POA: Diagnosis not present

## 2021-07-17 DIAGNOSIS — K5732 Diverticulitis of large intestine without perforation or abscess without bleeding: Principal | ICD-10-CM | POA: Diagnosis present

## 2021-07-24 ENCOUNTER — Other Ambulatory Visit: Payer: Self-pay

## 2021-07-24 ENCOUNTER — Ambulatory Visit (HOSPITAL_COMMUNITY)
Admission: RE | Admit: 2021-07-24 | Discharge: 2021-07-24 | Disposition: A | Discharge status: Home / Self Care | Payer: Medicare Other | Source: Ambulatory Visit | Attending: Plastic Surgery | Admitting: Plastic Surgery

## 2021-07-24 DIAGNOSIS — I10 Essential (primary) hypertension: Secondary | ICD-10-CM | POA: Diagnosis not present

## 2021-07-24 DIAGNOSIS — Z7189 Other specified counseling: Secondary | ICD-10-CM | POA: Diagnosis not present

## 2021-07-24 DIAGNOSIS — K5792 Diverticulitis of intestine, part unspecified, without perforation or abscess without bleeding: Secondary | ICD-10-CM | POA: Insufficient documentation

## 2021-07-24 NOTE — Progress Notes (Signed)
[facility]   Reason for visit:  Diverticulitis with resection planned for 08/24/21  Possible ostomy. Presurgical marking and counseling today. Spouse is present at bedside.  They are both hoping that an ostomy is not needed.  I explain that the purpose of today is education and marking in the event an ostomy is needed.  They are grateful for the time to process and ask questions related to life with an ostomy.   HPI:  Recurrent diverticulitis with abscess.  Is currently not having a flare and the goal is to attempt resection when she is not acutely ill to avoid an ostomy ROS  Review of Systems  Gastrointestinal:  Negative for abdominal pain.       Discussed bowel prep instructions to begin 5 days before surgery (dietary changes) and the day before surgery.  Miralax, gatorade and dulcolax discussed.  She will stop her senokot 5 days before surgery.   Endocrine:       Pre diabetic  All other systems reviewed and are negative. Vital signs:  BP (!) 199/93    Pulse 60    Temp 98.1 F (36.7 C)    Resp 17    SpO2 98%  Exam:  Physical Exam Vitals reviewed.  Constitutional:      Appearance: She is obese.  Cardiovascular:     Rate and Rhythm: Normal rate and regular rhythm.     Comments: Hypertension on arrival, baseline hypertenstion, slightly improved by end of visit.   Skin:    General: Skin is warm and dry.  Neurological:     Mental Status: She is alert and oriented to person, place, and time.  Psychiatric:        Behavior: Behavior normal.        Thought Content: Thought content normal.        Judgment: Judgment normal.     Comments: Very anxious about idea of living with an ostomy.      Education provided:   We discuss life with an ostomy.  I show them examples of 1 piece vs 2 piece, closed vs open ended pouches.  We discuss twice weekly pouch changes, showering and life with an ostomy.  BP is checked at the conclusion of the visit. IT is now 142/90. The walk back to the  clinic is long and she was winded initially. She feels fine, she reports.     Impression/dx  Presurgical counseling/ostomy marking Discussion  LIfe with an ostomy Plan  See postoperatively if she gets an ostomy.  Info on clinic provided.     [place] Nurse requested for preoperative stoma site marking  Discussed surgical procedure and stoma creation with patient and family.  Explained role of the [place] nurse team.  Provided the patient with educational booklet and provided samples of pouching options.  Answered patient and family questions.   Examined patient lying, sitting, and standing in order to place the marking in the patient's visual field, away from any creases or abdominal contour issues and within the rectus muscle.  Patient has obese abdomen with deep creasing above umbilicus and soft rounded abdomen that prohibits view of stoma if place below umbilicus. .   Marked for colostomy in the LLQ 9  cm to the left of the umbilicus and 3 cm above the umbilicus.  Marked for ileostomy in the RLQ  9 cm to the right of the umbilicus and  3  cm above the umbilicus.  *Initial mark was higher and was  moved down once patient moved position and I could see more clearly how close this was to creasing and her waist band. Please use lower mark that is covered with transparent film.    Patient's abdomen cleansed with CHG wipes at site markings, allowed to air dry prior to marking.Covered mark with thin film transparent dressing to preserve mark until date of surgery.   [place] Nurse team will follow up with patient after surgery for continue ostomy care and teaching.   Visit time: 70 minutes.   Domenic Moras FNP-BC

## 2021-07-24 NOTE — Discharge Instructions (Signed)
Follow up post op in the ostomy clinic

## 2021-08-03 ENCOUNTER — Other Ambulatory Visit: Payer: Self-pay | Admitting: Urology

## 2021-08-03 NOTE — Progress Notes (Signed)
Sent message, via epic in basket, requesting orders in epic from surgeon.  

## 2021-08-06 ENCOUNTER — Ambulatory Visit: Payer: Self-pay | Admitting: Surgery

## 2021-08-07 ENCOUNTER — Encounter (HOSPITAL_COMMUNITY): Admission: RE | Admit: 2021-08-07 | Payer: Medicare Other | Source: Ambulatory Visit

## 2021-08-08 NOTE — Patient Instructions (Signed)
DUE TO COVID-19 ONLY ONE VISITOR IS ALLOWED TO COME WITH YOU AND STAY IN THE WAITING ROOM ONLY DURING PRE OP AND PROCEDURE.   **NO VISITORS ARE ALLOWED IN THE SHORT STAY AREA OR RECOVERY ROOM!!**  IF YOU WILL BE ADMITTED INTO THE HOSPITAL YOU ARE ALLOWED ONLY TWO SUPPORT PEOPLE DURING VISITATION HOURS ONLY (7 AM -8PM)   The support person(s) must pass our screening, gel in and out, and wear a mask at all times, including in the patients room. Patients must also wear a mask when staff or their support person are in the room. Visitors GUEST BADGE MUST BE WORN VISIBLY  One adult visitor may remain with you overnight and MUST be in the room by 8 P.M.  No visitors under the age of 52. Any visitor under the age of 44 must be accompanied by an adult.    COVID SWAB TESTING MUST BE COMPLETED ON: 08/22/21    Site: [facility] [place] [facility] [place] [place] Enter: Main Entrance have a seat in the waiting area to the right of main entrance (DO NOT [place]!!!!!) Dial: [phone] to alert staff you have arrived  You are not required to quarantine, however you are required to wear a well-fitted mask when you are out and around people not in your household.  Hand Hygiene often Do NOT share personal items Notify your provider if you are in close contact with someone who has COVID or you develop fever 100.4 or greater, new onset of sneezing, cough, sore throat, shortness of breath or body aches.  [place], [address], must go inside of the hospital, NOT A DRIVE THRU!  (Must self quarantine after testing. Follow instructions on handout.)       Your procedure is scheduled on: 08/24/21   Report to [facility] Main Entrance    Report to Short stay at : 5:15 AM   Call this number if you have problems the morning of surgery [place]: 4:30 AM THE  MORNING OF THE SURGERY. Water Black Coffee (sugar ok, NO MILK/CREAM OR CREAMERS)  Tea (sugar ok, NO MILK/CREAM OR CREAMERS) regular and decaf                             Plain Jell-O (NO RED)                                           Fruit ices (not with fruit pulp, NO RED)                                     Popsicles (NO RED)                                                                  Juice: apple, WHITE grape, WHITE cranberry Sports drinks like Gatorade (NO RED) Clear broth(vegetable,chicken,beef)  DRINK 2 PRESURGERY ENSURE DRINKS THE NIGHT BEFORE SURGERY AT  1000 PM AND 1 PRESURGERY DRINK THE DAY OF THE PROCEDURE 3 HOURS PRIOR TO SCHEDULED SURGERY. NO SOLIDS AFTER MIDNIGHT THE DAY PRIOR TO THE SURGERY. NOTHING BY MOUTH EXCEPT CLEAR LIQUIDS UNTIL THREE HOURS PRIOR TO SCHEDULED SURGERY. PLEASE FINISH PRESURGERY ENSURE DRINK PER SURGEON ORDER 3 HOURS PRIOR TO SCHEDULED SURGERY TIME WHICH NEEDS TO BE COMPLETED AT: 4:30 AM.      The day of surgery:  Drink ONE (1) Pre-Surgery Clear Ensure or G2 at AM the morning of surgery. Drink in one sitting. Do not sip.  This drink was given to you during your hospital  pre-op appointment visit. Nothing else to drink after completing the  Pre-Surgery Clear Ensure or G2.          If you have questions, please contact your surgeons office.  FOLLOW BOWEL PREP AND ANY ADDITIONAL PRE OP INSTRUCTIONS YOU RECEIVED FROM YOUR SURGEON'S OFFICE!!!   Oral Hygiene is also important to reduce your risk of infection.                                    Remember - BRUSH YOUR TEETH THE MORNING OF SURGERY WITH YOUR REGULAR TOOTHPASTE   Do NOT smoke after Midnight   Take these medicines the morning of surgery with A SIP OF WATER: FINASTERIDE.CETIRIZINE AS NEEDED.FLONASE AS USUAL.  DO NOT TAKE ANY ORAL DIABETIC MEDICATIONS DAY OF YOUR SURGERY                              You may not have any metal on your body including hair pins, jewelry, and body  piercing             Do not wear make-up, lotions, powders, perfumes/cologne, or deodorant  Do not wear nail polish including gel and S&S, artificial/acrylic nails, or any other type of covering on natural nails including finger and toenails. If you have artificial nails, gel coating, etc. that needs to be removed by a nail salon please have this removed prior to surgery or surgery may need to be canceled/ delayed if the surgeon/ anesthesia feels like they are unable to be safely monitored.   Do not shave  48 hours prior to surgery.    Do not bring valuables to the hospital. [place].   Contacts, dentures or bridgework may not be worn into surgery.   Bring small overnight bag day of surgery.    Patients discharged on the day of surgery will not be allowed to drive home.  Someone NEEDS to stay with you for the first 24 hours after anesthesia.   Special Instructions: Bring a copy of your healthcare power of attorney and living will documents         the day of surgery if you haven't scanned them before.              Please read over the following fact sheets you were given: IF YOU HAVE QUESTIONS ABOUT YOUR PRE-OP INSTRUCTIONS PLEASE CALL [phone]     [facility] - Preparing for Surgery Before surgery, you can play an important role.  Because skin is not sterile, your skin needs to be as free of germs as possible.  You can reduce the number of germs on your skin by washing with CHG (chlorahexidine gluconate) soap before surgery.  CHG is an antiseptic cleaner which kills germs and bonds with the skin to continue killing germs even after washing. Please DO NOT use if you have an allergy to CHG or antibacterial soaps.  If your skin becomes reddened/irritated stop using the CHG and inform your nurse when you arrive at Short Stay. Do not shave (including legs and underarms) for at least 48 hours prior to the first CHG shower.  You may shave your  face/neck. Please follow these instructions carefully:  1.  Shower with CHG Soap the night before surgery and the  morning of Surgery.  2.  If you choose to wash your hair, wash your hair first as usual with your  normal  shampoo.  3.  After you shampoo, rinse your hair and body thoroughly to remove the  shampoo.                           4.  Use CHG as you would any other liquid soap.  You can apply chg directly  to the skin and wash                       Gently with a scrungie or clean washcloth.  5.  Apply the CHG Soap to your body ONLY FROM THE NECK DOWN.   Do not use on face/ open                           Wound or open sores. Avoid contact with eyes, ears mouth and genitals (private parts).                       Wash face,  Genitals (private parts) with your normal soap.             6.  Wash thoroughly, paying special attention to the area where your surgery  will be performed.  7.  Thoroughly rinse your body with warm water from the neck down.  8.  DO NOT shower/wash with your normal soap after using and rinsing off  the CHG Soap.                9.  Pat yourself dry with a clean towel.            10.  Wear clean pajamas.            11.  Place clean sheets on your bed the night of your first shower and do not  sleep with pets. Day of Surgery : Do not apply any lotions/deodorants the morning of surgery.  Please wear clean clothes to the hospital/surgery center.  FAILURE TO FOLLOW THESE INSTRUCTIONS MAY RESULT IN THE CANCELLATION OF YOUR SURGERY PATIENT SIGNATURE_________________________________  NURSE SIGNATURE__________________________________  ________________________________________________________________________   Adam Phenix  An incentive spirometer is a tool that can help keep your lungs clear and active. This tool measures how well you are filling your lungs with each breath. Taking long deep breaths may help reverse or decrease the chance of developing breathing  (pulmonary) problems (especially infection) following: A long period of time when you are unable to move or be active. BEFORE THE PROCEDURE  If the spirometer includes an indicator to show your best effort, your nurse or respiratory therapist will set it  to a desired goal. If possible, sit up straight or lean slightly forward. Try not to slouch. Hold the incentive spirometer in an upright position. INSTRUCTIONS FOR USE  Sit on the edge of your bed if possible, or sit up as far as you can in bed or on a chair. Hold the incentive spirometer in an upright position. Breathe out normally. Place the mouthpiece in your mouth and seal your lips tightly around it. Breathe in slowly and as deeply as possible, raising the piston or the ball toward the top of the column. Hold your breath for 3-5 seconds or for as long as possible. Allow the piston or ball to fall to the bottom of the column. Remove the mouthpiece from your mouth and breathe out normally. Rest for a few seconds and repeat Steps 1 through 7 at least 10 times every 1-2 hours when you are awake. Take your time and take a few normal breaths between deep breaths. The spirometer may include an indicator to show your best effort. Use the indicator as a goal to work toward during each repetition. After each set of 10 deep breaths, practice coughing to be sure your lungs are clear. If you have an incision (the cut made at the time of surgery), support your incision when coughing by placing a pillow or rolled up towels firmly against it. Once you are able to get out of bed, walk around indoors and cough well. You may stop using the incentive spirometer when instructed by your caregiver.  RISKS AND COMPLICATIONS Take your time so you do not get dizzy or light-headed. If you are in pain, you may need to take or ask for pain medication before doing incentive spirometry. It is harder to take a deep breath if you are having pain. AFTER USE Rest and  breathe slowly and easily. It can be helpful to keep track of a log of your progress. Your caregiver can provide you with a simple table to help with this. If you are using the spirometer at home, follow these instructions: [place] IF:  You are having difficultly using the spirometer. You have trouble using the spirometer as often as instructed. Your pain medication is not giving enough relief while using the spirometer. You develop fever of 100.5 F (38.1 C) or higher. SEEK IMMEDIATE MEDICAL CARE IF:  You cough up bloody sputum that had not been present before. You develop fever of 102 F (38.9 C) or greater. You develop worsening pain at or near the incision site. MAKE SURE YOU:  Understand these instructions. Will watch your condition. Will get help right away if you are not doing well or get worse. Document Released: 10/07/2006 Document Revised: 08/19/2011 Document Reviewed: 12/08/2006 [facility] Patient Information 2014 [place], [place].   ________________________________________________________________________

## 2021-08-09 ENCOUNTER — Other Ambulatory Visit: Payer: Self-pay

## 2021-08-09 ENCOUNTER — Encounter (HOSPITAL_COMMUNITY): Payer: Self-pay

## 2021-08-09 ENCOUNTER — Encounter (HOSPITAL_COMMUNITY)
Admission: RE | Admit: 2021-08-09 | Discharge: 2021-08-09 | Disposition: A | Discharge status: Home / Self Care | Payer: Medicare Other | Source: Ambulatory Visit | Attending: Surgery | Admitting: Surgery

## 2021-08-09 VITALS — BP 154/93 | HR 58 | Temp 97.9°F | Resp 18 | Ht 64.0 in | Wt 271.0 lb

## 2021-08-09 DIAGNOSIS — I10 Essential (primary) hypertension: Secondary | ICD-10-CM | POA: Diagnosis not present

## 2021-08-09 DIAGNOSIS — Z01812 Encounter for preprocedural laboratory examination: Secondary | ICD-10-CM | POA: Diagnosis not present

## 2021-08-09 DIAGNOSIS — R7303 Prediabetes: Secondary | ICD-10-CM | POA: Insufficient documentation

## 2021-08-09 DIAGNOSIS — Z01818 Encounter for other preprocedural examination: Secondary | ICD-10-CM

## 2021-08-09 HISTORY — DX: Prediabetes: R73.03

## 2021-08-09 HISTORY — DX: Pneumonia, unspecified organism: J18.9

## 2021-08-09 LAB — HEMOGLOBIN A1C
Hgb A1c MFr Bld: 6.1 % — ABNORMAL HIGH (ref 4.8–5.6)
Mean Plasma Glucose: 128.37 mg/dL

## 2021-08-09 LAB — BASIC METABOLIC PANEL
Anion gap: 6 (ref 5–15)
BUN: 11 mg/dL (ref 8–23)
CO2: 29 mmol/L (ref 22–32)
Calcium: 9.4 mg/dL (ref 8.9–10.3)
Chloride: 101 mmol/L (ref 98–111)
Creatinine, Ser: 0.65 mg/dL (ref 0.44–1.00)
GFR, Estimated: >60 mL/min (ref >60)
Glucose, Bld: 114 mg/dL — ABNORMAL HIGH (ref 70–99)
Potassium: 4 mmol/L (ref 3.5–5.1)
Sodium: 136 mmol/L (ref 135–145)

## 2021-08-09 LAB — CBC
HCT: 41.5 % (ref 36.0–46.0)
Hemoglobin: 13.3 g/dL (ref 12.0–15.0)
MCH: 27.6 pg (ref 26.0–34.0)
MCHC: 32 g/dL (ref 30.0–36.0)
MCV: 86.1 fL (ref 80.0–100.0)
Platelets: 330 10*3/uL (ref 150–400)
RBC: 4.82 MIL/uL (ref 3.87–5.11)
RDW: 13.7 % (ref 11.5–15.5)
WBC: 4.8 10*3/uL (ref 4.0–10.5)
nRBC: 0 % (ref 0.0–0.2)

## 2021-08-09 LAB — GLUCOSE, CAPILLARY: Glucose-Capillary: 117 mg/dL — ABNORMAL HIGH (ref 70–99)

## 2021-08-09 NOTE — Consult Note (Addendum)
Center Nurse requested for preoperative stoma site marking ? ?This patient was marked on 07/24/21 by Domenic Moras, FNP in the [facility]. Original preop appt on 08/07/21, [name] notified [name] that the patient had been marked already. Appears that her preop appt may have been changed to 08/09/21 and we were notified again of the need for marking.  ? ?Original marking on 07/24/21 copied from [facility] note: ?Examined patient lying, sitting, and standing in order to place the marking in the patient's visual field, away from any creases or abdominal contour issues and within the rectus muscle.  Patient has obese abdomen with deep creasing above umbilicus and soft rounded abdomen that prohibits view of stoma if place below umbilicus. .  ?  ?Marked for colostomy in the LLQ 9  cm to the left of the umbilicus and 3 cm above the umbilicus. ?  ?Marked for ileostomy in the RLQ  9 cm to the right of the umbilicus and  3  cm above the umbilicus. ?  ?*Initial mark was higher and was moved down once patient moved position and I could see more clearly how close this was to creasing and her waist band. Please use lower mark that is covered with transparent film.  ? ?[facility] Nurse team will follow up with patient after surgery for continue ostomy care and teaching.  ? ?Cathlean Marseilles Tamala Julian, MSN, RN, CMSRN, AGCNS, WTA ?Wound Treatment Associate ?Pager [phone]  ?  ?

## 2021-08-09 NOTE — Progress Notes (Signed)
For Short Stay: ?[facility] appointment date:08/22/21 @ 1:30 PM ?Date of COVID positive in last 90 days: N/A ?COVID Vaccine: Moderna x 5: 05/08/21 ?Bowel Prep reminder: Reviewed ? ? ?For Anesthesia: ?PCP - Dr. Billey Gosling ?Cardiologist - N/A ? ?Chest x-ray -  ?EKG -  ?Stress Test -  ?ECHO -  ?Cardiac Cath -  ?Pacemaker/ICD device last checked: ?Pacemaker orders received: ?Device Rep notified: ? ?Spinal Cord Stimulator: ? ?Sleep Study -  ?CPAP -  ? ?Fasting Blood Sugar -  ?Checks Blood Sugar _____ times a day ?Date and result of last Hgb A1c- ? ?Blood Thinner Instructions: ?Aspirin Instructions: ?Last Dose: ? ?Activity level: Can go up a flight of stairs and activities of daily living without stopping and without chest pain and/or shortness of breath ?  Able to exercise without chest pain and/or shortness of breath ?  Unable to go up a flight of stairs without chest pain and/or shortness of breath ?   ? ?Anesthesia review: Hx: HTN,Heart murmur ? ?Patient denies shortness of breath, fever, cough and chest pain at PAT appointment ? ? ?Patient verbalized understanding of instructions that were given to them at the PAT appointment. Patient was also instructed that they will need to review over the PAT instructions again at home before surgery.  ?

## 2021-08-16 ENCOUNTER — Other Ambulatory Visit: Payer: Self-pay

## 2021-08-16 ENCOUNTER — Ambulatory Visit (INDEPENDENT_AMBULATORY_CARE_PROVIDER_SITE_OTHER): Payer: Medicare Other

## 2021-08-16 VITALS — BP 124/78 | HR 60 | Temp 97.6°F | Ht 64.0 in | Wt 275.6 lb

## 2021-08-16 DIAGNOSIS — Z0000 Encounter for general adult medical examination without abnormal findings: Secondary | ICD-10-CM

## 2021-08-16 NOTE — Patient Instructions (Signed)
[name] , Thank you for taking time to come for your Medicare Wellness Visit. I appreciate your ongoing commitment to your health goals. Please review the following plan we discussed and let me know if I can assist you in the future.   Screening recommendations/referrals: Colonoscopy: 11/24/2018; due every 5 years Mammogram: 12/20/2020; due every 5 years Bone Density: 06/24/2016; due very 3 years (overdue) Recommended yearly ophthalmology/optometry visit for glaucoma screening and checkup Recommended yearly dental visit for hygiene and checkup  Vaccinations: Influenza vaccine: 05/08/2021 Pneumococcal vaccine: 05/15/2015, 11/19/2016 Tdap vaccine: 10/22/2010; due every 10 years (overdue) Shingles vaccine: 12/03/2018, 02/10/2019   Covid-19: 07/25/2019, 08/22/2019, 03/29/2020, 10/12/2020, 05/08/2021  Advanced directives: Advance directive discussed with you today. Even though you declined this today please call our office should you change your mind and we can give you the proper paperwork for you to fill out.  Conditions/risks identified: Yes; Client understands the importance of follow-up appointments with providers by attending scheduled visits and discussed goals to eat healthier, increase physical activity 5 times a week for 30 minutes each, exercise the brain by doing stimulating brain exercises (reading, adult coloring, crafting, listening to music, puzzles, etc.), socialize and enjoy life more, get enough sleep at least 8-9 hours average per night and make time for laughter.  Next appointment: Please schedule your next Medicare Wellness Visit with your Nurse Health Advisor in 1 year by calling [phone].   Preventive Care 21 Years and Older, Female Preventive care refers to lifestyle choices and visits with your health care provider that can promote health and wellness. What does preventive care include? A yearly physical exam. This is also called an annual well check. Dental exams once or  twice a year. Routine eye exams. Ask your health care provider how often you should have your eyes checked. Personal lifestyle choices, including: Daily care of your teeth and gums. Regular physical activity. Eating a healthy diet. Avoiding tobacco and drug use. Limiting alcohol use. Practicing safe sex. Taking low-dose aspirin every day. Taking vitamin and mineral supplements as recommended by your health care provider. What happens during an annual well check? The services and screenings done by your health care provider during your annual well check will depend on your age, overall health, lifestyle risk factors, and family history of disease. Counseling  Your health care provider may ask you questions about your: Alcohol use. Tobacco use. Drug use. Emotional well-being. Home and relationship well-being. Sexual activity. Eating habits. History of falls. Memory and ability to understand (cognition). Work and work Statistician. Reproductive health. Screening  You may have the following tests or measurements: Height, weight, and BMI. Blood pressure. Lipid and cholesterol levels. These may be checked every 5 years, or more frequently if you are over 66 years old. Skin check. Lung cancer screening. You may have this screening every year starting at age 37 if you have a 30-pack-year history of smoking and currently smoke or have quit within the past 15 years. Fecal occult blood test (FOBT) of the stool. You may have this test every year starting at age 11. Flexible sigmoidoscopy or colonoscopy. You may have a sigmoidoscopy every 5 years or a colonoscopy every 10 years starting at age 57. Hepatitis C blood test. Hepatitis B blood test. Sexually transmitted disease (STD) testing. Diabetes screening. This is done by checking your blood sugar (glucose) after you have not eaten for a while (fasting). You may have this done every 1-3 years. Bone density scan. This is done to screen for  osteoporosis. You may have this done starting at age 107. Mammogram. This may be done every 1-2 years. Talk to your health care provider about how often you should have regular mammograms. Talk with your health care provider about your test results, treatment options, and if necessary, the need for more tests. Vaccines  Your health care provider may recommend certain vaccines, such as: Influenza vaccine. This is recommended every year. Tetanus, diphtheria, and acellular pertussis (Tdap, Td) vaccine. You may need a Td booster every 10 years. Zoster vaccine. You may need this after age 70. Pneumococcal 13-valent conjugate (PCV13) vaccine. One dose is recommended after age 55. Pneumococcal polysaccharide (PPSV23) vaccine. One dose is recommended after age 15. Talk to your health care provider about which screenings and vaccines you need and how often you need them. This information is not intended to replace advice given to you by your health care provider. Make sure you discuss any questions you have with your health care provider. Document Released: 06/23/2015 Document Revised: 02/14/2016 Document Reviewed: 03/28/2015 Elsevier Interactive Patient Education  2017 [place] Prevention in the Home Falls can cause injuries. They can happen to people of all ages. There are many things you can do to make your home safe and to help prevent falls. What can I do on the outside of my home? Regularly fix the edges of walkways and driveways and fix any cracks. Remove anything that might make you trip as you walk through a door, such as a raised step or threshold. Trim any bushes or trees on the path to your home. Use bright outdoor lighting. Clear any walking paths of anything that might make someone trip, such as rocks or tools. Regularly check to see if handrails are loose or broken. Make sure that both sides of any steps have handrails. Any raised decks and porches should have guardrails on  the edges. Have any leaves, snow, or ice cleared regularly. Use sand or salt on walking paths during winter. Clean up any spills in your garage right away. This includes oil or grease spills. What can I do in the bathroom? Use night lights. Install grab bars by the toilet and in the tub and shower. Do not use towel bars as grab bars. Use non-skid mats or decals in the tub or shower. If you need to sit down in the shower, use a plastic, non-slip stool. Keep the floor dry. Clean up any water that spills on the floor as soon as it happens. Remove soap buildup in the tub or shower regularly. Attach bath mats securely with double-sided non-slip rug tape. Do not have throw rugs and other things on the floor that can make you trip. What can I do in the bedroom? Use night lights. Make sure that you have a light by your bed that is easy to reach. Do not use any sheets or blankets that are too big for your bed. They should not hang down onto the floor. Have a firm chair that has side arms. You can use this for support while you get dressed. Do not have throw rugs and other things on the floor that can make you trip. What can I do in the kitchen? Clean up any spills right away. Avoid walking on wet floors. Keep items that you use a lot in easy-to-reach places. If you need to reach something above you, use a strong step stool that has a grab bar. Keep electrical cords out of the way. Do not use  floor polish or wax that makes floors slippery. If you must use wax, use non-skid floor wax. Do not have throw rugs and other things on the floor that can make you trip. What can I do with my stairs? Do not leave any items on the stairs. Make sure that there are handrails on both sides of the stairs and use them. Fix handrails that are broken or loose. Make sure that handrails are as long as the stairways. Check any carpeting to make sure that it is firmly attached to the stairs. Fix any carpet that is loose  or worn. Avoid having throw rugs at the top or bottom of the stairs. If you do have throw rugs, attach them to the floor with carpet tape. Make sure that you have a light switch at the top of the stairs and the bottom of the stairs. If you do not have them, ask someone to add them for you. What else can I do to help prevent falls? Wear shoes that: Do not have high heels. Have rubber bottoms. Are comfortable and fit you well. Are closed at the toe. Do not wear sandals. If you use a stepladder: Make sure that it is fully opened. Do not climb a closed stepladder. Make sure that both sides of the stepladder are locked into place. Ask someone to hold it for you, if possible. Clearly mark and make sure that you can see: Any grab bars or handrails. First and last steps. Where the edge of each step is. Use tools that help you move around (mobility aids) if they are needed. These include: Canes. Walkers. Scooters. Crutches. Turn on the lights when you go into a dark area. Replace any light bulbs as soon as they burn out. Set up your furniture so you have a clear path. Avoid moving your furniture around. If any of your floors are uneven, fix them. If there are any pets around you, be aware of where they are. Review your medicines with your doctor. Some medicines can make you feel dizzy. This can increase your chance of falling. Ask your doctor what other things that you can do to help prevent falls. This information is not intended to replace advice given to you by your health care provider. Make sure you discuss any questions you have with your health care provider. Document Released: 03/23/2009 Document Revised: 11/02/2015 Document Reviewed: 07/01/2014 Elsevier Interactive Patient Education  2017 [employer].

## 2021-08-16 NOTE — Progress Notes (Signed)
Subjective:   Frances Gray is a 71 y.o. female who presents for Medicare Annual (Subsequent) preventive examination.  Review of Systems     Cardiac Risk Factors include: advanced age (>39mn, >>58women);diabetes mellitus;dyslipidemia;hypertension;obesity (BMI >30kg/m2)     Objective:    Today's Vitals   08/16/21 1117  BP: 124/78  Pulse: 60  Temp: 97.6 F (36.4 C)  TempSrc: Temporal  SpO2: 99%  Weight: 275 lb 9.6 oz (125 kg)  Height: '5\' 4"'$  (1.626 m)  PainSc: 0-No pain   Body mass index is 47.31 kg/m.  Advanced Directives 08/16/2021 08/09/2021 06/11/2021 01/29/2021 08/15/2020 09/24/2019 05/26/2018  Does Patient Have a Medical Advance Directive? No No No No No No No  Would patient like information on creating a medical advance directive? No - Patient declined - No - Patient declined Yes (MAU/Ambulatory/Procedural Areas - Information given) No - Patient declined No - Patient declined No - Patient declined  Pre-existing out of facility DNR order (yellow form or pink MOST form) - - - - - - -    Current Medications (verified) Outpatient Encounter Medications as of 08/16/2021  Medication Sig   Biotin 10 MG TABS Take 10 mg by mouth daily.   calcium carbonate (CALCIUM 600) 600 MG TABS tablet Take 1 tablet (600 mg total) by mouth 2 (two) times daily with a meal.   cetirizine (ZYRTEC) 10 MG tablet Take 10 mg by mouth daily as needed for allergies.   Cholecalciferol (VITAMIN D3) 2000 UNITS TABS Take 2,000 Units by mouth every morning.    finasteride (PROSCAR) 5 MG tablet Take 5 mg by mouth daily.   Fluocinolone Acetonide Scalp 0.01 % OIL Apply 1 mL topically every other day.   fluticasone (FLONASE) 50 MCG/ACT nasal spray Place 1 spray into both nostrils daily as needed for allergies or rhinitis.    losartan-hydrochlorothiazide (HYZAAR) 100-25 MG tablet TAKE 1 TABLET BY MOUTH EVERY DAY   Multiple Vitamin (MULTIVITAMIN) tablet Take 1 tablet by mouth every morning.    Multiple Vitamins-Minerals  (ZINC PO) Take 1 tablet by mouth daily.   rosuvastatin (CRESTOR) 5 MG tablet TAKE 1 TABLET BY MOUTH TWICE WEEKLY   sennosides-docusate sodium (SENOKOT-S) 8.6-50 MG tablet Take 1 tablet by mouth at bedtime. (Patient taking differently: Take 2 tablets by mouth at bedtime.)   No facility-administered encounter medications on file as of 08/16/2021.    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 Follow-up in 6 months with bilateral mammogram and (R) ultrasound   Breast  Ultrasound Right 09/02/13   There is a benign 3 mm complex cyst in the (R) breast at 9 o'clock   [place]   tumor removed   (benign)   KNEE ARTHROSCOPY     bilateral   LITHOTRIPSY  08/2011   R ureteral stone   POLYPECTOMY     TOTAL KNEE ARTHROPLASTY Left 11/16/2012   Procedure: TOTAL KNEE ARTHROPLASTY- left;  Surgeon: Johnny Bridge, MD;  Location: [facility];  Service: Orthopedics;  Laterality: Left;   TOTAL KNEE ARTHROPLASTY Right 12/06/2014   Procedure: TOTAL KNEE ARTHROPLASTY;  Surgeon: Marchia Bond, MD;  Location: [facility];  Service: Orthopedics;  Laterality: Right;   Family History  Problem Relation Age of Onset    Ovarian cancer Mother    Colon cancer Sister 32   Breast cancer Sister    Esophageal cancer Neg Hx    Colon polyps Neg Hx    Rectal cancer Neg Hx    Stomach cancer Neg Hx    Social History   Socioeconomic History   Marital status: Married    Spouse name: Not on file   Number of children: Not on file   Years of education: Not on file   Highest education level: Not on file  Occupational History   Occupation: retired  Tobacco Use   Smoking status: Former    Packs/day: 0.25    Years: 10.00    Pack years: 2.50    Types: Cigarettes   Smokeless tobacco: Never   Tobacco comments:    quit smoking 8yr ago  Vaping Use   Vaping Use: Never used  Substance and Sexual Activity   Alcohol use: No   Drug use: No   Sexual activity: Yes    Birth control/protection: Post-menopausal  Other Topics Concern   Not on file  Social History Narrative   Lives with spouse and g-son, temporarily mother in law (05/2014)   Social Determinants of Health   Financial Resource Strain: Low Risk    Difficulty of Paying Living Expenses: Not hard at all  Food Insecurity: No Food Insecurity   Worried About RCharity fundraiserin the Last Year: Never true   RSunolin the Last Year: Never true  Transportation Needs: No Transportation Needs   Lack of Transportation (Medical): No   Lack of Transportation (Non-Medical): No  Physical Activity: Sufficiently Active   Days of Exercise per Week: 5 days   Minutes of Exercise per Session: 30 min  Stress: No Stress Concern Present   Feeling of Stress : Not at all  Social Connections: Socially Integrated   Frequency of Communication with Friends and Family: More than three times a week   Frequency of Social Gatherings with Friends and Family: More than three times a week   Attends Religious Services: More than 4 times per year   Active Member of [employer] Organizations: Yes   Attends CMusic therapist More than 4 times per year   Marital  Status: Married    Tobacco Counseling Counseling given: Not Answered Tobacco comments: quit smoking [id number]   Clinical Intake:  Pre-visit preparation completed: Yes  Pain : No/denies pain Pain Score: 0-No pain     BMI - recorded: 47.31 Nutritional Status: BMI > 30  Obese Nutritional Risks: None Diabetes: Yes CBG done?: No Did pt. bring in CBG monitor from home?: No  How often do you need to have someone help you  when you read instructions, pamphlets, or other written materials from your doctor or pharmacy?: 1 - Never What is the last grade level you completed in school?: HSG; some college  Diabetic? yes  Interpreter Needed?: No  Information entered by :: Lisette Abu, LPN   Activities of Daily Living In your present state of health, do you have any difficulty performing the following activities: 08/16/2021 08/09/2021  Hearing? N N  Vision? N N  Difficulty concentrating or making decisions? N N  Walking or climbing stairs? N N  Dressing or bathing? N N  Doing errands, shopping? N N  Preparing Food and eating ? N -  Using the Toilet? N -  In the past six months, have you accidently leaked urine? N -  Do you have problems with loss of bowel control? N -  Managing your Medications? N -  Managing your Finances? N -  Housekeeping or managing your Housekeeping? N -  Some recent data might be hidden    Patient Care Team: Binnie Rail, MD as PCP - General (Internal Medicine) Festus Aloe, MD (Urology) Marchia Bond, MD (Orthopedic Surgery) Armandina Gemma, MD (General Surgery) Katy Apo, MD (Ophthalmology) Princess Bruins, MD (Obstetrics and Gynecology) Inda Castle, MD (Inactive) (Gastroenterology) Charlton Haws, [facility] as Pharmacist (Pharmacist)  Indicate any recent Medical Services you may have received from other than [facility] providers in the past year (date may be approximate).     Assessment:   This is a routine wellness examination  for Kenedie.  Hearing/Vision screen Hearing Screening - Comments:: Patient denied any hearing difficulty.   No hearing aids.  Vision Screening - Comments:: Patient does wear corrective lenses/contacts.   Eye exam done by: Katy Apo, MD.  Dietary issues and exercise activities discussed: Current Exercise Habits: Home exercise routine, Type of exercise: walking, Time (Minutes): 30, Frequency (Times/Week): 5, Weekly Exercise (Minutes/Week): 150, Intensity: Moderate, Exercise limited by: orthopedic condition(s)   Goals Addressed               This Visit's Progress     Patient Stated (pt-stated)        My goal is to watch my diet, increase my water intake.      Depression Screen PHQ 2/9 Scores 08/16/2021 08/15/2020 11/30/2019 11/25/2018 05/26/2018 05/21/2017 05/20/2016  PHQ - 2 Score 0 0 0 0 0 1 0  PHQ- 9 Score - - - - - 2 -    Fall Risk Fall Risk  08/16/2021 08/15/2020 11/30/2019 11/25/2018 05/26/2018  Falls in the past year? 0 1 0 0 0  Comment - - - - -  Number falls in past yr: 0 0 0 0 -  Injury with Fall? 0 0 0 - -  Risk for fall due to : No Fall Risks No Fall Risks No Fall Risks - -  Follow up Falls evaluation completed Falls evaluation completed - - -    FALL RISK PREVENTION PERTAINING TO THE HOME:  Any stairs in or around the home? No  If so, are there any without handrails? No  Home free of loose throw rugs in walkways, pet beds, electrical cords, etc? Yes  Adequate lighting in your home to reduce risk of falls? Yes   ASSISTIVE DEVICES UTILIZED TO PREVENT FALLS:  Life alert? No  Use of a cane, walker or w/c? No  Grab bars in the bathroom? Yes  Shower chair or bench in shower? Yes  Elevated toilet seat or a handicapped toilet? Yes  TIMED UP AND GO:  Was the test performed? Yes .  Length of time to ambulate 10 feet: 8 sec.   Gait steady and fast without use of assistive device  Cognitive Function: Normal cognitive status assessed by direct observation by this  Nurse Health Advisor. No abnormalities found.          Immunizations Immunization History  Administered Date(s) Administered   Fluad Quad(high Dose 65+) 05/08/2021   Influenza, High Dose Seasonal PF 05/15/2015, 05/20/2016, 02/27/2017, 05/26/2018, 03/17/2019, 03/29/2020   Influenza, Seasonal, Injecte, Preservative Fre 08/12/2012   Influenza,inj,Quad PF,6+ Mos 02/11/2013, 05/12/2014   Moderna SARS-COV2 Booster Vaccination 10/12/2020, 05/08/2021   Moderna Sars-Covid-2 Vaccination 07/25/2019, 08/22/2019, 03/29/2020   Pneumococcal Conjugate-13 05/15/2015   Pneumococcal Polysaccharide-23 06/10/2010, 11/19/2016   Zoster Recombinat (Shingrix) 12/03/2018, 02/10/2019   Zoster, Live 10/09/2010    TDAP status: Due, Education has been provided regarding the importance of this vaccine. Advised may receive this vaccine at local pharmacy or Health Dept. Aware to provide a copy of the vaccination record if obtained from local pharmacy or Health Dept. Verbalized acceptance and understanding.  Flu Vaccine status: Up to date  Pneumococcal vaccine status: Up to date  Covid-19 vaccine status: Completed vaccines  Qualifies for Shingles Vaccine? Yes   Zostavax completed Yes   Shingrix Completed?: Yes  Screening Tests Health Maintenance  Topic Date Due   DEXA SCAN  06/25/2019   OPHTHALMOLOGY EXAM  06/08/2021   COVID-19 Vaccine (4 - Booster for Moderna series) 07/03/2021   TETANUS/TDAP  11/21/2021 (Originally 10/21/2020)   FOOT EXAM  11/21/2021   HEMOGLOBIN A1C  02/09/2022   MAMMOGRAM  12/21/2022   COLONOSCOPY (Pts 45-102yr Insurance coverage will need to be confirmed)  11/24/2023   Pneumonia Vaccine 72 Years old  Completed   INFLUENZA VACCINE  Completed   Hepatitis C Screening  Completed   Zoster Vaccines- Shingrix  Completed   HPV VACCINES  Aged Out    Health Maintenance  Health Maintenance Due  Topic Date Due   DEXA SCAN  06/25/2019   OPHTHALMOLOGY EXAM  06/08/2021   COVID-19  Vaccine (4 - Booster for Moderna series) 07/03/2021    Colorectal cancer screening: Type of screening: Colonoscopy. Completed 11/24/2018. Repeat every 5 years  Mammogram status: Completed 12/20/2020. Repeat every year  Bone Density status: Completed 06/24/2016. Results reflect: Bone density results: OSTEOPENIA. Repeat every 2-3 years.  Lung Cancer Screening: (Low Dose CT Chest recommended if Age 72-80years, 30 pack-year currently smoking OR have quit w/in 15years.) does not qualify.   Lung Cancer Screening Referral: no  Additional Screening:  Hepatitis C Screening: does qualify; Completed yes  Vision Screening: Recommended annual ophthalmology exams for early detection of glaucoma and other disorders of the eye. Is the patient up to date with their annual eye exam?  Yes  Who is the provider or what is the name of the office in which the patient attends annual eye exams? GKaty Apo MD. If pt is not established with a provider, would they like to be referred to a provider to establish care? No .   Dental Screening: Recommended annual dental exams for proper oral hygiene  Community Resource Referral / Chronic Care Management: CRR required this visit?  No   CCM required this visit?  No      Plan:     I have personally reviewed and noted the following in the patients chart:   Medical and social history Use of alcohol, tobacco or illicit drugs  Current medications  and supplements including opioid prescriptions.  Functional ability and status Nutritional status Physical activity Advanced directives List of other physicians Hospitalizations, surgeries, and ER visits in previous 12 months Vitals Screenings to include cognitive, depression, and falls Referrals and appointments  In addition, I have reviewed and discussed with patient certain preventive protocols, quality metrics, and best practice recommendations. A written personalized care plan for preventive services as  well as general preventive health recommendations were provided to patient.     Sheral Flow, LPN   09/12/8097   Nurse Notes:  Hearing Screening - Comments:: Patient denied any hearing difficulty.   No hearing aids.  Vision Screening - Comments:: Patient does wear corrective lenses/contacts.   Eye exam done by: Katy Apo, MD.

## 2021-08-22 ENCOUNTER — Encounter (HOSPITAL_COMMUNITY)
Admission: RE | Admit: 2021-08-22 | Discharge: 2021-08-22 | Disposition: A | Discharge status: Home / Self Care | Payer: Medicare Other | Source: Ambulatory Visit | Attending: Surgery | Admitting: Surgery

## 2021-08-22 ENCOUNTER — Other Ambulatory Visit: Payer: Self-pay

## 2021-08-22 ENCOUNTER — Telehealth: Payer: Self-pay | Admitting: Gastroenterology

## 2021-08-22 DIAGNOSIS — K5720 Diverticulitis of large intestine with perforation and abscess without bleeding: Secondary | ICD-10-CM | POA: Diagnosis not present

## 2021-08-22 DIAGNOSIS — Z20822 Contact with and (suspected) exposure to covid-19: Secondary | ICD-10-CM | POA: Diagnosis not present

## 2021-08-22 DIAGNOSIS — E876 Hypokalemia: Secondary | ICD-10-CM | POA: Diagnosis not present

## 2021-08-22 DIAGNOSIS — E119 Type 2 diabetes mellitus without complications: Secondary | ICD-10-CM | POA: Diagnosis present

## 2021-08-22 DIAGNOSIS — I10 Essential (primary) hypertension: Secondary | ICD-10-CM | POA: Diagnosis not present

## 2021-08-22 DIAGNOSIS — Z01812 Encounter for preprocedural laboratory examination: Secondary | ICD-10-CM | POA: Insufficient documentation

## 2021-08-22 DIAGNOSIS — Z96653 Presence of artificial knee joint, bilateral: Secondary | ICD-10-CM | POA: Diagnosis present

## 2021-08-22 DIAGNOSIS — Z794 Long term (current) use of insulin: Secondary | ICD-10-CM | POA: Diagnosis not present

## 2021-08-22 DIAGNOSIS — Z79899 Other long term (current) drug therapy: Secondary | ICD-10-CM | POA: Diagnosis not present

## 2021-08-22 DIAGNOSIS — Z87891 Personal history of nicotine dependence: Secondary | ICD-10-CM | POA: Diagnosis not present

## 2021-08-22 DIAGNOSIS — K449 Diaphragmatic hernia without obstruction or gangrene: Secondary | ICD-10-CM | POA: Diagnosis not present

## 2021-08-22 DIAGNOSIS — M1990 Unspecified osteoarthritis, unspecified site: Secondary | ICD-10-CM | POA: Diagnosis not present

## 2021-08-22 DIAGNOSIS — K760 Fatty (change of) liver, not elsewhere classified: Secondary | ICD-10-CM | POA: Diagnosis present

## 2021-08-22 DIAGNOSIS — Z01818 Encounter for other preprocedural examination: Secondary | ICD-10-CM

## 2021-08-22 DIAGNOSIS — E559 Vitamin D deficiency, unspecified: Secondary | ICD-10-CM | POA: Diagnosis present

## 2021-08-22 DIAGNOSIS — K219 Gastro-esophageal reflux disease without esophagitis: Secondary | ICD-10-CM | POA: Diagnosis present

## 2021-08-22 DIAGNOSIS — N719 Inflammatory disease of uterus, unspecified: Secondary | ICD-10-CM | POA: Diagnosis present

## 2021-08-22 DIAGNOSIS — K567 Ileus, unspecified: Secondary | ICD-10-CM | POA: Diagnosis not present

## 2021-08-22 DIAGNOSIS — Z6841 Body Mass Index (BMI) 40.0 and over, adult: Secondary | ICD-10-CM | POA: Diagnosis not present

## 2021-08-22 DIAGNOSIS — Z888 Allergy status to other drugs, medicaments and biological substances status: Secondary | ICD-10-CM | POA: Diagnosis not present

## 2021-08-22 DIAGNOSIS — N289 Disorder of kidney and ureter, unspecified: Secondary | ICD-10-CM | POA: Diagnosis not present

## 2021-08-22 DIAGNOSIS — N736 Female pelvic peritoneal adhesions (postinfective): Secondary | ICD-10-CM | POA: Diagnosis not present

## 2021-08-22 DIAGNOSIS — K5792 Diverticulitis of intestine, part unspecified, without perforation or abscess without bleeding: Secondary | ICD-10-CM | POA: Diagnosis not present

## 2021-08-22 DIAGNOSIS — E785 Hyperlipidemia, unspecified: Secondary | ICD-10-CM | POA: Diagnosis present

## 2021-08-22 NOTE — Telephone Encounter (Signed)
Patient called and stated she was going to keep appointment for 3/20.  ?

## 2021-08-22 NOTE — Telephone Encounter (Signed)
Patient called regarding a follow up appointment scheduled 08/27/21 with Dr. Silverio Decamp states that she was told by Dr. Clyda Greener office that he would be communicating with Dr. Nyoka Cowden about her treatment plan, said she has an upcoming procedure at the hospital with Dr. Johney Maine and does not know if she should proceed with the procedure and or the follow up appointment. Please call to discuss further.  ?

## 2021-08-23 LAB — SARS CORONAVIRUS 2 (TAT 6-24 HRS): SARS Coronavirus 2: NEGATIVE

## 2021-08-23 NOTE — Anesthesia Preprocedure Evaluation (Addendum)
Anesthesia Evaluation  ?Patient identified by MRN, date of birth, ID band ?Patient awake ? ? ? ?Reviewed: ?Allergy & Precautions, NPO status , Patient's Chart, lab work & pertinent test results ? ?Airway ?Mallampati: II ? ?TM Distance: >3 FB ?Neck ROM: Full ? ? ? Dental ?no notable dental hx. ? ?  ?Pulmonary ?former smoker,  ?  ?Pulmonary exam normal ?breath sounds clear to auscultation ? ? ? ? ? ? Cardiovascular ?hypertension, Pt. on medications ?Normal cardiovascular exam ?Rhythm:Regular Rate:Normal ? ?ECG: NSR, rate 60 ?  ?Neuro/Psych ? Neuromuscular disease negative psych ROS  ? GI/Hepatic ?Neg liver ROS, hiatal hernia, Bowel prep,GERD  Controlled,  ?Endo/Other  ?Morbid obesityPRE-DM ? Renal/GU ?Renal disease  ? ?  ?Musculoskeletal ? ?(+) Arthritis ,  ? Abdominal ?(+) + obese,   ?Peds ? Hematology ?negative hematology ROS ?(+)   ?Anesthesia Other Findings ?diverticulitis ? Reproductive/Obstetrics ? ?  ? ? ? ? ? ? ? ? ? ? ? ? ? ?  ?  ? ? ? ? ? ? ? ?Anesthesia Physical ?Anesthesia Plan ? ?ASA: 3 ? ?Anesthesia Plan: General  ? ?Post-op Pain Management:   ? ?Induction: Intravenous ? ?PONV Risk Score and Plan: 4 or greater and Ondansetron, Dexamethasone, Treatment may vary due to age or medical condition and Propofol infusion ? ?Airway Management Planned: Oral ETT ? ?Additional Equipment:  ? ?Intra-op Plan:  ? ?Post-operative Plan: Extubation in OR ? ?Informed Consent: I have reviewed the patients History and Physical, chart, labs and discussed the procedure including the risks, benefits and alternatives for the proposed anesthesia with the patient or authorized representative who has indicated his/her understanding and acceptance.  ? ? ? ?Dental advisory given ? ?Plan Discussed with: CRNA ? ?Anesthesia Plan Comments:   ? ? ? ? ?Anesthesia Quick Evaluation ? ?

## 2021-08-24 ENCOUNTER — Encounter (HOSPITAL_COMMUNITY)
Admission: RE | Disposition: A | Discharge status: Home / Self Care | Payer: Self-pay | Source: Home / Self Care | Attending: Surgery

## 2021-08-24 ENCOUNTER — Encounter (HOSPITAL_COMMUNITY): Payer: Self-pay | Admitting: Surgery

## 2021-08-24 ENCOUNTER — Inpatient Hospital Stay (HOSPITAL_COMMUNITY)
Admission: RE | Admit: 2021-08-24 | Discharge: 2021-08-28 | DRG: 330 | Disposition: A | Discharge status: Home / Self Care | Payer: Medicare Other | Attending: Surgery | Admitting: Surgery

## 2021-08-24 ENCOUNTER — Other Ambulatory Visit: Payer: Self-pay

## 2021-08-24 ENCOUNTER — Inpatient Hospital Stay (HOSPITAL_COMMUNITY): Payer: Medicare Other | Admitting: Physician Assistant

## 2021-08-24 ENCOUNTER — Inpatient Hospital Stay (HOSPITAL_COMMUNITY): Payer: Medicare Other | Admitting: Anesthesiology

## 2021-08-24 ENCOUNTER — Inpatient Hospital Stay (HOSPITAL_COMMUNITY): Payer: Medicare Other

## 2021-08-24 DIAGNOSIS — K5720 Diverticulitis of large intestine with perforation and abscess without bleeding: Principal | ICD-10-CM | POA: Diagnosis present

## 2021-08-24 DIAGNOSIS — Z888 Allergy status to other drugs, medicaments and biological substances status: Secondary | ICD-10-CM

## 2021-08-24 DIAGNOSIS — Z87891 Personal history of nicotine dependence: Secondary | ICD-10-CM | POA: Diagnosis not present

## 2021-08-24 DIAGNOSIS — M1990 Unspecified osteoarthritis, unspecified site: Secondary | ICD-10-CM | POA: Diagnosis not present

## 2021-08-24 DIAGNOSIS — Z20822 Contact with and (suspected) exposure to covid-19: Secondary | ICD-10-CM | POA: Diagnosis present

## 2021-08-24 DIAGNOSIS — E119 Type 2 diabetes mellitus without complications: Secondary | ICD-10-CM | POA: Diagnosis present

## 2021-08-24 DIAGNOSIS — Z794 Long term (current) use of insulin: Secondary | ICD-10-CM | POA: Diagnosis not present

## 2021-08-24 DIAGNOSIS — N719 Inflammatory disease of uterus, unspecified: Secondary | ICD-10-CM | POA: Diagnosis present

## 2021-08-24 DIAGNOSIS — E1169 Type 2 diabetes mellitus with other specified complication: Secondary | ICD-10-CM

## 2021-08-24 DIAGNOSIS — I10 Essential (primary) hypertension: Secondary | ICD-10-CM | POA: Diagnosis present

## 2021-08-24 DIAGNOSIS — N289 Disorder of kidney and ureter, unspecified: Secondary | ICD-10-CM | POA: Diagnosis not present

## 2021-08-24 DIAGNOSIS — Z6841 Body Mass Index (BMI) 40.0 and over, adult: Secondary | ICD-10-CM

## 2021-08-24 DIAGNOSIS — M179 Osteoarthritis of knee, unspecified: Secondary | ICD-10-CM | POA: Diagnosis present

## 2021-08-24 DIAGNOSIS — E559 Vitamin D deficiency, unspecified: Secondary | ICD-10-CM | POA: Diagnosis present

## 2021-08-24 DIAGNOSIS — E785 Hyperlipidemia, unspecified: Secondary | ICD-10-CM | POA: Diagnosis present

## 2021-08-24 DIAGNOSIS — K760 Fatty (change of) liver, not elsewhere classified: Secondary | ICD-10-CM | POA: Diagnosis present

## 2021-08-24 DIAGNOSIS — Z79899 Other long term (current) drug therapy: Secondary | ICD-10-CM | POA: Diagnosis not present

## 2021-08-24 DIAGNOSIS — K567 Ileus, unspecified: Secondary | ICD-10-CM | POA: Diagnosis not present

## 2021-08-24 DIAGNOSIS — E876 Hypokalemia: Secondary | ICD-10-CM | POA: Diagnosis not present

## 2021-08-24 DIAGNOSIS — K449 Diaphragmatic hernia without obstruction or gangrene: Secondary | ICD-10-CM | POA: Diagnosis not present

## 2021-08-24 DIAGNOSIS — K5792 Diverticulitis of intestine, part unspecified, without perforation or abscess without bleeding: Secondary | ICD-10-CM | POA: Diagnosis not present

## 2021-08-24 DIAGNOSIS — Z96653 Presence of artificial knee joint, bilateral: Secondary | ICD-10-CM | POA: Diagnosis present

## 2021-08-24 DIAGNOSIS — K5732 Diverticulitis of large intestine without perforation or abscess without bleeding: Principal | ICD-10-CM | POA: Diagnosis present

## 2021-08-24 DIAGNOSIS — M8580 Other specified disorders of bone density and structure, unspecified site: Secondary | ICD-10-CM | POA: Diagnosis present

## 2021-08-24 DIAGNOSIS — K219 Gastro-esophageal reflux disease without esophagitis: Secondary | ICD-10-CM | POA: Diagnosis present

## 2021-08-24 DIAGNOSIS — M5432 Sciatica, left side: Secondary | ICD-10-CM | POA: Diagnosis present

## 2021-08-24 DIAGNOSIS — N736 Female pelvic peritoneal adhesions (postinfective): Secondary | ICD-10-CM | POA: Diagnosis not present

## 2021-08-24 HISTORY — PX: PROCTOSCOPY: SHX2266

## 2021-08-24 LAB — GLUCOSE, CAPILLARY
Glucose-Capillary: 206 mg/dL — ABNORMAL HIGH (ref 70–99)
Glucose-Capillary: 185 mg/dL — ABNORMAL HIGH (ref 70–99)
Glucose-Capillary: 168 mg/dL — ABNORMAL HIGH (ref 70–99)

## 2021-08-24 LAB — TYPE AND SCREEN
ABO/RH(D): O POS
Antibody Screen: NEGATIVE

## 2021-08-24 SURGERY — COLECTOMY, SIGMOID, ROBOT-ASSISTED
Anesthesia: General

## 2021-08-24 MED ORDER — ONDANSETRON HCL 4 MG PO TABS: 4.0000 mg | ORAL_TABLET | Freq: Four times a day (QID) | ORAL | Status: DC | PRN

## 2021-08-24 MED ORDER — ENSURE PRE-SURGERY PO LIQD
296.0000 mL | Freq: Once | ORAL | Status: DC
  Filled 2021-08-24: qty 296

## 2021-08-24 MED ORDER — BISACODYL 5 MG PO TBEC: 20.0000 mg | DELAYED_RELEASE_TABLET | Freq: Once | ORAL | Status: DC

## 2021-08-24 MED ORDER — LORATADINE 10 MG PO TABS
10.0000 mg | ORAL_TABLET | Freq: Every day | ORAL | Status: DC
  Administered 2021-08-25 – 2021-08-28 (×4): 10 mg via ORAL
  Filled 2021-08-24 (×4): qty 1

## 2021-08-24 MED ORDER — 0.9 % SODIUM CHLORIDE (POUR BTL) OPTIME
TOPICAL | Status: DC | PRN
  Administered 2021-08-24: 2000 mL

## 2021-08-24 MED ORDER — LOSARTAN POTASSIUM-HCTZ 100-25 MG PO TABS: 1.0000 | ORAL_TABLET | Freq: Every day | ORAL | Status: DC

## 2021-08-24 MED ORDER — PHENYLEPHRINE 40 MCG/ML (10ML) SYRINGE FOR IV PUSH (FOR BLOOD PRESSURE SUPPORT)
PREFILLED_SYRINGE | INTRAVENOUS | Status: AC
  Filled 2021-08-24: qty 20

## 2021-08-24 MED ORDER — GABAPENTIN 300 MG PO CAPS
300.0000 mg | ORAL_CAPSULE | ORAL | Status: AC
  Administered 2021-08-24: 300 mg via ORAL
  Filled 2021-08-24: qty 1

## 2021-08-24 MED ORDER — LIDOCAINE HCL (PF) 2 % IJ SOLN
INTRAMUSCULAR | Status: AC
  Filled 2021-08-24: qty 5

## 2021-08-24 MED ORDER — ORAL CARE MOUTH RINSE: 15.0000 mL | Freq: Once | OROMUCOSAL | Status: AC

## 2021-08-24 MED ORDER — PROPOFOL 10 MG/ML IV BOLUS
INTRAVENOUS | Status: AC
  Filled 2021-08-24: qty 20

## 2021-08-24 MED ORDER — POLYETHYLENE GLYCOL 3350 17 GM/SCOOP PO POWD: 1.0000 | Freq: Once | ORAL | Status: DC

## 2021-08-24 MED ORDER — ROCURONIUM BROMIDE 100 MG/10ML IV SOLN
INTRAVENOUS | Status: DC | PRN
  Administered 2021-08-24: 60 mg via INTRAVENOUS
  Administered 2021-08-24: 10 mg via INTRAVENOUS
  Administered 2021-08-24: 40 mg via INTRAVENOUS

## 2021-08-24 MED ORDER — LOSARTAN POTASSIUM 50 MG PO TABS
100.0000 mg | ORAL_TABLET | Freq: Every day | ORAL | Status: DC
  Administered 2021-08-25 – 2021-08-28 (×4): 100 mg via ORAL
  Filled 2021-08-24 (×4): qty 2

## 2021-08-24 MED ORDER — LACTATED RINGERS IV SOLN: INTRAVENOUS | Status: DC

## 2021-08-24 MED ORDER — LACTATED RINGERS IR SOLN
Status: DC | PRN
  Administered 2021-08-24: 2000 mL

## 2021-08-24 MED ORDER — ONDANSETRON HCL 4 MG/2ML IJ SOLN: 4.0000 mg | Freq: Once | INTRAMUSCULAR | Status: DC | PRN

## 2021-08-24 MED ORDER — HYDRALAZINE HCL 20 MG/ML IJ SOLN: 10.0000 mg | INTRAMUSCULAR | Status: DC | PRN

## 2021-08-24 MED ORDER — ALVIMOPAN 12 MG PO CAPS
12.0000 mg | ORAL_CAPSULE | Freq: Two times a day (BID) | ORAL | Status: DC
  Administered 2021-08-25 – 2021-08-28 (×7): 12 mg via ORAL
  Filled 2021-08-24 (×7): qty 1

## 2021-08-24 MED ORDER — FENTANYL CITRATE (PF) 100 MCG/2ML IJ SOLN
INTRAMUSCULAR | Status: AC
  Filled 2021-08-24: qty 2

## 2021-08-24 MED ORDER — CALCIUM POLYCARBOPHIL 625 MG PO TABS
625.0000 mg | ORAL_TABLET | Freq: Two times a day (BID) | ORAL | Status: DC
  Administered 2021-08-24 – 2021-08-28 (×8): 625 mg via ORAL
  Filled 2021-08-24 (×8): qty 1

## 2021-08-24 MED ORDER — AMISULPRIDE (ANTIEMETIC) 5 MG/2ML IV SOLN: 10.0000 mg | Freq: Once | INTRAVENOUS | Status: DC | PRN

## 2021-08-24 MED ORDER — ROCURONIUM BROMIDE 10 MG/ML (PF) SYRINGE
PREFILLED_SYRINGE | INTRAVENOUS | Status: AC
  Filled 2021-08-24: qty 10

## 2021-08-24 MED ORDER — ACETAMINOPHEN 500 MG PO TABS
1000.0000 mg | ORAL_TABLET | ORAL | Status: AC
  Administered 2021-08-24: 1000 mg via ORAL
  Filled 2021-08-24: qty 2

## 2021-08-24 MED ORDER — PROCHLORPERAZINE EDISYLATE 10 MG/2ML IJ SOLN: 5.0000 mg | Freq: Four times a day (QID) | INTRAMUSCULAR | Status: DC | PRN

## 2021-08-24 MED ORDER — LIP MEDEX EX OINT
1.0000 "application " | TOPICAL_OINTMENT | Freq: Two times a day (BID) | CUTANEOUS | Status: DC
  Administered 2021-08-24 – 2021-08-28 (×9): 1 via TOPICAL
  Filled 2021-08-24: qty 7

## 2021-08-24 MED ORDER — FINASTERIDE 5 MG PO TABS
5.0000 mg | ORAL_TABLET | Freq: Every day | ORAL | Status: DC
Start: 2021-08-24 — End: 2021-08-28
  Administered 2021-08-24 – 2021-08-28 (×5): 5 mg via ORAL
  Filled 2021-08-24 (×5): qty 1

## 2021-08-24 MED ORDER — ENOXAPARIN SODIUM 40 MG/0.4ML IJ SOSY
40.0000 mg | PREFILLED_SYRINGE | Freq: Once | INTRAMUSCULAR | Status: AC
  Administered 2021-08-24: 40 mg via SUBCUTANEOUS
  Filled 2021-08-24: qty 0.4

## 2021-08-24 MED ORDER — FENTANYL CITRATE (PF) 100 MCG/2ML IJ SOLN
INTRAMUSCULAR | Status: AC
Start: 2021-08-24 — End: ?
  Filled 2021-08-24: qty 2

## 2021-08-24 MED ORDER — FLUOCINOLONE ACETONIDE SCALP 0.01 % EX OIL: 1.0000 mL | TOPICAL_OIL | CUTANEOUS | Status: DC

## 2021-08-24 MED ORDER — BUPIVACAINE LIPOSOME 1.3 % IJ SUSP
INTRAMUSCULAR | Status: AC
  Filled 2021-08-24: qty 20

## 2021-08-24 MED ORDER — FLUTICASONE PROPIONATE 50 MCG/ACT NA SUSP: 1.0000 | Freq: Every day | NASAL | Status: DC | PRN

## 2021-08-24 MED ORDER — INSULIN ASPART 100 UNIT/ML IJ SOLN
0.0000 [IU] | Freq: Three times a day (TID) | INTRAMUSCULAR | Status: DC
  Administered 2021-08-24 – 2021-08-25 (×4): 3 [IU] via SUBCUTANEOUS
  Administered 2021-08-26 – 2021-08-27 (×2): 2 [IU] via SUBCUTANEOUS

## 2021-08-24 MED ORDER — METOPROLOL TARTRATE 5 MG/5ML IV SOLN: 5.0000 mg | Freq: Four times a day (QID) | INTRAVENOUS | Status: DC | PRN

## 2021-08-24 MED ORDER — ENSURE SURGERY PO LIQD
237.0000 mL | Freq: Two times a day (BID) | ORAL | Status: DC
  Administered 2021-08-25 – 2021-08-26 (×2): 237 mL via ORAL

## 2021-08-24 MED ORDER — KETOROLAC TROMETHAMINE 15 MG/ML IJ SOLN: 15.0000 mg | Freq: Once | INTRAMUSCULAR | Status: DC | PRN

## 2021-08-24 MED ORDER — INSULIN ASPART 100 UNIT/ML IJ SOLN
2.0000 [IU] | Freq: Once | INTRAMUSCULAR | Status: AC
  Administered 2021-08-24: 2 [IU] via SUBCUTANEOUS
  Filled 2021-08-24: qty 1

## 2021-08-24 MED ORDER — PROCHLORPERAZINE MALEATE 10 MG PO TABS
10.0000 mg | ORAL_TABLET | Freq: Four times a day (QID) | ORAL | Status: DC | PRN
  Filled 2021-08-24: qty 1

## 2021-08-24 MED ORDER — HYDROCHLOROTHIAZIDE 25 MG PO TABS
25.0000 mg | ORAL_TABLET | Freq: Every day | ORAL | Status: DC
  Administered 2021-08-25 – 2021-08-28 (×4): 25 mg via ORAL
  Filled 2021-08-24 (×4): qty 1

## 2021-08-24 MED ORDER — SODIUM CHLORIDE 0.9 % IV SOLN
2.0000 g | INTRAVENOUS | Status: AC
  Administered 2021-08-24: 2 g via INTRAVENOUS
  Filled 2021-08-24: qty 2

## 2021-08-24 MED ORDER — DIPHENHYDRAMINE HCL 12.5 MG/5ML PO ELIX
12.5000 mg | ORAL_SOLUTION | Freq: Four times a day (QID) | ORAL | Status: DC | PRN
Start: 2021-08-24 — End: 2021-08-28

## 2021-08-24 MED ORDER — LACTATED RINGERS IV SOLN: INTRAVENOUS | Status: AC

## 2021-08-24 MED ORDER — LACTATED RINGERS IV BOLUS
1000.0000 mL | Freq: Three times a day (TID) | INTRAVENOUS | Status: AC | PRN
Start: 2021-08-24 — End: 2021-08-26

## 2021-08-24 MED ORDER — FENTANYL CITRATE PF 50 MCG/ML IJ SOSY
25.0000 ug | PREFILLED_SYRINGE | INTRAMUSCULAR | Status: DC | PRN
  Administered 2021-08-24 (×2): 50 ug via INTRAVENOUS

## 2021-08-24 MED ORDER — ACETAMINOPHEN 500 MG PO TABS
1000.0000 mg | ORAL_TABLET | Freq: Four times a day (QID) | ORAL | Status: DC
  Administered 2021-08-24 – 2021-08-28 (×12): 1000 mg via ORAL
  Filled 2021-08-24 (×11): qty 2

## 2021-08-24 MED ORDER — METHOCARBAMOL 1000 MG/10ML IJ SOLN
1000.0000 mg | Freq: Four times a day (QID) | INTRAVENOUS | Status: DC | PRN
  Administered 2021-08-24: 1000 mg via INTRAVENOUS
  Filled 2021-08-24: qty 10
  Filled 2021-08-24: qty 1000

## 2021-08-24 MED ORDER — ONDANSETRON HCL 4 MG/2ML IJ SOLN
INTRAMUSCULAR | Status: DC | PRN
Start: 2021-08-24 — End: 2021-08-24
  Administered 2021-08-24: 4 mg via INTRAVENOUS

## 2021-08-24 MED ORDER — SODIUM CHLORIDE 0.9 % IV SOLN
2.0000 g | Freq: Two times a day (BID) | INTRAVENOUS | Status: AC
  Administered 2021-08-24: 2 g via INTRAVENOUS
  Filled 2021-08-24: qty 2

## 2021-08-24 MED ORDER — SUGAMMADEX SODIUM 500 MG/5ML IV SOLN
INTRAVENOUS | Status: AC
  Filled 2021-08-24: qty 5

## 2021-08-24 MED ORDER — TRAMADOL HCL 50 MG PO TABS
50.0000 mg | ORAL_TABLET | Freq: Four times a day (QID) | ORAL | Status: DC | PRN
  Administered 2021-08-24 (×2): 50 mg via ORAL
  Administered 2021-08-25: 100 mg via ORAL
  Administered 2021-08-25: 50 mg via ORAL
  Administered 2021-08-25 – 2021-08-28 (×8): 100 mg via ORAL
  Filled 2021-08-24: qty 2
  Filled 2021-08-24: qty 1
  Filled 2021-08-24: qty 2
  Filled 2021-08-24: qty 1
  Filled 2021-08-24 (×2): qty 2
  Filled 2021-08-24: qty 1
  Filled 2021-08-24 (×5): qty 2

## 2021-08-24 MED ORDER — HYDROMORPHONE HCL 1 MG/ML IJ SOLN
0.5000 mg | INTRAMUSCULAR | Status: DC | PRN
  Administered 2021-08-24 – 2021-08-25 (×2): 1 mg via INTRAVENOUS
  Filled 2021-08-24 (×2): qty 1

## 2021-08-24 MED ORDER — ENSURE PRE-SURGERY PO LIQD
592.0000 mL | Freq: Once | ORAL | Status: DC
  Filled 2021-08-24: qty 592

## 2021-08-24 MED ORDER — NEOMYCIN SULFATE 500 MG PO TABS
1000.0000 mg | ORAL_TABLET | ORAL | Status: DC
Start: 2021-08-24 — End: 2021-08-24

## 2021-08-24 MED ORDER — SUGAMMADEX SODIUM 200 MG/2ML IV SOLN
INTRAVENOUS | Status: DC | PRN
  Administered 2021-08-24: 300 mg via INTRAVENOUS

## 2021-08-24 MED ORDER — BUPIVACAINE-EPINEPHRINE (PF) 0.25% -1:200000 IJ SOLN
INTRAMUSCULAR | Status: DC | PRN
  Administered 2021-08-24: 60 mL

## 2021-08-24 MED ORDER — DEXAMETHASONE SODIUM PHOSPHATE 10 MG/ML IJ SOLN
INTRAMUSCULAR | Status: AC
  Filled 2021-08-24: qty 1

## 2021-08-24 MED ORDER — FENTANYL CITRATE PF 50 MCG/ML IJ SOSY
PREFILLED_SYRINGE | INTRAMUSCULAR | Status: AC
  Filled 2021-08-24: qty 2

## 2021-08-24 MED ORDER — SIMETHICONE 80 MG PO CHEW
40.0000 mg | CHEWABLE_TABLET | Freq: Four times a day (QID) | ORAL | Status: DC | PRN
  Administered 2021-08-27: 40 mg via ORAL
  Filled 2021-08-24: qty 1

## 2021-08-24 MED ORDER — STERILE WATER FOR INJECTION IJ SOLN
INTRAMUSCULAR | Status: AC
  Filled 2021-08-24: qty 10

## 2021-08-24 MED ORDER — PHENYLEPHRINE HCL (PRESSORS) 10 MG/ML IV SOLN
INTRAVENOUS | Status: AC
  Filled 2021-08-24: qty 1

## 2021-08-24 MED ORDER — TRAMADOL HCL 50 MG PO TABS: 50.0000 mg | ORAL_TABLET | Freq: Four times a day (QID) | ORAL | 0 refills | Status: DC | PRN

## 2021-08-24 MED ORDER — METRONIDAZOLE 500 MG PO TABS: 1000.0000 mg | ORAL_TABLET | ORAL | Status: DC

## 2021-08-24 MED ORDER — ALUM & MAG HYDROXIDE-SIMETH 200-200-20 MG/5ML PO SUSP: 30.0000 mL | Freq: Four times a day (QID) | ORAL | Status: DC | PRN

## 2021-08-24 MED ORDER — INSULIN ASPART 100 UNIT/ML IJ SOLN
0.0000 [IU] | Freq: Every day | INTRAMUSCULAR | Status: DC
  Administered 2021-08-25: 3 [IU] via SUBCUTANEOUS

## 2021-08-24 MED ORDER — LIDOCAINE HCL (PF) 2 % IJ SOLN
INTRAMUSCULAR | Status: DC | PRN
  Administered 2021-08-24: 1.5 mg/kg/h via INTRADERMAL

## 2021-08-24 MED ORDER — PHENYLEPHRINE 40 MCG/ML (10ML) SYRINGE FOR IV PUSH (FOR BLOOD PRESSURE SUPPORT)
PREFILLED_SYRINGE | INTRAVENOUS | Status: DC | PRN
  Administered 2021-08-24 (×4): 80 ug via INTRAVENOUS

## 2021-08-24 MED ORDER — DEXAMETHASONE SODIUM PHOSPHATE 10 MG/ML IJ SOLN
INTRAMUSCULAR | Status: DC | PRN
  Administered 2021-08-24: 8 mg via INTRAVENOUS

## 2021-08-24 MED ORDER — FENTANYL CITRATE (PF) 100 MCG/2ML IJ SOLN
INTRAMUSCULAR | Status: DC | PRN
  Administered 2021-08-24: 100 ug via INTRAVENOUS
  Administered 2021-08-24: 50 ug via INTRAVENOUS
  Administered 2021-08-24: 100 ug via INTRAVENOUS
  Administered 2021-08-24: 50 ug via INTRAVENOUS

## 2021-08-24 MED ORDER — ALVIMOPAN 12 MG PO CAPS
12.0000 mg | ORAL_CAPSULE | ORAL | Status: AC
  Administered 2021-08-24: 12 mg via ORAL
  Filled 2021-08-24: qty 1

## 2021-08-24 MED ORDER — BUPIVACAINE LIPOSOME 1.3 % IJ SUSP: 20.0000 mL | Freq: Once | INTRAMUSCULAR | Status: DC

## 2021-08-24 MED ORDER — DIPHENHYDRAMINE HCL 50 MG/ML IJ SOLN: 12.5000 mg | Freq: Four times a day (QID) | INTRAMUSCULAR | Status: DC | PRN

## 2021-08-24 MED ORDER — ONDANSETRON HCL 4 MG/2ML IJ SOLN
4.0000 mg | Freq: Four times a day (QID) | INTRAMUSCULAR | Status: DC | PRN
  Administered 2021-08-24: 4 mg via INTRAVENOUS
  Filled 2021-08-24: qty 2

## 2021-08-24 MED ORDER — VITAMIN D 25 MCG (1000 UNIT) PO TABS
2000.0000 [IU] | ORAL_TABLET | Freq: Every morning | ORAL | Status: DC
  Administered 2021-08-25 – 2021-08-28 (×4): 2000 [IU] via ORAL
  Filled 2021-08-24 (×4): qty 2

## 2021-08-24 MED ORDER — BIOTIN 10 MG PO TABS: 10.0000 mg | ORAL_TABLET | Freq: Every day | ORAL | Status: DC

## 2021-08-24 MED ORDER — ONDANSETRON HCL 4 MG/2ML IJ SOLN
INTRAMUSCULAR | Status: AC
  Filled 2021-08-24: qty 2

## 2021-08-24 MED ORDER — CALCIUM CARBONATE 1250 (500 CA) MG PO TABS
1250.0000 mg | ORAL_TABLET | Freq: Two times a day (BID) | ORAL | Status: DC
  Administered 2021-08-24 – 2021-08-28 (×8): 1250 mg via ORAL
  Filled 2021-08-24 (×8): qty 1

## 2021-08-24 MED ORDER — PHENYLEPHRINE 40 MCG/ML (10ML) SYRINGE FOR IV PUSH (FOR BLOOD PRESSURE SUPPORT)
PREFILLED_SYRINGE | INTRAVENOUS | Status: AC
  Filled 2021-08-24: qty 10

## 2021-08-24 MED ORDER — PROPOFOL 10 MG/ML IV BOLUS
INTRAVENOUS | Status: DC | PRN
  Administered 2021-08-24: 200 mg via INTRAVENOUS

## 2021-08-24 MED ORDER — LIDOCAINE HCL (CARDIAC) PF 100 MG/5ML IV SOSY
PREFILLED_SYRINGE | INTRAVENOUS | Status: DC | PRN
  Administered 2021-08-24: 60 mg via INTRAVENOUS

## 2021-08-24 MED ORDER — BUPIVACAINE-EPINEPHRINE (PF) 0.25% -1:200000 IJ SOLN
INTRAMUSCULAR | Status: AC
  Filled 2021-08-24: qty 60

## 2021-08-24 MED ORDER — MAGIC MOUTHWASH
15.0000 mL | Freq: Four times a day (QID) | ORAL | Status: DC | PRN
Start: 2021-08-24 — End: 2021-08-28
  Filled 2021-08-24: qty 15

## 2021-08-24 MED ORDER — ENALAPRILAT 1.25 MG/ML IV SOLN
0.6250 mg | Freq: Four times a day (QID) | INTRAVENOUS | Status: DC | PRN
  Filled 2021-08-24: qty 1

## 2021-08-24 MED ORDER — STERILE WATER FOR INJECTION IJ SOLN
INTRAMUSCULAR | Status: DC | PRN
  Administered 2021-08-24: 15 mL

## 2021-08-24 MED ORDER — MELATONIN 3 MG PO TABS
3.0000 mg | ORAL_TABLET | Freq: Every evening | ORAL | Status: DC | PRN
  Administered 2021-08-25: 3 mg via ORAL
  Filled 2021-08-24: qty 1

## 2021-08-24 MED ORDER — CHLORHEXIDINE GLUCONATE 0.12 % MT SOLN
15.0000 mL | Freq: Once | OROMUCOSAL | Status: AC
  Administered 2021-08-24: 15 mL via OROMUCOSAL

## 2021-08-24 MED ORDER — PHENYLEPHRINE HCL-NACL 20-0.9 MG/250ML-% IV SOLN
INTRAVENOUS | Status: DC | PRN
  Administered 2021-08-24: 40 ug/min via INTRAVENOUS

## 2021-08-24 MED ORDER — BUPIVACAINE LIPOSOME 1.3 % IJ SUSP
INTRAMUSCULAR | Status: DC | PRN
  Administered 2021-08-24: 20 mL

## 2021-08-24 MED ORDER — IOHEXOL 300 MG/ML  SOLN
INTRAMUSCULAR | Status: DC | PRN
  Administered 2021-08-24: 10 mL via URETHRAL

## 2021-08-24 MED ORDER — ENOXAPARIN SODIUM 40 MG/0.4ML IJ SOSY
40.0000 mg | PREFILLED_SYRINGE | INTRAMUSCULAR | Status: DC
  Administered 2021-08-25 – 2021-08-28 (×4): 40 mg via SUBCUTANEOUS
  Filled 2021-08-24 (×4): qty 0.4

## 2021-08-24 SURGICAL SUPPLY — 125 items
ADAPTER GOLDBERG URETERAL (ADAPTER) IMPLANT
APPLIER CLIP 5 13 M/L LIGAMAX5 (MISCELLANEOUS)
APPLIER CLIP ROT 10 11.4 M/L (STAPLE)
BAG COUNTER SPONGE SURGICOUNT (BAG) ×3 IMPLANT
BAG URO CATCHER STRL LF (MISCELLANEOUS) ×3 IMPLANT
BLADE EXTENDED COATED 6.5IN (ELECTRODE) IMPLANT
CANNULA REDUC XI 12-8 STAPL (CANNULA)
CANNULA REDUCER 12-8 DVNC XI (CANNULA) IMPLANT
CATH URETL OPEN END 6FR 70 (CATHETERS) ×2 IMPLANT
CELLS DAT CNTRL 66122 CELL SVR (MISCELLANEOUS) IMPLANT
CHLORAPREP W/TINT 26 (MISCELLANEOUS) IMPLANT
CLIP APPLIE 5 13 M/L LIGAMAX5 (MISCELLANEOUS) IMPLANT
CLIP APPLIE ROT 10 11.4 M/L (STAPLE) IMPLANT
CLOTH BEACON ORANGE TIMEOUT ST (SAFETY) ×3 IMPLANT
COVER SURGICAL LIGHT HANDLE (MISCELLANEOUS) ×6 IMPLANT
COVER TIP SHEARS 8 DVNC (MISCELLANEOUS) ×2 IMPLANT
COVER TIP SHEARS 8MM DA VINCI (MISCELLANEOUS) ×1
DEFOGGER SCOPE WARMER CLEARIFY (MISCELLANEOUS) ×1 IMPLANT
DEVICE TROCAR PUNCTURE CLOSURE (ENDOMECHANICALS) IMPLANT
DRAIN CHANNEL 19F RND (DRAIN) IMPLANT
DRAPE ARM DVNC X/XI (DISPOSABLE) ×8 IMPLANT
DRAPE COLUMN DVNC XI (DISPOSABLE) ×2 IMPLANT
DRAPE DA VINCI XI ARM (DISPOSABLE) ×4
DRAPE DA VINCI XI COLUMN (DISPOSABLE) ×1
DRAPE SURG IRRIG POUCH 19X23 (DRAPES) ×3 IMPLANT
DRSG OPSITE POSTOP 4X10 (GAUZE/BANDAGES/DRESSINGS) IMPLANT
DRSG OPSITE POSTOP 4X6 (GAUZE/BANDAGES/DRESSINGS) ×1 IMPLANT
DRSG OPSITE POSTOP 4X8 (GAUZE/BANDAGES/DRESSINGS) IMPLANT
DRSG TEGADERM 2-3/8X2-3/4 SM (GAUZE/BANDAGES/DRESSINGS) ×11 IMPLANT
DRSG TEGADERM 4X4.75 (GAUZE/BANDAGES/DRESSINGS) IMPLANT
ELECT PENCIL ROCKER SW 15FT (MISCELLANEOUS) ×3 IMPLANT
ELECT REM PT RETURN 15FT ADLT (MISCELLANEOUS) ×3 IMPLANT
ENDOLOOP SUT PDS II  0 18 (SUTURE)
ENDOLOOP SUT PDS II 0 18 (SUTURE) IMPLANT
EVACUATOR SILICONE 100CC (DRAIN) IMPLANT
GAUZE SPONGE 2X2 8PLY STRL LF (GAUZE/BANDAGES/DRESSINGS) ×2 IMPLANT
GLOVE SURG ENC TEXT LTX SZ7.5 (GLOVE) ×3 IMPLANT
GLOVE SURG NEOPR MICRO LF SZ8 (GLOVE) ×9 IMPLANT
GLOVE SURG UNDER LTX SZ8 (GLOVE) ×9 IMPLANT
GOWN STRL REUS W/ TWL XL LVL3 (GOWN DISPOSABLE) ×8 IMPLANT
GOWN STRL REUS W/TWL XL LVL3 (GOWN DISPOSABLE) ×4
GRASPER SUT TROCAR 14GX15 (MISCELLANEOUS) IMPLANT
GUIDEWIRE ANG ZIPWIRE 038X150 (WIRE) ×1 IMPLANT
GUIDEWIRE STR DUAL SENSOR (WIRE) IMPLANT
HOLDER FOLEY CATH W/STRAP (MISCELLANEOUS) ×3 IMPLANT
IRRIG SUCT STRYKERFLOW 2 WTIP (MISCELLANEOUS) ×3
IRRIGATION SUCT STRKRFLW 2 WTP (MISCELLANEOUS) ×2 IMPLANT
KIT PROCEDURE DA VINCI SI (MISCELLANEOUS) ×1
KIT PROCEDURE DVNC SI (MISCELLANEOUS) ×2 IMPLANT
KIT SIGMOIDOSCOPE (SET/KITS/TRAYS/PACK) ×1 IMPLANT
KIT TURNOVER KIT A (KITS) IMPLANT
MANIFOLD NEPTUNE II (INSTRUMENTS) ×3 IMPLANT
NDL INSUFFLATION 14GA 120MM (NEEDLE) ×2 IMPLANT
NEEDLE INSUFFLATION 14GA 120MM (NEEDLE) ×3 IMPLANT
PACK CARDIOVASCULAR III (CUSTOM PROCEDURE TRAY) ×3 IMPLANT
PACK COLON (CUSTOM PROCEDURE TRAY) ×3 IMPLANT
PACK CYSTO (CUSTOM PROCEDURE TRAY) ×3 IMPLANT
PAD POSITIONING PINK XL (MISCELLANEOUS) ×3 IMPLANT
PROTECTOR NERVE ULNAR (MISCELLANEOUS) ×6 IMPLANT
RELOAD STAPLE 45 3.5 BLU DVNC (STAPLE) IMPLANT
RELOAD STAPLE 45 4.3 GRN DVNC (STAPLE) IMPLANT
RELOAD STAPLE 60 3.5 BLU DVNC (STAPLE) IMPLANT
RELOAD STAPLE 60 4.3 GRN DVNC (STAPLE) IMPLANT
RELOAD STAPLER 3.5X45 BLU DVNC (STAPLE) IMPLANT
RELOAD STAPLER 3.5X60 BLU DVNC (STAPLE) IMPLANT
RELOAD STAPLER 4.3X45 GRN DVNC (STAPLE) IMPLANT
RELOAD STAPLER 4.3X60 GRN DVNC (STAPLE) ×4 IMPLANT
RETRACTOR WND ALEXIS 18 MED (MISCELLANEOUS) IMPLANT
RTRCTR WOUND ALEXIS 18CM MED (MISCELLANEOUS)
SCISSORS LAP 5X35 DISP (ENDOMECHANICALS) ×3 IMPLANT
SEAL CANN UNIV 5-8 DVNC XI (MISCELLANEOUS) ×6 IMPLANT
SEAL XI 5MM-8MM UNIVERSAL (MISCELLANEOUS) ×3
SEALER VESSEL DA VINCI XI (MISCELLANEOUS) ×1
SEALER VESSEL EXT DVNC XI (MISCELLANEOUS) ×2 IMPLANT
SOLUTION ELECTROLUBE (MISCELLANEOUS) ×3 IMPLANT
SPIKE FLUID TRANSFER (MISCELLANEOUS) ×2 IMPLANT
SPONGE GAUZE 2X2 STER 10/PKG (GAUZE/BANDAGES/DRESSINGS) ×1
STAPLER 45 DA VINCI SURE FORM (STAPLE)
STAPLER 45 SUREFORM DVNC (STAPLE) IMPLANT
STAPLER 60 DA VINCI SURE FORM (STAPLE) ×1
STAPLER 60 SUREFORM DVNC (STAPLE) IMPLANT
STAPLER CANNULA SEAL DVNC XI (STAPLE) ×2 IMPLANT
STAPLER CANNULA SEAL XI (STAPLE) ×1
STAPLER ECHELON POWER CIR 29 (STAPLE) ×1 IMPLANT
STAPLER ECHELON POWER CIR 31 (STAPLE) IMPLANT
STAPLER RELOAD 3.5X45 BLU DVNC (STAPLE)
STAPLER RELOAD 3.5X45 BLUE (STAPLE)
STAPLER RELOAD 3.5X60 BLU DVNC (STAPLE)
STAPLER RELOAD 3.5X60 BLUE (STAPLE)
STAPLER RELOAD 4.3X45 GREEN (STAPLE)
STAPLER RELOAD 4.3X45 GRN DVNC (STAPLE)
STAPLER RELOAD 4.3X60 GREEN (STAPLE) ×2
STAPLER RELOAD 4.3X60 GRN DVNC (STAPLE) ×4
STOPCOCK 4 WAY LG BORE MALE ST (IV SETS) ×6 IMPLANT
SURGILUBE 2OZ TUBE FLIPTOP (MISCELLANEOUS) IMPLANT
SUT MNCRL AB 4-0 PS2 18 (SUTURE) ×3 IMPLANT
SUT PDS AB 1 CT1 27 (SUTURE) ×6 IMPLANT
SUT PROLENE 0 CT 2 (SUTURE) IMPLANT
SUT PROLENE 2 0 KS (SUTURE) IMPLANT
SUT PROLENE 2 0 SH DA (SUTURE) IMPLANT
SUT SILK 2 0 (SUTURE)
SUT SILK 2 0 SH CR/8 (SUTURE) IMPLANT
SUT SILK 2-0 18XBRD TIE 12 (SUTURE) IMPLANT
SUT SILK 3 0 (SUTURE)
SUT SILK 3 0 SH CR/8 (SUTURE) ×3 IMPLANT
SUT SILK 3-0 18XBRD TIE 12 (SUTURE) IMPLANT
SUT V-LOC BARB 180 2/0GR6 GS22 (SUTURE)
SUT VIC AB 3-0 SH 18 (SUTURE) IMPLANT
SUT VIC AB 3-0 SH 27 (SUTURE)
SUT VIC AB 3-0 SH 27XBRD (SUTURE) IMPLANT
SUT VICRYL 0 UR6 27IN ABS (SUTURE) ×3 IMPLANT
SUTURE V-LC BRB 180 2/0GR6GS22 (SUTURE) IMPLANT
SYR 10ML ECCENTRIC (SYRINGE) ×3 IMPLANT
SYS LAPSCP GELPORT 120MM (MISCELLANEOUS)
SYS WOUND ALEXIS 18CM MED (MISCELLANEOUS) ×3
SYSTEM LAPSCP GELPORT 120MM (MISCELLANEOUS) IMPLANT
SYSTEM WOUND ALEXIS 18CM MED (MISCELLANEOUS) ×2 IMPLANT
TOWEL OR NON WOVEN STRL DISP B (DISPOSABLE) ×3 IMPLANT
TRAY FOLEY MTR SLVR 16FR STAT (SET/KITS/TRAYS/PACK) ×2 IMPLANT
TRAY FOLEY W/BAG SLVR 14FR LF (SET/KITS/TRAYS/PACK) ×1 IMPLANT
TROCAR ADV FIXATION 5X100MM (TROCAR) ×3 IMPLANT
TROCAR BLADELESS OPT 5 150 (ENDOMECHANICALS) ×1 IMPLANT
TUBING CONNECTING 10 (TUBING) ×9 IMPLANT
TUBING INSUFFLATION 10FT LAP (TUBING) ×3 IMPLANT
TUBING UROLOGY SET (TUBING) IMPLANT

## 2021-08-24 NOTE — Op Note (Signed)
NAME: Frances Gray, Frances Gray ?MEDICAL RECORD NO: [id number] ?ACCOUNT NO: [id number] ?DATE OF BIRTH: 02/24/1951 ?FACILITY: [facility] ?LOCATION: [facility] ?PHYSICIAN: Alexis Frock, MD ? ?Operative Report  ? ?DATE OF PROCEDURE: 08/24/2021 ? ?PREOPERATIVE DIAGNOSIS: Severe diverticulitis. ? ?PROCEDURES PERFORMED: ?1.  Cystoscopy with bilateral retrograde pyelograms interpretation. ?2.  Insertion of bilateral externalized ureteral stents. ? ?ESTIMATED BLOOD LOSS:  Nil. ? ?COMPLICATIONS:  None. ? ?SPECIMEN:  None. ? ?FINDINGS: ?1.  Unremarkable, bilateral retrogrades. ?2.  Successful placement of bilateral externalized ureteral stents connected to [facility] adapter. ? ?INDICATIONS:  The patient is a 71 year old lady with history of morbid obesity and recurrent diverticulitis.  She is undergoing segmental colon resection today under the care of the general surgery team.  She does have a history of pelvic abscess and  ?phlegmon with inflammatory response very near the course of her left ureter.  General Surgery team has requested perioperative stenting and marking for ureteral identification and protection.  Imaging is reviewed.  Informed consent was obtained and  ?placed in medical record. ? ?PROCEDURE IN DETAIL:  The patient was verified, procedure being cystoscopy with bilateral retrogrades, stenting was performed.  Procedure timeout was performed.  Intravenous antibiotics were administered.  General anesthesia was induced.  The patient was ? placed into a low lithotomy position.  Sterile field was created, prepped and draped the patient's vagina, introitus, and proximal thigh using iodine.  Cystourethroscopy was performed using 21-French rigid cystoscope with offset lens.  Inspection of  ?urinary bladder revealed no diverticula, calcifications or papillary lesions.  The right ureteral orifice was cannulated with a 6-French open-ended catheter, and right retrograde pyelogram was obtained using a slurry of contrast and ICG dye. Right   ?retrograde pyelogram demonstrated single right ureter, single system right kidney.  No filling defects or narrowing noted.  The open-ended catheter was then advanced to the level of the mid kidney under fluoroscopic guidance and set aside. Using a  ?separate open-ended catheter a left retrograde pyelogram was obtained. ? ?Left retrograde pyelogram demonstrated a single left ureter, single system left kidney.  No filling defects or narrowing noted.  Under fluoroscopic guidance, this open-ended catheter was advanced to the level of the mid portion of the kidney and left  ?externalized.  A Foley catheter was placed free to straight drain, 10 mL around the balloon.  The externalized stents were fashioned to this using silk tie x2.  All 3 modes of drains were connected to a [place] adapter, right ureter, left ureter, Foley  ?catheter respectively and these were connected to gravity drainage, procedure was terminated.  The patient tolerated the procedure well, no immediate periprocedural complications.  The patient remains in the OR for the remainder of her general surgery  ?portion of the procedure today. ? ? ?PUS ?D: 08/24/2021 1:09:49 pm T: 08/24/2021 2:22:00 pm  ?JOB: [id number]/ [id number]  ?

## 2021-08-24 NOTE — Plan of Care (Signed)
Pt transferred from [place] to [place] @ 1309 ?

## 2021-08-24 NOTE — H&P (Signed)
?08/24/2021 ? ? ? ? ?REFERRING PHYSICIAN: Binnie Rail, MD ? ?Patient Care Team: ?Binnie Rail, MD as PCP - General (Internal Medicine) ?Miguel Dibble, MD as Consulting Provider (General Surgery) ? ?PROVIDER: Hollace Kinnier, MD ? ?[facility] MRN: [id number] ?DOB: [birth year] ?DATE OF ENCOUNTER: 07/17/2021 ? ?SUBJECTIVE  ? ?Chief Complaint: Intra- abdominal abscess ? ? ?History of Present Illness: ?Frances Gray is a 71 y.o. female who is seen today  ?as an office consultation at the request of Dr. Quay Burow  ?for evaluation of Intra- abdominal abscess ?.  ? ?71 year old woman with history of prior diverticulitis and abdominal pain. Came emergency room with CT scan showing evidence of diverticulitis with focal abscess. Was admitted and placed on antibiotics. Improved and discharged home with 2-week course of oral Augmentin. ? ?She comes today with her husband feeling better. She is completing all her Augmentin. Denies any fevers or chills. She thinks she has had at least 5 attacks with diverticulitis. The last 2 she had to be admitted. She is followed by [facility]. Had a colonoscopy in 2020 that noted 1 tubular adenoma and 1 hyperplastic polyp and moderate left-sided diverticulosis. No major stricture. 7-year follow-up recommended. She is morbidly obese but otherwise active and relatively independent. She had a tubal ligation but no prior abdominal surgeries. She can walk 1/2-hour without difficulty. She has not smoked in 20 years. She has been diagnosed with diabetes. Not on any medication. Trying to focus on a good diet. Last hemoglobin A1c 7.1. No sleep apnea. ? ?She claims she moves her bowels about once a day. Uses a stool softener sometimes. Denies any history of inflammatory bowel disease irritable bowel syndrome. No family history that she knows of. She has been on a soft diet and is gradually been back to regular diet. She notes the pain usually is in her left lower side. No major problems with  urination. No vaginal bleeding or discharge. No UTIs that she knows of. ? ?Medical History: ? ?Past Medical History:  ?Diagnosis Date  ? Arthritis  ? GERD (gastroesophageal reflux disease)  ? ?Patient Active Problem List  ?Diagnosis  ? Diabetes mellitus without complication (CMS-HCC)  ? Female pattern hair loss  ? Hepatic steatosis  ? Hiatal hernia  ? Hyperlipidemia  ? Hyperparathyroidism (CMS-HCC)  ? Hypertension  ? Kidney stone  ? Knee osteoarthritis  ? Lichen planopilaris  ? Melasma  ? Midline low back pain without sciatica  ? Multinodular goiter (nontoxic)  ? Nodule of flexor tendon sheath  ? Osteopenia  ? Perforation of sigmoid colon due to diverticulitis  ? Plantar fasciitis, right  ? Rib pain on right side  ? Sciatica of left side  ? Vitamin D deficiency disease  ? Abscess of sigmoid colon due to diverticulitis  ? Rectosigmoid diverticulitis  ? Body mass index (BMI) of 45.0 to 49.9 in adult (CMS-HCC)  ? ?Past Surgical History:  ?Procedure Laterality Date  ? REPLACEMENT TOTAL KNEE 2014  ?both 2016  ? ? ?Allergies  ?Allergen Reactions  ? Amlodipine Cough  ? Metformin Itching  ?diarrhea  ? Pravastatin Other (See Comments)  ?Joint ache ?Joint ache ? ? ?Current Outpatient Medications on File Prior to Visit  ?Medication Sig Dispense Refill  ? finasteride (PROSCAR) 5 mg tablet 5 mg daily.  ? fluocinolone (DERMA-SMOOTHE) 0.01 % external oil APPLY TO SCALP EVERY NIGHT AS NEEDED FOR AN ITCHY SCALP AS DIRECTED.  ? losartan-hydrochlorothiazide (HYZAAR) 100-25 mg tablet Take 1 tablet by mouth once daily  ?  zinc citrate-phytase (ZYTAZE) 25-500 mg capsule Take by mouth  ? biotin 10 mg Tab Take 10 mg by mouth once daily  ? cetirizine (ZYRTEC) 10 MG tablet Take by mouth  ? cholecalciferol (VITAMIN D3) 2,000 unit tablet Take 2,000 Units by mouth every morning  ? multivitamin tablet Take 1 tablet by mouth every morning  ? rosuvastatin (CRESTOR) 5 MG tablet TAKE 1 TABLET BY MOUTH TWICE WEEKLY  ? traMADoL (ULTRAM) 50 mg tablet  TAKE 1 TABLET (50 MG TOTAL) BY MOUTH EVERY 6 (SIX) HOURS AS NEEDED FOR UP TO 3 DAYS FOR SEVERE PAIN.  ? ?No current facility-administered medications on file prior to visit.  ? ?Family History  ?Problem Relation Age of Onset  ? Breast cancer Sister  ? Colon cancer Sister  ? ? ?Social History  ? ?Tobacco Use  ?Smoking Status Former  ? Types: Cigarettes  ?Smokeless Tobacco Never  ? ? ?Social History  ? ?Socioeconomic History  ? Marital status: Married  ?Tobacco Use  ? Smoking status: Former  ?Types: Cigarettes  ? Smokeless tobacco: Never  ?Substance and Sexual Activity  ? Alcohol use: Never  ? Drug use: Never  ? ?############################################################ ? ?Review of Systems: ?A complete review of systems (ROS) was obtained from the patient. I have reviewed this information and discussed as appropriate with the patient. See HPI as well for other pertinent ROS. ? ?Constitutional: No fevers, chills, sweats. Weight stable ?Eyes: No vision changes, No discharge ?HENT: No sore throats, nasal drainage ?Lymph: No neck swelling, No bruising easily ?Pulmonary: No cough, productive sputum ?CV: No orthopnea, PND Patient walks 30 minutes without difficulty. No exertional chest/neck/shoulder/arm pain. ? ?GI: No personal nor family history of GI/colon cancer, inflammatory bowel disease, irritable bowel syndrome, allergy such as Celiac Sprue, dietary/dairy problems, colitis, ulcers nor gastritis. No recent sick contacts/gastroenteritis. No travel outside the country. No changes in diet. ? ?Renal: No UTIs, No hematuria ?Genital: No drainage, bleeding, masses ?Musculoskeletal: No severe joint pain. Good ROM major joints ?Skin: No sores or lesions ?Heme/Lymph: No easy bleeding. No swollen lymph nodes ? ?OBJECTIVE  ? ?Vitals:  ?07/17/21 0853  ?BP: (!) 142/92  ?Pulse: 68  ?Temp: 36.3 ?C (97.4 ?F)  ?SpO2: 98%  ?Weight: (!) 124.5 kg (274 lb 6.4 oz)  ?Height: 162.6 cm ('5\' 4"'$ )  ?Body mass index is 47.1 kg/m?. ? ?BP (!)  158/85   Pulse 65   Temp 97.8 ?F (36.6 ?C) (Oral)   Resp 18   SpO2 98%  ?08/24/2021 ? ? ? ?PHYSICAL EXAM: ? ?Constitutional: Not cachectic. Hygeine adequate. Vitals signs as above.  ?Eyes: Pupils reactive, normal extraocular movements. Sclera nonicteric ?Neuro: CN II-XII intact. No major focal sensory defects. No major motor deficits. ?Lymph: No head/neck/groin lymphadenopathy ?Psych: No severe agitation. No severe anxiety. Judgment & insight Adequate, Oriented x4, ?HENT: Normocephalic, Mucus membranes moist. No thrush. Hearing: adequate ?Neck: Supple, No tracheal deviation. No obvious thyromegaly ?Chest: No pain to chest wall compression. Good respiratory excursion. No audible wheezing ?CV: Pulses intact. Regular rhythm. No major extremity edema ?Ext: No obvious deformity or contracture. Edema: Not present. No cyanosis ?Skin: No major subcutaneous nodules. Warm and dry ?Musculoskeletal: Severe joint rigidity not present. No obvious clubbing. No digital petechiae. Mobility: no assist device moving easily without restrictions ? ?Abdomen:  ?Obese with panniculus  ?Soft. Nondistended. Nontender. Hernia: Not present. Diastasis recti: Mild supraumbilical midline. No hepatomegaly. No splenomegaly. ? ?Genital/Pelvic: Inguinal hernia: Not present. Inguinal lymph nodes: without lymphadenopathy nor hidradenitis.  ? ?Rectal: (Deferred) ? ? ? ?################################################################### ? ?  Labs, Imaging and Diagnostic Testing: ? ?Located in [place]' section of Epic EMR chart ? ?PRIOR [facility] CLINIC NOTES: ? ?Located in [place]' section of Epic EMR chart ? ?SURGERY NOTES: ? ?Located in [place]' section of Epic EMR chart ? ?PATHOLOGY: ? ?Located in [place]' section of Epic EMR chart ? ?Assessment and Plan:  ?DIAGNOSES: ? ?Diagnoses and all orders for this visit: ? ?Rectosigmoid diverticulitis ? ?Abscess of sigmoid colon due to diverticulitis ? ?Body mass index (BMI) of  45.0 to 49.9 in adult (CMS-HCC) ? ?Diabetes mellitus without complication (CMS-HCC) ? ?Other orders ?- polyethylene glycol (MIRALAX) powder; Take 233.75 g by mouth once for 1 dose Take according to your procedure

## 2021-08-24 NOTE — Op Note (Signed)
08/24/2021  11:38 AM  PATIENT:  Frances Gray  71 y.o. female  Patient Care Team: Pincus Sanes, MD as PCP - General (Internal Medicine) [name], MD (Urology) Teryl Lucy, MD (Orthopedic Surgery) Darnell Level, MD (General Surgery) Antony Contras, MD (Ophthalmology) Genia Del, MD (Obstetrics and Gynecology) Louis Meckel, MD (Inactive) (Gastroenterology) Kathyrn Sheriff, [facility] as Pharmacist (Pharmacist) Karie Soda, MD as Consulting Physician (Colon and Rectal Surgery)  PRE-OPERATIVE DIAGNOSIS:  diverticulitis  POST-OPERATIVE DIAGNOSIS:  SIGMOID DIVERTICULITIS WITH COLO-UTERINE ABSCESS  PROCEDURE:  -ROBOTIC LOW ANTERIOR RECTOSIGMOID RESECTION -LEFT SALPINGO-OOPHORECTOMY -DRAINAGE OF COLO-UTERINE PELVIC ABSCESS -INTRAOPERATIVE ASSESSMENT OF TISSUE VASCULAR PERFUSION USING ICG (indocyanine green) IMMUNOFLUORESCENCE -TRANSVERSUS ABDOMINIS PLANE (TAP) BLOCK - BILATERAL  SURGEON:  Ardeth Sportsman, MD  ASSISTANT: Marin Olp, MD An experienced assistant was required given the standard of surgical care given the complexity of the case.  This assistant was needed for exposure, dissection, suction, tissue approximation, retraction, perception, etc.  ANESTHESIA:     General  [place]ional TRANSVERSUS ABDOMINIS PLANE (TAP) nerve block for perioperative & postoperative pain control provided with liposomal bupivacaine (Experel) mixed with 0.25% bupivacaine as a Bilateral TAP block x 30mL each side at the level of the transverse abdominis & preperitoneal spaces along the flank at the anterior axillary line, from subcostal ridge to iliac crest under laparoscopic guidance   Local field block at port sites & extraction wound  EBL:  Total I/O In: 1600 [I.V.:1500; IV Piggyback:100] Out: 100 [Blood:100]  Delay start of Pharmacological VTE agent (>24hrs) due to surgical blood loss or risk of bleeding:  no  DRAINS: No  SPECIMEN:   RECTOSIGMOID COLON (open end  proximal) DISTAL ANASTOMOTIC RING (final distal margin)  DISPOSITION OF SPECIMEN:  PATHOLOGY  COUNTS:  YES  PLAN OF CARE: Admit to inpatient   PATIENT DISPOSITION:  PACU - hemodynamically stable.  INDICATION:    Pleasant woman with recurrent episodes of diverticulitis with more complex attack suspicious for abscess.  Cooldown on antibiotics.  Prior colonoscopy ruled out malignancy.  I recommended segmental colonic resection:  The anatomy & physiology of the digestive tract was discussed.  The pathophysiology was discussed.  Natural history risks without surgery was discussed.   I worked to give an overview of the disease and the frequent need to have multispecialty involvement.  I feel the risks of no intervention will lead to serious problems that outweigh the operative risks; therefore, I recommended a partial colectomy to remove the pathology.  Laparoscopic & open techniques were discussed.   Risks such as bleeding, infection, abscess, leak, reoperation, possible ostomy, hernia, heart attack, death, and other risks were discussed.  I noted a good likelihood this will help address the problem.   Goals of post-operative recovery were discussed as well.  We will work to minimize complications.  Educational materials on the pathology had been given in the office.  Questions were answered.    The patient expressed understanding & wished to proceed with surgery.  OR FINDINGS:   Patient had inflamed sigmoid phlegmon with dense adhesions to left adnexa round ligament and dome of the uterus with contained abscess within.  Anatomy rather obliterated.  En bloc left salpingectomy needed to be done.  No obvious metastatic disease on visceral parietal peritoneum or liver.  The anastomosis rests 9 cm from the anal verge by rigid proctoscopy.  CASE DATA:  Type of patient?: Elective [facility] Private Case  Status of Case? Elective Scheduled  Infection Present At Time Of Surgery (PATOS)?  ABSCESS &  phlegmon  DESCRIPTION:   Informed consent was confirmed.  The patient underwent general anaesthesia without difficulty.  The patient was positioned appropriately.  VTE prevention in place.  Patient underwent cystoscopy with firefly injection and ureteral stent placement by Dr. Sebastian Ache with [facility].  Please see his separate operative report.  The patient was clipped, prepped, & draped in a sterile fashion.  Surgical timeout confirmed our plan.  The patient was positioned in reverse Trendelenburg.  Abdominal entry was gained using Varess technique at the left subcostal ridge on the anterior abdominal wall.  No elevated EtCO2 noted.  Port placed.  Camera inspection revealed no injury.  Extra ports were carefully placed under direct laparoscopic visualization.  I did do laparoscopic lysed lesions to free greater omentum off adhesions on the upper abdomen and periumbilical region.  No hernia found.  Upon entering the abdomen (organ space), I encountered a phlegmon involving the sigmoid colon .   I reflected the greater omentum and the upper abdomen the small bowel in the upper abdomen.  The patient was carefully positioned.  The Intuitive daVinci robot was docked with camera & instruments carefully placed.  The patient had significant central mesenteric obesity with foreshortened small bowel.  Visualization was challenging so extra port placed for the assist on the right upper flank.   I worked to mobilize the rectosigmoid colon in a lateral medial fashion to get off some adhesions to the anterior left lower quadrant and pelvis.  Concrete adhesions with the left adnexa rather obliterated.  I under alternating between lateral to medial and medial to lateral dissection.  I mobilized the rectosigmoid colon & elevated it to put the main pedicle on tension.  I scored the base of peritoneum of the medial side of the mesentery of the elevated left colon from the ligament of Treitz to the mid rectum.   I  elevated the sigmoid mesentery and entered into the retro-mesenteric plane. We were able to identify the left ureter and gonadal vessels. We kept those posterior within the retroperitoneum and elevated the left colon mesentery off that. I did isolate the inferior mesenteric artery (IMA) pedicle but did not ligate it yet.  I continued distally and got into the avascular plane posterior to the mesorectum, sparing the nervi ergentes.. This allowed me to help mobilize the rectum as well by freeing the mesorectum off the sacrum.  I stayed away from the right and left ureters.    I skeletonized the lymph nodes off the inferior mesenteric artery pedicle.  I went down to its takeoff from the aorta.   I isolated the inferior mesenteric vein off of the ligament of Treitz just cephalad to that as well.  After confirming the left ureter was out of the way, I went ahead and ligated the inferior mesenteric artery pedicle just near its takeoff from the aorta.  I did ligate the inferior mesenteric vein in a similar fashion.  We ensured hemostasis.  I continued medial to lateral dissection to free the left colon mesentery off the retroperitoneum going up towards the splenic flexure to allow good mobility and protect the colon mesentery.  I mobilized the left colon in a lateral to medial fashion off the retroperitoneum and sidewall attachments along the line of Toldt up towards the splenic flexure to ensure good mobilization of the remaining left colon to reach into the pelvis.   We then focused on mesorectal dissection.  Patient had very dense adhesions to the left  adnexa and left retroperitoneum.  I took care to elevate the rectosigmoid off the retroperitoneum along the course of the left ureter which I could see and protect in the retroperitoneal position.  The left adnexa was completely obliterated and I ended up having to come through the left fallopian tube and left round ligament to help mobilize the contorted rectosigmoid  colon off the left anterior pelvis.  Encountered an abscess pocket that we aspirated controlled.  Had to get into the dome of the left uterus somewhat to help free this entire abscess cavity off.  Patient had a rather enlarged uterus but no obvious fibroids.  Eventually from coming from the right anterior side and the left retroperitoneal side could eventually freed everything off.  Came through the rectosigmoid mesentery and the left side to help untwisted and straighten it out for better visualization.  Freed the mesorectum off the presacral plane until I was distal to the concerning region.  Freed off peritoneum on the lateral sidewalls as well and transected the mesentery of the lateral pedicles to get distal to the area of concern.  Came around anteriorly such that I had good circumferential mesorectal excision and a good margin distal to the area of concern.  I chose a region at the descending/sigmoid junction that was soft and easily reached down to the rectal stump.  I transected the mesentery of the colon radially to preserve remaining colon blood supply.  I skeletonized the mesorectum and transected at the proximal rectum using a robotic stapler.  We then chose a region for the proximal margin that would reach well for our planned anastomosis.  Transected the colon mesentery radially to preserve good collateral and marginal artery blood supply.  We created an extraction incision through a small Pfannenstiel incision in the suprapubic region.  Placed a wound protector.  I was able to eviscerate the rectosigmoid and descending colon out the wound.   I clamped the colon proximal to this area using a reusable pursestringer device.  Passed a 2-0 Keith needle. I transected at the descending/sigmoid junction with a scalpel. I got healthy bleeding mucosa.  We sent the rectosigmoid colon specimen off to go to pathology.  We sized the colon orifice.  I chose a 29mm EEA anvil stapler system.  I reinforced the  prolene pursestring with interrupted silk "belt loop" sutures.  I placed the anvil to the open end of the proximal remaining colon and closed around it using the pursestring.    We did copious irrigation with crystalloid solution.  Hemostasis was good.  The distal end of the remaining colon easily reached down to the rectal stump, therefore, splenic flexure mobilization was not needed.      Dr Cliffton Asters scrubbed down and did gentle anal dilation and advanced the EEA stapler up the rectal stump. The spike was brought out at the provimal end of the rectal stump under direct visualization.  I  attached the anvil of the proximal colon the spike of the stapler. Anvil was tightened down and held clamped for 60 seconds.  Orientation was confirmed such that there is no twisting of the colon nor small bowel underneath the mesenteric defect. No concerning tension.  The EEA stapler was fired and held clamped for 30 seconds. The stapler was released & removed. Blue stitch is in the proximal ring.  Care was taken to ensure no other structures were incorporated within this either.  We noted 2 excellent anastomotic rings.   The colon proximal to the  anastomosis was then gently occluded. The pelvis was filled with sterile irrigation.  Dr Cliffton Asters  did rigid proctoscopy noted the anastomosis was at 9 cm from the anal verge consistent with the proximal rectum.  There was a negative air leak test. There was no tension of mesentery or bowel at the anastomosis.   Tissues looked viable.  Ureters & bowel uninjured.  Ureteral stents were removed Foley catheter left in place.  The anastomosis looked healthy. Greater omentum positioned down into the pelvis to help protect the anastomosis.  Endoluminal gas was evacuated.  Ports & wound protector removed.  We changed gloves & redraped the patient per colon SSI prevention protocol.  We aspirated the antibiotic irrigation.  Hemostasis was good.  Sterile unused instruments were used from this  point.  I closed the skin at the port sites using Monocryl stitch and sterile dressing.  I closed the extraction wound using a 0 Vicryl vertical peritoneal closure and a #1 PDS transverse anterior rectal fascial closure like a small Pfannenstiel closure. I closed the skin with some interrupted Monocryl stitches. I placed sterile dressings.     Patient is being extubated go to recovery room. I had discussed postop care with the patient in detail the office & in the holding area. Instructions are written. I discussed operative findings, updated the patient's status, discussed probable steps to recovery, and gave postoperative recommendations to the patient's spouse.  Recommendations were made.  Questions were answered.  He expressed understanding & appreciation.  Ardeth Sportsman, M.D., F.A.C.S. Gastrointestinal and Minimally Invasive Surgery [facility] [address], [address] [place], [place] [address] [phone] Main / Paging

## 2021-08-24 NOTE — Consult Note (Signed)
[facility] Nurse requested for preoperative stoma site marking ?Patient marked in ostomy clinic for surgery.  [place] team will follow up post-op for ostomy needs. ? ?Val Riles, RN, MSN, CWOCN, CNS-BC, pager [phone]  ?

## 2021-08-24 NOTE — Brief Op Note (Signed)
08/24/2021 ? ?1:05 PM ? ?PATIENT:  Frances Gray  71 y.o. female ? ?PRE-OPERATIVE DIAGNOSIS:  diverticulitis ? ?POST-OPERATIVE DIAGNOSIS:  sigmoid diverticulitis with colouterine abscess ? ?PROCEDURE:   Cystoscoy with bilateral retrogrades and bilateral ureteral stent placement ? ? ?SURGEON:   ? ?   Alexis Frock, MD - Primary ? ?PHYSICIAN ASSISTANT:  ? ?ASSISTANTS: none  ? ?ANESTHESIA:   general ? ?EBL:  minimal ? ?BLOOD ADMINISTERED:none ? ?DRAINS:  foley and externalizd stents to gravity   ? ?LOCAL MEDICATIONS USED:  NONE ? ?SPECIMEN:  No Specimen ? ?DISPOSITION OF SPECIMEN:  N/A ? ?COUNTS:  YES ? ?TOURNIQUET:  * No tourniquets in log * ? ?DICTATION: .Other Dictation: Dictation Number [id number] ? ?PLAN OF CARE:  remain in OR ? ?PATIENT DISPOSITION:   as per above ?  ?Delay start of Pharmacological VTE agent (>24hrs) due to surgical blood loss or risk of bleeding: yes ? ?

## 2021-08-24 NOTE — Anesthesia Procedure Notes (Signed)
Procedure Name: Intubation ?Date/Time: 08/24/2021 7:38 AM ?Performed by: [place] Nataliyah Packham C, CRNA ?Pre-anesthesia Checklist: Patient identified, Emergency Drugs available, Suction available and Patient being monitored ?Patient Re-evaluated:Patient Re-evaluated prior to induction ?Oxygen Delivery Method: Circle system utilized ?Preoxygenation: Pre-oxygenation with 100% oxygen ?Induction Type: IV induction ?Ventilation: Mask ventilation without difficulty ?Laryngoscope Size: Mac, 3 and Glidescope ?Grade View: Grade II ?Tube type: Oral ?Tube size: 7.5 mm ?Number of attempts: 1 ?Airway Equipment and Method: Stylet and Rigid stylet ?Placement Confirmation: ETT inserted through vocal cords under direct vision, positive ETCO2 and breath sounds checked- equal and bilateral ?Secured at: 21 cm ?Tube secured with: Tape ?Dental Injury: Teeth and Oropharynx as per pre-operative assessment  ?Comments: DL x 1 with MAC 3, grade 3 view- ETT placed into esophagus; DLx 1 with MAC 3 glidescope- 7.5 ETT placed atraumatic and placement verified ? ? ? ? ?

## 2021-08-24 NOTE — Discharge Instructions (Signed)
SURGERY: POST OP INSTRUCTIONS ?(Surgery for small bowel obstruction, colon resection, etc) ? ? ?###################################################################### ? ?EAT ?Gradually transition to a high fiber diet with a fiber supplement over the next few days after discharge ? ?WALK ?Walk an hour a day.  Control your pain to do that.   ? ?CONTROL PAIN ?Control pain so that you can walk, sleep, tolerate sneezing/coughing, go up/down stairs. ? ?HAVE A BOWEL MOVEMENT DAILY ?Keep your bowels regular to avoid problems.  OK to try a laxative to override constipation.  OK to use an antidairrheal to slow down diarrhea.  Call if not better after 2 tries ? ?CALL IF YOU HAVE PROBLEMS/CONCERNS ?Call if you are still struggling despite following these instructions. ?Call if you have concerns not answered by these instructions ? ?###################################################################### ? ? ?DIET ?Follow a light diet the first few days at home.  Start with a bland diet such as soups, liquids, starchy foods, low fat foods, etc.  If you feel full, bloated, or constipated, stay on a ful liquid or pureed/blenderized diet for a few days until you feel better and no longer constipated. ?Be sure to drink plenty of fluids every day to avoid getting dehydrated (feeling dizzy, not urinating, etc.). ?Gradually add a fiber supplement to your diet over the next week.  Gradually get back to a regular solid diet.  Avoid fast food or heavy meals the first week as you are more likely to get nauseated. ?It is expected for your digestive tract to need a few months to get back to normal.  It is common for your bowel movements and stools to be irregular.  You will have occasional bloating and cramping that should eventually fade away.  Until you are eating solid food normally, off all pain medications, and back to regular activities; your bowels will not be normal. ?Focus on eating a low-fat, high fiber diet the rest of your life  (See Getting to [place], below). ? ?CARE of your INCISION or WOUND ?It is good for closed incision and even open wounds to be washed every day.  Shower every day.  Short baths are fine.  Wash the incisions and wounds clean with soap & water.    ? ?If you have a closed incision(s), wash the incision with soap & water every day.  You may leave closed incisions open to air if it is dry.   You may cover the incision with clean gauze & replace it after your daily shower for comfort. ? ?It is good for closed incisions and even open wounds to be washed every day.  Shower every day.  Short baths are fine.  Wash the incisions and wounds clean with soap & water.    ?You may leave closed incisions open to air if it is dry.   You may cover the incision with clean gauze & replace it after your daily shower for comfort. ? ?TEGADERM:  You have clear gauze band-aid dressings over your closed incision(s).  Remove the dressings 3 days after surgery.= 3/20 ? ? ?If you have an open wound with a wound vac, see wound vac care instructions. ? ? ? ? ?ACTIVITIES as tolerated ?Start light daily activities --- self-care, walking, climbing stairs-- beginning the day after surgery.  Gradually increase activities as tolerated.  Control your pain to be active.  Stop when you are tired.  Ideally, walk several times a day, eventually an hour a day.   ?Most people are back to most day-to-day activities  in a few weeks.  It takes 4-8 weeks to get back to unrestricted, intense activity. ?If you can walk 30 minutes without difficulty, it is safe to try more intense activity such as jogging, treadmill, bicycling, low-impact aerobics, swimming, etc. ?Save the most intensive and strenuous activity for last (Usually 4-8 weeks after surgery) such as sit-ups, heavy lifting, contact sports, etc.  Refrain from any intense heavy lifting or straining until you are off narcotics for pain control.  You will have off days, but things should improve  week-by-week. ?DO NOT PUSH THROUGH PAIN.  Let pain be your guide: If it hurts to do something, don't do it.  Pain is your body warning you to avoid that activity for another week until the pain goes down. ?You may drive when you are no longer taking narcotic prescription pain medication, you can comfortably wear a seatbelt, and you can safely make sudden turns/stops to protect yourself without hesitating due to pain. ?You may have sexual intercourse when it is comfortable. If it hurts to do something, stop. ? ?MEDICATIONS ?Take your usually prescribed home medications unless otherwise directed.   ?Blood thinners:  ?Usually you can restart any strong blood thinners after the second postoperative day.  It is OK to take aspirin right away.    ? If you are on strong blood thinners (warfarin/Coumadin, Plavix, Xerelto, Eliquis, Pradaxa, etc), discuss with your surgeon, medicine PCP, and/or cardiologist for instructions on when to restart the blood thinner & if blood monitoring is needed (PT/INR blood check, etc).   ? ? ?PAIN CONTROL ?Pain after surgery or related to activity is often due to strain/injury to muscle, tendon, nerves and/or incisions.  This pain is usually short-term and will improve in a few months.  ?To help speed the process of healing and to get back to regular activity more quickly, DO THE FOLLOWING THINGS TOGETHER: ?Increase activity gradually.  DO NOT PUSH THROUGH PAIN ?Use Ice and/or Heat ?Try Gentle Massage and/or Stretching ?Take over the counter pain medication ?Take Narcotic prescription pain medication for more severe pain ? ?Good pain control = faster recovery.  It is better to take more medicine to be more active than to stay in bed all day to avoid medications. ? Increase activity gradually ?Avoid heavy lifting at first, then increase to lifting as tolerated over the next 6 weeks. ?Do not ?push through? the pain.  Listen to your body and avoid positions and maneuvers than reproduce the pain.   Wait a few days before trying something more intense ?Walking an hour a day is encouraged to help your body recover faster and more safely.  Start slowly and stop when getting sore.  If you can walk 30 minutes without stopping or pain, you can try more intense activity (running, jogging, aerobics, cycling, swimming, treadmill, sex, sports, weightlifting, etc.) ?Remember: If it hurts to do it, then don?t do it! ?Use Ice and/or Heat ?You will have swelling and bruising around the incisions.  This will take several weeks to resolve. ?Ice packs or heating pads (6-8 times a day, 30-60 minutes at a time) will help sooth soreness & bruising. ?Some people prefer to use ice alone, heat alone, or alternate between ice & heat.  Experiment and see what works best for you.  Consider trying ice for the first few days to help decrease swelling and bruising; then, switch to heat to help relax sore spots and speed recovery. ?Shower every day.  Short baths are fine.  It feels  good!  Keep the incisions and wounds clean with soap & water.   ?Try Gentle Massage and/or Stretching ?Massage at the area of pain many times a day ?Stop if you feel pain - do not overdo it ?Take over the counter pain medication ?This helps the muscle and nerve tissues become less irritable and calm down faster ?Choose ONE of the following over-the-counter anti-inflammatory medications: ?Acetaminophen '500mg'$  tabs (Tylenol) 1-2 pills with every meal and just before bedtime (avoid if you have liver problems or if you have acetaminophen in you narcotic prescription) ?Naproxen '220mg'$  tabs (ex. Aleve, Naprosyn) 1-2 pills twice a day (avoid if you have kidney, stomach, IBD, or bleeding problems) ?Ibuprofen '200mg'$  tabs (ex. Advil, Motrin) 3-4 pills with every meal and just before bedtime (avoid if you have kidney, stomach, IBD, or bleeding problems) ?Take with food/snack several times a day as directed for at least 2 weeks to help keep pain / soreness down & more  manageable. ?Take Narcotic prescription pain medication for more severe pain ?A prescription for strong pain control is often given to you upon discharge (for example: oxycodone/Percocet, hydrocodone/Norco/Vicodin, or

## 2021-08-24 NOTE — Transfer of Care (Signed)
Immediate Anesthesia Transfer of Care Note ? ?Patient: Frances Gray ? ?Procedure(s) Performed: ROBOTIC LOW ANTERIOR RECTOSIGMOID RESECTION, DRAINAGE OF PELVIC ABSCESS, INTRAOPERATIVE ASSESSMENT OF PERFUSION USING FIREFLY, AND TAP BLOCK ?POSSIBLE OSTOMY ?RIGID PROCTOSCOPY ?CYSTOSCOPY with FIREFLY INJECTION, RETROGRADE PYELOGRAM AND BILATERAL STENT PLACEMENT (Bilateral) ? ?Patient Location: PACU ? ?Anesthesia Type:General ? ?Level of Consciousness: awake, alert  and oriented ? ?Airway & Oxygen Therapy: Patient Spontanous Breathing and Patient connected to face mask oxygen ? ?Post-op Assessment: Report given to RN and Post -op Vital signs reviewed and stable ? ?Post vital signs: Reviewed and stable ? ?Last Vitals:  ?Vitals Value Taken Time  ?BP 191/102 08/24/21 1131  ?Temp    ?Pulse 71 08/24/21 1134  ?Resp 17 08/24/21 1134  ?SpO2 100 % 08/24/21 1134  ?Vitals shown include unvalidated device data. ? ?Last Pain:  ?Vitals:  ? 08/24/21 0556  ?TempSrc: Oral  ?   ? ?  ? ?Complications: No notable events documented. ?

## 2021-08-24 NOTE — H&P (Signed)
Frances Gray is an 71 y.o. female.   ? ?Chief Complaint: Pre-OP Cysto, BIlateral Retrogrades / Stents ? ?HPI:  ? ?1 - Recurrent Diverticulitis / Request for Peri-op Ureteral Marking + Stents - h/o recurrent severe diverticulitis undergoing resection under care of general surgery team reqeusts ureteral marking and stents for ureteral protection. CT 06/2021 with single ureters bilaterally and area of abscess deep left pelvis. She is morbidly obese.  ? ?Today "Frances Gray" is seen for above.  ? ?Past Medical History:  ?Diagnosis Date  ? Allergic rhinitis, seasonal   ? takes Claritin daily as needed and uses Flonase if needed  ? Allergy   ? Anemia   ? Arthritis   ? 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 Diverticulitis   ? GERD (gastroesophageal reflux disease)   ? doesn't take any meds  ? Heart murmur   ? no symptoms  ? History of bronchitis winter of 2014  ? History of colon polyps   ? benign  ? History of kidney stones   ? Hyperlipidemia   ? Hypertension   ? takes Hyzaar daily  ? Pneumonia   ? Pre-diabetes   ? Vitamin D deficiency disease 08/14/2011  ? takes Vit D daily  ? ? ?Past Surgical History:  ?Procedure Laterality Date  ? BREAST BIOPSY Left   ? Breast ultrasound Right 03/02/13  ? The 27m complex cyst in the right breast is probably benign. Follow-up in 6 months with bilateral mammogram and (R) ultrasound  ? Breast Ultrasound Right 09/02/13  ? There is a benign 3 mm complex cyst in the (R) breast at 9 o'clock  ? COLONOSCOPY    ? ESOPHAGUS SURGERY  1996  ? tumor removed   (benign)  ? KNEE ARTHROSCOPY    ? bilateral  ? LITHOTRIPSY  08/2011  ? R ureteral stone  ? POLYPECTOMY    ? TOTAL KNEE ARTHROPLASTY Left 11/16/2012  ? Procedure: TOTAL KNEE ARTHROPLASTY- left;  Surgeon: JJohnny Bridge MD;  [facility]  Service: Orthopedics;  Laterality: Left;  ? TOTAL KNEE ARTHROPLASTY Right 12/06/2014  ? Procedure: TOTAL KNEE ARTHROPLASTY;  Surgeon: JMarchia Bond MD;  [facility]  Service: Orthopedics;  Laterality:  Right;  ? ? ?Family History  ?Problem Relation Age of Onset  ? Ovarian cancer Mother   ? Colon cancer Sister 51 ? Breast cancer Sister   ? Esophageal cancer Neg Hx   ? Colon polyps Neg Hx   ? Rectal cancer Neg Hx   ? Stomach cancer Neg Hx   ? ?Social History:  reports that she has quit smoking. Her smoking use included cigarettes. She has a 2.50 pack-year smoking history. She has never used smokeless tobacco. She reports that she does not drink alcohol and does not use drugs. ? ?Allergies:  ?Allergies  ?Allergen Reactions  ? Amlodipine Cough  ? Metformin And Related Diarrhea  ? Pravastatin   ?  Joint ache  ? ? ?Medications Prior to Admission  ?Medication Sig Dispense Refill  ? Biotin 10 MG TABS Take 10 mg by mouth daily.    ? calcium carbonate (CALCIUM 600) 600 MG TABS tablet Take 1 tablet (600 mg total) by mouth 2 (two) times daily with a meal. 60 tablet 6  ? cetirizine (ZYRTEC) 10 MG tablet Take 10 mg by mouth daily as needed for allergies.    ? Cholecalciferol (VITAMIN D3) 2000 UNITS TABS Take 2,000 Units by mouth every morning.     ?  finasteride (PROSCAR) 5 MG tablet Take 5 mg by mouth daily.    ? Fluocinolone Acetonide Scalp 0.01 % OIL Apply 1 mL topically every other day.    ? fluticasone (FLONASE) 50 MCG/ACT nasal spray Place 1 spray into both nostrils daily as needed for allergies or rhinitis.   0  ? losartan-hydrochlorothiazide (HYZAAR) 100-25 MG tablet TAKE 1 TABLET BY MOUTH EVERY DAY 90 tablet 1  ? Multiple Vitamin (MULTIVITAMIN) tablet Take 1 tablet by mouth every morning.     ? Multiple Vitamins-Minerals (ZINC PO) Take 1 tablet by mouth daily.    ? rosuvastatin (CRESTOR) 5 MG tablet TAKE 1 TABLET BY MOUTH TWICE WEEKLY 24 tablet 1  ? sennosides-docusate sodium (SENOKOT-S) 8.6-50 MG tablet Take 1 tablet by mouth at bedtime. (Patient taking differently: Take 2 tablets by mouth at bedtime.)    ? ? ?Results for orders placed or performed during the hospital encounter of 08/24/21 (from the past 48 hour(s))   ?Glucose, capillary     Status: Abnormal  ? Collection Time: 08/24/21  5:44 AM  ?Result Value Ref Range  ? Glucose-Capillary 206 (H) 70 - 99 mg/dL  ?  Comment: Glucose reference range applies only to samples taken after fasting for at least 8 hours.  ? Comment 1 Notify RN   ? Comment 2 Document in Chart   ?Type and screen [place]     Status: None (Preliminary result)  ? Collection Time: 08/24/21  5:50 AM  ?Result Value Ref Range  ? ABO/RH(D) O POS   ? Antibody Screen NEG   ? Sample Expiration    ?  08/27/2021,2359 ?Performed at [facility], [place] [address], [place], [place] [address] ?  ? ?No results found. ? ?Review of Systems  ?Constitutional:  Negative for chills and fever.  ?All other systems reviewed and are negative. ? ?Blood pressure (!) 158/85, pulse 65, temperature 97.8 ?F (36.6 ?C), temperature source Oral, resp. rate 18, SpO2 98 %. ?Physical Exam ?Vitals reviewed.  ?Pulmonary:  ?   Effort: Pulmonary effort is normal.  ?Abdominal:  ?   Comments: Large truncal obesity limits sensitivity of exam.   ?Genitourinary: ?   Comments: NO CVAT ?Musculoskeletal:     ?   General: Normal range of motion.  ?Skin: ?   General: Skin is warm.  ?Neurological:  ?   General: No focal deficit present.  ?   Mental Status: She is alert.  ?Psychiatric:     ?   Mood and Affect: Mood normal.  ?  ? ?Assessment/Plan ? ?Proceed as planned with cysto, bilateral retrogrades, bilateral ICG injection and stents for ureteral marking. Risks,  benefits, peri-op course discussed.  ? ?Alexis Frock, MD ?08/24/2021, 7:06 AM ? ? ? ?

## 2021-08-24 NOTE — Anesthesia Postprocedure Evaluation (Signed)
Anesthesia Post Note ? ?Patient: Frances Gray ? ?Procedure(s) Performed: ROBOTIC LOW ANTERIOR RECTOSIGMOID RESECTION, DRAINAGE OF PELVIC ABSCESS, INTRAOPERATIVE ASSESSMENT OF PERFUSION USING FIREFLY, AND TAP BLOCK ?RIGID PROCTOSCOPY ?CYSTOSCOPY with FIREFLY INJECTION, RETROGRADE PYELOGRAM AND BILATERAL STENT PLACEMENT (Bilateral) ? ?  ? ?Patient location during evaluation: PACU ?Anesthesia Type: General ?Level of consciousness: awake ?Pain management: pain level controlled ?Vital Signs Assessment: post-procedure vital signs reviewed and stable ?Respiratory status: spontaneous breathing, nonlabored ventilation, respiratory function stable and patient connected to nasal cannula oxygen ?Cardiovascular status: blood pressure returned to baseline and stable ?Postop Assessment: no apparent nausea or vomiting ?Anesthetic complications: no ? ? ?No notable events documented. ? ?Last Vitals:  ?Vitals:  ? 08/24/21 1406 08/24/21 1511  ?BP: (!) 172/87 (!) 157/92  ?Pulse: 64 68  ?Resp: 14   ?Temp:  36.5 ?C  ?SpO2: 100% 100%  ?  ?Last Pain:  ?Vitals:  ? 08/24/21 1402  ?TempSrc: Oral  ?PainSc:   ? ? ?  ?  ?  ?  ?  ?  ? ?[name] P Zev Blue ? ? ? ? ?

## 2021-08-25 ENCOUNTER — Encounter (HOSPITAL_COMMUNITY): Payer: Self-pay | Admitting: Surgery

## 2021-08-25 LAB — MAGNESIUM: Magnesium: 1.7 mg/dL (ref 1.7–2.4)

## 2021-08-25 LAB — CBC
HCT: 36.2 % (ref 36.0–46.0)
Hemoglobin: 11.9 g/dL — ABNORMAL LOW (ref 12.0–15.0)
MCH: 27.9 pg (ref 26.0–34.0)
MCHC: 32.9 g/dL (ref 30.0–36.0)
MCV: 84.8 fL (ref 80.0–100.0)
Platelets: 345 10*3/uL (ref 150–400)
RBC: 4.27 MIL/uL (ref 3.87–5.11)
RDW: 14 % (ref 11.5–15.5)
WBC: 11.7 10*3/uL — ABNORMAL HIGH (ref 4.0–10.5)
nRBC: 0 % (ref 0.0–0.2)

## 2021-08-25 LAB — BASIC METABOLIC PANEL
Anion gap: 10 (ref 5–15)
BUN: 9 mg/dL (ref 8–23)
CO2: 26 mmol/L (ref 22–32)
Calcium: 8.9 mg/dL (ref 8.9–10.3)
Chloride: 96 mmol/L — ABNORMAL LOW (ref 98–111)
Creatinine, Ser: 0.84 mg/dL (ref 0.44–1.00)
GFR, Estimated: >60 mL/min (ref >60)
Glucose, Bld: 153 mg/dL — ABNORMAL HIGH (ref 70–99)
Potassium: 3.2 mmol/L — ABNORMAL LOW (ref 3.5–5.1)
Sodium: 132 mmol/L — ABNORMAL LOW (ref 135–145)

## 2021-08-25 LAB — GLUCOSE, CAPILLARY
Glucose-Capillary: 152 mg/dL — ABNORMAL HIGH (ref 70–99)
Glucose-Capillary: 163 mg/dL — ABNORMAL HIGH (ref 70–99)
Glucose-Capillary: 159 mg/dL — ABNORMAL HIGH (ref 70–99)
Glucose-Capillary: 152 mg/dL — ABNORMAL HIGH (ref 70–99)

## 2021-08-25 NOTE — Progress Notes (Signed)
? ? ?  Assessment & Plan: ?POD#1 - ROBOTIC LOW ANTERIOR RECTOSIGMOID RESECTION - Dr. Johney Maine ?-LEFT SALPINGO-OOPHORECTOMY ?-DRAINAGE OF COLO-UTERINE PELVIC ABSCESS ?Advancing diet per protocol ?Encouraged OOB, ambulation ?Pain controlled ? ?      Armandina Gemma, MD ?      [facility] ?      Office: [phone] ? ? ?Chief Complaint: ?Diverticular disease ? ?Subjective: ?Patient in bed, some pain.  No flatus or BM.  Wants to ambulate. ? ?Objective: ?Vital signs in last 24 hours: ?Temp:  [97.6 ?F (36.4 ?C)-98.3 ?F (36.8 ?C)] 97.8 ?F (36.6 ?C) (03/18 0755) ?Pulse Rate:  [60-72] 66 (03/18 0755) ?Resp:  [14-20] 18 (03/18 0755) ?BP: (117-180)/(63-94) 123/63 (03/18 0755) ?SpO2:  [97 %-100 %] 100 % (03/18 0755) ?  ? ?Intake/Output from previous day: ?03/17 0701 - 03/18 0700 ?In: 2832.5 [P.O.:720; I.V.:1912.5; IV Piggyback:200] ?Out: 2900 [Urine:2800; Blood:100] ?Intake/Output this shift: ?No intake/output data recorded. ? ?Physical Exam: ?HEENT - sclerae clear, mucous membranes moist ?Neck - soft ?Abdomen - soft, wounds dry and intact; BS present ?Ext - no edema, non-tender ?Neuro - alert & oriented, no focal deficits ? ?Lab Results:  ?Recent Labs  ?  08/25/21 ?9038  ?WBC 11.7*  ?HGB 11.9*  ?HCT 36.2  ?PLT 345  ? ?BMET ?Recent Labs  ?  08/25/21 ?0359  ?NA 132*  ?K 3.2*  ?CL 96*  ?CO2 26  ?GLUCOSE 153*  ?BUN 9  ?CREATININE 0.84  ?CALCIUM 8.9  ? ?PT/INR ?No results for input(s): LABPROT, INR in the last 72 hours. ?Comprehensive Metabolic Panel: ?   ?Component Value Date/Time  ? NA 132 (L) 08/25/2021 0359  ? NA 136 08/09/2021 1117  ? K 3.2 (L) 08/25/2021 0359  ? K 4.0 08/09/2021 1117  ? CL 96 (L) 08/25/2021 0359  ? CL 101 08/09/2021 1117  ? CO2 26 08/25/2021 0359  ? CO2 29 08/09/2021 1117  ? BUN 9 08/25/2021 0359  ? BUN 11 08/09/2021 1117  ? CREATININE 0.84 08/25/2021 0359  ? CREATININE 0.65 08/09/2021 1117  ? GLUCOSE 153 (H) 08/25/2021 0359  ? GLUCOSE 114 (H) 08/09/2021 1117  ? CALCIUM 8.9 08/25/2021 0359  ?  CALCIUM 9.4 08/09/2021 1117  ? CALCIUM 9.8 11/28/2011 1244  ? AST 19 06/11/2021 0350  ? AST 21 05/23/2021 1035  ? ALT 19 06/11/2021 0350  ? ALT 19 05/23/2021 1035  ? ALKPHOS 65 06/11/2021 0350  ? ALKPHOS 61 05/23/2021 1035  ? BILITOT 0.6 06/11/2021 0350  ? BILITOT 0.4 05/23/2021 1035  ? PROT 8.4 (H) 06/11/2021 0350  ? PROT 8.0 05/23/2021 1035  ? ALBUMIN 3.6 06/11/2021 0350  ? ALBUMIN 3.7 05/23/2021 1035  ? ? ?Studies/Results: ?DG C-Arm 1-60 Min-No Report ? ?Result Date: 08/24/2021 ?Fluoroscopy was utilized by the requesting physician.  No radiographic interpretation.   ? ? ? ?Armandina Gemma ?08/25/2021 ? ? Patient ID: Frances Gray, female   DOB: 10-25-1950, 71 y.o.   MRN: [id number] ? ?

## 2021-08-26 LAB — GLUCOSE, CAPILLARY
Glucose-Capillary: 144 mg/dL — ABNORMAL HIGH (ref 70–99)
Glucose-Capillary: 111 mg/dL — ABNORMAL HIGH (ref 70–99)
Glucose-Capillary: 119 mg/dL — ABNORMAL HIGH (ref 70–99)
Glucose-Capillary: 107 mg/dL — ABNORMAL HIGH (ref 70–99)

## 2021-08-26 NOTE — Progress Notes (Signed)
2 Days Post-Op  ? ?Subjective/Chief Complaint: ?Complains of nausea and not feeling well this morning ?Pain controlled ?No flatus or BM yet ? ? ?Objective: ?Vital signs in last 24 hours: ?Temp:  [97.4 ?F (36.3 ?C)-98.5 ?F (36.9 ?C)] 98.5 ?F (36.9 ?C) (03/19 0556) ?Pulse Rate:  [70-84] 84 (03/19 0556) ?Resp:  [16-20] 16 (03/19 0556) ?BP: (112-145)/(66-72) 112/66 (03/19 0556) ?SpO2:  [96 %-100 %] 96 % (03/19 0556) ?Last BM Date : 08/25/21 ? ?Intake/Output from previous day: ?03/18 0701 - 03/19 0700 ?In: 960 [P.O.:960] ?Out: 1001 [Urine:1000; Stool:1] ?Intake/Output this shift: ?Total I/O ?In: 120 [P.O.:120] ?Out: 300 [Urine:300] ? ?Exam: ?Awake and alert ?Abdomen soft, non-distended, appropriately tender ?Dressings dry ? ?Lab Results:  ?Recent Labs  ?  08/25/21 ?3710  ?WBC 11.7*  ?HGB 11.9*  ?HCT 36.2  ?PLT 345  ? ?BMET ?Recent Labs  ?  08/25/21 ?0359  ?NA 132*  ?K 3.2*  ?CL 96*  ?CO2 26  ?GLUCOSE 153*  ?BUN 9  ?CREATININE 0.84  ?CALCIUM 8.9  ? ?PT/INR ?No results for input(s): LABPROT, INR in the last 72 hours. ?ABG ?No results for input(s): PHART, HCO3 in the last 72 hours. ? ?Invalid input(s): PCO2, PO2 ? ?Studies/Results: ?No results found. ? ?Anti-infectives: ?Anti-infectives (From admission, onward)  ? ? Start     Dose/Rate Route Frequency Ordered Stop  ? 08/24/21 1800  cefoTEtan (CEFOTAN) 2 g in sodium chloride 0.9 % 100 mL IVPB       ? 2 g ?200 mL/hr over 30 Minutes Intravenous Every 12 hours 08/24/21 1257 08/24/21 1914  ? 08/24/21 1400  neomycin (MYCIFRADIN) tablet 1,000 mg  Status:  Discontinued       ?See Hyperspace for full Linked Orders Report.  ? 1,000 mg Oral 3 times per day 08/24/21 0528 08/24/21 0533  ? 08/24/21 1400  metroNIDAZOLE (FLAGYL) tablet 1,000 mg  Status:  Discontinued       ?See Hyperspace for full Linked Orders Report.  ? 1,000 mg Oral 3 times per day 08/24/21 0528 08/24/21 0533  ? 08/24/21 0600  cefoTEtan (CEFOTAN) 2 g in sodium chloride 0.9 % 100 mL IVPB       ? 2 g ?200 mL/hr over 30  Minutes Intravenous On call to O.R. 08/24/21 [id number] 08/24/21 0741  ? ?  ? ? ?Assessment/Plan: ?s/p Procedure(s): ?ROBOTIC LOW ANTERIOR RECTOSIGMOID RESECTION, DRAINAGE OF PELVIC ABSCESS, INTRAOPERATIVE ASSESSMENT OF PERFUSION USING FIREFLY, AND TAP BLOCK (N/A) ?RIGID PROCTOSCOPY (N/A) ?CYSTOSCOPY with FIREFLY INJECTION, RETROGRADE PYELOGRAM AND BILATERAL STENT PLACEMENT (Bilateral) ? ?Not ready for discharge today ?Continue ERAS, ambulation ?Waiting for return of bowel function ? ? ?Coralie Keens MD ?08/26/2021 ? ?

## 2021-08-26 NOTE — TOC Initial Note (Signed)
Transition of Care (TOC) - Initial/Assessment Note  ? ? ?Patient Details  ?Name: Frances Gray ?MRN: [id number] ?Date of Birth: 01-05-1951 ? ?Transition of Care (TOC) CM/SW Contact:    ?Tawanna Cooler, RN ?Phone Number: ?08/26/2021, 1:14 PM ? ?Clinical Narrative:                 ? ?Transition of Care (TOC) Screening Note ? ?Transition of Care Department [facility]) has reviewed patient and no TOC needs have been identified at this time. We will continue to monitor patient advancement through interdisciplinary progression rounds. If new patient transition needs arise, please place a TOC consult. ?  ? ?Expected Discharge Plan: Home/Self Care ?Barriers to Discharge: Continued Medical Work up ? ? ?Expected Discharge Plan and Services ?Expected Discharge Plan: Home/Self Care ?  ?  ?  ?Living arrangements for the past 2 months: Single Family Home ?                ?  ? Prior Living Arrangements/Services ?Living arrangements for the past 2 months: [place] ?Lives with:: Spouse ?Patient language and need for interpreter reviewed:: Yes ?Do you feel safe going back to the place where you live?: Yes      ?Need for Family Participation in Patient Care: Yes (Comment) ?Care giver support system in place?: Yes (comment) ?  ?Criminal Activity/Legal Involvement Pertinent to Current Situation/Hospitalization: No - Comment as needed ? ?Activities of Daily Living ?Home Assistive Devices/Equipment: Eyeglasses ?ADL Screening (condition at time of admission) ?Patient's cognitive ability adequate to safely complete daily activities?: Yes ?Is the patient deaf or have difficulty hearing?: No ?Does the patient have difficulty seeing, even when wearing glasses/contacts?: No ?Does the patient have difficulty concentrating, remembering, or making decisions?: No ?Patient able to express need for assistance with ADLs?: Yes ?Does the patient have difficulty dressing or bathing?: No ?Independently performs ADLs?: Yes (appropriate for  developmental age) ?Does the patient have difficulty walking or climbing stairs?: No ?Weakness of Legs: None ?Weakness of Arms/Hands: None ? ? ?  ?Orientation: : Oriented to Self, Oriented to Place, Oriented to  Time, Oriented to Situation ?Alcohol / Substance Use: Not Applicable ?Psych Involvement: No (comment) ? ?Admission diagnosis:  Sigmoid diverticulitis [K57.32] ?Patient Active Problem List  ? Diagnosis Date Noted  ? Pericolonic abscess due to diverticulitis 08/24/2021  ? Rectosigmoid diverticulitis 07/17/2021  ? Rib pain on right side 01/03/2021  ? Sciatica of left side 01/03/2021  ? Hiatal hernia, small 11/21/2020  ? Hyperparathyroidism ([place]) 05/23/2020  ? Lichen planopilaris 61/44/3154  ? Hepatic steatosis 10/08/2019  ? Plantar fasciitis, right 12/05/2016  ? Diabetes mellitus without complication ([place]) 00/86/7619  ? Nodule of flexor tendon sheath 01/23/2016  ? Midline low back pain without sciatica 01/23/2016  ? Knee osteoarthritis 12/06/2014  ? Hyperlipidemia   ? Osteopenia 12/29/2013  ? Severe obesity (BMI >= 40) ([place]) 11/16/2012  ? Multinodular goiter  - stable, s/p bx - no FU needed 11/28/2011  ? Vitamin D deficiency disease 08/14/2011  ? Kidney stone 07/03/2011  ? Hypertension   ? ?PCP:  Binnie Rail, MD ?Pharmacy:   ?[id number] [place] [address]?Phone: [phone]: [phone]? ? ?Readmission Risk Interventions ?No flowsheet data found. ? ? ?

## 2021-08-27 ENCOUNTER — Ambulatory Visit: Payer: Medicare Other | Admitting: Gastroenterology

## 2021-08-27 ENCOUNTER — Other Ambulatory Visit: Payer: Self-pay

## 2021-08-27 DIAGNOSIS — Z20822 Contact with and (suspected) exposure to covid-19: Secondary | ICD-10-CM | POA: Diagnosis not present

## 2021-08-27 LAB — CBC WITH DIFFERENTIAL/PLATELET
Abs Immature Granulocytes: 0.05 10*3/uL (ref 0.00–0.07)
Basophils Absolute: 0 10*3/uL (ref 0.0–0.1)
Basophils Relative: 0 %
Eosinophils Absolute: 0.4 10*3/uL (ref 0.0–0.5)
Eosinophils Relative: 4 %
HCT: 34.1 % — ABNORMAL LOW (ref 36.0–46.0)
Hemoglobin: 11.2 g/dL — ABNORMAL LOW (ref 12.0–15.0)
Immature Granulocytes: 1 %
Lymphocytes Relative: 14 %
Lymphs Abs: 1.5 10*3/uL (ref 0.7–4.0)
MCH: 28.4 pg (ref 26.0–34.0)
MCHC: 32.8 g/dL (ref 30.0–36.0)
MCV: 86.5 fL (ref 80.0–100.0)
Monocytes Absolute: 1.1 10*3/uL — ABNORMAL HIGH (ref 0.1–1.0)
Monocytes Relative: 10 %
Neutro Abs: 7.6 10*3/uL (ref 1.7–7.7)
Neutrophils Relative %: 71 %
Platelets: 307 10*3/uL (ref 150–400)
RBC: 3.94 MIL/uL (ref 3.87–5.11)
RDW: 14.3 % (ref 11.5–15.5)
WBC: 10.7 10*3/uL — ABNORMAL HIGH (ref 4.0–10.5)
nRBC: 0 % (ref 0.0–0.2)

## 2021-08-27 LAB — SURGICAL PATHOLOGY

## 2021-08-27 LAB — PHOSPHORUS: Phosphorus: 3.5 mg/dL (ref 2.5–4.6)

## 2021-08-27 LAB — GLUCOSE, CAPILLARY
Glucose-Capillary: 100 mg/dL — ABNORMAL HIGH (ref 70–99)
Glucose-Capillary: 133 mg/dL — ABNORMAL HIGH (ref 70–99)
Glucose-Capillary: 109 mg/dL — ABNORMAL HIGH (ref 70–99)
Glucose-Capillary: 151 mg/dL — ABNORMAL HIGH (ref 70–99)

## 2021-08-27 LAB — BASIC METABOLIC PANEL
Anion gap: 5 (ref 5–15)
BUN: 11 mg/dL (ref 8–23)
CO2: 30 mmol/L (ref 22–32)
Calcium: 8.7 mg/dL — ABNORMAL LOW (ref 8.9–10.3)
Chloride: 98 mmol/L (ref 98–111)
Creatinine, Ser: 0.72 mg/dL (ref 0.44–1.00)
GFR, Estimated: >60 mL/min (ref >60)
Glucose, Bld: 116 mg/dL — ABNORMAL HIGH (ref 70–99)
Potassium: 3.1 mmol/L — ABNORMAL LOW (ref 3.5–5.1)
Sodium: 133 mmol/L — ABNORMAL LOW (ref 135–145)

## 2021-08-27 LAB — MAGNESIUM: Magnesium: 1.8 mg/dL (ref 1.7–2.4)

## 2021-08-27 MED ORDER — SENNOSIDES-DOCUSATE SODIUM 8.6-50 MG PO TABS
1.0000 | ORAL_TABLET | Freq: Once | ORAL | Status: AC
  Administered 2021-08-28: 1 via ORAL

## 2021-08-27 MED ORDER — MAGNESIUM SULFATE 2 GM/50ML IV SOLN: 2.0000 g | Freq: Once | INTRAVENOUS | Status: DC

## 2021-08-27 MED ORDER — SENNOSIDES-DOCUSATE SODIUM 8.6-50 MG PO TABS
1.0000 | ORAL_TABLET | Freq: Every day | ORAL | Status: DC
  Administered 2021-08-27: 1 via ORAL
  Filled 2021-08-27: qty 1

## 2021-08-27 MED ORDER — POTASSIUM CHLORIDE CRYS ER 20 MEQ PO TBCR
40.0000 meq | EXTENDED_RELEASE_TABLET | Freq: Two times a day (BID) | ORAL | Status: DC
Start: 2021-08-27 — End: 2021-08-28
  Administered 2021-08-27 – 2021-08-28 (×2): 40 meq via ORAL
  Filled 2021-08-27 (×2): qty 2

## 2021-08-27 MED ORDER — GABAPENTIN 100 MG PO CAPS
200.0000 mg | ORAL_CAPSULE | Freq: Three times a day (TID) | ORAL | Status: DC
  Administered 2021-08-27 – 2021-08-28 (×2): 200 mg via ORAL
  Filled 2021-08-27 (×2): qty 2

## 2021-08-27 MED ORDER — POTASSIUM CHLORIDE 10 MEQ/100ML IV SOLN
10.0000 meq | INTRAVENOUS | Status: AC
  Administered 2021-08-27 (×2): 10 meq via INTRAVENOUS
  Filled 2021-08-27 (×3): qty 100

## 2021-08-27 MED ORDER — GLUCERNA SHAKE PO LIQD
237.0000 mL | Freq: Two times a day (BID) | ORAL | Status: DC
  Administered 2021-08-27 – 2021-08-28 (×3): 237 mL via ORAL
  Filled 2021-08-27 (×4): qty 237

## 2021-08-27 MED ORDER — ADULT MULTIVITAMIN W/MINERALS CH
1.0000 | ORAL_TABLET | Freq: Every day | ORAL | Status: DC
  Administered 2021-08-27 – 2021-08-28 (×2): 1 via ORAL
  Filled 2021-08-27 (×2): qty 1

## 2021-08-27 NOTE — Care Management Important Message (Signed)
Important Message ? ?Patient Details IM Letter given to the Patient. ?Name: Frances Gray ?MRN: [id number] ?Date of Birth: June 29, 1950 ? ? ?Medicare Important Message Given:  Yes ? ? ? ? ?Kerin Salen ?08/27/2021, 2:58 PM ?

## 2021-08-27 NOTE — Evaluation (Signed)
Occupational Therapy Evaluation ?Patient Details ?Name: Frances Gray ?MRN: [id number] ?DOB: [birth year] ?Today's Date: 08/27/2021 ? ? ?History of Present Illness Patient is a 71 year old female who was admitted with sigmoid diverticulitis with colo-uterine abscess. patient underwent robotic low anterior rectosigmoid resection, left salpingo-oophorextomy, drainage of colo-uterine pelvic abscess on 08/24/21.  PMH: R TKA, HTN, HLD, L TKA 2014  ? ?Clinical Impression ?  ?Patient is a 71 year old female who was admitted for above. Patient was noted to have abdominal pain increase after recent surgery impacting ability to engage in LB dressing/bathing tasks. Patient was able to demonstrate ability to use AE after education on this date sitting EOB. Patient requesting for OT to follow back up next session with AE to ensure carryover. Patient would continue to benefit from skilled OT services at this time while admitted to address noted deficits in order to improve overall safety and independence in ADLs.    ?   ? ?Recommendations for follow up therapy are one component of a multi-disciplinary discharge planning process, led by the attending physician.  Recommendations may be updated based on patient status, additional functional criteria and insurance authorization.  ? ?Follow Up Recommendations ? No OT follow up  ?  ?Assistance Recommended at Discharge PRN  ?Patient can return home with the following A little help with bathing/dressing/bathroom;Assistance with cooking/housework;Assist for transportation;Help with stairs or ramp for entrance ? ?  ?Functional Status Assessment ? Patient has had a recent decline in their functional status and demonstrates the ability to make significant improvements in function in a reasonable and predictable amount of time.  ?Equipment Recommendations ? Other (comment) (total hip kit ( if they are unable to find theirs at home))  ?  ?Recommendations for Other Services   ? ? ?  ?Precautions /  Restrictions Precautions ?Precaution Comments: abdominal surgery ?Restrictions ?Weight Bearing Restrictions: No  ? ?  ? ?Mobility Bed Mobility ?Overal bed mobility: Needs Assistance ?Bed Mobility: Supine to Sit ?  ?  ?Supine to sit: Min assist, HOB elevated ?  ?  ?General bed mobility comments: with education on log rolling technique ?  ? ?Transfers ?  ?  ?  ?  ?  ?  ?  ?  ?  ?  ?  ? ?  ?Balance Overall balance assessment: Needs assistance ?Sitting-balance support: No upper extremity supported, Feet supported ?Sitting balance-Leahy Scale: Good ?  ?  ?Standing balance support: During functional activity, No upper extremity supported ?Standing balance-Leahy Scale: Fair ?  ?  ?  ?  ?  ?  ?  ?  ?  ?  ?  ?  ?   ? ?ADL either performed or assessed with clinical judgement  ? ?ADL Overall ADL's : Needs assistance/impaired ?Eating/Feeding: Modified independent;Sitting ?  ?Grooming: Dance movement psychotherapist;Wash/dry hands;Standing;Set up;Supervision/safety ?  ?Upper Body Bathing: Supervision/ safety;Sitting ?  ?Lower Body Bathing: Supervison/ safety;Minimal assistance ?Lower Body Bathing Details (indicate cue type and reason): with education on using AE for LB bathing tasks with patient currently having difficulty with simulation of washing legs and bringing ankles to lap at this time. ?Upper Body Dressing : Set up;Sitting ?  ?Lower Body Dressing: Minimal assistance;With adaptive equipment ?Lower Body Dressing Details (indicate cue type and reason): patient was re educated on how to use AE with patient able to teach back using AE to don/doff socks and simulated pants on this date. patient requested repeat training during next session. ?Toilet Transfer: Supervision/safety;Set up;Rolling walker (2 wheels);Regular Toilet;Ambulation ?  ?  Toileting- Clothing Manipulation and Hygiene: Supervision/safety;Sit to/from stand ?  ?  ?  ?Functional mobility during ADLs: Supervision/safety;Set up ?   ? ? ? ?Vision Baseline Vision/History: 1 Wears  glasses ?Patient Visual Report: No change from baseline ?   ?   ?Perception   ?  ?Praxis   ?  ? ?Pertinent Vitals/Pain Pain Assessment ?Pain Assessment: 0-10 ?Pain Score: 5  ?Pain Location: lower belly at rest  ? ? ? ?Hand Dominance Right ?  ?Extremity/Trunk Assessment Upper Extremity Assessment ?Upper Extremity Assessment: Overall WFL for tasks assessed ?  ?Lower Extremity Assessment ?Lower Extremity Assessment: Defer to PT evaluation ?  ?Cervical / Trunk Assessment ?Cervical / Trunk Assessment: Normal ?  ?Communication Communication ?Communication: No difficulties ?  ?Cognition Arousal/Alertness: Awake/alert ?Behavior During Therapy: [facility] for tasks assessed/performed ?Overall Cognitive Status: Within Functional Limits for tasks assessed ?  ?  ?  ?  ?  ?  ?  ?  ?  ?  ?  ?  ?  ?  ?  ?  ?General Comments: husband was present during session as well ?  ?  ?General Comments    ? ?  ?Exercises   ?  ?Shoulder Instructions    ? ? ?Home Living Family/patient expects to be discharged to:: Private residence ?Living Arrangements: Spouse/significant other ?  ?Type of Home: House ?Home Access: Stairs to enter ?Entrance Stairs-Number of Steps: 2 ?Entrance Stairs-Rails: Can reach both (two grab bars) ?Home Layout: One level ?  ?  ?  ?  ?  ?  ?  ?Home Equipment: Conservation officer, nature (2 wheels);Cane - single point;Shower seat;BSC/3in1;Adaptive equipment ?Adaptive Equipment: Long-handled sponge;Long-handled shoe horn;Sock aid;Reacher ?  ?  ? ?  ?Prior Functioning/Environment Prior Level of Function : Independent/Modified Independent ?  ?  ?  ?  ?  ?  ?  ?  ?  ? ?  ?  ?OT Problem List: Decreased activity tolerance;Impaired balance (sitting and/or standing);Decreased knowledge of use of DME or AE;Pain ?  ?   ?OT Treatment/Interventions: Self-care/ADL training;Patient/family education;DME and/or AE instruction;Therapeutic activities  ?  ?OT Goals(Current goals can be found in the care plan section) Acute Rehab OT Goals ?Patient Stated Goal: to  get better ?OT Goal Formulation: With patient ?Time For Goal Achievement: 09/10/21 ?Potential to Achieve Goals: Good  ?OT Frequency: Min 2X/week ?  ? ?Co-evaluation   ?  ?  ?  ?  ? ?  ?AM-PAC OT "6 Clicks" Daily Activity     ?Outcome Measure Help from another person eating meals?: None ?Help from another person taking care of personal grooming?: None ?Help from another person toileting, which includes using toliet, bedpan, or urinal?: A Little ?Help from another person bathing (including washing, rinsing, drying)?: A Little ?Help from another person to put on and taking off regular upper body clothing?: A Little ?Help from another person to put on and taking off regular lower body clothing?: A Little ?6 Click Score: 20 ?  ?End of Session Equipment Utilized During Treatment: Rolling walker (2 wheels) ?Nurse Communication: Other (comment) (ok for patient to participate in therapy) ? ?Activity Tolerance: Patient tolerated treatment well ?Patient left: in bed;with call bell/phone within reach;with family/visitor present ? ?OT Visit Diagnosis: Unsteadiness on feet (R26.81);Pain  ?              ?Time: 7062-3762 ?OT Time Calculation (min): 30 min ?Charges:  OT General Charges ?$OT Visit: 1 Visit ?OT Evaluation ?$OT Eval Low Complexity: 1 Low ?OT Treatments ?$Self  Care/Home Management : 8-22 mins ? ?Anwita Mencer OTR/L, MS ?Acute Rehabilitation Department ?Office# [phone] ?Pager# [phone] ? ? ?Frances Gray ?08/27/2021, 3:42 PM ?

## 2021-08-27 NOTE — Evaluation (Signed)
Physical Therapy Evaluation ?Patient Details ?Name: Frances Gray ?MRN: [id number] ?DOB: [birth year] ?Today's Date: 08/27/2021 ? ?History of Present Illness ? Patient is a 71 year old female who was admitted with sigmoid diverticulitis with colo-uterine abscess. patient underwent robotic low anterior rectosigmoid resection, left salpingo-oophorextomy, drainage of colo-uterine pelvic abscess on 08/24/21.  PMH: R TKA, HTN, HLD, L TKA 2014  ?Clinical Impression ? Patient evaluated by Physical Therapy with no further acute PT needs identified. All education has been completed and the patient has no further questions.  ?Pt overall mod I with mobility. Pt may amb with family assist from PT standpoint. ?No further PT needs ? See below for any follow-up Physical Therapy or equipment needs. PT is signing off. Thank you for this referral. ?   ?   ? ?Recommendations for follow up therapy are one component of a multi-disciplinary discharge planning process, led by the attending physician.  Recommendations may be updated based on patient status, additional functional criteria and insurance authorization. ? ?Follow Up Recommendations No PT follow up ? ?  ?Assistance Recommended at Discharge PRN  ?Patient can return home with the following ? Help with stairs or ramp for entrance;Assistance with cooking/housework ? ?  ?Equipment Recommendations None recommended by PT  ?Recommendations for Other Services ?    ?  ?Functional Status Assessment Patient has had a recent decline in their functional status and demonstrates the ability to make significant improvements in function in a reasonable and predictable amount of time.  ? ?  ?Precautions / Restrictions Precautions ?Precaution Comments: abdominal surgery ?Restrictions ?Weight Bearing Restrictions: No  ? ?  ? ?Mobility ? Bed Mobility ?  ?  ?  ?  ?  ?  ?  ?General bed mobility comments: with education on log rolling technique ?  ? ?Transfers ?Overall transfer level: Needs  assistance ?Equipment used: Rolling walker (2 wheels) ?Transfers: Sit to/from Stand ?Sit to Stand: Supervision, Modified independent (Device/Increase time) ?  ?  ?  ?  ?  ?  ?  ? ?Ambulation/Gait ?Ambulation/Gait assistance: Modified independent (Device/Increase time) ?Gait Distance (Feet): 200 Feet ?Assistive device: Rolling walker (2 wheels) ?Gait Pattern/deviations: Step-through pattern, WFL(Within Functional Limits) ?  ?  ?  ?General Gait Details: light use of RW, steady gait, no LOB ? ?Stairs ?  ?  ?  ?  ?  ? ?Wheelchair Mobility ?  ? ?Modified Rankin (Stroke Patients Only) ?  ? ?  ? ?Balance   ?  ?  ?  ?  ?Standing balance support: During functional activity, No upper extremity supported ?  ?Standing balance comment: fair+, not tested to moderate challenges ?  ?  ?  ?  ?  ?  ?  ?  ?  ?  ?  ?   ? ? ? ?Pertinent Vitals/Pain Pain Assessment ?Pain Assessment: 0-10 ?Pain Score: 4  ?Pain Location: lower abd ?Pain Descriptors / Indicators: Discomfort ?Pain Intervention(s): Monitored during session, Limited activity within patient's tolerance, Premedicated before session  ? ? ?Home Living Family/patient expects to be discharged to:: Private residence ?Living Arrangements: Spouse/significant other ?Available Help at Discharge: Family ?Type of Home: House ?Home Access: Stairs to enter ?Entrance Stairs-Rails: Can reach both ?Entrance Stairs-Number of Steps: 2 ?  ?Home Layout: One level ?Home Equipment: Conservation officer, nature (2 wheels);Cane - single point;Shower seat;BSC/3in1;Adaptive equipment ?   ?  ?Prior Function Prior Level of Function : Independent/Modified Independent ?  ?  ?  ?  ?  ?  ?  ?  ?  ? ? ?  Hand Dominance  ? Dominant Hand: Right ? ?  ?Extremity/Trunk Assessment  ? Upper Extremity Assessment ?Upper Extremity Assessment: Overall WFL for tasks assessed ?  ? ?Lower Extremity Assessment ?Lower Extremity Assessment: Overall WFL for tasks assessed ?  ? ?Cervical / Trunk Assessment ?Cervical / Trunk Assessment: Normal   ?Communication  ? Communication: No difficulties  ?Cognition Arousal/Alertness: Awake/alert ?Behavior During Therapy: [place] for tasks assessed/performed ?Overall Cognitive Status: Within Functional Limits for tasks assessed ?  ?  ?  ?  ?  ?  ?  ?  ?  ?  ?  ?  ?  ?  ?  ?  ?  ?  ?  ? ?  ?General Comments   ? ?  ?Exercises    ? ?Assessment/Plan  ?  ?PT Assessment Patient does not need any further PT services  ?PT Problem List   ? ?   ?  ?PT Treatment Interventions     ? ?PT Goals (Current goals can be found in the Care Plan section)  ?Acute Rehab PT Goals ?PT Goal Formulation: All assessment and education complete, DC therapy ? ?  ?Frequency   ?  ? ? ?Co-evaluation   ?  ?  ?  ?  ? ? ?  ?AM-PAC PT "6 Clicks" Mobility  ?Outcome Measure Help needed turning from your back to your side while in a flat bed without using bedrails?: A Little ?Help needed moving from lying on your back to sitting on the side of a flat bed without using bedrails?: A Little ?Help needed moving to and from a bed to a chair (including a wheelchair)?: None ?Help needed standing up from a chair using your arms (e.g., wheelchair or bedside chair)?: None ?Help needed to walk in hospital room?: None ?Help needed climbing 3-5 steps with a railing? : None ?6 Click Score: 22 ? ?  ?End of Session Equipment Utilized During Treatment: Gait belt ?Activity Tolerance: Patient tolerated treatment well ?Patient left: with call bell/phone within reach;with bed alarm set (EOB) ?  ?PT Visit Diagnosis: Difficulty in walking, not elsewhere classified (R26.2) ?  ? ?Time: 1610-9604 ?PT Time Calculation (min) (ACUTE ONLY): 11 min ? ? ?Charges:   PT Evaluation ?$PT Eval Low Complexity: 1 Low ?  ?  ?   ? ? ?Baxter Flattery, PT ? ?Acute Rehab Dept [facility]) [phone] ?Pager [phone] ? ?08/27/2021 ? ? ?Shatina Streets ?08/27/2021, 5:28 PM ? ?

## 2021-08-27 NOTE — Progress Notes (Signed)
? ?Frances Gray ?[id number] ?1950-11-22 ? ?CARE TEAM: ? ?PCP: Binnie Rail, MD ? ?Outpatient Care Team: Patient Care Team: ?Binnie Rail, MD as PCP - General (Internal Medicine) ?Festus Aloe, MD (Urology) ?Marchia Bond, MD (Orthopedic Surgery) ?Armandina Gemma, MD (General Surgery) ?Katy Apo, MD (Ophthalmology) ?Princess Bruins, MD (Obstetrics and Gynecology) ?Inda Castle, MD (Inactive) (Gastroenterology) ?Foltanski, Cleaster Corin, [facility] as Pharmacist (Pharmacist) ?Michael Boston, MD as Consulting Physician (Colon and Rectal Surgery) ? ?Inpatient Treatment Team: Treatment Team: Attending Provider: Michael Boston, MD; Registered Nurse: Corinna Lines, RN; Utilization Review: Miringu, Mikael Spray, RN; Registered Nurse: Kai Levins, RN; Pharmacist: Lenis Noon, [facility] ? ? ?Problem List:  ? ?Principal Problem: ?  Rectosigmoid diverticulitis ?Active Problems: ?  Hypertension ?  Vitamin D deficiency disease ?  Severe obesity (BMI >= 40) (HCC) ?  Osteopenia ?  Hyperlipidemia ?  Knee osteoarthritis ?  Diabetes mellitus without complication ([place]) ?  Hepatic steatosis ?  Sciatica of left side ?  Pericolonic abscess due to diverticulitis ? ? ?3 Days Post-Op  08/24/2021 ? ?POST-OPERATIVE DIAGNOSIS:  SIGMOID DIVERTICULITIS WITH COLO-UTERINE ABSCESS ?  ?PROCEDURE:  ?-ROBOTIC LOW ANTERIOR RECTOSIGMOID RESECTION ?-LEFT SALPINGO-OOPHORECTOMY ?-DRAINAGE OF COLO-UTERINE PELVIC ABSCESS ?-INTRAOPERATIVE ASSESSMENT OF TISSUE VASCULAR PERFUSION USING ICG (indocyanine green) IMMUNOFLUORESCENCE ?-TRANSVERSUS ABDOMINIS PLANE (TAP) BLOCK - BILATERAL ?  ?SURGEON:  Adin Hector, MD ?  ?OR FINDINGS:  ?  ?Patient had inflamed sigmoid phlegmon with dense adhesions to left adnexa round ligament and dome of the uterus with contained abscess within.  Anatomy rather obliterated.  En bloc left salpingectomy needed to be done. ?  ?No obvious metastatic disease on visceral parietal peritoneum or liver. ?  ?The anastomosis rests 9 cm  from the anal verge by rigid proctoscopy. ? ? ?Assessment ? ?Still with some ileus ? ?(Hospital Stay = 3 days) ? ?Plan: ? ?ERAS enhance recovery protocol. ? ?Try solid diet. ? ?Diabetic control. ? ?Hypertension control. ? ?Hypokalemia 2 days ago.  Recheck labs to make sure there is no electrolyte or other abnormalities that could be contributing to ileus. ? ?Follow-up on pathology.  I did discuss operative findings with patient ? ?-VTE prophylaxis- SCDs, enoxaparin ? ?-mobilize as tolerated to help recovery.  Have physical and Occupational Therapy help follow. ? ?Disposition:  ?Disposition:  ?The patient is from: Home ? ?Anticipate discharge to:  Home with Home Health ? ?Anticipated Date of Discharge is:  March 22,2023 ?  ? ?Barriers to discharge:  Pending Clinical improvement (more likely than not) ? ?Patient currently is NOT MEDICALLY STABLE for discharge from the hospital from a surgery standpoint. ? ? ? ? ? ?I reviewed nursing notes, last 24 h vitals and pain scores, last 48 h intake and output, last 24 h labs and trends, and last 24 h imaging results. I have reviewed this patient's available data, including medical history, events of note, test results, etc as part of my evaluation.  A significant portion of that time was spent in counseling.  Care during the described time interval was provided by me. ? ?This care required moderate level of medical decision making.  08/27/2021 ? ? ? ?Subjective: ?(Chief complaint) ? ?Tolerating pur?ed diet.  No flatus or bowel movement yet.  No nausea or. ? ?Tylenol and tramadol helping control pain. ? ?Claims she is walking the hallways with some help.  Walker in room. ? ?Objective: ? ?Vital signs: ? ?Vitals:  ? 08/26/21 1129 08/26/21 2138 08/27/21 0532 08/27/21 0555  ?BP: 124/68  123/62  123/66  ?Pulse: 70 70  67  ?Resp: '16 18  17  '$ ?Temp: 98.6 ?F (37 ?C) 98.5 ?F (36.9 ?C)  98.5 ?F (36.9 ?C)  ?TempSrc: Oral Oral  Oral  ?SpO2: 96% 97%  97%  ?Weight:   126.7 kg   ?Height:   '5\' 4"'$   (1.626 m)   ? ? ?Last BM Date : 08/25/21 ? ?Intake/Output  ? ?Yesterday: ? 03/19 0701 - 03/20 0700 ?In: 600 [P.O.:600] ?Out: 1800 [Urine:1800] ?This shift: ? No intake/output data recorded. ? ?Bowel function: ? Flatus: No ? BM:  No ? Drain: (No drain) ? ? ?Physical Exam: ? ?General: Pt awake/alert in no acute distress.  Sitting in the bed calm.  Not toxic nor sickly. ?Eyes: PERRL, normal EOM.  Sclera clear.  No icterus ?Neuro: CN II-XII intact w/o focal sensory/motor deficits. ?Lymph: No head/neck/groin lymphadenopathy ?Psych:  No delerium/psychosis/paranoia.  Oriented x 4.  Mildly frustrated but consolable ?HENT: Normocephalic, Mucus membranes moist.  No thrush ?Neck: Supple, No tracheal deviation.  No obvious thyromegaly ?Chest: No pain to chest wall compression.  Good respiratory excursion.  No audible wheezing ?CV:  Pulses intact.  Regular rhythm.  No major extremity edema ?MS: Normal AROM mjr joints.  No obvious deformity ? ?Abdomen:  Morbidly obese but soft.  Panniculus clean. .  Nondistended.  Mildly tender at incisions only.  No evidence of peritonitis.  No incarcerated hernias. ? ?Ext:   No deformity.  No mjr edema.  No cyanosis ?Skin: No petechiae / purpurea.  No major sores.  Warm and dry ? ? ? ?Results:  ? ?Cultures: ?Recent Results (from the past 720 hour(s))  ?SARS CORONAVIRUS 2 (TAT 6-24 HRS) Nasopharyngeal Nasopharyngeal Swab     Status: None  ? Collection Time: 08/22/21  1:53 PM  ? Specimen: Nasopharyngeal Swab  ?Result Value Ref Range Status  ? SARS Coronavirus 2 NEGATIVE NEGATIVE Final  ?  Comment: (NOTE) ?SARS-CoV-2 target nucleic acids are NOT DETECTED. ? ?The SARS-CoV-2 RNA is generally detectable in upper and lower ?respiratory specimens during the acute phase of infection. Negative ?results do not preclude SARS-CoV-2 infection, do not rule out ?co-infections with other pathogens, and should not be used as the ?sole basis for treatment or other patient management decisions. ?Negative results  must be combined with clinical observations, ?patient history, and epidemiological information. The expected ?result is Negative. ? ?Fact Sheet for Patients: ?SugarRoll.be ? ?Fact Sheet for Healthcare Providers: ?https://www.woods-mathews.com/ ? ?This test is not yet approved or cleared by the [place] FDA and  ?has been authorized for detection and/or diagnosis of SARS-CoV-2 by ?FDA under an Emergency Use Authorization (EUA). This EUA will remain  ?in effect (meaning this test can be used) for the duration of the ?COVID-19 declaration under Se ction 564(b)(1) of the Act, 21 U.S.C. ?section 360bbb-3(b)(1), unless the authorization is terminated or ?revoked sooner. ? ?Performed at [facility], [place] [address], [place], [place] ?[address] ?  ? ? ?Labs: ?Results for orders placed or performed during the hospital encounter of 08/24/21 (from the past 48 hour(s))  ?Glucose, capillary     Status: Abnormal  ? Collection Time: 08/25/21 11:51 AM  ?Result Value Ref Range  ? Glucose-Capillary 163 (H) 70 - 99 mg/dL  ?  Comment: Glucose reference range applies only to samples taken after fasting for at least 8 hours.  ?Glucose, capillary     Status: Abnormal  ? Collection Time: 08/25/21  5:17 PM  ?Result Value Ref Range  ?  Glucose-Capillary 159 (H) 70 - 99 mg/dL  ?  Comment: Glucose reference range applies only to samples taken after fasting for at least 8 hours.  ?Glucose, capillary     Status: Abnormal  ? Collection Time: 08/25/21  9:56 PM  ?Result Value Ref Range  ? Glucose-Capillary 152 (H) 70 - 99 mg/dL  ?  Comment: Glucose reference range applies only to samples taken after fasting for at least 8 hours.  ?Glucose, capillary     Status: Abnormal  ? Collection Time: 08/26/21  7:35 AM  ?Result Value Ref Range  ? Glucose-Capillary 144 (H) 70 - 99 mg/dL  ?  Comment: Glucose reference range applies only to samples taken after fasting for at least 8 hours.  ?Glucose, capillary      Status: Abnormal  ? Collection Time: 08/26/21 11:19 AM  ?Result Value Ref Range  ? Glucose-Capillary 111 (H) 70 - 99 mg/dL  ?  Comment: Glucose reference range applies only to samples taken after fastin

## 2021-08-27 NOTE — Progress Notes (Signed)
Pt states she will need to use a walker when she gets home, and she already has one at home to use.  ?

## 2021-08-28 LAB — POTASSIUM: Potassium: 3.6 mmol/L (ref 3.5–5.1)

## 2021-08-28 LAB — GLUCOSE, CAPILLARY: Glucose-Capillary: 117 mg/dL — ABNORMAL HIGH (ref 70–99)

## 2021-08-28 MED ORDER — POLYETHYLENE GLYCOL 3350 17 G PO PACK
34.0000 g | PACK | Freq: Once | ORAL | Status: AC
  Administered 2021-08-28: 34 g via ORAL
  Filled 2021-08-28: qty 2

## 2021-08-28 MED ORDER — SENNOSIDES-DOCUSATE SODIUM 8.6-50 MG PO TABS: 2.0000 | ORAL_TABLET | Freq: Every day | ORAL | Status: DC

## 2021-08-28 MED ORDER — SENNOSIDES-DOCUSATE SODIUM 8.6-50 MG PO TABS
2.0000 | ORAL_TABLET | Freq: Once | ORAL | Status: DC
  Filled 2021-08-28: qty 2

## 2021-08-28 NOTE — Discharge Summary (Signed)
Physician Discharge Summary  ? ? ?Patient ID: ?Frances Gray ?MRN: [id number] ?DOB/AGE: 1950/08/26  ?71 y.o. ? ?Patient Care Team: ?Binnie Rail, MD as PCP - General (Internal Medicine) ?Festus Aloe, MD (Urology) ?Marchia Bond, MD (Orthopedic Surgery) ?Armandina Gemma, MD (General Surgery) ?Katy Apo, MD (Ophthalmology) ?Princess Bruins, MD (Obstetrics and Gynecology) ?Inda Castle, MD (Inactive) (Gastroenterology) ?Foltanski, Cleaster Corin, [facility] as Pharmacist (Pharmacist) ?Michael Boston, MD as Consulting Physician (Colon and Rectal Surgery) ? ?Admit date: 08/24/2021 ? ?Discharge date: 08/28/2021 ? ?Hospital Stay = 4 days ? ? ? ?Discharge Diagnoses:  ?Principal Problem: ?  Rectosigmoid diverticulitis ?Active Problems: ?  Hypertension ?  Vitamin D deficiency disease ?  Severe obesity (BMI >= 40) (HCC) ?  Osteopenia ?  Hyperlipidemia ?  Knee osteoarthritis ?  Diabetes mellitus without complication ([place]) ?  Hepatic steatosis ?  Sciatica of left side ?  Pericolonic abscess due to diverticulitis ? ? ?4 Days Post-Op  08/24/2021 ? ?POST-OPERATIVE DIAGNOSIS:  SIGMOID DIVERTICULITIS WITH COLO-UTERINE ABSCESS ?  ?PROCEDURE:  ?-ROBOTIC LOW ANTERIOR RECTOSIGMOID RESECTION ?-LEFT SALPINGO-OOPHORECTOMY ?-DRAINAGE OF COLO-UTERINE PELVIC ABSCESS ?-INTRAOPERATIVE ASSESSMENT OF TISSUE VASCULAR PERFUSION USING ICG (indocyanine green) IMMUNOFLUORESCENCE ?-TRANSVERSUS ABDOMINIS PLANE (TAP) BLOCK - BILATERAL ?  ?SURGEON:  Adin Hector, MD ?  ?OR FINDINGS:  ?  ?Patient had inflamed sigmoid phlegmon with dense adhesions to left adnexa round ligament and dome of the uterus with contained abscess within.  Anatomy rather obliterated.  En bloc left salpingectomy needed to be done. ?  ?No obvious metastatic disease on visceral parietal peritoneum or liver. ?  ?The anastomosis rests 9 cm from the anal verge by rigid proctoscopy ? ?Consults: Physical Therapy, Occupational Therapy, Case Management / Social Work, and Urology ? ?Hospital  Course:  ? ?The patient underwent the surgery above.  Postoperatively, the patient gradually mobilized and advanced to a solid diet.  Pain and other symptoms were treated aggressively.   ? ?By the time of discharge, the patient was walking well the hallways, eating food, having flatus.  Pain was well-controlled on an oral medications.  Based on meeting discharge criteria and continuing to recover, I felt it was safe for the patient to be discharged from the hospital to further recover with close followup. Postoperative recommendations were discussed in detail.  They are written as well. ? ?Discharged Condition: good ? ?Discharge Exam: ?Blood pressure (!) 142/69, pulse 71, temperature 98.7 ?F (37.1 ?C), temperature source Oral, resp. rate 18, height '5\' 4"'$  (1.626 m), weight 126.7 kg, SpO2 96 %. ? ?General: Pt awake/alert/oriented x4 in No acute distress ?Eyes: PERRL, normal EOM.  Sclera clear.  No icterus ?Neuro: CN II-XII intact w/o focal sensory/motor deficits. ?Lymph: No head/neck/groin lymphadenopathy ?Psych:  No delerium/psychosis/paranoia ?HENT: Normocephalic, Mucus membranes moist.  No thrush ?Neck: Supple, No tracheal deviation ?Chest:  No chest wall pain w good excursion ?CV:  Pulses intact.  Regular rhythm ?MS: Normal AROM mjr joints.  No obvious deformity ?Abdomen: Soft.  Nondistended.  Mildly tender at incisions only.  No evidence of peritonitis.  No incarcerated hernias. ?Ext:  SCDs BLE.  No mjr edema.  No cyanosis ?Skin: No petechiae / purpura ? ? ?Disposition:  ? ? Follow-up Information   ? ? Michael Boston, MD Follow up.   ?Specialties: General Surgery, Colon and Rectal Surgery ?Contact information: ?[address] ?[address] ?[place] [address] ?[phone] ? ? ?  ?  ? ?  ?  ? ?  ? ? ?Discharge disposition: 01-Home or Self Care ? ? ? ? ? ? ?  Discharge Instructions   ? ? Call MD for:   Complete by: As directed ?  ? FEVER > 101.5 F  ?(temperatures < 101.5 F are not significant)  ? Call MD for:   extreme fatigue   Complete by: As directed ?  ? Call MD for:  persistant dizziness or light-headedness   Complete by: As directed ?  ? Call MD for:  persistant nausea and vomiting   Complete by: As directed ?  ? Call MD for:  redness, tenderness, or signs of infection (pain, swelling, redness, odor or green/yellow discharge around incision site)   Complete by: As directed ?  ? Call MD for:  severe uncontrolled pain   Complete by: As directed ?  ? Diet - low sodium heart healthy   Complete by: As directed ?  ? Start with a bland diet such as soups, liquids, starchy foods, low fat foods, etc. the first few days at home. ?Gradually advance to a solid, low-fat, high fiber diet by the end of the first week at home.   ?Add a fiber supplement to your diet (Metamucil, etc) ?If you feel full, bloated, or constipated, stay on a full liquid or pureed/blenderized diet for a few days until you feel better and are no longer constipated.  ? Discharge instructions   Complete by: As directed ?  ? See Discharge Instructions ?If you are not getting better after two weeks or are noticing you are getting worse, contact our office [phone] for further advice.  We may need to adjust your medications, re-evaluate you in the office, send you to the emergency room, or see what other things we can do to help. ?The clinic staff is available to answer your questions during regular business hours (8:30am-5pm).  Please don't hesitate to call and ask to speak to one of our nurses for clinical concerns.    ?A surgeon from [facility] is always on call at the hospitals 24 hours/day ?If you have a medical emergency, go to the nearest emergency room or call 911.  ? Discharge wound care:   Complete by: As directed ?  ? It is good for closed incisions and even open wounds to be washed every day.  Shower every day.  Short baths are fine.  Wash the incisions and wounds clean with soap & water.    ?You may leave closed incisions open to  air if it is dry.   You may cover the incision with clean gauze & replace it after your daily shower for comfort. ? ?  ? Driving Restrictions   Complete by: As directed ?  ? You may drive when: ?- you are no longer taking narcotic prescription pain medication ?- you can comfortably wear a seatbelt ?- you can safely make sudden turns/stops without pain.  ? Increase activity slowly   Complete by: As directed ?  ? Start light daily activities --- self-care, walking, climbing stairs- beginning the day after surgery.  Gradually increase activities as tolerated.  Control your pain to be active.  Stop when you are tired.  Ideally, walk several times a day, eventually an hour a day.   ?Most people are back to most day-to-day activities in a few weeks.  It takes 4-6 weeks to get back to unrestricted, intense activity. ?If you can walk 30 minutes without difficulty, it is safe to try more intense activity such as jogging, treadmill, bicycling, low-impact aerobics, swimming, etc. ?Save the most intensive and strenuous activity  for last (Usually 4-8 weeks after surgery) such as sit-ups, heavy lifting, contact sports, etc.  Refrain from any intense heavy lifting or straining until you are off narcotics for pain control.  You will have off days, but things should improve week-by-week. ?DO NOT PUSH THROUGH PAIN.  Let pain be your guide: If it hurts to do something, don't do it.  ? Lifting restrictions   Complete by: As directed ?  ? If you can walk 30 minutes without difficulty, it is safe to try more intense activity such as jogging, treadmill, bicycling, low-impact aerobics, swimming, etc. ?Save the most intensive and strenuous activity for last (Usually 4-8 weeks after surgery) such as sit-ups, heavy lifting, contact sports, etc.   ?Refrain from any intense heavy lifting or straining until you are off narcotics for pain control.  You will have off days, but things should improve week-by-week. ?DO NOT PUSH THROUGH PAIN.  Let  pain be your guide: If it hurts to do something, don't do it.  Pain is your body warning you to avoid that activity for another week until the pain goes down.  ? May shower / Bathe   Complete by: As directed

## 2021-08-28 NOTE — Progress Notes (Signed)
Transition of Care (TOC) Screening Note ? ?Patient Details  ?Name: Frances Gray ?Date of Birth: 1951-06-02 ? ?Transition of Care (TOC) CM/SW Contact:    ?Sherie Don, LCSW ?Phone Number: ?08/28/2021, 10:09 AM ? ?Transition of Care Department [facility]) has reviewed patient and no TOC needs have been identified at this time. We will continue to monitor patient advancement through interdisciplinary progression rounds. If new patient transition needs arise, please place a TOC consult. ?

## 2021-08-28 NOTE — Progress Notes (Signed)
Occupational Therapy Treatment ?Patient Details ?Name: Frances Gray ?MRN: [id number] ?DOB: 21-Sep-1950 ?Today's Date: 08/28/2021 ? ? ?History of present illness Patient is a 71 year old female who was admitted with sigmoid diverticulitis with colo-uterine abscess. patient underwent robotic low anterior rectosigmoid resection, left salpingo-oophorextomy, drainage of colo-uterine pelvic abscess on 08/24/21.  PMH: R TKA, HTN, HLD, L TKA 2014 ?  ?OT comments ? Patient was MI for standing grooming tasks in room with education on walker tray/basket to transport items with RW provided. Patient verbalized understanding. Patient was educated on ECT. Patient was able to verbalize carryover from AE training previous session. Patient verbalized and demonstrated understanding. Patient plans to d/c home with husband on this date.   ? ?Recommendations for follow up therapy are one component of a multi-disciplinary discharge planning process, led by the attending physician.  Recommendations may be updated based on patient status, additional functional criteria and insurance authorization. ?   ?Follow Up Recommendations ? No OT follow up  ?  ?Assistance Recommended at Discharge PRN  ?Patient can return home with the following ? A little help with bathing/dressing/bathroom;Assistance with cooking/housework;Assist for transportation;Help with stairs or ramp for entrance ?  ?Equipment Recommendations ? Other (comment) (total hip kit)  ?  ?Recommendations for Other Services   ? ?  ?Precautions / Restrictions Precautions ?Precaution Comments: abdominal surgery ?Restrictions ?Weight Bearing Restrictions: No  ? ? ?  ? ?Mobility Bed Mobility ?  ?  ?  ?  ?  ?  ?  ?General bed mobility comments: patient was sitting on edge of bed at start of session and returned to the same ?  ? ?Transfers ?  ?  ?  ?  ?  ?  ?  ?  ?  ?  ?  ?  ?Balance   ?  ?  ?  ?  ?  ?  ?  ?  ?  ?  ?  ?  ?  ?  ?  ?  ?  ?  ?   ? ?ADL either performed or assessed with clinical  judgement  ? ?ADL Overall ADL's : Needs assistance/impaired ?  ?  ?Grooming: Dance movement psychotherapist;Wash/dry hands;Standing;Set up;Supervision/safety;Oral care ?Grooming Details (indicate cue type and reason): with RW ?  ?  ?  ?  ?  ?  ?  ?Lower Body Dressing Details (indicate cue type and reason): patient declined to trial today but verbalized that she knew process and demonstrated understanding from previous session ?  ?  ?  ?  ?  ?  ?Functional mobility during ADLs: Supervision/safety;Set up ?General ADL Comments: in room ?  ? ?Extremity/Trunk Assessment   ?  ?  ?  ?  ?  ? ?Vision   ?  ?  ?Perception   ?  ?Praxis   ?  ? ?Cognition Arousal/Alertness: Awake/alert ?Behavior During Therapy: [facility] for tasks assessed/performed ?Overall Cognitive Status: Within Functional Limits for tasks assessed ?  ?  ?  ?  ?  ?  ?  ?  ?  ?  ?  ?  ?  ?  ?  ?  ?  ?  ?  ?   ?Exercises   ? ?  ?Shoulder Instructions   ? ? ?  ?General Comments    ? ? ?Pertinent Vitals/ Pain       Pain Assessment ?Pain Assessment: Faces ?Faces Pain Scale: Hurts little more ?Pain Location: lower abd ?Pain Descriptors / Indicators: Discomfort ?Pain Intervention(s): Limited activity within  patient's tolerance, Monitored during session ? ?Home Living   ?  ?  ?  ?  ?  ?  ?  ?  ?  ?  ?  ?  ?  ?  ?  ?  ?  ?  ? ?  ?Prior Functioning/Environment    ?  ?  ?  ?   ? ?Frequency ?    ? ? ? ? ?  ?Progress Toward Goals ? ?OT Goals(current goals can now be found in the care plan section) ? Progress towards OT goals: Progressing toward goals ? ?   ?Plan Discharge plan remains appropriate   ? ?Co-evaluation ? ? ?   ?  ?  ?  ?  ? ?  ?AM-PAC OT "6 Clicks" Daily Activity     ?Outcome Measure ? ? Help from another person eating meals?: None ?Help from another person taking care of personal grooming?: None ?Help from another person toileting, which includes using toliet, bedpan, or urinal?: None ?Help from another person bathing (including washing, rinsing, drying)?: A Little ?Help from  another person to put on and taking off regular upper body clothing?: None ?Help from another person to put on and taking off regular lower body clothing?: A Little ?6 Click Score: 22 ? ?  ?End of Session Equipment Utilized During Treatment: Rolling walker (2 wheels) ? ?OT Visit Diagnosis: Unsteadiness on feet (R26.81);Pain ?  ?Activity Tolerance Patient tolerated treatment well ?  ?Patient Left in bed;with call bell/phone within reach ?  ?Nurse Communication Other (comment) (ok to participate in therapy) ?  ? ?   ? ?Time: 6060-0459 ?OT Time Calculation (min): 11 min ? ?Charges: OT General Charges ?$OT Visit: 1 Visit ?OT Treatments ?$Self Care/Home Management : 8-22 mins ? ?Frances Gray OTR/L, MS ?Acute Rehabilitation Department ?Office# [phone] ?Pager# [phone] ? ? ?Frances Gray Frances Gray ?08/28/2021, 9:38 AM ?

## 2021-08-28 NOTE — Plan of Care (Signed)
Instructions were reviewed with patient. All questions were answered. Patient was transported to main entrance by wheelchair. ° °

## 2021-08-28 NOTE — Progress Notes (Signed)
Patient status post robotic rectosigmoid resection for chronic diverticulitis with contained abscess. ? ?Pathology consistent with diverticulitis with no malignancy.  Associated abscesses.  En bloc ovary and fallopian tube as well. ?Path benign. ?I told the pt the good news at the bedside in her room. ?

## 2021-08-28 NOTE — Progress Notes (Signed)
? ?Frances Gray ?[id number] ?1951-01-09 ? ?CARE TEAM: ? ?PCP: Binnie Rail, MD ? ?Outpatient Care Team: Patient Care Team: ?Binnie Rail, MD as PCP - General (Internal Medicine) ?Festus Aloe, MD (Urology) ?Marchia Bond, MD (Orthopedic Surgery) ?Armandina Gemma, MD (General Surgery) ?Katy Apo, MD (Ophthalmology) ?Princess Bruins, MD (Obstetrics and Gynecology) ?Inda Castle, MD (Inactive) (Gastroenterology) ?Foltanski, Cleaster Corin, [facility] as Pharmacist (Pharmacist) ?Michael Boston, MD as Consulting Physician (Colon and Rectal Surgery) ? ?Inpatient Treatment Team: Treatment Team: Attending Provider: Michael Boston, MD; Registered Nurse: Verdie Drown, RN; Utilization Review: Miringu, Mikael Spray, RN; Occupational Therapist: Marcellina Millin, OT ? ? ?Problem List:  ? ?Principal Problem: ?  Rectosigmoid diverticulitis ?Active Problems: ?  Hypertension ?  Vitamin D deficiency disease ?  Severe obesity (BMI >= 40) (HCC) ?  Osteopenia ?  Hyperlipidemia ?  Knee osteoarthritis ?  Diabetes mellitus without complication ([place]) ?  Hepatic steatosis ?  Sciatica of left side ?  Pericolonic abscess due to diverticulitis ? ? ?4 Days Post-Op  08/24/2021 ? ?POST-OPERATIVE DIAGNOSIS:  SIGMOID DIVERTICULITIS WITH COLO-UTERINE ABSCESS ?  ?PROCEDURE:  ?-ROBOTIC LOW ANTERIOR RECTOSIGMOID RESECTION ?-LEFT SALPINGO-OOPHORECTOMY ?-DRAINAGE OF COLO-UTERINE PELVIC ABSCESS ?-INTRAOPERATIVE ASSESSMENT OF TISSUE VASCULAR PERFUSION USING ICG (indocyanine green) IMMUNOFLUORESCENCE ?-TRANSVERSUS ABDOMINIS PLANE (TAP) BLOCK - BILATERAL ?  ?SURGEON:  Adin Hector, MD ?  ?OR FINDINGS:  ?  ?Patient had inflamed sigmoid phlegmon with dense adhesions to left adnexa round ligament and dome of the uterus with contained abscess within.  Anatomy rather obliterated.  En bloc left salpingectomy needed to be done. ?  ?No obvious metastatic disease on visceral parietal peritoneum or liver. ?  ?The anastomosis rests 9 cm from the anal verge by rigid  proctoscopy. ? ? ?Assessment ? ?Slowly improving ? ?(Hospital Stay = 4 days) ? ?Plan: ? ?ERAS enhanced recovery protocol. ? ?Remove dressing. ? ?Continue to be bed locked and keep on the dry side. ? ?Continue solid diet. ? ?Diabetic control. ? ?Hypertension control. ? ?Hypokalemia -replace more aggressive yesterday.  Potassium now in low range of normal.   ? ?Follow-up on pathology.  I did discuss operative findings and pathology confirming diverticulitis with patient ? ?-VTE prophylaxis- SCDs, enoxaparin ? ?-mobilize as tolerated to help recovery.  Have physical and Occupational Therapy help follow. ? ?I suspect she will be able to be discharged home later this afternoon if she is feeling better and tolerating solid diet for more than 24 hours.  We will give extra Senokot dose to help as well.  Continue fiber replacement ? ?Disposition:  ?Disposition:  ?The patient is from: Home ? ?Anticipate discharge to:  Home with Home Health ? ?Anticipated Date of Discharge is:  March 22,2023 ?  ? ?Barriers to discharge:  Pending Clinical improvement (more likely than not) ? ?Patient currently is NOT MEDICALLY STABLE for discharge from the hospital from a surgery standpoint. ? ? ? ? ? ?I reviewed nursing notes, last 24 h vitals and pain scores, last 48 h intake and output, last 24 h labs and trends, and last 24 h imaging results. I have reviewed this patient's available data, including medical history, events of note, test results, etc as part of my evaluation.  A significant portion of that time was spent in counseling.  Care during the described time interval was provided by me. ? ?This care required moderate level of medical decision making.  08/28/2021 ? ? ? ?Subjective: ?(Chief complaint) ? ?Tolerating solid diet.  No nausea or vomiting. ? ?  Walking more.  Getting to the bathroom.  Nursing helping.  Seen by therapies who felt she was relatively independent and did not need any home health or rehab. ? ?Patient tolerating  solid diet.  Appetite improving. ? ?Disappointed that she has not had a bowel movement yet.  Is reliant on Senokot at night ? ?Objective: ? ?Vital signs: ? ?Vitals:  ? 08/27/21 1050 08/27/21 1222 08/27/21 2107 08/28/21 0522  ?BP: 125/76 134/66 138/74 (!) 142/69  ?Pulse: 63 64 73 71  ?Resp:  '18 18 18  '$ ?Temp:  97.6 ?F (36.4 ?C) 98.3 ?F (36.8 ?C) 98.7 ?F (37.1 ?C)  ?TempSrc:  Oral Oral Oral  ?SpO2:  97% 97% 96%  ?Weight:      ?Height:      ? ? ?Last BM Date : 08/25/21 ? ?Intake/Output  ? ?Yesterday: ? 03/20 0701 - 03/21 0700 ?In: 1070.1 [P.O.:960; IV Piggyback:110.1] ?Out: 1050 [Urine:1050] ?This shift: ? Total I/O ?In: 600 [P.O.:600] ?Out: 600 [Urine:600] ? ?Bowel function: ? Flatus: No ? BM:  No ? Drain: (No drain) ? ? ?Physical Exam: ? ?General: Pt awake/alert in no acute distress.  Sitting in the bed calm.  Not toxic nor sickly. ?Eyes: PERRL, normal EOM.  Sclera clear.  No icterus ?Neuro: CN II-XII intact w/o focal sensory/motor deficits. ?Lymph: No head/neck/groin lymphadenopathy ?Psych:  No delerium/psychosis/paranoia.  Oriented x 4.  Mildly frustrated but consolable ?HENT: Normocephalic, Mucus membranes moist.  No thrush ?Neck: Supple, No tracheal deviation.  No obvious thyromegaly ?Chest: No pain to chest wall compression.  Good respiratory excursion.  No audible wheezing ?CV:  Pulses intact.  Regular rhythm.  No major extremity edema ?MS: Normal AROM mjr joints.  No obvious deformity ? ?Abdomen:  Morbidly obese but soft.  Panniculus clean. .  Nondistended.  Mildly tender at incisions only.  No evidence of peritonitis.  No incarcerated hernias. ? ?Ext:   No deformity.  No mjr edema.  No cyanosis ?Skin: No petechiae / purpurea.  No major sores.  Warm and dry ? ? ? ?Results:  ? ?Cultures: ?Recent Results (from the past 720 hour(s))  ?SARS CORONAVIRUS 2 (TAT 6-24 HRS) Nasopharyngeal Nasopharyngeal Swab     Status: None  ? Collection Time: 08/22/21  1:53 PM  ? Specimen: Nasopharyngeal Swab  ?Result Value Ref Range  Status  ? SARS Coronavirus 2 NEGATIVE NEGATIVE Final  ?  Comment: (NOTE) ?SARS-CoV-2 target nucleic acids are NOT DETECTED. ? ?The SARS-CoV-2 RNA is generally detectable in upper and lower ?respiratory specimens during the acute phase of infection. Negative ?results do not preclude SARS-CoV-2 infection, do not rule out ?co-infections with other pathogens, and should not be used as the ?sole basis for treatment or other patient management decisions. ?Negative results must be combined with clinical observations, ?patient history, and epidemiological information. The expected ?result is Negative. ? ?Fact Sheet for Patients: ?SugarRoll.be ? ?Fact Sheet for Healthcare Providers: ?https://www.woods-mathews.com/ ? ?This test is not yet approved or cleared by the [place] FDA and  ?has been authorized for detection and/or diagnosis of SARS-CoV-2 by ?FDA under an Emergency Use Authorization (EUA). This EUA will remain  ?in effect (meaning this test can be used) for the duration of the ?COVID-19 declaration under Se ction 564(b)(1) of the Act, 21 U.S.C. ?section 360bbb-3(b)(1), unless the authorization is terminated or ?revoked sooner. ? ?Performed at [facility], [place] [address] [place], [place] ?[address] ?  ? ? ?Labs: ?Results for orders placed or performed during the hospital encounter  of 08/24/21 (from the past 48 hour(s))  ?Glucose, capillary     Status: Abnormal  ? Collection Time: 08/26/21  7:35 AM  ?Result Value Ref Range  ? Glucose-Capillary 144 (H) 70 - 99 mg/dL  ?  Comment: Glucose reference range applies only to samples taken after fasting for at least 8 hours.  ?Glucose, capillary     Status: Abnormal  ? Collection Time: 08/26/21 11:19 AM  ?Result Value Ref Range  ? Glucose-Capillary 111 (H) 70 - 99 mg/dL  ?  Comment: Glucose reference range applies only to samples taken after fasting for at least 8 hours.  ?Glucose, capillary     Status: Abnormal  ? Collection  Time: 08/26/21  4:54 PM  ?Result Value Ref Range  ? Glucose-Capillary 119 (H) 70 - 99 mg/dL  ?  Comment: Glucose reference range applies only to samples taken after fasting for at least 8 hours.  ?Glucose, ca

## 2021-08-29 ENCOUNTER — Telehealth: Payer: Medicare Other

## 2021-10-01 ENCOUNTER — Other Ambulatory Visit: Payer: Self-pay | Admitting: Internal Medicine

## 2021-10-22 ENCOUNTER — Telehealth: Payer: Medicare Other

## 2021-10-25 DIAGNOSIS — L089 Local infection of the skin and subcutaneous tissue, unspecified: Secondary | ICD-10-CM | POA: Insufficient documentation

## 2021-10-25 DIAGNOSIS — L669 Cicatricial alopecia, unspecified: Secondary | ICD-10-CM | POA: Diagnosis not present

## 2021-10-25 DIAGNOSIS — L6681 Central centrifugal cicatricial alopecia: Secondary | ICD-10-CM | POA: Insufficient documentation

## 2021-10-29 ENCOUNTER — Telehealth: Payer: Medicare Other

## 2021-10-29 NOTE — Progress Notes (Deleted)
Chronic Care Management Pharmacy Note  10/29/2021 Name:  Frances Gray MRN:  [id number] DOB:  1951/02/08  Summary: -Pt reports compliance and denies issues -Pt is due for DEXA scan (last 06/2016)  Recommendations/Changes made from today's visit: -Advised to increase exercise -Contact eye doctor for eye exam records (last 05/2020) -Coordinate with PCP to order repeat DEXA scan   Subjective: Frances Gray is an 71 y.o. year old female who is a primary patient of Burns, Claudina Lick, MD.  The CCM team was consulted for assistance with disease management and care coordination needs.    Engaged with patient by telephone for follow up visit in response to provider referral for pharmacy case management and/or care coordination services.   Consent to Services:  The patient was given the following information about Chronic Care Management services today, agreed to services, and gave verbal consent: 1. CCM service includes personalized support from designated clinical staff supervised by the primary care provider, including individualized plan of care and coordination with other care providers 2. 24/7 contact phone numbers for assistance for urgent and routine care needs. 3. Service will only be billed when office clinical staff spend 20 minutes or more in a month to coordinate care. 4. Only one practitioner may furnish and bill the service in a calendar month. 5.The patient may stop CCM services at any time (effective at the end of the month) by phone call to the office staff. 6. The patient will be responsible for cost sharing (co-pay) of up to 20% of the service fee (after annual deductible is met). Patient agreed to services and consent obtained.  Patient Care Team: Binnie Rail, MD as PCP - General (Internal Medicine) Festus Aloe, MD (Urology) Marchia Bond, MD (Orthopedic Surgery) Armandina Gemma, MD (General Surgery) Katy Apo, MD (Ophthalmology) Princess Bruins, MD (Obstetrics  and Gynecology) Inda Castle, MD (Inactive) (Gastroenterology) Charlton Haws, [facility] as Pharmacist (Pharmacist) Michael Boston, MD as Consulting Physician (Colon and Rectal Surgery)   Patient is from [place] area, but spent her adult life in [place]. She moved back to [place] after retiring in 2012. She lives with her husband and 60 year old grandson who she has raised since he was 3.   Recent office visits: 05/23/2021 - Dr. Quay Burow - no changes to medications - f/u in 6 months   Recent consult visits: 05/16/2021 - Dr. Leonie Green - Dermatology - melasma - continue current medications - f/u in 1 year   Hospital visits: 08/24/2021 - Robotic low anterior rectosigmoid resection, drainage of colo-uterine pelvic abscess, left salpingo-oophorectomy, transversus abdominis plan block - bilateral  06/11/2021-06/14/2021 - Hospital admission - diverticulitis of colon with perforation   Objective:  Lab Results  Component Value Date   CREATININE 0.72 08/27/2021   BUN 11 08/27/2021   GFR 88.22 05/23/2021   GFRNONAA >60 08/27/2021   GFRAA >60 09/30/2019   NA 133 (L) 08/27/2021   K 3.6 08/28/2021   CALCIUM 8.7 (L) 08/27/2021   CO2 30 08/27/2021   GLUCOSE 116 (H) 08/27/2021    Lab Results  Component Value Date/Time   HGBA1C 6.1 (H) 08/09/2021 11:42 AM   HGBA1C 7.1 (H) 05/23/2021 10:35 AM   GFR 88.22 05/23/2021 10:35 AM   GFR 84.38 11/21/2020 10:09 AM    Last diabetic Eye exam:  Lab Results  Component Value Date/Time   HMDIABEYEEXA No Retinopathy 06/08/2020 12:00 AM    Last diabetic Foot exam: No results found for: HMDIABFOOTEX   Lab Results  Component Value Date  CHOL 171 05/23/2021   HDL 54.20 05/23/2021   LDLCALC 95 05/23/2021   TRIG 108.0 05/23/2021   CHOLHDL 3 05/23/2021       Latest Ref Rng & Units 06/11/2021    3:50 AM 05/23/2021   10:35 AM 11/21/2020   10:09 AM  Hepatic Function  Total Protein 6.5 - 8.1 g/dL 8.4   8.0   7.9    Albumin 3.5 - 5.0 g/dL 3.6   3.7   3.8     AST 15 - 41 U/L $Remo'19   21   18    'sSrZS$ ALT 0 - 44 U/L $Remo'19   19   17    'TcbjX$ Alk Phosphatase 38 - 126 U/L 65   61   62    Total Bilirubin 0.3 - 1.2 mg/dL 0.6   0.4   0.4      Lab Results  Component Value Date/Time   TSH 3.29 11/21/2020 10:09 AM   TSH 3.70 05/23/2020 10:34 AM   FREET4 0.73 11/21/2020 10:09 AM       Latest Ref Rng & Units 08/27/2021    8:23 AM 08/25/2021    3:59 AM 08/09/2021   11:17 AM  CBC  WBC 4.0 - 10.5 K/uL 10.7   11.7   4.8    Hemoglobin 12.0 - 15.0 g/dL 11.2   11.9   13.3    Hematocrit 36.0 - 46.0 % 34.1   36.2   41.5    Platelets 150 - 400 K/uL 307   345   330      Lab Results  Component Value Date/Time   VD25OH 43.35 05/23/2020 10:34 AM   VD25OH 35.78 05/20/2016 11:13 AM    Clinical ASCVD: No  The 10-year ASCVD risk score (Arnett DK, et al., 2019) is: 28.6%   Values used to calculate the score:     Age: 88 years     Sex: Female     Is Non-Hispanic African American: Yes     Diabetic: Yes     Tobacco smoker: No     Systolic Blood Pressure: 161 mmHg     Is BP treated: Yes     HDL Cholesterol: 54.2 mg/dL     Total Cholesterol: 171 mg/dL       08/16/2021   11:49 AM 08/15/2020   10:23 AM 11/30/2019   10:10 AM  Depression screen PHQ 2/9  Decreased Interest 0 0 0  Down, Depressed, Hopeless 0 0 0  PHQ - 2 Score 0 0 0     Social History   Tobacco Use  Smoking Status Former   Packs/day: 0.25   Years: 10.00   Pack years: 2.50   Types: Cigarettes  Smokeless Tobacco Never  Tobacco Comments   quit smoking 83yrs ago   BP Readings from Last 3 Encounters:  08/28/21 (!) 142/69  08/16/21 124/78  08/09/21 (!) 154/93   Pulse Readings from Last 3 Encounters:  08/28/21 71  08/16/21 60  08/09/21 (!) 58   Wt Readings from Last 3 Encounters:  08/27/21 279 lb 5.2 oz (126.7 kg)  08/16/21 275 lb 9.6 oz (125 kg)  08/09/21 271 lb (122.9 kg)   BMI Readings from Last 3 Encounters:  08/27/21 47.95 kg/m  08/16/21 47.31 kg/m  08/09/21 46.52 kg/m     Assessment/Interventions: Review of patient past medical history, allergies, medications, health status, including review of consultants reports, laboratory and other test data, was performed as part of comprehensive evaluation and provision of chronic care  management services.   SDOH:  (Social Determinants of Health) assessments and interventions performed: Yes  SDOH Screenings   Alcohol Screen: Low Risk    Last Alcohol Screening Score (AUDIT): 0  Depression (PHQ2-9): Low Risk    PHQ-2 Score: 0  Financial Resource Strain: Low Risk    Difficulty of Paying Living Expenses: Not hard at all  Food Insecurity: No Food Insecurity   Worried About Charity fundraiser in the Last Year: Never true   Ran Out of Food in the Last Year: Never true  Housing: Low Risk    Last Housing Risk Score: 0  Physical Activity: Sufficiently Active   Days of Exercise per Week: 5 days   Minutes of Exercise per Session: 30 min  Social Connections: Engineer, building services of Communication with Friends and Family: More than three times a week   Frequency of Social Gatherings with Friends and Family: More than three times a week   Attends Religious Services: More than 4 times per year   Active Member of [employer] or Organizations: Yes   Attends Music therapist: More than 4 times per year   Marital Status: Married  Stress: No Stress Concern Present   Feeling of Stress : Not at all  Tobacco Use: Medium Risk   Smoking Tobacco Use: Former   Smokeless Tobacco Use: Never   Passive Exposure: Not on Pensions consultant Needs: No Transportation Needs   Lack of Transportation (Medical): No   Lack of Transportation (Non-Medical): No    CCM Care Plan  Allergies  Allergen Reactions   Amlodipine Cough   Metformin And Related Diarrhea   Pravastatin     Joint ache    Medications Reviewed Today     Reviewed by Ernestene Mention, RN (Registered Nurse) on 08/24/21 at 0545  Med List Status:  Complete   Medication Order Taking? Sig Documenting Provider Last Dose Status Informant  Biotin 10 MG TABS [id number] Yes Take 10 mg by mouth daily. [provider] Past Week Active Self  calcium carbonate (CALCIUM 600) 600 MG TABS tablet [id number] Yes Take 1 tablet (600 mg total) by mouth 2 (two) times daily with a meal. Rowe Clack, MD Past Week Active Self  cetirizine (ZYRTEC) 10 MG tablet [id number] Yes Take 10 mg by mouth daily as needed for allergies. [provider] Past Week Active Self  Cholecalciferol (VITAMIN D3) 2000 UNITS TABS [id number] Yes Take 2,000 Units by mouth every morning.  [provider] Past Week Active Self  finasteride (PROSCAR) 5 MG tablet [id number] Yes Take 5 mg by mouth daily. [provider] Past Week Active Self  Fluocinolone Acetonide Scalp 0.01 % OIL [id number] Yes Apply 1 mL topically every other day. [provider] Past Week Active Self  fluticasone (FLONASE) 50 MCG/ACT nasal spray [id number] Yes Place 1 spray into both nostrils daily as needed for allergies or rhinitis.  [provider] Past Week Active Self           Med Note Arneta Cliche Jun 11, 2021 10:49 AM)    losartan-hydrochlorothiazide (HYZAAR) 100-25 MG tablet [id number] Yes TAKE 1 TABLET BY MOUTH EVERY DAY Binnie Rail, MD 08/23/2021 Active Self  Multiple Vitamin (MULTIVITAMIN) tablet [id number] Yes Take 1 tablet by mouth every morning.  [provider] Past Week Active Self  Multiple Vitamins-Minerals (ZINC PO) [id number] Yes Take 1 tablet by mouth daily. [provider] Past Week Active Self  rosuvastatin (  CRESTOR) 5 MG tablet [id number] Yes TAKE 1 TABLET BY MOUTH TWICE WEEKLY Burns, Claudina Lick, MD Past Week Active Self  sennosides-docusate sodium (SENOKOT-S) 8.6-50 MG tablet [id number] Yes Take 1 tablet by mouth at bedtime.  Patient taking differently: Take 2 tablets by mouth at bedtime.   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   Hepatic steatosis 10/08/2019   Plantar fasciitis, right 12/05/2016   Diabetes mellitus without complication ([place]) 41/42/3953   Nodule of flexor tendon sheath 01/23/2016   Midline low back pain without sciatica 01/23/2016   Knee osteoarthritis 12/06/2014   Hyperlipidemia    Osteopenia 12/29/2013   Severe obesity (BMI >= 40) ([place]) 11/16/2012   Multinodular goiter  - stable, s/p bx - no FU needed 11/28/2011   Vitamin D deficiency disease 08/14/2011   Kidney stone 07/03/2011   Hypertension     Immunization History  Administered Date(s) Administered   Fluad Quad(high Dose 65+) 05/08/2021   Influenza, High Dose Seasonal PF 05/15/2015, 05/20/2016, 02/27/2017, 05/26/2018, 03/17/2019, 03/29/2020   Influenza, Seasonal, Injecte, Preservative Fre 08/12/2012   Influenza,inj,Quad PF,6+ Mos 02/11/2013, 05/12/2014   Moderna SARS-COV2 Booster Vaccination 10/12/2020, 05/08/2021   Moderna Sars-Covid-2 Vaccination 07/25/2019, 08/22/2019, 03/29/2020   Pneumococcal Conjugate-13 05/15/2015   Pneumococcal Polysaccharide-23 06/10/2010, 11/19/2016   Zoster Recombinat (Shingrix) 12/03/2018, 02/10/2019   Zoster, Live 10/09/2010    Conditions to be addressed/monitored:  Hypertension, Hyperlipidemia, Diabetes, and Osteopenia  There are no care plans that you recently modified to display for this patient.     Medication Assistance: None required.  Patient affirms current coverage meets needs.  Compliance/Adherence/Medication fill history: Care Gaps: COVID booster DEXA scan  Eye exam   Patient's preferred pharmacy is:  [id number] -  [place], [address] [place] [address] Phone: [phone] Fax: [phone]   Uses pill box? No - prefers bottles Pt endorses 100% compliance  Care Plan and Follow Up Patient Decision:  Patient agrees to Care Plan and Follow-up.  Plan: Telephone follow up appointment with care management team member scheduled for:  9 months  ***

## 2021-11-25 ENCOUNTER — Encounter: Payer: Self-pay | Admitting: Internal Medicine

## 2021-11-25 NOTE — Patient Instructions (Addendum)
     Blood work was ordered.     Medications changes include :   None   Your prescription(s) have been sent to your pharmacy.       Return in about 6 months (around 05/29/2022) for follow up, Schedule DEXA-[place].

## 2021-11-25 NOTE — Progress Notes (Unsigned)
Subjective:    Patient ID: Frances Gray, female    DOB: [birth year], 71 y.o.   MRN: [id number]     HPI Frances Gray is here for follow up of her chronic medical problems, including DM, htn, hld, goiter  Earlier this year had diverticulitis with abscess - S/p rectosigmoid resection, left salpingo-oopherectomy  Walking some.  Eating pretty well.  Medications and allergies reviewed with patient and updated if appropriate.  Current Outpatient Medications on File Prior to Visit  Medication Sig Dispense Refill   Biotin 10 MG TABS Take 10 mg by mouth daily.     calcium carbonate (CALCIUM 600) 600 MG TABS tablet Take 1 tablet (600 mg total) by mouth 2 (two) times daily with a meal. 60 tablet 6   cetirizine (ZYRTEC) 10 MG tablet Take 10 mg by mouth daily as needed for allergies.     Cholecalciferol (VITAMIN D3) 2000 UNITS TABS Take 2,000 Units by mouth every morning.      finasteride (PROSCAR) 5 MG tablet Take 5 mg by mouth daily.     Fluocinolone Acetonide Scalp 0.01 % OIL Apply 1 mL topically every other day.     fluticasone (FLONASE) 50 MCG/ACT nasal spray Place 1 spray into both nostrils daily as needed for allergies or rhinitis.   0   losartan-hydrochlorothiazide (HYZAAR) 100-25 MG tablet TAKE 1 TABLET BY MOUTH EVERY DAY 90 tablet 1   Multiple Vitamin (MULTIVITAMIN) tablet Take 1 tablet by mouth every morning.      Multiple Vitamins-Minerals (ZINC PO) Take 1 tablet by mouth daily.     rosuvastatin (CRESTOR) 5 MG tablet TAKE 1 TABLET BY MOUTH TWICE WEEKLY 24 tablet 1   sennosides-docusate sodium (SENOKOT-S) 8.6-50 MG tablet Take 1 tablet by mouth at bedtime. (Patient taking differently: Take 2 tablets by mouth at bedtime.)     clobetasol (OLUX) 0.05 % topical foam Apply topically every other day.     No current facility-administered medications on file prior to visit.     Review of Systems  Constitutional:  Negative for chills and fever.  Respiratory:  Negative for cough,  shortness of breath and wheezing.   Cardiovascular:  Negative for chest pain, palpitations and leg swelling.  Gastrointestinal:  Positive for constipation (well controlled). Negative for abdominal pain, blood in stool, diarrhea and nausea.  Neurological:  Positive for light-headedness (occ). Negative for headaches.       Objective:   Vitals:   11/27/21 0951  BP: 134/80  Pulse: (!) 55  Temp: (!) 97.5 F (36.4 C)  SpO2: 97%   BP Readings from Last 3 Encounters:  11/27/21 134/80  08/28/21 (!) 142/69  08/16/21 124/78   Wt Readings from Last 3 Encounters:  11/27/21 270 lb (122.5 kg)  08/27/21 279 lb 5.2 oz (126.7 kg)  08/16/21 275 lb 9.6 oz (125 kg)   Body mass index is 46.35 kg/m.    Physical Exam Constitutional:      General: She is not in acute distress.    Appearance: Normal appearance.  HENT:     Head: Normocephalic and atraumatic.  Eyes:     Conjunctiva/sclera: Conjunctivae normal.  Cardiovascular:     Rate and Rhythm: Normal rate and regular rhythm.     Heart sounds: Normal heart sounds. No murmur heard. Pulmonary:     Effort: Pulmonary effort is normal. No respiratory distress.     Breath sounds: Normal breath sounds. No wheezing.  Musculoskeletal:     Cervical back: Neck supple.  Right lower leg: No edema.     Left lower leg: No edema.  Lymphadenopathy:     Cervical: No cervical adenopathy.  Skin:    General: Skin is warm and dry.     Findings: No rash.  Neurological:     Mental Status: She is alert. Mental status is at baseline.  Psychiatric:        Mood and Affect: Mood normal.        Behavior: Behavior normal.        Lab Results  Component Value Date   WBC 10.7 (H) 08/27/2021   HGB 11.2 (L) 08/27/2021   HCT 34.1 (L) 08/27/2021   PLT 307 08/27/2021   GLUCOSE 116 (H) 08/27/2021   CHOL 171 05/23/2021   TRIG 108.0 05/23/2021   HDL 54.20 05/23/2021   LDLCALC 95 05/23/2021   ALT 19 06/11/2021   AST 19 06/11/2021   NA 133 (L) 08/27/2021    K 3.6 08/28/2021   CL 98 08/27/2021   CREATININE 0.72 08/27/2021   BUN 11 08/27/2021   CO2 30 08/27/2021   TSH 3.29 11/21/2020   INR 1.2 06/12/2021   HGBA1C 6.1 (H) 08/09/2021     Assessment & Plan:    See Problem List for Assessment and Plan of chronic medical problems.

## 2021-11-26 ENCOUNTER — Ambulatory Visit: Payer: Medicare Other

## 2021-11-26 ENCOUNTER — Ambulatory Visit (INDEPENDENT_AMBULATORY_CARE_PROVIDER_SITE_OTHER): Payer: Medicare Other | Admitting: Podiatry

## 2021-11-26 DIAGNOSIS — M79674 Pain in right toe(s): Secondary | ICD-10-CM

## 2021-11-26 DIAGNOSIS — M205X1 Other deformities of toe(s) (acquired), right foot: Secondary | ICD-10-CM

## 2021-11-26 DIAGNOSIS — L600 Ingrowing nail: Secondary | ICD-10-CM | POA: Diagnosis not present

## 2021-11-26 NOTE — Patient Instructions (Signed)

## 2021-11-27 ENCOUNTER — Ambulatory Visit (INDEPENDENT_AMBULATORY_CARE_PROVIDER_SITE_OTHER): Payer: Medicare Other | Admitting: Internal Medicine

## 2021-11-27 VITALS — BP 134/80 | HR 55 | Temp 97.5°F | Ht 64.0 in | Wt 270.0 lb

## 2021-11-27 DIAGNOSIS — I10 Essential (primary) hypertension: Secondary | ICD-10-CM | POA: Diagnosis not present

## 2021-11-27 DIAGNOSIS — E119 Type 2 diabetes mellitus without complications: Secondary | ICD-10-CM

## 2021-11-27 DIAGNOSIS — E7849 Other hyperlipidemia: Secondary | ICD-10-CM | POA: Diagnosis not present

## 2021-11-27 DIAGNOSIS — E042 Nontoxic multinodular goiter: Secondary | ICD-10-CM

## 2021-11-27 DIAGNOSIS — Z6841 Body Mass Index (BMI) 40.0 and over, adult: Secondary | ICD-10-CM | POA: Diagnosis not present

## 2021-11-27 LAB — COMPREHENSIVE METABOLIC PANEL
ALT: 22 U/L (ref 0–35)
AST: 23 U/L (ref 0–37)
Albumin: 3.9 g/dL (ref 3.5–5.2)
Alkaline Phosphatase: 75 U/L (ref 39–117)
BUN: 11 mg/dL (ref 6–23)
CO2: 31 mEq/L (ref 19–32)
Calcium: 10.4 mg/dL (ref 8.4–10.5)
Chloride: 99 mEq/L (ref 96–112)
Creatinine, Ser: 0.74 mg/dL (ref 0.40–1.20)
GFR: 81.07 mL/min (ref >60.00)
Glucose, Bld: 106 mg/dL — ABNORMAL HIGH (ref 70–99)
Potassium: 4.1 mEq/L (ref 3.5–5.1)
Sodium: 137 mEq/L (ref 135–145)
Total Bilirubin: 0.6 mg/dL (ref 0.2–1.2)
Total Protein: 8.2 g/dL (ref 6.0–8.3)

## 2021-11-27 LAB — MICROALBUMIN / CREATININE URINE RATIO
Creatinine,U: 52.1 mg/dL
Microalb Creat Ratio: 1.3 mg/g (ref 0.0–30.0)
Microalb, Ur: <0.7 mg/dL (ref 0.0–1.9)

## 2021-11-27 LAB — HEMOGLOBIN A1C: Hgb A1c MFr Bld: 6.9 % — ABNORMAL HIGH (ref 4.6–6.5)

## 2021-11-27 LAB — LIPID PANEL
Cholesterol: 202 mg/dL — ABNORMAL HIGH (ref 0–200)
HDL: 61.6 mg/dL (ref >39.00)
LDL Cholesterol: 116 mg/dL — ABNORMAL HIGH (ref 0–99)
NonHDL: 140.32
Total CHOL/HDL Ratio: 3
Triglycerides: 122 mg/dL (ref 0.0–149.0)
VLDL: 24.4 mg/dL (ref 0.0–40.0)

## 2021-11-27 LAB — TSH: TSH: 3.58 u[IU]/mL (ref 0.35–5.50)

## 2021-11-27 NOTE — Assessment & Plan Note (Signed)
Chronic Blood pressure well controlled CMP Continue losartan-HCTZ 100-25 mg daily 

## 2021-11-27 NOTE — Assessment & Plan Note (Signed)
Chronic Regular exercise and healthy diet encouraged Check lipid panel  Continue Crestor 5 mg twice weekly 

## 2021-11-27 NOTE — Assessment & Plan Note (Signed)
Chronic She has lost 8 pounds since she was here 6 months ago Encouraged her to continue walking Encouraged decreased portions and healthy diet-low in sugars and carbohydrates

## 2021-11-27 NOTE — Assessment & Plan Note (Signed)
Chronic  Lab Results  Component Value Date   HGBA1C 6.1 (H) 08/09/2021   Sugars well controlled Check A1c, urine microalbumin today Continue lifestyle control Stressed regular exercise, diabetic diet

## 2021-11-27 NOTE — Assessment & Plan Note (Signed)
Chronic Biopsy in the past has been negative Nodules were stable so no follow-up with imaging necessary Check TSH

## 2021-11-28 ENCOUNTER — Other Ambulatory Visit: Payer: Self-pay | Admitting: Internal Medicine

## 2021-11-28 NOTE — Progress Notes (Signed)
Subjective:   Patient ID: Frances Gray, female   DOB: 71 y.o.   MRN: [id number]   HPI 71 year old female presents the office today for concerns of ingrown toenail right fourth toe which are about 3 months ago.  She points to the lateral aspect where she gets discomfort area is sore to the tip of the toenail.  She tried cutting the nail as well as vitamin E oil without significant improvement.  She is not sure if the toenail or if the toe itself has curved.  No swelling redness or any drainage or pus.   Review of Systems  All other systems reviewed and are negative.  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, R knee s/p TKR 6/16   Diverticulitis    GERD (gastroesophageal reflux disease)    doesn't take any meds   Heart murmur    no symptoms   History of bronchitis winter of 2014   History of colon polyps    benign   History of kidney stones    Hyperlipidemia    Hypertension    takes Hyzaar daily   Perforation of sigmoid colon due to diverticulitis 09/24/2019   Pneumonia    Pre-diabetes    Vitamin D deficiency disease 08/14/2011   takes Vit D daily    Past Surgical History:  Procedure Laterality Date   BREAST BIOPSY Left    Breast ultrasound Right 03/02/13   The 14m complex cyst in the right breast is probably benign. Follow-up in 6 months with bilateral mammogram and (R) ultrasound   Breast Ultrasound Right 09/02/13   There is a benign 3 mm complex cyst in the (R) breast at 9 o'clock   [facility]  tumor removed   (benign)   KNEE ARTHROSCOPY     bilateral   LITHOTRIPSY  08/2011   R ureteral stone   POLYPECTOMY     PROCTOSCOPY N/A 08/24/2021   Procedure: RIGID PROCTOSCOPY;  Surgeon: GMichael Boston MD;  Location: [facility];  Service: General;  Laterality: N/A;   TOTAL KNEE ARTHROPLASTY Left 11/16/2012    Procedure: TOTAL KNEE ARTHROPLASTY- left;  Surgeon: JJohnny Bridge MD;  [place] [facility]  Service: Orthopedics;  Laterality: Left;   TOTAL KNEE ARTHROPLASTY Right 12/06/2014   Procedure: TOTAL KNEE ARTHROPLASTY;  Surgeon: JMarchia Bond MD;  Location: [facility]  Service: Orthopedics;  Laterality: Right;     Current Outpatient Medications:    Biotin 10 MG TABS, Take 10 mg by mouth daily., Disp: , Rfl:    calcium carbonate (CALCIUM 600) 600 MG TABS tablet, Take 1 tablet (600 mg total) by mouth 2 (two) times daily with a meal., Disp: 60 tablet, Rfl: 6   cetirizine (ZYRTEC) 10 MG tablet, Take 10 mg by mouth daily as needed for allergies., Disp: , Rfl:    Cholecalciferol (VITAMIN D3) 2000 UNITS TABS, Take 2,000 Units by mouth every morning. , Disp: , Rfl:    clobetasol (OLUX) 0.05 % topical foam, Apply topically every other day., Disp: , Rfl:    finasteride (PROSCAR) 5 MG tablet, Take 5 mg by mouth daily., Disp: , Rfl:    Fluocinolone Acetonide Scalp 0.01 % OIL, Apply 1 mL topically every other day., Disp: , Rfl:  fluticasone (FLONASE) 50 MCG/ACT nasal spray, Place 1 spray into both nostrils daily as needed for allergies or rhinitis. , Disp: , Rfl: 0   losartan-hydrochlorothiazide (HYZAAR) 100-25 MG tablet, TAKE 1 TABLET BY MOUTH EVERY DAY, Disp: 90 tablet, Rfl: 1   Multiple Vitamin (MULTIVITAMIN) tablet, Take 1 tablet by mouth every morning. , Disp: , Rfl:    Multiple Vitamins-Minerals (ZINC PO), Take 1 tablet by mouth daily., Disp: , Rfl:    rosuvastatin (CRESTOR) 5 MG tablet, TAKE 1 TABLET BY MOUTH TWICE WEEKLY, Disp: 24 tablet, Rfl: 1   sennosides-docusate sodium (SENOKOT-S) 8.6-50 MG tablet, Take 1 tablet by mouth at bedtime. (Patient taking differently: Take 2 tablets by mouth at bedtime.), Disp: , Rfl:   Allergies  Allergen Reactions   Amlodipine Cough   Metformin And Related Diarrhea   Pravastatin     Joint ache           Objective:  Physical Exam  General: AAO x3,  NAD  Dermatological: Incurvation present to the lateral aspect right fourth toenail with localized edema.  There is some dried blood present there is no purulence.  No drainage or pus.  No ascending cellulitis.  No open lesions.  Vascular: Dorsalis Pedis artery and Posterior Tibial artery pedal pulses are 2/4 bilateral with immedate capillary fill time.  There is no pain with calf compression, swelling, warmth, erythema.   Neruologic: Grossly intact via light touch bilateral.  Musculoskeletal: Adductovarus present of fourth toe.  Tenderness along lateral nail border.  No other area discomfort.  Muscular strength 5/5 in all groups tested bilateral.  Gait: Unassisted, Nonantalgic.       Assessment:   Ingrown toenail right lateral fourth digit; adductovarus toe     Plan:  -Treatment options discussed including all alternatives, risks, and complications -Etiology of symptoms were discussed -At this time, the patient is requesting partial nail removal with chemical matricectomy to the symptomatic portion of the nail. Risks and complications were discussed with the patient for which they understand and written consent was obtained. Under sterile conditions a total of 3 mL of a mixture of 2% lidocaine plain and 0.5% Marcaine plain was infiltrated in a digital block fashion. Once anesthetized, the skin was prepped in sterile fashion. A tourniquet was then applied. Next the lateral aspect of fourth digit nail border was then sharply excised making sure to remove the entire offending nail border. Once the nails were ensured to be removed area was debrided and the underlying skin was intact. There is no purulence identified in the procedure. Next phenol was then applied under standard conditions and copiously irrigated. Silvadene was applied. A dry sterile dressing was applied. After application of the dressing the tourniquet was removed and there is found to be an immediate capillary refill time to the  digit. The patient tolerated the procedure well any complications. Post procedure instructions were discussed the patient for which he verbally understood. Discussed signs/symptoms of infection and directed to call the office immediately should any occur or go directly to the emergency room. In the meantime, encouraged to call the office with any questions, concerns, changes symptoms. -Offloading pads for the toe itself.  Return in about 2 weeks (around 12/10/2021) for nail check .  Trula Slade DPM

## 2021-11-29 DIAGNOSIS — Z1231 Encounter for screening mammogram for malignant neoplasm of breast: Secondary | ICD-10-CM | POA: Diagnosis not present

## 2021-11-30 LAB — HM MAMMOGRAPHY

## 2021-12-12 ENCOUNTER — Ambulatory Visit (INDEPENDENT_AMBULATORY_CARE_PROVIDER_SITE_OTHER): Payer: Medicare Other

## 2021-12-12 DIAGNOSIS — L600 Ingrowing nail: Secondary | ICD-10-CM

## 2021-12-12 NOTE — Progress Notes (Signed)
Patient in office today for a right 4th digit nail check.   Patient denies nausea, vomiting, fever, drainage and chills.    Right 4th digit lateral border looks to be free of infection. Patient states she continues to use triple antibiotic ointment daily at this time. No concerns voiced by patient.   Advised patient to keep nail bed clean, dry and open to air. Also to continue to monitor for signs and symptoms of infection. Advised patient to call the office with any questions, comments, or concerns. Patient verbalized understanding.

## 2021-12-14 ENCOUNTER — Encounter: Payer: Self-pay | Admitting: Internal Medicine

## 2021-12-14 NOTE — Progress Notes (Signed)
Outside notes received. Information abstracted. Notes sent to scan.  

## 2022-01-02 DIAGNOSIS — H2513 Age-related nuclear cataract, bilateral: Secondary | ICD-10-CM | POA: Diagnosis not present

## 2022-01-02 DIAGNOSIS — E119 Type 2 diabetes mellitus without complications: Secondary | ICD-10-CM | POA: Diagnosis not present

## 2022-01-02 DIAGNOSIS — H5203 Hypermetropia, bilateral: Secondary | ICD-10-CM | POA: Diagnosis not present

## 2022-01-02 DIAGNOSIS — H524 Presbyopia: Secondary | ICD-10-CM | POA: Diagnosis not present

## 2022-01-02 LAB — HM DIABETES EYE EXAM

## 2022-01-24 ENCOUNTER — Encounter: Payer: Self-pay | Admitting: Internal Medicine

## 2022-01-24 NOTE — Progress Notes (Signed)
Outside notes received. Information abstracted. Notes sent to scan.  

## 2022-03-03 ENCOUNTER — Encounter: Payer: Self-pay | Admitting: Internal Medicine

## 2022-03-03 NOTE — Progress Notes (Signed)
Subjective:    Patient ID: Frances Gray, female    DOB: 1951/05/11, 71 y.o.   MRN: [id number]      HPI Frances Gray is here for No chief complaint on file.    Spasms in lower leg x 4 weeks - she has spasms in both legs but her right leg is worse.  Two weeks ago she went up to [place] for a wedding and the spasms were worse in the right leg.   At that time she did not have it in the left leg.  The pain is in the anterior-medial lower leg.  It comes duing the day and night.   It is sporadic - it is not every day/ night.    The spasms have gotten better at this time.  She was unsure if she should see orthopedic-maybe it was coming from her knees or if she should come here.  She does have some chronic mild back pain.  Medications and allergies reviewed with patient and updated if appropriate.  Current Outpatient Medications on File Prior to Visit  Medication Sig Dispense Refill   Biotin 10 MG TABS Take 10 mg by mouth daily.     calcium carbonate (CALCIUM 600) 600 MG TABS tablet Take 1 tablet (600 mg total) by mouth 2 (two) times daily with a meal. 60 tablet 6   cetirizine (ZYRTEC) 10 MG tablet Take 10 mg by mouth daily as needed for allergies.     Cholecalciferol (VITAMIN D3) 2000 UNITS TABS Take 2,000 Units by mouth every morning.      clobetasol (OLUX) 0.05 % topical foam Apply topically every other day.     finasteride (PROSCAR) 5 MG tablet Take 5 mg by mouth daily.     Fluocinolone Acetonide Scalp 0.01 % OIL Apply 1 mL topically every other day.     fluticasone (FLONASE) 50 MCG/ACT nasal spray Place 1 spray into both nostrils daily as needed for allergies or rhinitis.   0   losartan-hydrochlorothiazide (HYZAAR) 100-25 MG tablet TAKE 1 TABLET BY MOUTH EVERY DAY 90 tablet 1   Multiple Vitamin (MULTIVITAMIN) tablet Take 1 tablet by mouth every morning.      Multiple Vitamins-Minerals (ZINC PO) Take 1 tablet by mouth daily.     rosuvastatin (CRESTOR) 5 MG tablet TAKE 1 TABLET BY  MOUTH TWICE WEEKLY 24 tablet 1   sennosides-docusate sodium (SENOKOT-S) 8.6-50 MG tablet Take 1 tablet by mouth at bedtime. (Patient taking differently: Take 2 tablets by mouth at bedtime.)     No current facility-administered medications on file prior to visit.    Review of Systems  Musculoskeletal:  Positive for back pain (chronic).       Cramping - pain in right lower anterior-medial leg  Neurological:  Negative for weakness and numbness.       Objective:   Vitals:   03/04/22 1032  BP: 128/76  Pulse: (!) 53  Temp: 97.6 F (36.4 C)  SpO2: 100%   BP Readings from Last 3 Encounters:  03/04/22 128/76  11/27/21 134/80  08/28/21 (!) 142/69   Wt Readings from Last 3 Encounters:  03/04/22 273 lb (123.8 kg)  11/27/21 270 lb (122.5 kg)  08/27/21 279 lb 5.2 oz (126.7 kg)   Body mass index is 46.86 kg/m.    Physical Exam Constitutional:      General: She is not in acute distress.    Appearance: Normal appearance. She is not ill-appearing.  HENT:     Head: Normocephalic and  atraumatic.  Musculoskeletal:        General: No swelling, tenderness (No lumbar spine tenderness, no right lower leg tenderness with palpation) or deformity.     Right lower leg: No edema.     Left lower leg: No edema.  Skin:    General: Skin is warm and dry.     Findings: No erythema or rash.  Neurological:     Mental Status: She is alert.     Sensory: No sensory deficit.     Motor: No weakness.            Assessment & Plan:    See Problem List for Assessment and Plan of chronic medical problems.

## 2022-03-04 ENCOUNTER — Encounter: Payer: Self-pay | Admitting: Internal Medicine

## 2022-03-04 ENCOUNTER — Ambulatory Visit (INDEPENDENT_AMBULATORY_CARE_PROVIDER_SITE_OTHER): Payer: Medicare Other | Admitting: Internal Medicine

## 2022-03-04 VITALS — BP 128/76 | HR 53 | Temp 97.6°F | Ht 64.0 in | Wt 273.0 lb

## 2022-03-04 DIAGNOSIS — M79661 Pain in right lower leg: Secondary | ICD-10-CM | POA: Diagnosis not present

## 2022-03-04 DIAGNOSIS — Z23 Encounter for immunization: Secondary | ICD-10-CM

## 2022-03-04 DIAGNOSIS — I10 Essential (primary) hypertension: Secondary | ICD-10-CM

## 2022-03-04 NOTE — Assessment & Plan Note (Signed)
Acute Has had intermittent pain in her right lower leg over the past 4 weeks-occasionally in the left lower leg, but not to the same degree Area of pain is in the anterior-medial part of the lower leg just above the ankle and feels more like a cramping like pain Can occur during the day and night Likely lumbar radiculopathy Her symptoms have improved and at this point she does not feel like she needs treatment Discussed that she can see her orthopedic now-Dr. Mardelle Matte we will wait until the symptoms get worse again and she will likely wait Discussed physical therapy, gabapentin-for now she will just monitor

## 2022-03-04 NOTE — Patient Instructions (Addendum)
      Your leg pain is likely coming from your back.  If the pain returns I would see Dr Mardelle Matte.       Medications changes include :   none

## 2022-03-04 NOTE — Assessment & Plan Note (Signed)
Chronic Blood pressure well controlled Continue losartan-HCT 100-25 mg daily

## 2022-03-05 NOTE — Addendum Note (Signed)
Addended by: Marcina Millard on: 03/05/2022 04:35 PM   Modules accepted: Orders

## 2022-03-16 ENCOUNTER — Other Ambulatory Visit: Payer: Self-pay | Admitting: Internal Medicine

## 2022-05-10 DIAGNOSIS — M545 Low back pain, unspecified: Secondary | ICD-10-CM | POA: Diagnosis not present

## 2022-05-13 DIAGNOSIS — Z23 Encounter for immunization: Secondary | ICD-10-CM | POA: Diagnosis not present

## 2022-05-21 DIAGNOSIS — M4316 Spondylolisthesis, lumbar region: Secondary | ICD-10-CM | POA: Diagnosis not present

## 2022-05-21 DIAGNOSIS — M6281 Muscle weakness (generalized): Secondary | ICD-10-CM | POA: Diagnosis not present

## 2022-05-24 ENCOUNTER — Other Ambulatory Visit: Payer: Self-pay | Admitting: Internal Medicine

## 2022-05-26 NOTE — Patient Instructions (Addendum)
      Blood work was ordered.   The lab is on the first floor.    Medications changes include :   none      Return in about 6 months (around 11/28/2022) for follow up, Schedule DEXA-[facility].

## 2022-05-26 NOTE — Progress Notes (Unsigned)
Subjective:    Patient ID: Frances Gray, female    DOB: [birth year], 71 y.o.   MRN: [id number]     HPI Frances Gray is here for follow up of her chronic medical problems, including htn, DM, hld, goiter, obesity  She is taking all of her medications as prescribed.    Had vaginal bleeding earlier this month - saw gyn.  Biopsy done - awaiting results.    Pt for back for Dr Thea Alken - had 2 injections    Medications and allergies reviewed with patient and updated if appropriate.  Current Outpatient Medications on File Prior to Visit  Medication Sig Dispense Refill   Biotin 10 MG TABS Take 10 mg by mouth daily.     calcium carbonate (CALCIUM 600) 600 MG TABS tablet Take 1 tablet (600 mg total) by mouth 2 (two) times daily with a meal. 60 tablet 6   cetirizine (ZYRTEC) 10 MG tablet Take 10 mg by mouth as needed for allergies.     Cholecalciferol (VITAMIN D3) 2000 UNITS TABS Take 2,000 Units by mouth every morning.      clobetasol (OLUX) 0.05 % topical foam Apply topically every other day.     finasteride (PROSCAR) 5 MG tablet Take 5 mg by mouth daily.     fluticasone (FLONASE) 50 MCG/ACT nasal spray Place 1 spray into both nostrils daily as needed for allergies or rhinitis.   0   losartan-hydrochlorothiazide (HYZAAR) 100-25 MG tablet TAKE 1 TABLET BY MOUTH EVERY DAY 90 tablet 1   Multiple Vitamin (MULTIVITAMIN) tablet Take 1 tablet by mouth every morning.      Multiple Vitamins-Minerals (ZINC PO) Take 1 tablet by mouth daily.     rosuvastatin (CRESTOR) 5 MG tablet TAKE 1 TABLET BY MOUTH TWICE WEEKLY 24 tablet 1   sennosides-docusate sodium (SENOKOT-S) 8.6-50 MG tablet Take 1 tablet by mouth at bedtime. (Patient taking differently: Take 2 tablets by mouth at bedtime.)     No current facility-administered medications on file prior to visit.     Review of Systems  Constitutional:  Negative for fever.  Respiratory:  Negative for cough, shortness of breath and wheezing.    Cardiovascular:  Negative for chest pain, palpitations and leg swelling.  Neurological:  Negative for light-headedness and headaches.       Objective:   Vitals:   05/29/22 0949  BP: 124/78  Pulse: (!) 55  Temp: 98 F (36.7 C)  SpO2: 99%   BP Readings from Last 3 Encounters:  05/29/22 124/78  03/04/22 128/76  11/27/21 134/80   Wt Readings from Last 3 Encounters:  05/29/22 265 lb (120.2 kg)  03/04/22 273 lb (123.8 kg)  11/27/21 270 lb (122.5 kg)   Body mass index is 45.49 kg/m.    Physical Exam Constitutional:      General: She is not in acute distress.    Appearance: Normal appearance.  HENT:     Head: Normocephalic and atraumatic.  Eyes:     Conjunctiva/sclera: Conjunctivae normal.  Cardiovascular:     Rate and Rhythm: Normal rate and regular rhythm.     Heart sounds: Murmur (1/6 sys) heard.  Pulmonary:     Effort: Pulmonary effort is normal. No respiratory distress.     Breath sounds: Normal breath sounds. No wheezing.  Musculoskeletal:     Cervical back: Neck supple.     Right lower leg: No edema.     Left lower leg: No edema.  Lymphadenopathy:  Cervical: No cervical adenopathy.  Skin:    General: Skin is warm and dry.     Findings: No rash.  Neurological:     Mental Status: She is alert. Mental status is at baseline.  Psychiatric:        Mood and Affect: Mood normal.        Behavior: Behavior normal.        Lab Results  Component Value Date   WBC 10.7 (H) 08/27/2021   HGB 11.2 (L) 08/27/2021   HCT 34.1 (L) 08/27/2021   PLT 307 08/27/2021   GLUCOSE 106 (H) 11/27/2021   CHOL 202 (H) 11/27/2021   TRIG 122.0 11/27/2021   HDL 61.60 11/27/2021   LDLCALC 116 (H) 11/27/2021   ALT 22 11/27/2021   AST 23 11/27/2021   NA 137 11/27/2021   K 4.1 11/27/2021   CL 99 11/27/2021   CREATININE 0.74 11/27/2021   BUN 11 11/27/2021   CO2 31 11/27/2021   TSH 3.58 11/27/2021   INR 1.2 06/12/2021   HGBA1C 6.9 (H) 11/27/2021   MICROALBUR <0.7  11/27/2021     Assessment & Plan:    See Problem List for Assessment and Plan of chronic medical problems.

## 2022-05-27 DIAGNOSIS — N950 Postmenopausal bleeding: Secondary | ICD-10-CM | POA: Diagnosis not present

## 2022-05-27 DIAGNOSIS — R9389 Abnormal findings on diagnostic imaging of other specified body structures: Secondary | ICD-10-CM | POA: Diagnosis not present

## 2022-05-28 DIAGNOSIS — M6281 Muscle weakness (generalized): Secondary | ICD-10-CM | POA: Diagnosis not present

## 2022-05-28 DIAGNOSIS — M4316 Spondylolisthesis, lumbar region: Secondary | ICD-10-CM | POA: Diagnosis not present

## 2022-05-29 ENCOUNTER — Encounter: Payer: Self-pay | Admitting: Internal Medicine

## 2022-05-29 ENCOUNTER — Ambulatory Visit (INDEPENDENT_AMBULATORY_CARE_PROVIDER_SITE_OTHER): Payer: Medicare Other | Admitting: Internal Medicine

## 2022-05-29 VITALS — BP 124/78 | HR 55 | Temp 98.0°F | Ht 64.0 in | Wt 265.0 lb

## 2022-05-29 DIAGNOSIS — E2839 Other primary ovarian failure: Secondary | ICD-10-CM

## 2022-05-29 DIAGNOSIS — E042 Nontoxic multinodular goiter: Secondary | ICD-10-CM | POA: Diagnosis not present

## 2022-05-29 DIAGNOSIS — I10 Essential (primary) hypertension: Secondary | ICD-10-CM | POA: Diagnosis not present

## 2022-05-29 DIAGNOSIS — E559 Vitamin D deficiency, unspecified: Secondary | ICD-10-CM | POA: Diagnosis not present

## 2022-05-29 DIAGNOSIS — M85859 Other specified disorders of bone density and structure, unspecified thigh: Secondary | ICD-10-CM

## 2022-05-29 DIAGNOSIS — E7849 Other hyperlipidemia: Secondary | ICD-10-CM | POA: Diagnosis not present

## 2022-05-29 DIAGNOSIS — E119 Type 2 diabetes mellitus without complications: Secondary | ICD-10-CM

## 2022-05-29 LAB — COMPREHENSIVE METABOLIC PANEL
ALT: 23 U/L (ref 0–35)
AST: 19 U/L (ref 0–37)
Albumin: 3.9 g/dL (ref 3.5–5.2)
Alkaline Phosphatase: 75 U/L (ref 39–117)
BUN: 13 mg/dL (ref 6–23)
CO2: 33 mEq/L — ABNORMAL HIGH (ref 19–32)
Calcium: 10.3 mg/dL (ref 8.4–10.5)
Chloride: 99 mEq/L (ref 96–112)
Creatinine, Ser: 0.78 mg/dL (ref 0.40–1.20)
GFR: 75.84 mL/min (ref >60.00)
Glucose, Bld: 108 mg/dL — ABNORMAL HIGH (ref 70–99)
Potassium: 4 mEq/L (ref 3.5–5.1)
Sodium: 138 mEq/L (ref 135–145)
Total Bilirubin: 0.4 mg/dL (ref 0.2–1.2)
Total Protein: 8 g/dL (ref 6.0–8.3)

## 2022-05-29 LAB — VITAMIN D 25 HYDROXY (VIT D DEFICIENCY, FRACTURES): VITD: 39.06 ng/mL (ref 30.00–100.00)

## 2022-05-29 LAB — LIPID PANEL
Cholesterol: 205 mg/dL — ABNORMAL HIGH (ref 0–200)
HDL: 63.2 mg/dL (ref >39.00)
LDL Cholesterol: 122 mg/dL — ABNORMAL HIGH (ref 0–99)
NonHDL: 141.82
Total CHOL/HDL Ratio: 3
Triglycerides: 101 mg/dL (ref 0.0–149.0)
VLDL: 20.2 mg/dL (ref 0.0–40.0)

## 2022-05-29 LAB — TSH: TSH: 2.29 u[IU]/mL (ref 0.35–5.50)

## 2022-05-29 LAB — HEMOGLOBIN A1C: Hgb A1c MFr Bld: 7.2 % — ABNORMAL HIGH (ref 4.6–6.5)

## 2022-05-29 NOTE — Assessment & Plan Note (Signed)
Chronic   Lab Results  Component Value Date   HGBA1C 6.9 (H) 11/27/2021   Sugars controlled Check A1c Continue lifestyle control-discussed if A1c goes up we do need medication.  Discussed some options even if A1c is controlled Stressed regular exercise, diabetic diet

## 2022-05-29 NOTE — Assessment & Plan Note (Signed)
Chronic Stable No follow-up needed TSH

## 2022-05-29 NOTE — Assessment & Plan Note (Signed)
Chronic Taking vitamin D daily Check vitamin D level  

## 2022-05-29 NOTE — Assessment & Plan Note (Signed)
Chronic Regular exercise and healthy diet encouraged Check lipid panel, CMP Continue Crestor 5 mg twice weekly

## 2022-05-29 NOTE — Assessment & Plan Note (Signed)
Chronic Blood pressure well controlled CMP Continue losartan-HCTZ 100-25 mg daily 

## 2022-05-29 NOTE — Assessment & Plan Note (Signed)
Chronic Stressed weight loss Discussed diabetic medications that may help with weight loss Discussed nutrition referral Stressed regular exercise Stressed healthy diet low in sugars/carbs and small portions

## 2022-05-29 NOTE — Assessment & Plan Note (Signed)
Chronic Dexa due - ordered Continue taking calcium and vitamin d  Not exercising

## 2022-05-30 DIAGNOSIS — L661 Lichen planopilaris: Secondary | ICD-10-CM | POA: Diagnosis not present

## 2022-05-30 DIAGNOSIS — L668 Other cicatricial alopecia: Secondary | ICD-10-CM | POA: Diagnosis not present

## 2022-05-31 ENCOUNTER — Encounter: Payer: Self-pay | Admitting: Internal Medicine

## 2022-06-04 DIAGNOSIS — M6281 Muscle weakness (generalized): Secondary | ICD-10-CM | POA: Diagnosis not present

## 2022-06-04 DIAGNOSIS — M4316 Spondylolisthesis, lumbar region: Secondary | ICD-10-CM | POA: Diagnosis not present

## 2022-06-05 MED ORDER — RYBELSUS 3 MG PO TABS: 3.0000 mg | ORAL_TABLET | Freq: Every day | ORAL | 0 refills | Status: DC

## 2022-06-07 ENCOUNTER — Telehealth: Payer: Self-pay

## 2022-06-07 DIAGNOSIS — M4316 Spondylolisthesis, lumbar region: Secondary | ICD-10-CM | POA: Diagnosis not present

## 2022-06-07 NOTE — Telephone Encounter (Signed)
PA for Rybelsus has been denied on 06/06/22.

## 2022-06-19 ENCOUNTER — Inpatient Hospital Stay: Admission: RE | Admit: 2022-06-19 | Payer: Medicare Other | Source: Ambulatory Visit

## 2022-06-20 ENCOUNTER — Ambulatory Visit (INDEPENDENT_AMBULATORY_CARE_PROVIDER_SITE_OTHER)
Admission: RE | Admit: 2022-06-20 | Discharge: 2022-06-20 | Disposition: A | Discharge status: Home / Self Care | Payer: Medicare Other | Source: Ambulatory Visit | Attending: Internal Medicine | Admitting: Internal Medicine

## 2022-06-20 DIAGNOSIS — M85859 Other specified disorders of bone density and structure, unspecified thigh: Secondary | ICD-10-CM

## 2022-06-20 DIAGNOSIS — E2839 Other primary ovarian failure: Secondary | ICD-10-CM | POA: Diagnosis not present

## 2022-06-21 ENCOUNTER — Telehealth: Payer: Self-pay | Admitting: *Deleted

## 2022-06-21 NOTE — Telephone Encounter (Signed)
LMOM for the patient to call the office back. Patient needs to be scheduled for a new patient appt with Dr Berline Lopes on 1/22

## 2022-06-21 NOTE — Telephone Encounter (Signed)
Spoke with the patient regarding the referral to GYN oncology. Patient scheduled as new patient with Dr Berline Lopes 2/2 at 10:30 am. Patient given an arrival time of 10 am.  Explained to the patient the the doctor will perform a pelvic exam at this visit. Patient given the policy that no visitors under the 16 yrs are allowed in the [place]. Patient given the address/phone number for the clinic and that the center offers free valet service. Patient aware of the new mask mandate.

## 2022-07-11 ENCOUNTER — Encounter: Payer: Self-pay | Admitting: Gynecologic Oncology

## 2022-07-11 NOTE — H&P (View-Only) (Signed)
GYNECOLOGIC ONCOLOGY NEW PATIENT CONSULTATION   Patient Name: Frances Gray  Patient Age: 72 y.o. 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  We reviewed the diagnosis of endometrial intraepithelial neoplasia (EIN) and the treatment options, including medical management (Mirena IUD or progesterone PO) or hysterectomy.    We reviewed surgical management including total hysterectomy with BSO.  We also reviewed the role of progesterone therapy and the effect on preneoplastic lesions, believed to include induction of apoptosis in addition to tissue sloughing during withdrawal bleeding.  Activation of the progesterone receptors is believed to lead stromal decidualization and thinning of the lining.  We reviewed the 3 most studied options, to include levonorgesterol IUD (67mg/d), oral medorxyprogesterone acetate, or oral megesterol acetate.  I discussed some of the side effects related to oral progesterone including GI side effects, edema, and VTE.  Given that the IUD appears to have similar if not better response rates then oral progesterone, I typically recommend using the Mirena IUD for treatment.  We discussed that treatment with progesterone would require continued follow-up, which would include a biopsy every 3 months initially until we prove regression.   Patient desires to proceed with surgical management.  The patient is a suitable candidate for hysterectomy via a minimally invasive approach to surgery.  Given that she is postmenopausal, a bilateral salpingo-oophorectomy is also recommended.  We reviewed that robotic assistance would be used to complete the surgery.  We discussed that endometrial cancer is detected in about 40% of final uterine pathology specimens from patients with EIN.    I discussed  her complex prior surgical history notably that she had multiple bouts of diverticulitis with fluid collections/abscesses at the time of her rectosigmoid resection last year.  The left adnexa was involved requiring its removal.  It sounds like the posterior uterus was also involved but able to be freed from the surrounding fluid collections.  We discussed that given this history, surgery may require additional colon surgery if the uterus is adherent to the colon.  I offered several options.  I offered that we could start with diagnostic laparoscopy and if findings at the time of laparoscopy revealed that the patient would need extensive surgery including potentially bowel surgery, that we could abort and continue with a D&C and Mirena IUD placement.  We discussed the possibility of D&C and Mirena IUD placement only.  And finally we discussed the plan to proceed with definitive surgery even if this means more extensive surgery including possible bowel resection.  Ultimately, after our discussion, the patient would like to move forward with definitive surgery.  We discussed 2 options at the time of surgery with regard to her EIN.  Given the risk of malignancy found on final hysterectomy specimen approximately 40% of the time, we can plan to send the uterus for intraoperative frozen pathology.  If cancer is identified at the time of surgery, additional procedures including lymph node evaluation, is recommended.  Alternatively, a sentinel lymph node biopsy procedure could be performed.  While this would be over treatment if no malignancy is identified on her final specimen, it would eliminate the need for full lymphadenectomy if the patient met Mayo criteria on frozen section.  After considering the risks and benefits of each option, the patient wanted to move forward with sentinel lymph node biopsy.  She understands that the backup if  mapping is not successful be to send the uterus for frozen section.  We  reviewed the sentinel lymph node technique. Risks and benefits of sentinel lymph node biopsy was reviewed. We reviewed the technique and ICG dye. The patient DOES NOT have an iodine allergy or known liver dysfunction. We reviewed the false negative rate (0.4%), and that 3% of patients with metastatic disease will not have it detected by SLN biopsy in endometrial cancer. A low risk of allergic reaction to the dye, <0.2% for ICG, has been reported. We also discussed that in the case of failed mapping, which occurs 40% of the time, a bilateral or unilateral lymphadenectomy will be performed at the surgeon's discretion.   Potential benefits of sentinel nodes including a higher detection rate for metastasis due to ultrastaging and potential reduction in operative morbidity. However, there remains uncertainty as to the role for treatment of micrometastatic disease. Further, the benefit of operative morbidity associated with the SLN technique in endometrial cancer is not yet completely known. In other patient populations (e.g. the cervical cancer population) there has been observed reductions in morbidity with SLN biopsy compared to pelvic lymphadenectomy. Lymphedema, nerve dysfunction and lymphocysts are all potential risks with the SLN technique as with complete lymphadenectomy. Additional risks to the patient include the risk of damage to an internal organ while operating in an altered view (e.g. the black and white image of the robotic fluorescence imaging mode).   The patient was consented for a robotic assisted hysterectomy, bilateral salpingo-oophorectomy, sentinel lymph node evaluation, possible lymph node dissection, possible laparotomy. The risks of surgery were discussed in detail and she understands these to include infection; wound separation; hernia; vaginal cuff separation, injury to adjacent organs such as bowel, bladder, blood vessels, ureters and nerves; bleeding which may require blood transfusion;  anesthesia risk; thromboembolic events; possible death; unforeseen complications; possible need for re-exploration; medical complications such as heart attack, stroke, pleural effusion and pneumonia; and, if full lymphadenectomy is performed the risk of lymphedema and lymphocyst. The patient will receive DVT and antibiotic prophylaxis as indicated. She voiced a clear understanding. She had the opportunity to ask questions. Perioperative instructions were reviewed with her. Prescriptions for post-op medications were sent to her pharmacy of choice.  Will plan for a bowel prep given her prior surgical history.  Given her family history, I suggested referral to genetics which was placed today.  A copy of this note was sent to the patient's referring provider.   80 minutes of total time was spent for this patient encounter, including preparation, face-to-face counseling with the patient and coordination of care, and documentation of the encounter.  Jeral Pinch, MD  Division of Gynecologic Oncology  Department of Obstetrics and Gynecology  [facility]  ___________________________________________  Chief Complaint: Chief Complaint  Patient presents with   Atypical endometrial hyperplasia    History of Present Illness:  Frances Gray is a 72 y.o. y.o. female who is seen in consultation at the request of Dr. Benjie Karvonen for an evaluation of endometrial intraepithelial neoplasia.  Patient developed postmenopausal bleeding on December 8.  She had daily heavy bleeding like a menses until January 18.  After developing the bleeding, she called her OB/GYN to be seen.  She denies any pelvic pain or cramping associated with the bleeding.  Since January 18, she denies any further bleeding or spotting.  Endometrial biopsy on 05/27/2022 reveals endometrial atypical hyperplasia, fragmented endometrial polyp. Pelvic ultrasound exam performed at [facility] on 05/27/2022 revealed  a  uterus measuring 8.7 x 5.6 x 4.5 cm with a thickened endometrium measuring 9.4 mm.  Right ovary measures 1.6 x 1 x 2.1 cm with a simple appearing cyst.  Left ovary is normal in appearance.  She endorses a good appetite, is working on eating less and losing weight.  She denies any nausea or emesis.  Since her colon surgery last March, she has had more regular bowel function but uses stool softeners and laxatives to help maintain regular bowel movements.  She has some urge urinary incontinence.  Prior history of robotic LAR with LSO and drainage of colo-uterine pelvic abscess in 08/2021.  At the time of that surgery, there were multiple abscesses described in the pelvis including 1 involving the left tube and ovary requiring removal of the structures at the time of her colonic resection.  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, R knee s/p TKR 6/16   Diverticulitis    GERD (gastroesophageal reflux disease)    doesn't take any meds   Heart murmur    no symptoms   History of bronchitis winter of 2014   History of colon polyps    benign   History of kidney stones    Hyperlipidemia    Hypertension    takes Hyzaar daily   Perforation of sigmoid colon due to diverticulitis 09/24/2019   Pneumonia    Pre-diabetes    Vitamin D deficiency disease 08/14/2011   takes Vit D daily     PAST SURGICAL HISTORY:  Past Surgical History:  Procedure Laterality Date   BREAST BIOPSY Left    Breast ultrasound Right 03/02/2013   The 28m complex cyst in the right breast is probably benign. Follow-up in 6 months with bilateral mammogram and (R) ultrasound   Breast Ultrasound Right 09/02/2013   There is a benign 3 mm complex cyst in the (R) breast at 9 o'clock   CPacifica  tumor removed   (benign)   KNEE  ARTHROSCOPY     bilateral   LAPAROSCOPIC LOW ANTERIOR RESECTION     robotic, with Dr. GJohney Mainein 2023   LITHOTRIPSY  08/2011   R ureteral stone   POLYPECTOMY     PROCTOSCOPY N/A 08/24/2021   Procedure: RIGID PROCTOSCOPY;  Surgeon: GMichael Boston MD;  Location: [facility];  Service: General;  Laterality: N/A;   TOTAL KNEE ARTHROPLASTY Left 11/16/2012   Procedure: TOTAL KNEE ARTHROPLASTY- left;  Surgeon: JJohnny Bridge MD;  [facility]  Service: Orthopedics;  Laterality: Left;   TOTAL KNEE ARTHROPLASTY Right 12/06/2014   Procedure: TOTAL KNEE ARTHROPLASTY;  Surgeon: JMarchia Bond MD;  [facility]  Service: Orthopedics;  Laterality: Right;   TUBAL LIGATION      OB/GYN HISTORY:  OB History  Gravida Para Term Preterm AB Living  3 2       2  $ SAB IAB Ectopic Multiple Live Births               # Outcome Date GA Lbr Len/2nd Weight Sex Delivery Anes PTL Lv  3 Gravida           2 Para           1 Para  No LMP recorded. Patient is postmenopausal.  Age at menarche: 7 Age at menopause: 50 Hx of HRT: denies Hx of STDs: denies Last pap: 09/2017 History of abnormal pap smears: denies  SCREENING STUDIES:  Last mammogram: 2023  Last colonoscopy: 2020  MEDICATIONS: Outpatient Encounter Medications as of 07/12/2022  Medication Sig   Biotin 10 MG TABS Take 10 mg by mouth daily.   [START ON 07/24/2022] bisacodyl (DULCOLAX) 5 MG EC tablet Take four tablets at 7 am the day before surgery   calcium carbonate (CALCIUM 600) 600 MG TABS tablet Take 1 tablet (600 mg total) by mouth 2 (two) times daily with a meal.   cetirizine (ZYRTEC) 10 MG tablet Take 10 mg by mouth as needed for allergies.   Cholecalciferol (VITAMIN D3) 2000 UNITS TABS Take 2,000 Units by mouth every morning.    clobetasol (OLUX) 0.05 % topical foam Apply topically every other day.   finasteride (PROSCAR) 5 MG tablet Take 5 mg by mouth daily.   fluticasone (FLONASE) 50 MCG/ACT nasal spray Place 1 spray into  both nostrils daily as needed for allergies or rhinitis.    losartan-hydrochlorothiazide (HYZAAR) 100-25 MG tablet TAKE 1 TABLET BY MOUTH EVERY DAY   medroxyPROGESTERone (PROVERA) 10 MG tablet Take 10 mg by mouth daily.   metroNIDAZOLE (FLAGYL) 500 MG tablet Take two tablets (1000 mg total) at 2 pm, 3pm, and 10 pm the day before surgery   Multiple Vitamin (MULTIVITAMIN) tablet Take 1 tablet by mouth every morning.    Multiple Vitamins-Minerals (ZINC PO) Take 1 tablet by mouth daily.   [START ON 07/24/2022] polyethylene glycol powder (MIRALAX) 17 GM/SCOOP powder Starting at 10:00 am day before surgery, mix the bottle of Miralax with the 64 ounces of Gatorade. Drink the Gatorade/Miralax mixture gradually (8 oz glass every 15 minutes) until gone. You should finish in 4 hours   rosuvastatin (CRESTOR) 5 MG tablet TAKE 1 TABLET BY MOUTH TWICE WEEKLY   Semaglutide (RYBELSUS) 3 MG TABS Take 3 mg by mouth daily.   traMADol (ULTRAM) 50 MG tablet Take 1 tablet (50 mg total) by mouth every 6 (six) hours as needed for severe pain. For AFTER surgery only, do not take and drive   [DISCONTINUED] sennosides-docusate sodium (SENOKOT-S) 8.6-50 MG tablet Take 1 tablet by mouth at bedtime. (Patient taking differently: Take 2 tablets by mouth at bedtime.)   [START ON 07/26/2022] sennosides-docusate sodium (SENOKOT-S) 8.6-50 MG tablet Take 2 tablets by mouth at bedtime. For AFTER surgery, do not take if having diarrhea   No facility-administered encounter medications on file as of 07/12/2022.    ALLERGIES:  Allergies  Allergen Reactions   Amlodipine Cough   Metformin And Related Diarrhea   Pravastatin     Joint ache     FAMILY HISTORY:  Family History  Problem Relation Age of Onset   Ovarian cancer Mother    Colon cancer Sister 58   Breast cancer Sister    Esophageal cancer Neg Hx    Colon polyps Neg Hx    Rectal cancer Neg Hx    Stomach cancer Neg Hx    Endometrial cancer Neg Hx    Pancreatic cancer Neg Hx     Prostate cancer Neg Hx      SOCIAL HISTORY:  Social Connections: Socially Integrated (07/11/2022)   Social Connection and Isolation Panel [NHANES]    Frequency of Communication with Friends and Family: Twice a week    Frequency of Social Gatherings with Friends and Family: Twice a  week    Attends Religious Services: More than 4 times per year    Active Member of Clubs or Organizations: Yes    Attends Archivist Meetings: More than 4 times per year    Marital Status: Married    REVIEW OF SYSTEMS:  + Abdominal pain, urinary frequency, menstrual problems, hot flashes, vaginal bleeding, vaginal discharge, back pain. Denies appetite changes, fevers, chills, fatigue, unexplained weight changes. Denies hearing loss, neck lumps or masses, mouth sores, ringing in ears or voice changes. Denies cough or wheezing.  Denies shortness of breath. Denies chest pain or palpitations. Denies leg swelling. Denies abdominal distention, blood in stools, constipation, diarrhea, nausea, vomiting, or early satiety. Denies pain with intercourse, dysuria, hematuria or incontinence.  Denies joint pain or muscle pain/cramps. Denies itching, rash, or wounds. Denies dizziness, headaches, numbness or seizures. Denies swollen lymph nodes or glands, denies easy bruising or bleeding. Denies anxiety, depression, confusion, or decreased concentration.  Physical Exam:  Vital Signs for this encounter:  Blood pressure (!) 148/78, pulse (!) 49, temperature 97.8 F (36.6 C), resp. rate 18, weight 265 lb 12.8 oz (120.6 kg), SpO2 100 %. Body mass index is 45.62 kg/m. General: Alert, oriented, no acute distress.  HEENT: Normocephalic, atraumatic. Sclera anicteric.  Chest: Clear to auscultation bilaterally. No wheezes, rhonchi, or rales. Cardiovascular: Regular rate and rhythm, no murmurs, rubs, or gallops.  Abdomen: Obese. Normoactive bowel sounds. Soft, nondistended, nontender to palpation. No masses or  hepatosplenomegaly appreciated. No palpable fluid wave. Well healed laparoscopic incision, suprapubic incision. Extremities: Grossly normal range of motion. Warm, well perfused. No edema bilaterally.  Skin: No rashes or lesions.  Lymphatics: No cervical, supraclavicular, or inguinal adenopathy.  GU:  Normal external female genitalia. No lesions. No discharge or bleeding.             Bladder/urethra:  No lesions or masses, well supported bladder             Vagina: Mildly atrophic, no lesions.             Cervix: Normal appearing, no lesions.             Uterus: Small, mobile, no parametrial involvement or nodularity.             Adnexa: No masses appreciated.  Rectal: Rectum does not feel tethered.  LABORATORY AND RADIOLOGIC DATA:  Outside medical records were reviewed to synthesize the above history, along with the history and physical obtained during the visit.   Lab Results  Component Value Date   WBC 10.7 (H) 08/27/2021   HGB 11.2 (L) 08/27/2021   HCT 34.1 (L) 08/27/2021   PLT 307 08/27/2021   GLUCOSE 108 (H) 05/29/2022   CHOL 205 (H) 05/29/2022   TRIG 101.0 05/29/2022   HDL 63.20 05/29/2022   LDLCALC 122 (H) 05/29/2022   ALT 23 05/29/2022   AST 19 05/29/2022   NA 138 05/29/2022   K 4.0 05/29/2022   CL 99 05/29/2022   CREATININE 0.78 05/29/2022   BUN 13 05/29/2022   CO2 33 (H) 05/29/2022   TSH 2.29 05/29/2022   INR 1.2 06/12/2021   HGBA1C 7.2 (H) 05/29/2022   MICROALBUR <0.7 11/27/2021

## 2022-07-11 NOTE — Progress Notes (Signed)
GYNECOLOGIC ONCOLOGY NEW PATIENT CONSULTATION   Patient Name: Frances Gray  Patient Age: 72 y.o. 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  We reviewed the diagnosis of endometrial intraepithelial neoplasia (EIN) and the treatment options, including medical management (Mirena IUD or progesterone PO) or hysterectomy.    We reviewed surgical management including total hysterectomy with BSO.  We also reviewed the role of progesterone therapy and the effect on preneoplastic lesions, believed to include induction of apoptosis in addition to tissue sloughing during withdrawal bleeding.  Activation of the progesterone receptors is believed to lead stromal decidualization and thinning of the lining.  We reviewed the 3 most studied options, to include levonorgesterol IUD (67mg/d), oral medorxyprogesterone acetate, or oral megesterol acetate.  I discussed some of the side effects related to oral progesterone including GI side effects, edema, and VTE.  Given that the IUD appears to have similar if not better response rates then oral progesterone, I typically recommend using the Mirena IUD for treatment.  We discussed that treatment with progesterone would require continued follow-up, which would include a biopsy every 3 months initially until we prove regression.   Patient desires to proceed with surgical management.  The patient is a suitable candidate for hysterectomy via a minimally invasive approach to surgery.  Given that she is postmenopausal, a bilateral salpingo-oophorectomy is also recommended.  We reviewed that robotic assistance would be used to complete the surgery.  We discussed that endometrial cancer is detected in about 40% of final uterine pathology specimens from patients with EIN.    I discussed  her complex prior surgical history notably that she had multiple bouts of diverticulitis with fluid collections/abscesses at the time of her rectosigmoid resection last year.  The left adnexa was involved requiring its removal.  It sounds like the posterior uterus was also involved but able to be freed from the surrounding fluid collections.  We discussed that given this history, surgery may require additional colon surgery if the uterus is adherent to the colon.  I offered several options.  I offered that we could start with diagnostic laparoscopy and if findings at the time of laparoscopy revealed that the patient would need extensive surgery including potentially bowel surgery, that we could abort and continue with a D&C and Mirena IUD placement.  We discussed the possibility of D&C and Mirena IUD placement only.  And finally we discussed the plan to proceed with definitive surgery even if this means more extensive surgery including possible bowel resection.  Ultimately, after our discussion, the patient would like to move forward with definitive surgery.  We discussed 2 options at the time of surgery with regard to her EIN.  Given the risk of malignancy found on final hysterectomy specimen approximately 40% of the time, we can plan to send the uterus for intraoperative frozen pathology.  If cancer is identified at the time of surgery, additional procedures including lymph node evaluation, is recommended.  Alternatively, a sentinel lymph node biopsy procedure could be performed.  While this would be over treatment if no malignancy is identified on her final specimen, it would eliminate the need for full lymphadenectomy if the patient met Mayo criteria on frozen section.  After considering the risks and benefits of each option, the patient wanted to move forward with sentinel lymph node biopsy.  She understands that the backup if  mapping is not successful be to send the uterus for frozen section.  We  reviewed the sentinel lymph node technique. Risks and benefits of sentinel lymph node biopsy was reviewed. We reviewed the technique and ICG dye. The patient DOES NOT have an iodine allergy or known liver dysfunction. We reviewed the false negative rate (0.4%), and that 3% of patients with metastatic disease will not have it detected by SLN biopsy in endometrial cancer. A low risk of allergic reaction to the dye, <0.2% for ICG, has been reported. We also discussed that in the case of failed mapping, which occurs 40% of the time, a bilateral or unilateral lymphadenectomy will be performed at the surgeon's discretion.   Potential benefits of sentinel nodes including a higher detection rate for metastasis due to ultrastaging and potential reduction in operative morbidity. However, there remains uncertainty as to the role for treatment of micrometastatic disease. Further, the benefit of operative morbidity associated with the SLN technique in endometrial cancer is not yet completely known. In other patient populations (e.g. the cervical cancer population) there has been observed reductions in morbidity with SLN biopsy compared to pelvic lymphadenectomy. Lymphedema, nerve dysfunction and lymphocysts are all potential risks with the SLN technique as with complete lymphadenectomy. Additional risks to the patient include the risk of damage to an internal organ while operating in an altered view (e.g. the black and white image of the robotic fluorescence imaging mode).   The patient was consented for a robotic assisted hysterectomy, bilateral salpingo-oophorectomy, sentinel lymph node evaluation, possible lymph node dissection, possible laparotomy. The risks of surgery were discussed in detail and she understands these to include infection; wound separation; hernia; vaginal cuff separation, injury to adjacent organs such as bowel, bladder, blood vessels, ureters and nerves; bleeding which may require blood transfusion;  anesthesia risk; thromboembolic events; possible death; unforeseen complications; possible need for re-exploration; medical complications such as heart attack, stroke, pleural effusion and pneumonia; and, if full lymphadenectomy is performed the risk of lymphedema and lymphocyst. The patient will receive DVT and antibiotic prophylaxis as indicated. She voiced a clear understanding. She had the opportunity to ask questions. Perioperative instructions were reviewed with her. Prescriptions for post-op medications were sent to her pharmacy of choice.  Will plan for a bowel prep given her prior surgical history.  Given her family history, I suggested referral to genetics which was placed today.  A copy of this note was sent to the patient's referring provider.   80 minutes of total time was spent for this patient encounter, including preparation, face-to-face counseling with the patient and coordination of care, and documentation of the encounter.  Jeral Pinch, MD  Division of Gynecologic Oncology  Department of Obstetrics and Gynecology  [facility]  ___________________________________________  Chief Complaint: Chief Complaint  Patient presents with   Atypical endometrial hyperplasia    History of Present Illness:  Frances Gray is a 72 y.o. y.o. female who is seen in consultation at the request of Dr. Benjie Karvonen for an evaluation of endometrial intraepithelial neoplasia.  Patient developed postmenopausal bleeding on December 8.  She had daily heavy bleeding like a menses until January 18.  After developing the bleeding, she called her OB/GYN to be seen.  She denies any pelvic pain or cramping associated with the bleeding.  Since January 18, she denies any further bleeding or spotting.  Endometrial biopsy on 05/27/2022 reveals endometrial atypical hyperplasia, fragmented endometrial polyp. Pelvic ultrasound exam performed at [facility] on 05/27/2022 revealed  a  uterus measuring 8.7 x 5.6 x 4.5 cm with a thickened endometrium measuring 9.4 mm.  Right ovary measures 1.6 x 1 x 2.1 cm with a simple appearing cyst.  Left ovary is normal in appearance.  She endorses a good appetite, is working on eating less and losing weight.  She denies any nausea or emesis.  Since her colon surgery last March, she has had more regular bowel function but uses stool softeners and laxatives to help maintain regular bowel movements.  She has some urge urinary incontinence.  Prior history of robotic LAR with LSO and drainage of colo-uterine pelvic abscess in 08/2021.  At the time of that surgery, there were multiple abscesses described in the pelvis including 1 involving the left tube and ovary requiring removal of the structures at the time of her colonic resection.  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, R knee s/p TKR 6/16   Diverticulitis    GERD (gastroesophageal reflux disease)    doesn't take any meds   Heart murmur    no symptoms   History of bronchitis winter of 2014   History of colon polyps    benign   History of kidney stones    Hyperlipidemia    Hypertension    takes Hyzaar daily   Perforation of sigmoid colon due to diverticulitis 09/24/2019   Pneumonia    Pre-diabetes    Vitamin D deficiency disease 08/14/2011   takes Vit D daily     PAST SURGICAL HISTORY:  Past Surgical History:  Procedure Laterality Date   BREAST BIOPSY Left    Breast ultrasound Right 03/02/2013   The 28m complex cyst in the right breast is probably benign. Follow-up in 6 months with bilateral mammogram and (R) ultrasound   Breast Ultrasound Right 09/02/2013   There is a benign 3 mm complex cyst in the (R) breast at 9 o'clock   CPacifica  tumor removed   (benign)   KNEE  ARTHROSCOPY     bilateral   LAPAROSCOPIC LOW ANTERIOR RESECTION     robotic, with Dr. GJohney Mainein 2023   LITHOTRIPSY  08/2011   R ureteral stone   POLYPECTOMY     PROCTOSCOPY N/A 08/24/2021   Procedure: RIGID PROCTOSCOPY;  Surgeon: GMichael Boston MD;  Location: [facility];  Service: General;  Laterality: N/A;   TOTAL KNEE ARTHROPLASTY Left 11/16/2012   Procedure: TOTAL KNEE ARTHROPLASTY- left;  Surgeon: JJohnny Bridge MD;  [facility]  Service: Orthopedics;  Laterality: Left;   TOTAL KNEE ARTHROPLASTY Right 12/06/2014   Procedure: TOTAL KNEE ARTHROPLASTY;  Surgeon: JMarchia Bond MD;  [facility]  Service: Orthopedics;  Laterality: Right;   TUBAL LIGATION      OB/GYN HISTORY:  OB History  Gravida Para Term Preterm AB Living  3 2       2  $ SAB IAB Ectopic Multiple Live Births               # Outcome Date GA Lbr Len/2nd Weight Sex Delivery Anes PTL Lv  3 Gravida           2 Para           1 Para  No LMP recorded. Patient is postmenopausal.  Age at menarche: 7 Age at menopause: 50 Hx of HRT: denies Hx of STDs: denies Last pap: 09/2017 History of abnormal pap smears: denies  SCREENING STUDIES:  Last mammogram: 2023  Last colonoscopy: 2020  MEDICATIONS: Outpatient Encounter Medications as of 07/12/2022  Medication Sig   Biotin 10 MG TABS Take 10 mg by mouth daily.   [START ON 07/24/2022] bisacodyl (DULCOLAX) 5 MG EC tablet Take four tablets at 7 am the day before surgery   calcium carbonate (CALCIUM 600) 600 MG TABS tablet Take 1 tablet (600 mg total) by mouth 2 (two) times daily with a meal.   cetirizine (ZYRTEC) 10 MG tablet Take 10 mg by mouth as needed for allergies.   Cholecalciferol (VITAMIN D3) 2000 UNITS TABS Take 2,000 Units by mouth every morning.    clobetasol (OLUX) 0.05 % topical foam Apply topically every other day.   finasteride (PROSCAR) 5 MG tablet Take 5 mg by mouth daily.   fluticasone (FLONASE) 50 MCG/ACT nasal spray Place 1 spray into  both nostrils daily as needed for allergies or rhinitis.    losartan-hydrochlorothiazide (HYZAAR) 100-25 MG tablet TAKE 1 TABLET BY MOUTH EVERY DAY   medroxyPROGESTERone (PROVERA) 10 MG tablet Take 10 mg by mouth daily.   metroNIDAZOLE (FLAGYL) 500 MG tablet Take two tablets (1000 mg total) at 2 pm, 3pm, and 10 pm the day before surgery   Multiple Vitamin (MULTIVITAMIN) tablet Take 1 tablet by mouth every morning.    Multiple Vitamins-Minerals (ZINC PO) Take 1 tablet by mouth daily.   [START ON 07/24/2022] polyethylene glycol powder (MIRALAX) 17 GM/SCOOP powder Starting at 10:00 am day before surgery, mix the bottle of Miralax with the 64 ounces of Gatorade. Drink the Gatorade/Miralax mixture gradually (8 oz glass every 15 minutes) until gone. You should finish in 4 hours   rosuvastatin (CRESTOR) 5 MG tablet TAKE 1 TABLET BY MOUTH TWICE WEEKLY   Semaglutide (RYBELSUS) 3 MG TABS Take 3 mg by mouth daily.   traMADol (ULTRAM) 50 MG tablet Take 1 tablet (50 mg total) by mouth every 6 (six) hours as needed for severe pain. For AFTER surgery only, do not take and drive   [DISCONTINUED] sennosides-docusate sodium (SENOKOT-S) 8.6-50 MG tablet Take 1 tablet by mouth at bedtime. (Patient taking differently: Take 2 tablets by mouth at bedtime.)   [START ON 07/26/2022] sennosides-docusate sodium (SENOKOT-S) 8.6-50 MG tablet Take 2 tablets by mouth at bedtime. For AFTER surgery, do not take if having diarrhea   No facility-administered encounter medications on file as of 07/12/2022.    ALLERGIES:  Allergies  Allergen Reactions   Amlodipine Cough   Metformin And Related Diarrhea   Pravastatin     Joint ache     FAMILY HISTORY:  Family History  Problem Relation Age of Onset   Ovarian cancer Mother    Colon cancer Sister 58   Breast cancer Sister    Esophageal cancer Neg Hx    Colon polyps Neg Hx    Rectal cancer Neg Hx    Stomach cancer Neg Hx    Endometrial cancer Neg Hx    Pancreatic cancer Neg Hx     Prostate cancer Neg Hx      SOCIAL HISTORY:  Social Connections: Socially Integrated (07/11/2022)   Social Connection and Isolation Panel [NHANES]    Frequency of Communication with Friends and Family: Twice a week    Frequency of Social Gatherings with Friends and Family: Twice a  week    Attends Religious Services: More than 4 times per year    Active Member of Clubs or Organizations: Yes    Attends Archivist Meetings: More than 4 times per year    Marital Status: Married    REVIEW OF SYSTEMS:  + Abdominal pain, urinary frequency, menstrual problems, hot flashes, vaginal bleeding, vaginal discharge, back pain. Denies appetite changes, fevers, chills, fatigue, unexplained weight changes. Denies hearing loss, neck lumps or masses, mouth sores, ringing in ears or voice changes. Denies cough or wheezing.  Denies shortness of breath. Denies chest pain or palpitations. Denies leg swelling. Denies abdominal distention, blood in stools, constipation, diarrhea, nausea, vomiting, or early satiety. Denies pain with intercourse, dysuria, hematuria or incontinence.  Denies joint pain or muscle pain/cramps. Denies itching, rash, or wounds. Denies dizziness, headaches, numbness or seizures. Denies swollen lymph nodes or glands, denies easy bruising or bleeding. Denies anxiety, depression, confusion, or decreased concentration.  Physical Exam:  Vital Signs for this encounter:  Blood pressure (!) 148/78, pulse (!) 49, temperature 97.8 F (36.6 C), resp. rate 18, weight 265 lb 12.8 oz (120.6 kg), SpO2 100 %. Body mass index is 45.62 kg/m. General: Alert, oriented, no acute distress.  HEENT: Normocephalic, atraumatic. Sclera anicteric.  Chest: Clear to auscultation bilaterally. No wheezes, rhonchi, or rales. Cardiovascular: Regular rate and rhythm, no murmurs, rubs, or gallops.  Abdomen: Obese. Normoactive bowel sounds. Soft, nondistended, nontender to palpation. No masses or  hepatosplenomegaly appreciated. No palpable fluid wave. Well healed laparoscopic incision, suprapubic incision. Extremities: Grossly normal range of motion. Warm, well perfused. No edema bilaterally.  Skin: No rashes or lesions.  Lymphatics: No cervical, supraclavicular, or inguinal adenopathy.  GU:  Normal external female genitalia. No lesions. No discharge or bleeding.             Bladder/urethra:  No lesions or masses, well supported bladder             Vagina: Mildly atrophic, no lesions.             Cervix: Normal appearing, no lesions.             Uterus: Small, mobile, no parametrial involvement or nodularity.             Adnexa: No masses appreciated.  Rectal: Rectum does not feel tethered.  LABORATORY AND RADIOLOGIC DATA:  Outside medical records were reviewed to synthesize the above history, along with the history and physical obtained during the visit.   Lab Results  Component Value Date   WBC 10.7 (H) 08/27/2021   HGB 11.2 (L) 08/27/2021   HCT 34.1 (L) 08/27/2021   PLT 307 08/27/2021   GLUCOSE 108 (H) 05/29/2022   CHOL 205 (H) 05/29/2022   TRIG 101.0 05/29/2022   HDL 63.20 05/29/2022   LDLCALC 122 (H) 05/29/2022   ALT 23 05/29/2022   AST 19 05/29/2022   NA 138 05/29/2022   K 4.0 05/29/2022   CL 99 05/29/2022   CREATININE 0.78 05/29/2022   BUN 13 05/29/2022   CO2 33 (H) 05/29/2022   TSH 2.29 05/29/2022   INR 1.2 06/12/2021   HGBA1C 7.2 (H) 05/29/2022   MICROALBUR <0.7 11/27/2021

## 2022-07-12 ENCOUNTER — Other Ambulatory Visit: Payer: Self-pay

## 2022-07-12 ENCOUNTER — Inpatient Hospital Stay (HOSPITAL_BASED_OUTPATIENT_CLINIC_OR_DEPARTMENT_OTHER): Payer: Medicare Other | Admitting: Gynecologic Oncology

## 2022-07-12 ENCOUNTER — Encounter: Payer: Self-pay | Admitting: Gynecologic Oncology

## 2022-07-12 ENCOUNTER — Inpatient Hospital Stay: Payer: Medicare Other | Attending: Gynecologic Oncology | Admitting: Gynecologic Oncology

## 2022-07-12 VITALS — BP 148/78 | HR 49 | Temp 97.8°F | Resp 18 | Wt 265.8 lb

## 2022-07-12 DIAGNOSIS — Z800 Family history of malignant neoplasm of digestive organs: Secondary | ICD-10-CM | POA: Insufficient documentation

## 2022-07-12 DIAGNOSIS — Z8041 Family history of malignant neoplasm of ovary: Secondary | ICD-10-CM | POA: Diagnosis not present

## 2022-07-12 DIAGNOSIS — Z803 Family history of malignant neoplasm of breast: Secondary | ICD-10-CM | POA: Diagnosis not present

## 2022-07-12 DIAGNOSIS — Z8719 Personal history of other diseases of the digestive system: Secondary | ICD-10-CM

## 2022-07-12 DIAGNOSIS — N8502 Endometrial intraepithelial neoplasia [EIN]: Secondary | ICD-10-CM | POA: Diagnosis not present

## 2022-07-12 DIAGNOSIS — I10 Essential (primary) hypertension: Secondary | ICD-10-CM | POA: Insufficient documentation

## 2022-07-12 MED ORDER — SENNA-DOCUSATE SODIUM 8.6-50 MG PO TABS: 2.0000 | ORAL_TABLET | Freq: Every day | ORAL | Status: AC

## 2022-07-12 MED ORDER — METRONIDAZOLE 500 MG PO TABS: ORAL_TABLET | ORAL | 0 refills | Status: DC

## 2022-07-12 MED ORDER — BISACODYL 5 MG PO TBEC: DELAYED_RELEASE_TABLET | ORAL | 0 refills | Status: DC

## 2022-07-12 MED ORDER — TRAMADOL HCL 50 MG PO TABS: 50.0000 mg | ORAL_TABLET | Freq: Four times a day (QID) | ORAL | 0 refills | Status: DC | PRN

## 2022-07-12 MED ORDER — POLYETHYLENE GLYCOL 3350 17 GM/SCOOP PO POWD: ORAL | 0 refills | Status: DC

## 2022-07-12 NOTE — Patient Instructions (Signed)
Preparing for your Surgery   Plan for surgery on July 25, 2022 with Dr. Jeral Pinch at [facility] will be scheduled for diagnostic laparoscopy (looking into the abdomen with a camera), possible robotic assisted total laparoscopic hysterectomy (removal of the uterus and cervix), unilateral salpingo-oophorectomy (removal of one ovaries and fallopian tube(s)), sentinel lymph node biopsy, possible lymph node dissection, possible laparotomy (larger incision on your abdomen if needed), possible dilation and curettage of the uterus with Mirena intrauterine device placement.    Pre-operative Testing -You will receive a phone call from presurgical testing at [facility] to arrange for a pre-operative appointment and lab work.   -Bring your insurance card, copy of an advanced directive if applicable, medication list   -At that visit, you will be asked to sign a consent for a possible blood transfusion in case a transfusion becomes necessary during surgery.  The need for a blood transfusion is rare but having consent is a necessary part of your care.      -You should not be taking blood thinners or aspirin at least ten days prior to surgery unless instructed by your surgeon.   -Do not take supplements such as fish oil (omega 3), red yeast rice, turmeric before your surgery. You want to avoid medications with aspirin in them including headache powders such as BC or Goody's), Excedrin migraine.   Day Before Surgery at [facility] will be advised you can have clear liquids up until 3 hours before your surgery.     AVOID GAS PRODUCING FOODS AND BEVERAGES. Things to avoid include carbonated beverages (fizzy beverages, sodas), raw fruits and raw vegetables (uncooked), or beans.    If your bowels are filled with gas, your surgeon will have difficulty visualizing your pelvic organs which increases your surgical risks.   Your role in recovery Your role is to become  active as soon as directed by your doctor, while still giving yourself time to heal.  Rest when you feel tired. You will be asked to do the following in order to speed your recovery:   - Cough and breathe deeply. This helps to clear and expand your lungs and can prevent pneumonia after surgery.  - [place]. Do mild physical activity. Walking or moving your legs help your circulation and body functions return to normal. Do not try to get up or walk alone the first time after surgery.   -If you develop swelling on one leg or the other, pain in the back of your leg, redness/warmth in one of your legs, please call the office or go to the Emergency Room to have a doppler to rule out a blood clot. For shortness of breath, chest pain-seek care in the Emergency Room as soon as possible. - Actively manage your pain. Managing your pain lets you move in comfort. We will ask you to rate your pain on a scale of zero to 10. It is your responsibility to tell your doctor or nurse where and how much you hurt so your pain can be treated.   Special Considerations -If you are diabetic, you may be placed on insulin after surgery to have closer control over your blood sugars to promote healing and recovery.  This does not mean that you will be discharged on insulin.  If applicable, your oral antidiabetics will be resumed when you are tolerating a solid diet.   -Your final pathology results from surgery should be available around  one week after surgery and the results will be relayed to you when available.   -FMLA forms can be faxed to [phone] and please allow 5-7 business days for completion.   Pain Management After Surgery -You have been prescribed your pain medication and bowel regimen medications before surgery so that you can have these available when you are discharged from the hospital. The pain medication is for use ONLY AFTER surgery and a new prescription will not be given.    -Make  sure that you have Tylenol and Ibuprofen IF YOU ARE ABLE TO TAKE THESE MEDICATIONS at home to use on a regular basis after surgery for pain control. We recommend alternating the medications every hour to six hours since they work differently and are processed in the body differently for pain relief.   -Review the attached handout on narcotic use and their risks and side effects.    Bowel Regimen -You have been prescribed Sennakot-S to take nightly to prevent constipation especially if you are taking the narcotic pain medication intermittently.  It is important to prevent constipation and drink adequate amounts of liquids. You can stop taking this medication when you are not taking pain medication and you are back on your normal bowel routine.   Risks of Surgery Risks of surgery are low but include bleeding, infection, damage to surrounding structures, re-operation, blood clots, and very rarely death.     Blood Transfusion Information (For the consent to be signed before surgery)   We will be checking your blood type before surgery so in case of emergencies, we will know what type of blood you would need.                                             WHAT IS A BLOOD TRANSFUSION?   A transfusion is the replacement of blood or some of its parts. Blood is made up of multiple cells which provide different functions. Red blood cells carry oxygen and are used for blood loss replacement. White blood cells fight against infection. Platelets control bleeding. Plasma helps clot blood. Other blood products are available for specialized needs, such as hemophilia or other clotting disorders. BEFORE THE TRANSFUSION  Who gives blood for transfusions?  You may be able to donate blood to be used at a later date on yourself (autologous donation). Relatives can be asked to donate blood. This is generally not any safer than if you have received blood from a stranger. The same precautions are taken to ensure  safety when a relative's blood is donated. Healthy volunteers who are fully evaluated to make sure their blood is safe. This is blood bank blood. Transfusion therapy is the safest it has ever been in the practice of medicine. Before blood is taken from a donor, a complete history is taken to make sure that person has no history of diseases nor engages in risky social behavior (examples are intravenous drug use or sexual activity with multiple partners). The donor's travel history is screened to minimize risk of transmitting infections, such as malaria. The donated blood is tested for signs of infectious diseases, such as HIV and hepatitis. The blood is then tested to be sure it is compatible with you in order to minimize the chance of a transfusion reaction. If you or a relative donates blood, this is often done in anticipation of surgery and is  not appropriate for emergency situations. It takes many days to process the donated blood. RISKS AND COMPLICATIONS Although transfusion therapy is very safe and saves many lives, the main dangers of transfusion include:  Getting an infectious disease. Developing a transfusion reaction. This is an allergic reaction to something in the blood you were given. Every precaution is taken to prevent this. The decision to have a blood transfusion has been considered carefully by your caregiver before blood is given. Blood is not given unless the benefits outweigh the risks.   AFTER SURGERY INSTRUCTIONS   Return to work: 4-6 weeks if applicable   Activity: 1. Be up and out of the bed during the day.  Take a nap if needed.  You may walk up steps but be careful and use the hand rail.  Stair climbing will tire you more than you think, you may need to stop part way and rest.    2. No lifting or straining for 6 weeks over 10 pounds. No pushing, pulling, straining for 6 weeks.   3. No driving for around 1 week(s).  Do not drive if you are taking narcotic pain medicine and  make sure that your reaction time has returned.    4. You can shower as soon as the next day after surgery. Shower daily.  Use your regular soap and water (not directly on the incision) and pat your incision(s) dry afterwards; don't rub.  No tub baths or submerging your body in water until cleared by your surgeon. If you have the soap that was given to you by pre-surgical testing that was used before surgery, you do not need to use it afterwards because this can irritate your incisions.    5. No sexual activity and nothing in the vagina for 10-12 weeks.   6. You may experience a small amount of clear drainage from your incisions, which is normal.  If the drainage persists, increases, or changes color please call the office.   7. Do not use creams, lotions, or ointments such as neosporin on your incisions after surgery until advised by your surgeon because they can cause removal of the dermabond glue on your incisions.     8. You may experience vaginal spotting after surgery or around the 6-8 week mark from surgery when the stitches at the top of the vagina begin to dissolve.  The spotting is normal but if you experience heavy bleeding, call our office.   9. Take Tylenol or ibuprofen first for pain if you are able to take these medications and only use narcotic pain medication for severe pain not relieved by the Tylenol or Ibuprofen.  Monitor your Tylenol intake to a max of 4,000 mg in a 24 hour period. You can alternate these medications after surgery.   Diet: 1. Low sodium Heart Healthy Diet is recommended but you are cleared to resume your normal (before surgery) diet after your procedure.   2. It is safe to use a laxative, such as Miralax or Colace, if you have difficulty moving your bowels. You have been prescribed Sennakot-S to take at bedtime every evening after surgery to keep bowel movements regular and to prevent constipation.     Wound Care: 1. Keep clean and dry.  Shower daily.    Reasons to call the Doctor: Fever - Oral temperature greater than 100.4 degrees Fahrenheit Foul-smelling vaginal discharge Difficulty urinating Nausea and vomiting Increased pain at the site of the incision that is unrelieved with pain medicine. Difficulty breathing  with or without chest pain New calf pain especially if only on one side Sudden, continuing increased vaginal bleeding with or without clots.   Contacts: For questions or concerns you should contact:   Dr. Jeral Pinch at [phone]   Joylene John, NP at [phone]   After Hours: call [phone] and have the GYN Oncologist paged/contacted (after 5 pm or on the weekends).   Messages sent via mychart are for non-urgent matters and are not responded to after hours so for urgent needs, please call the after hours number.     [facility] BOWEL PREP     FIVE DAYS PRIOR TO YOUR SURGERY   Obtain supplies for the bowel prep at a pharmacy of your choice: Office sent prescription for your antibiotic pills (Flagyl)  A bottle of Miralax (238 g)- prescription sent in A large bottle of Gatorade/Powerade (64 oz), avoid red/purple coloring- no prescription required Dulcolax tablets (4 tablets)- prescription sent in   Change your diet to make the bowel prep go more easily: Switch to a bland, low fiber diet Stop eating any nuts, popcorn, or fruit with seeds.  Stop all fiber supplements such as Metamucil, Miralax, etc.     Improve nutrition: Consider drinking 2-3 nutritional shakes (Ex: Ensure Surgery) every day, starting 5 days prior to surgery     DAY PRIOR TO SURGERY    Switch to a full liquid diet the day before surgery Drink plenty of liquids all day to avoid getting dehydrated      7:00 am Swallow 4 dulcolax tablets with some water   10:00 am Mix the bottle of Miralax with the 64 ounces of Gatorade Drink the Gatorade/Miralax mixture gradually (8 oz glass every 15 minutes) until gone. (You should finish in 4  hours)   2:00pm Take 2 Flagyl (total of 1000 mg) tablets   3:00pm Take 2 Flagyl (total of 1000 mg) tablets Drink plenty of clear liquids all evening to avoid getting dehydrated   10:00pm Take 2 Flagyl (total of 1000 mg) tablets Drink 2 Carbohydrate loading nutrition drinks (ex: Ensure Presurgery). These will be given at pre-op appointment. Do not eat anything solid after bedtime (midnight) the night before your surgery.   BUT DO drink plenty of clear liquids (Water, Gatorade, juice, soda, coffee, tea, broths, etc.) up to 3 hours prior to surgery to avoid getting dehydrated.      MORNING OF SURGERY   Remember to not to eat anything solid that morning  Drink one final carbohydrate loading nutritional drink (ex: Ensure Presurgery) upon waking up in the morning (needs to be 3 hours before your surgery). Hold or take medications as recommended by the hospital staff at your Preoperative visit Stop drinking liquids before you leave the house (>3 hours prior to surgery)

## 2022-07-12 NOTE — Progress Notes (Signed)
Patient here for new patient consultation with Dr. Berline Lopes and for a pre-operative appointment prior to her scheduled surgery on July 25, 2022. She is scheduled for diagnostic laparoscopy, possible robotic assisted total laparoscopic hysterectomy, unilateral salpingo-oophorectomy, sentinel lymph node biopsy, possible lymph node dissection, possible laparotomy, possible dilation and curettage of the uterus with Mirena intrauterine device placement. The surgery was discussed in detail.  See after visit summary for additional details. Visual aids used to discuss items related to surgery.     Discussed post-op pain management in detail including the aspects of the enhanced recovery pathway.  Advised her that a new prescription would be sent in for tramadol and it is only to be used for after her upcoming surgery.  We discussed the use of tylenol post-op and to monitor for a maximum of 4,000 mg in a 24 hour period.  Also prescribed sennakot to be used after surgery and to hold if having loose stools.  Discussed bowel regimen in detail.    Discussed measures to take at home to prevent DVT including frequent mobility.  Reportable signs and symptoms of DVT discussed. Post-operative instructions discussed and expectations for after surgery. Incisional care discussed as well including reportable signs and symptoms including erythema, drainage, wound separation. Bowel prep discussed in detail and medications prescribed.    10 minutes spent with the patient.  Verbalizing understanding of material discussed. No needs or concerns voiced at the end of the visit.   Advised patient to call for any needs.  Advised that her post-operative medications had been prescribed and could be picked up at any time.    This appointment is included in the global surgical bundle as pre-operative teaching and has no charge.

## 2022-07-12 NOTE — Patient Instructions (Addendum)
Preparing for your Surgery  Plan for surgery on July 25, 2022 with Dr. Jeral Pinch at [facility] will be scheduled for diagnostic laparoscopy (looking into the abdomen with a camera), possible robotic assisted total laparoscopic hysterectomy (removal of the uterus and cervix), unilateral salpingo-oophorectomy (removal of one ovaries and fallopian tube(s)), sentinel lymph node biopsy, possible lymph node dissection, possible laparotomy (larger incision on your abdomen if needed), possible dilation and curettage of the uterus with Mirena intrauterine device placement.   Pre-operative Testing -You will receive a phone call from presurgical testing at [facility] to arrange for a pre-operative appointment and lab work.  -Bring your insurance card, copy of an advanced directive if applicable, medication list  -At that visit, you will be asked to sign a consent for a possible blood transfusion in case a transfusion becomes necessary during surgery.  The need for a blood transfusion is rare but having consent is a necessary part of your care.     -You should not be taking blood thinners or aspirin at least ten days prior to surgery unless instructed by your surgeon.  -Do not take supplements such as fish oil (omega 3), red yeast rice, turmeric before your surgery. You want to avoid medications with aspirin in them including headache powders such as BC or Goody's), Excedrin migraine.  Day Before Surgery at [facility] will be advised you can have clear liquids up until 3 hours before your surgery.    AVOID GAS PRODUCING FOODS AND BEVERAGES. Things to avoid include carbonated beverages (fizzy beverages, sodas), raw fruits and raw vegetables (uncooked), or beans.   If your bowels are filled with gas, your surgeon will have difficulty visualizing your pelvic organs which increases your surgical risks.  Your role in recovery Your role is to become active as  soon as directed by your doctor, while still giving yourself time to heal.  Rest when you feel tired. You will be asked to do the following in order to speed your recovery:  - Cough and breathe deeply. This helps to clear and expand your lungs and can prevent pneumonia after surgery.  - [place]. Do mild physical activity. Walking or moving your legs help your circulation and body functions return to normal. Do not try to get up or walk alone the first time after surgery.   -If you develop swelling on one leg or the other, pain in the back of your leg, redness/warmth in one of your legs, please call the office or go to the Emergency Room to have a doppler to rule out a blood clot. For shortness of breath, chest pain-seek care in the Emergency Room as soon as possible. - Actively manage your pain. Managing your pain lets you move in comfort. We will ask you to rate your pain on a scale of zero to 10. It is your responsibility to tell your doctor or nurse where and how much you hurt so your pain can be treated.  Special Considerations -If you are diabetic, you may be placed on insulin after surgery to have closer control over your blood sugars to promote healing and recovery.  This does not mean that you will be discharged on insulin.  If applicable, your oral antidiabetics will be resumed when you are tolerating a solid diet.  -Your final pathology results from surgery should be available around one week after surgery and the results will be relayed to you when  available.  -FMLA forms can be faxed to [phone] and please allow 5-7 business days for completion.  Pain Management After Surgery -You have been prescribed your pain medication and bowel regimen medications before surgery so that you can have these available when you are discharged from the hospital. The pain medication is for use ONLY AFTER surgery and a new prescription will not be given.   -Make sure that you have  Tylenol and Ibuprofen IF YOU ARE ABLE TO TAKE THESE MEDICATIONS at home to use on a regular basis after surgery for pain control. We recommend alternating the medications every hour to six hours since they work differently and are processed in the body differently for pain relief.  -Review the attached handout on narcotic use and their risks and side effects.   Bowel Regimen -You have been prescribed Sennakot-S to take nightly to prevent constipation especially if you are taking the narcotic pain medication intermittently.  It is important to prevent constipation and drink adequate amounts of liquids. You can stop taking this medication when you are not taking pain medication and you are back on your normal bowel routine.  Risks of Surgery Risks of surgery are low but include bleeding, infection, damage to surrounding structures, re-operation, blood clots, and very rarely death.   Blood Transfusion Information (For the consent to be signed before surgery)  We will be checking your blood type before surgery so in case of emergencies, we will know what type of blood you would need.                                            WHAT IS A BLOOD TRANSFUSION?  A transfusion is the replacement of blood or some of its parts. Blood is made up of multiple cells which provide different functions. Red blood cells carry oxygen and are used for blood loss replacement. White blood cells fight against infection. Platelets control bleeding. Plasma helps clot blood. Other blood products are available for specialized needs, such as hemophilia or other clotting disorders. BEFORE THE TRANSFUSION  Who gives blood for transfusions?  You may be able to donate blood to be used at a later date on yourself (autologous donation). Relatives can be asked to donate blood. This is generally not any safer than if you have received blood from a stranger. The same precautions are taken to ensure safety when a relative's blood  is donated. Healthy volunteers who are fully evaluated to make sure their blood is safe. This is blood bank blood. Transfusion therapy is the safest it has ever been in the practice of medicine. Before blood is taken from a donor, a complete history is taken to make sure that person has no history of diseases nor engages in risky social behavior (examples are intravenous drug use or sexual activity with multiple partners). The donor's travel history is screened to minimize risk of transmitting infections, such as malaria. The donated blood is tested for signs of infectious diseases, such as HIV and hepatitis. The blood is then tested to be sure it is compatible with you in order to minimize the chance of a transfusion reaction. If you or a relative donates blood, this is often done in anticipation of surgery and is not appropriate for emergency situations. It takes many days to process the donated blood. RISKS AND COMPLICATIONS Although transfusion therapy is very safe and  saves many lives, the main dangers of transfusion include:  Getting an infectious disease. Developing a transfusion reaction. This is an allergic reaction to something in the blood you were given. Every precaution is taken to prevent this. The decision to have a blood transfusion has been considered carefully by your caregiver before blood is given. Blood is not given unless the benefits outweigh the risks.  AFTER SURGERY INSTRUCTIONS  Return to work: 4-6 weeks if applicable  Activity: 1. Be up and out of the bed during the day.  Take a nap if needed.  You may walk up steps but be careful and use the hand rail.  Stair climbing will tire you more than you think, you may need to stop part way and rest.   2. No lifting or straining for 6 weeks over 10 pounds. No pushing, pulling, straining for 6 weeks.  3. No driving for around 1 week(s).  Do not drive if you are taking narcotic pain medicine and make sure that your reaction time  has returned.   4. You can shower as soon as the next day after surgery. Shower daily.  Use your regular soap and water (not directly on the incision) and pat your incision(s) dry afterwards; don't rub.  No tub baths or submerging your body in water until cleared by your surgeon. If you have the soap that was given to you by pre-surgical testing that was used before surgery, you do not need to use it afterwards because this can irritate your incisions.   5. No sexual activity and nothing in the vagina for 10-12 weeks.  6. You may experience a small amount of clear drainage from your incisions, which is normal.  If the drainage persists, increases, or changes color please call the office.  7. Do not use creams, lotions, or ointments such as neosporin on your incisions after surgery until advised by your surgeon because they can cause removal of the dermabond glue on your incisions.    8. You may experience vaginal spotting after surgery or around the 6-8 week mark from surgery when the stitches at the top of the vagina begin to dissolve.  The spotting is normal but if you experience heavy bleeding, call our office.  9. Take Tylenol or ibuprofen first for pain if you are able to take these medications and only use narcotic pain medication for severe pain not relieved by the Tylenol or Ibuprofen.  Monitor your Tylenol intake to a max of 4,000 mg in a 24 hour period. You can alternate these medications after surgery.  Diet: 1. Low sodium Heart Healthy Diet is recommended but you are cleared to resume your normal (before surgery) diet after your procedure.  2. It is safe to use a laxative, such as Miralax or Colace, if you have difficulty moving your bowels. You have been prescribed Sennakot-S to take at bedtime every evening after surgery to keep bowel movements regular and to prevent constipation.    Wound Care: 1. Keep clean and dry.  Shower daily.  Reasons to call the Doctor: Fever - Oral  temperature greater than 100.4 degrees Fahrenheit Foul-smelling vaginal discharge Difficulty urinating Nausea and vomiting Increased pain at the site of the incision that is unrelieved with pain medicine. Difficulty breathing with or without chest pain New calf pain especially if only on one side Sudden, continuing increased vaginal bleeding with or without clots.   Contacts: For questions or concerns you should contact:  Dr. Jeral Pinch at [phone]  Joylene John, NP at [phone]  After Hours: call [phone] and have the GYN Oncologist paged/contacted (after 5 pm or on the weekends).  Messages sent via mychart are for non-urgent matters and are not responded to after hours so for urgent needs, please call the after hours number.   [facility] BOWEL PREP     FIVE DAYS PRIOR TO YOUR SURGERY   Obtain supplies for the bowel prep at a pharmacy of your choice: Office sent prescription for your antibiotic pills (Flagyl)  A bottle of Miralax (238 g)- prescription sent in A large bottle of Gatorade/Powerade (64 oz), avoid red/purple coloring- no prescription required Dulcolax tablets (4 tablets)- prescription sent in   Change your diet to make the bowel prep go more easily: Switch to a bland, low fiber diet Stop eating any nuts, popcorn, or fruit with seeds.  Stop all fiber supplements such as Metamucil, Miralax, etc.     Improve nutrition: Consider drinking 2-3 nutritional shakes (Ex: Ensure Surgery) every day, starting 5 days prior to surgery     DAY PRIOR TO SURGERY    Switch to a full liquid diet the day before surgery Drink plenty of liquids all day to avoid getting dehydrated      7:00 am Swallow 4 dulcolax tablets with some water   10:00 am Mix the bottle of Miralax with the 64 ounces of Gatorade Drink the Gatorade/Miralax mixture gradually (8 oz glass every 15 minutes) until gone. (You should finish in 4 hours)   2:00pm Take 2 Flagyl (total of 1000 mg)  tablets   3:00pm Take 2 Flagyl (total of 1000 mg) tablets Drink plenty of clear liquids all evening to avoid getting dehydrated   10:00pm Take 2 Flagyl (total of 1000 mg) tablets Drink 2 Carbohydrate loading nutrition drinks (ex: Ensure Presurgery). These will be given at pre-op appointment. Do not eat anything solid after bedtime (midnight) the night before your surgery.   BUT DO drink plenty of clear liquids (Water, Gatorade, juice, soda, coffee, tea, broths, etc.) up to 3 hours prior to surgery to avoid getting dehydrated.      MORNING OF SURGERY   Remember to not to eat anything solid that morning  Drink one final carbohydrate loading nutritional drink (ex: Ensure Presurgery) upon waking up in the morning (needs to be 3 hours before your surgery). Hold or take medications as recommended by the hospital staff at your Preoperative visit Stop drinking liquids before you leave the house (>3 hours prior to surgery)

## 2022-07-15 ENCOUNTER — Telehealth: Payer: Self-pay | Admitting: *Deleted

## 2022-07-15 ENCOUNTER — Telehealth: Payer: Self-pay | Admitting: Internal Medicine

## 2022-07-15 NOTE — Telephone Encounter (Signed)
Placed in Dr. Quay Burow blue folder to review.

## 2022-07-15 NOTE — Telephone Encounter (Signed)
For our records:  We have received Pre-Op PW for the pt and it has been placed in Dr. Quay Burow boxes.   Upon completion please fax to: [phone]

## 2022-07-15 NOTE — Telephone Encounter (Signed)
Per Dr Berline Lopes fax surgical optimization and records to PCP

## 2022-07-16 ENCOUNTER — Encounter: Payer: Self-pay | Admitting: Gynecologic Oncology

## 2022-07-16 DIAGNOSIS — N8502 Endometrial intraepithelial neoplasia [EIN]: Secondary | ICD-10-CM | POA: Insufficient documentation

## 2022-07-16 NOTE — Telephone Encounter (Signed)
Clearance faxed back today

## 2022-07-16 NOTE — Telephone Encounter (Signed)
Conformation recieved

## 2022-07-17 ENCOUNTER — Encounter (HOSPITAL_COMMUNITY): Admission: RE | Admit: 2022-07-17 | Payer: Medicare Other | Source: Ambulatory Visit

## 2022-07-22 NOTE — Patient Instructions (Signed)
SURGICAL WAITING ROOM VISITATION  Patients having surgery or a procedure may have no more than 2 support people in the waiting area - these visitors may rotate.    Children under the age of 75 must have an adult with them who is not the patient.  Due to an increase in RSV and influenza rates and associated hospitalizations, children ages 8 and under may not visit patients in [place].  If the patient needs to stay at the hospital during part of their recovery, the visitor guidelines for inpatient rooms apply. Pre-op nurse will coordinate an appropriate time for 1 support person to accompany patient in pre-op.  This support person may not rotate.    Please refer to the [facility] website for the visitor guidelines for Inpatients (after your surgery is over and you are in a regular room).    Your procedure is scheduled on: 07/25/22   Report to [facility] Main Entrance    Report to admitting at 5:15 AM   Call this number if you have problems the morning of surgery [phone]   Follow a clear liquid diet the day before surgery.   You may have the following liquids until 4:30 AM DAY OF SURGERY  Water Non-Citrus Juices (without pulp, NO RED-Apple, White grape, White cranberry) Black Coffee (NO MILK/CREAM OR CREAMERS, sugar ok)  Clear Tea (NO MILK/CREAM OR CREAMERS, sugar ok) regular and decaf                             Plain Jell-O (NO RED)                                           Fruit ices (not with fruit pulp, NO RED)                                     Popsicles (NO RED)                                                               Sports drinks like Gatorade (NO RED)          If you have questions, please contact your surgeon's office.   FOLLOW BOWEL PREP AND ANY ADDITIONAL PRE OP INSTRUCTIONS YOU RECEIVED FROM YOUR SURGEON'S OFFICE!!!     Oral Hygiene is also important to reduce your risk of infection.                                    Remember -  BRUSH YOUR TEETH THE MORNING OF SURGERY WITH YOUR REGULAR TOOTHPASTE  DENTURES WILL BE REMOVED PRIOR TO SURGERY PLEASE DO NOT APPLY "Poly grip" OR ADHESIVES!!!   Take these medicines the morning of surgery with A SIP OF WATER: Zyrtec, Finasteride, Rosuvastatin                               You may not have any  metal on your body including hair pins, jewelry, and body piercing             Do not wear make-up, lotions, powders, perfumes, or deodorant  Do not wear nail polish including gel and S&S, artificial/acrylic nails, or any other type of covering on natural nails including finger and toenails. If you have artificial nails, gel coating, etc. that needs to be removed by a nail salon please have this removed prior to surgery or surgery may need to be canceled/ delayed if the surgeon/ anesthesia feels like they are unable to be safely monitored.   Do not shave  48 hours prior to surgery.    Do not bring valuables to the hospital. [place].   Contacts, glasses, dentures or bridgework may not be worn into surgery.  DO NOT [place]. PHARMACY WILL DISPENSE MEDICATIONS LISTED ON YOUR MEDICATION LIST TO YOU DURING YOUR ADMISSION [place]!    Patients discharged on the day of surgery will not be allowed to drive home.  Someone NEEDS to stay with you for the first 24 hours after anesthesia.   Special Instructions: Bring a copy of your healthcare power of attorney and living will documents the day of surgery if you haven't scanned them before.              Please read over the following fact sheets you were given: IF YOU HAVE QUESTIONS ABOUT YOUR PRE-OP INSTRUCTIONS PLEASE CALL [phone]    If you received a COVID test during your pre-op visit  it is requested that you wear a mask when out in public, stay away from anyone that may not be feeling well and notify your surgeon if you develop  symptoms. If you test positive for Covid or have been in contact with anyone that has tested positive in the last 10 days please notify you surgeon.    [facility] - Preparing for Surgery Before surgery, you can play an important role.  Because skin is not sterile, your skin needs to be as free of germs as possible.  You can reduce the number of germs on your skin by washing with CHG (chlorahexidine gluconate) soap before surgery.  CHG is an antiseptic cleaner which kills germs and bonds with the skin to continue killing germs even after washing. Please DO NOT use if you have an allergy to CHG or antibacterial soaps.  If your skin becomes reddened/irritated stop using the CHG and inform your nurse when you arrive at Short Stay. Do not shave (including legs and underarms) for at least 48 hours prior to the first CHG shower.  You may shave your face/neck.  Please follow these instructions carefully:  1.  Shower with CHG Soap the night before surgery and the  morning of surgery.  2.  If you choose to wash your hair, wash your hair first as usual with your normal  shampoo.  3.  After you shampoo, rinse your hair and body thoroughly to remove the shampoo.                             4.  Use CHG as you would any other liquid soap.  You can apply chg directly to the skin and wash.  Gently with a scrungie or clean washcloth.  5.  Apply the CHG Soap to your body ONLY FROM THE NECK DOWN.   Do   not use on face/ open                           Wound or open sores. Avoid contact with eyes, ears mouth and   genitals (private parts).                       Wash face,  Genitals (private parts) with your normal soap.             6.  Wash thoroughly, paying special attention to the area where your    surgery  will be performed.  7.  Thoroughly rinse your body with warm water from the neck down.  8.  DO NOT shower/wash with your normal soap after using and rinsing off the CHG Soap.                9.  Pat yourself dry  with a clean towel.            10.  Wear clean pajamas.            11.  Place clean sheets on your bed the night of your first shower and do not  sleep with pets. Day of Surgery : Do not apply any lotions/deodorants the morning of surgery.  Please wear clean clothes to the hospital/surgery center.  FAILURE TO FOLLOW THESE INSTRUCTIONS MAY RESULT IN THE CANCELLATION OF YOUR SURGERY  PATIENT SIGNATURE_________________________________  NURSE SIGNATURE__________________________________  ________________________________________________________________________  Frances Gray  An incentive spirometer is a tool that can help keep your lungs clear and active. This tool measures how well you are filling your lungs with each breath. Taking long deep breaths may help reverse or decrease the chance of developing breathing (pulmonary) problems (especially infection) following: A long period of time when you are unable to move or be active. BEFORE THE PROCEDURE  If the spirometer includes an indicator to show your best effort, your nurse or respiratory therapist will set it to a desired goal. If possible, sit up straight or lean slightly forward. Try not to slouch. Hold the incentive spirometer in an upright position. INSTRUCTIONS FOR USE  Sit on the edge of your bed if possible, or sit up as far as you can in bed or on a chair. Hold the incentive spirometer in an upright position. Breathe out normally. Place the mouthpiece in your mouth and seal your lips tightly around it. Breathe in slowly and as deeply as possible, raising the piston or the ball toward the top of the column. Hold your breath for 3-5 seconds or for as long as possible. Allow the piston or ball to fall to the bottom of the column. Remove the mouthpiece from your mouth and breathe out normally. Rest for a few seconds and repeat Steps 1 through 7 at least 10 times every 1-2 hours when you are awake. Take your time and take a  few normal breaths between deep breaths. The spirometer may include an indicator to show your best effort. Use the indicator as a goal to work toward during each repetition. After each set of 10 deep breaths, practice coughing to be sure your lungs are clear. If you have an incision (the cut made at the time of surgery), support your incision when coughing by placing a pillow or rolled up towels firmly against it. Once  you are able to get out of bed, walk around indoors and cough well. You may stop using the incentive spirometer when instructed by your caregiver.  RISKS AND COMPLICATIONS Take your time so you do not get dizzy or light-headed. If you are in pain, you may need to take or ask for pain medication before doing incentive spirometry. It is harder to take a deep breath if you are having pain. AFTER USE Rest and breathe slowly and easily. It can be helpful to keep track of a log of your progress. Your caregiver can provide you with a simple table to help with this. If you are using the spirometer at home, follow these instructions: [place] IF:  You are having difficultly using the spirometer. You have trouble using the spirometer as often as instructed. Your pain medication is not giving enough relief while using the spirometer. You develop fever of 100.5 F (38.1 C) or higher. SEEK IMMEDIATE MEDICAL CARE IF:  You cough up bloody sputum that had not been present before. You develop fever of 102 F (38.9 C) or greater. You develop worsening pain at or near the incision site. MAKE SURE YOU:  Understand these instructions. Will watch your condition. Will get help right away if you are not doing well or get worse. Document Released: 10/07/2006 Document Revised: 08/19/2011 Document Reviewed: 12/08/2006 Exit[employer] Patient Information 2014 [employer], [place].   ________________________________________________________________________ WHAT IS A BLOOD TRANSFUSION? Blood Transfusion  Information  A transfusion is the replacement of blood or some of its parts. Blood is made up of multiple cells which provide different functions. Red blood cells carry oxygen and are used for blood loss replacement. White blood cells fight against infection. Platelets control bleeding. Plasma helps clot blood. Other blood products are available for specialized needs, such as hemophilia or other clotting disorders. BEFORE THE TRANSFUSION  Who gives blood for transfusions?  Healthy volunteers who are fully evaluated to make sure their blood is safe. This is blood bank blood. Transfusion therapy is the safest it has ever been in the practice of medicine. Before blood is taken from a donor, a complete history is taken to make sure that person has no history of diseases nor engages in risky social behavior (examples are intravenous drug use or sexual activity with multiple partners). The donor's travel history is screened to minimize risk of transmitting infections, such as malaria. The donated blood is tested for signs of infectious diseases, such as HIV and hepatitis. The blood is then tested to be sure it is compatible with you in order to minimize the chance of a transfusion reaction. If you or a relative donates blood, this is often done in anticipation of surgery and is not appropriate for emergency situations. It takes many days to process the donated blood. RISKS AND COMPLICATIONS Although transfusion therapy is very safe and saves many lives, the main dangers of transfusion include:  Getting an infectious disease. Developing a transfusion reaction. This is an allergic reaction to something in the blood you were given. Every precaution is taken to prevent this. The decision to have a blood transfusion has been considered carefully by your caregiver before blood is given. Blood is not given unless the benefits outweigh the risks. AFTER THE TRANSFUSION Right after receiving a blood transfusion,  you will usually feel much better and more energetic. This is especially true if your red blood cells have gotten low (anemic). The transfusion raises the level of the red blood cells which carry  oxygen, and this usually causes an energy increase. The nurse administering the transfusion will monitor you carefully for complications. HOME CARE INSTRUCTIONS  No special instructions are needed after a transfusion. You may find your energy is better. Speak with your caregiver about any limitations on activity for underlying diseases you may have. SEEK MEDICAL CARE IF:  Your condition is not improving after your transfusion. You develop redness or irritation at the intravenous (IV) site. SEEK IMMEDIATE MEDICAL CARE IF:  Any of the following symptoms occur over the next 12 hours: Shaking chills. You have a temperature by mouth above 102 F (38.9 C), not controlled by medicine. Chest, back, or muscle pain. People around you feel you are not acting correctly or are confused. Shortness of breath or difficulty breathing. Dizziness and fainting. You get a rash or develop hives. You have a decrease in urine output. Your urine turns a dark color or changes to pink, red, or brown. Any of the following symptoms occur over the next 10 days: You have a temperature by mouth above 102 F (38.9 C), not controlled by medicine. Shortness of breath. Weakness after normal activity. The white part of the eye turns yellow (jaundice). You have a decrease in the amount of urine or are urinating less often. Your urine turns a dark color or changes to pink, red, or brown. Document Released: 05/24/2000 Document Revised: 08/19/2011 Document Reviewed: 01/11/2008 [facility] Patient Information 2014 [place], [place].  _______________________________________________________________________

## 2022-07-22 NOTE — Progress Notes (Signed)
COVID Vaccine Completed: yes  Date of COVID positive in last 90 days:  PCP - Billey Gosling, MD Cardiologist -   Chest x-ray -  EKG - 08/09/21 Epic Stress Test -  ECHO -  Cardiac Cath -  Pacemaker/ICD device last checked: Spinal Cord Stimulator:  Bowel Prep - miralax, dulcolax, Flagyl   Sleep Study -  CPAP -   Fasting Blood Sugar -  Checks Blood Sugar _____ times a day  Last dose of GLP1 agonist-  N/A GLP1 instructions:  N/A   Last dose of SGLT-2 inhibitors-  N/A SGLT-2 instructions: N/A   Blood Thinner Instructions: Aspirin Instructions: Last Dose:  Activity level:  Can go up a flight of stairs and perform activities of daily living without stopping and without symptoms of chest pain or shortness of breath.  Able to exercise without symptoms  Unable to go up a flight of stairs without symptoms of     Anesthesia review:   Patient denies shortness of breath, fever, cough and chest pain at PAT appointment  Patient verbalized understanding of instructions that were given to them at the PAT appointment. Patient was also instructed that they will need to review over the PAT instructions again at home before surgery.

## 2022-07-22 NOTE — Progress Notes (Signed)
COVID Vaccine Completed: yes  Date of COVID positive in last 90 days:  PCP - Billey Gosling, MD Cardiologist -   Chest x-ray -  EKG - 08/09/21 Epic Stress Test -  ECHO -  Cardiac Cath -  Pacemaker/ICD device last checked: Spinal Cord Stimulator:  Bowel Prep -   Sleep Study -  CPAP -   Fasting Blood Sugar -  Checks Blood Sugar _____ times a day  Last dose of GLP1 agonist-  N/A GLP1 instructions:  N/A   Last dose of SGLT-2 inhibitors-  N/A SGLT-2 instructions: N/A   Blood Thinner Instructions: Aspirin Instructions: Last Dose:  Activity level:  Can go up a flight of stairs and perform activities of daily living without stopping and without symptoms of chest pain or shortness of breath.  Able to exercise without symptoms  Unable to go up a flight of stairs without symptoms of     Anesthesia review: HTN, heart murmur   Patient denies shortness of breath, fever, cough and chest pain at PAT appointment  Patient verbalized understanding of instructions that were given to them at the PAT appointment. Patient was also instructed that they will need to review over the PAT instructions again at home before surgery.

## 2022-07-23 ENCOUNTER — Encounter (HOSPITAL_COMMUNITY): Payer: Self-pay

## 2022-07-23 ENCOUNTER — Encounter (HOSPITAL_COMMUNITY)
Admission: RE | Admit: 2022-07-23 | Discharge: 2022-07-23 | Disposition: A | Discharge status: Home / Self Care | Payer: Medicare Other | Source: Ambulatory Visit | Attending: Gynecologic Oncology | Admitting: Gynecologic Oncology

## 2022-07-23 DIAGNOSIS — Z01812 Encounter for preprocedural laboratory examination: Secondary | ICD-10-CM | POA: Diagnosis not present

## 2022-07-23 DIAGNOSIS — N8502 Endometrial intraepithelial neoplasia [EIN]: Secondary | ICD-10-CM | POA: Diagnosis not present

## 2022-07-23 LAB — CBC
HCT: 41.2 % (ref 36.0–46.0)
Hemoglobin: 13.3 g/dL (ref 12.0–15.0)
MCH: 28.5 pg (ref 26.0–34.0)
MCHC: 32.3 g/dL (ref 30.0–36.0)
MCV: 88.2 fL (ref 80.0–100.0)
Platelets: 367 10*3/uL (ref 150–400)
RBC: 4.67 MIL/uL (ref 3.87–5.11)
RDW: 13.5 % (ref 11.5–15.5)
WBC: 6.7 10*3/uL (ref 4.0–10.5)
nRBC: 0 % (ref 0.0–0.2)

## 2022-07-23 LAB — COMPREHENSIVE METABOLIC PANEL
ALT: 28 U/L (ref 0–44)
AST: 24 U/L (ref 15–41)
Albumin: 4 g/dL (ref 3.5–5.0)
Alkaline Phosphatase: 65 U/L (ref 38–126)
Anion gap: 7 (ref 5–15)
BUN: 16 mg/dL (ref 8–23)
CO2: 29 mmol/L (ref 22–32)
Calcium: 9.7 mg/dL (ref 8.9–10.3)
Chloride: 101 mmol/L (ref 98–111)
Creatinine, Ser: 0.74 mg/dL (ref 0.44–1.00)
GFR, Estimated: >60 mL/min (ref >60)
Glucose, Bld: 119 mg/dL — ABNORMAL HIGH (ref 70–99)
Potassium: 3.7 mmol/L (ref 3.5–5.1)
Sodium: 137 mmol/L (ref 135–145)
Total Bilirubin: 0.6 mg/dL (ref 0.3–1.2)
Total Protein: 8.1 g/dL (ref 6.5–8.1)

## 2022-07-23 LAB — GLUCOSE, CAPILLARY: Glucose-Capillary: 118 mg/dL — ABNORMAL HIGH (ref 70–99)

## 2022-07-24 ENCOUNTER — Telehealth: Payer: Self-pay

## 2022-07-24 NOTE — Anesthesia Preprocedure Evaluation (Addendum)
Anesthesia Evaluation  Patient identified by MRN, date of birth, ID band Patient awake    Reviewed: Allergy & Precautions, NPO status , Patient's Chart, lab work & pertinent test results  History of Anesthesia Complications Negative for: history of anesthetic complications  Airway Mallampati: II  TM Distance: >3 FB Neck ROM: Full    Dental  (+) Dental Advisory Given, Teeth Intact   Pulmonary former smoker   Pulmonary exam normal        Cardiovascular hypertension, Pt. on medications Normal cardiovascular exam     Neuro/Psych negative neurological ROS  negative psych ROS   GI/Hepatic Neg liver ROS, hiatal hernia,GERD  Controlled,,  Endo/Other  diabetes  Morbid obesity  Renal/GU negative Renal ROS     Musculoskeletal  (+) Arthritis ,    Abdominal   Peds  Hematology negative hematology ROS (+)   Anesthesia Other Findings   Reproductive/Obstetrics                             Anesthesia Physical Anesthesia Plan  ASA: 3  Anesthesia Plan: General   Post-op Pain Management: Tylenol PO (pre-op)*   Induction: Intravenous  PONV Risk Score and Plan: 3 and Treatment may vary due to age or medical condition, Ondansetron and Dexamethasone  Airway Management Planned: Oral ETT  Additional Equipment: None  Intra-op Plan:   Post-operative Plan: Extubation in OR  Informed Consent: I have reviewed the patients History and Physical, chart, labs and discussed the procedure including the risks, benefits and alternatives for the proposed anesthesia with the patient or authorized representative who has indicated his/her understanding and acceptance.     Dental advisory given  Plan Discussed with: CRNA and Anesthesiologist  Anesthesia Plan Comments:        Anesthesia Quick Evaluation

## 2022-07-24 NOTE — Telephone Encounter (Signed)
Telephone call to check on pre-operative status.  Patient compliant with pre-operative instructions.  Reinforced nothing to eat after midnight. Clear liquids until 0430. Patient to arrive at 0515.  No questions or concerns voiced.  Instructed to call for any needs.  ?

## 2022-07-25 ENCOUNTER — Ambulatory Visit (HOSPITAL_COMMUNITY)
Admission: RE | Admit: 2022-07-25 | Discharge: 2022-07-26 | Disposition: A | Discharge status: Home / Self Care | Payer: Medicare Other | Source: Ambulatory Visit | Attending: Gynecologic Oncology | Admitting: Gynecologic Oncology

## 2022-07-25 ENCOUNTER — Ambulatory Visit (HOSPITAL_COMMUNITY): Payer: Medicare Other | Admitting: Registered Nurse

## 2022-07-25 ENCOUNTER — Encounter (HOSPITAL_COMMUNITY): Payer: Self-pay | Admitting: Gynecologic Oncology

## 2022-07-25 ENCOUNTER — Other Ambulatory Visit: Payer: Self-pay

## 2022-07-25 ENCOUNTER — Ambulatory Visit (HOSPITAL_BASED_OUTPATIENT_CLINIC_OR_DEPARTMENT_OTHER): Payer: Medicare Other | Admitting: Registered Nurse

## 2022-07-25 ENCOUNTER — Encounter (HOSPITAL_COMMUNITY)
Admission: RE | Disposition: A | Discharge status: Home / Self Care | Payer: Self-pay | Source: Ambulatory Visit | Attending: Gynecologic Oncology

## 2022-07-25 DIAGNOSIS — R42 Dizziness and giddiness: Secondary | ICD-10-CM | POA: Diagnosis not present

## 2022-07-25 DIAGNOSIS — I10 Essential (primary) hypertension: Secondary | ICD-10-CM | POA: Insufficient documentation

## 2022-07-25 DIAGNOSIS — Z79899 Other long term (current) drug therapy: Secondary | ICD-10-CM | POA: Insufficient documentation

## 2022-07-25 DIAGNOSIS — N836 Hematosalpinx: Secondary | ICD-10-CM | POA: Diagnosis not present

## 2022-07-25 DIAGNOSIS — Z6841 Body Mass Index (BMI) 40.0 and over, adult: Secondary | ICD-10-CM | POA: Diagnosis not present

## 2022-07-25 DIAGNOSIS — E876 Hypokalemia: Secondary | ICD-10-CM | POA: Diagnosis not present

## 2022-07-25 DIAGNOSIS — N8003 Adenomyosis of the uterus: Secondary | ICD-10-CM | POA: Diagnosis not present

## 2022-07-25 DIAGNOSIS — Z8719 Personal history of other diseases of the digestive system: Secondary | ICD-10-CM | POA: Insufficient documentation

## 2022-07-25 DIAGNOSIS — E119 Type 2 diabetes mellitus without complications: Secondary | ICD-10-CM | POA: Diagnosis not present

## 2022-07-25 DIAGNOSIS — Z87891 Personal history of nicotine dependence: Secondary | ICD-10-CM

## 2022-07-25 DIAGNOSIS — D62 Acute posthemorrhagic anemia: Secondary | ICD-10-CM | POA: Diagnosis not present

## 2022-07-25 DIAGNOSIS — N8502 Endometrial intraepithelial neoplasia [EIN]: Secondary | ICD-10-CM | POA: Diagnosis present

## 2022-07-25 DIAGNOSIS — K660 Peritoneal adhesions (postprocedural) (postinfection): Secondary | ICD-10-CM | POA: Diagnosis not present

## 2022-07-25 DIAGNOSIS — D251 Intramural leiomyoma of uterus: Secondary | ICD-10-CM | POA: Diagnosis not present

## 2022-07-25 DIAGNOSIS — N83291 Other ovarian cyst, right side: Secondary | ICD-10-CM | POA: Insufficient documentation

## 2022-07-25 DIAGNOSIS — E871 Hypo-osmolality and hyponatremia: Secondary | ICD-10-CM | POA: Diagnosis not present

## 2022-07-25 DIAGNOSIS — N83201 Unspecified ovarian cyst, right side: Secondary | ICD-10-CM | POA: Diagnosis not present

## 2022-07-25 DIAGNOSIS — N8501 Benign endometrial hyperplasia: Secondary | ICD-10-CM | POA: Diagnosis not present

## 2022-07-25 DIAGNOSIS — E878 Other disorders of electrolyte and fluid balance, not elsewhere classified: Secondary | ICD-10-CM | POA: Diagnosis not present

## 2022-07-25 DIAGNOSIS — N736 Female pelvic peritoneal adhesions (postinfective): Secondary | ICD-10-CM | POA: Diagnosis present

## 2022-07-25 DIAGNOSIS — Z9049 Acquired absence of other specified parts of digestive tract: Secondary | ICD-10-CM | POA: Diagnosis not present

## 2022-07-25 DIAGNOSIS — Z7984 Long term (current) use of oral hypoglycemic drugs: Secondary | ICD-10-CM | POA: Diagnosis not present

## 2022-07-25 HISTORY — PX: CYSTOSCOPY: SHX5120

## 2022-07-25 HISTORY — PX: ROBOTIC ASSISTED SALPINGO OOPHERECTOMY: SHX6082

## 2022-07-25 HISTORY — PX: ROBOTIC ASSISTED TOTAL HYSTERECTOMY: SHX6085

## 2022-07-25 LAB — GLUCOSE, CAPILLARY
Glucose-Capillary: 147 mg/dL — ABNORMAL HIGH (ref 70–99)
Glucose-Capillary: 168 mg/dL — ABNORMAL HIGH (ref 70–99)
Glucose-Capillary: 193 mg/dL — ABNORMAL HIGH (ref 70–99)
Glucose-Capillary: 185 mg/dL — ABNORMAL HIGH (ref 70–99)

## 2022-07-25 LAB — TYPE AND SCREEN
ABO/RH(D): O POS
Antibody Screen: NEGATIVE

## 2022-07-25 SURGERY — HYSTERECTOMY, TOTAL, ROBOT-ASSISTED
Anesthesia: General | Laterality: Right

## 2022-07-25 MED ORDER — LIDOCAINE HCL (PF) 2 % IJ SOLN
INTRAMUSCULAR | Status: AC
  Filled 2022-07-25: qty 5

## 2022-07-25 MED ORDER — LACTATED RINGERS IV SOLN: INTRAVENOUS | Status: DC | PRN

## 2022-07-25 MED ORDER — SUGAMMADEX SODIUM 500 MG/5ML IV SOLN
INTRAVENOUS | Status: DC | PRN
  Administered 2022-07-25: 250 mg via INTRAVENOUS

## 2022-07-25 MED ORDER — ONDANSETRON HCL 4 MG/2ML IJ SOLN: 4.0000 mg | Freq: Four times a day (QID) | INTRAMUSCULAR | Status: DC | PRN

## 2022-07-25 MED ORDER — DEXAMETHASONE SODIUM PHOSPHATE 10 MG/ML IJ SOLN
INTRAMUSCULAR | Status: AC
  Filled 2022-07-25: qty 1

## 2022-07-25 MED ORDER — LEVONORGESTREL 20 MCG/DAY IU IUD
1.0000 | INTRAUTERINE_SYSTEM | INTRAUTERINE | Status: DC
  Filled 2022-07-25: qty 1

## 2022-07-25 MED ORDER — PROPOFOL 10 MG/ML IV BOLUS
INTRAVENOUS | Status: AC
  Filled 2022-07-25: qty 20

## 2022-07-25 MED ORDER — STERILE WATER FOR INJECTION IJ SOLN
INTRAMUSCULAR | Status: AC
  Filled 2022-07-25: qty 10

## 2022-07-25 MED ORDER — DEXAMETHASONE SODIUM PHOSPHATE 10 MG/ML IJ SOLN
INTRAMUSCULAR | Status: DC | PRN
  Administered 2022-07-25: 8 mg via INTRAVENOUS

## 2022-07-25 MED ORDER — FENTANYL CITRATE (PF) 100 MCG/2ML IJ SOLN
INTRAMUSCULAR | Status: AC
  Filled 2022-07-25: qty 2

## 2022-07-25 MED ORDER — ONDANSETRON HCL 4 MG/2ML IJ SOLN
INTRAMUSCULAR | Status: AC
  Filled 2022-07-25: qty 2

## 2022-07-25 MED ORDER — HEPARIN SODIUM (PORCINE) 5000 UNIT/ML IJ SOLN
5000.0000 [IU] | INTRAMUSCULAR | Status: AC
  Administered 2022-07-25: 5000 [IU] via SUBCUTANEOUS
  Filled 2022-07-25: qty 1

## 2022-07-25 MED ORDER — LACTATED RINGERS IR SOLN
Status: DC | PRN
  Administered 2022-07-25: 1000 mL

## 2022-07-25 MED ORDER — ONDANSETRON HCL 4 MG/2ML IJ SOLN: 4.0000 mg | Freq: Once | INTRAMUSCULAR | Status: DC | PRN

## 2022-07-25 MED ORDER — IBUPROFEN 400 MG PO TABS
600.0000 mg | ORAL_TABLET | Freq: Three times a day (TID) | ORAL | Status: DC
  Administered 2022-07-25 – 2022-07-26 (×4): 600 mg via ORAL
  Filled 2022-07-25 (×4): qty 1

## 2022-07-25 MED ORDER — CHLORHEXIDINE GLUCONATE 0.12 % MT SOLN
15.0000 mL | Freq: Once | OROMUCOSAL | Status: AC
  Administered 2022-07-25: 15 mL via OROMUCOSAL

## 2022-07-25 MED ORDER — STERILE WATER FOR INJECTION IJ SOLN
INTRAMUSCULAR | Status: DC | PRN
  Administered 2022-07-25: 4 mL via INTRAMUSCULAR

## 2022-07-25 MED ORDER — ENSURE PRE-SURGERY PO LIQD: 296.0000 mL | Freq: Once | ORAL | Status: DC

## 2022-07-25 MED ORDER — LACTATED RINGERS IV SOLN: INTRAVENOUS | Status: DC

## 2022-07-25 MED ORDER — INSULIN ASPART 100 UNIT/ML IJ SOLN
0.0000 [IU] | Freq: Three times a day (TID) | INTRAMUSCULAR | Status: DC
  Administered 2022-07-25 – 2022-07-26 (×2): 3 [IU] via SUBCUTANEOUS
  Administered 2022-07-26: 2 [IU] via SUBCUTANEOUS

## 2022-07-25 MED ORDER — ACETAMINOPHEN 500 MG PO TABS
1000.0000 mg | ORAL_TABLET | ORAL | Status: AC
  Administered 2022-07-25: 1000 mg via ORAL
  Filled 2022-07-25: qty 2

## 2022-07-25 MED ORDER — OXYCODONE HCL 5 MG PO TABS: 5.0000 mg | ORAL_TABLET | Freq: Once | ORAL | Status: DC | PRN

## 2022-07-25 MED ORDER — ONDANSETRON HCL 4 MG PO TABS: 4.0000 mg | ORAL_TABLET | Freq: Four times a day (QID) | ORAL | Status: DC | PRN

## 2022-07-25 MED ORDER — ENOXAPARIN SODIUM 40 MG/0.4ML IJ SOSY
40.0000 mg | PREFILLED_SYRINGE | INTRAMUSCULAR | Status: DC
  Administered 2022-07-26: 40 mg via SUBCUTANEOUS
  Filled 2022-07-25: qty 0.4

## 2022-07-25 MED ORDER — ROCURONIUM BROMIDE 10 MG/ML (PF) SYRINGE
PREFILLED_SYRINGE | INTRAVENOUS | Status: DC | PRN
  Administered 2022-07-25: 10 mg via INTRAVENOUS
  Administered 2022-07-25: 20 mg via INTRAVENOUS
  Administered 2022-07-25: 60 mg via INTRAVENOUS

## 2022-07-25 MED ORDER — FENTANYL CITRATE PF 50 MCG/ML IJ SOSY
PREFILLED_SYRINGE | INTRAMUSCULAR | Status: AC
  Filled 2022-07-25: qty 2

## 2022-07-25 MED ORDER — HYDROCODONE-ACETAMINOPHEN 5-325 MG PO TABS
1.0000 | ORAL_TABLET | ORAL | Status: DC | PRN
  Administered 2022-07-25: 2 via ORAL
  Filled 2022-07-25: qty 2

## 2022-07-25 MED ORDER — CEFAZOLIN IN SODIUM CHLORIDE 3-0.9 GM/100ML-% IV SOLN
INTRAVENOUS | Status: AC
  Filled 2022-07-25: qty 100

## 2022-07-25 MED ORDER — LIDOCAINE 2% (20 MG/ML) 5 ML SYRINGE
INTRAMUSCULAR | Status: DC | PRN
  Administered 2022-07-25: 80 mg via INTRAVENOUS

## 2022-07-25 MED ORDER — OXYCODONE HCL 5 MG/5ML PO SOLN: 5.0000 mg | Freq: Once | ORAL | Status: DC | PRN

## 2022-07-25 MED ORDER — ACETAMINOPHEN 500 MG PO TABS
1000.0000 mg | ORAL_TABLET | Freq: Three times a day (TID) | ORAL | Status: DC
  Administered 2022-07-25 – 2022-07-26 (×5): 1000 mg via ORAL
  Filled 2022-07-25 (×5): qty 2

## 2022-07-25 MED ORDER — PROPOFOL 10 MG/ML IV BOLUS
INTRAVENOUS | Status: DC | PRN
  Administered 2022-07-25: 15 mg via INTRAVENOUS

## 2022-07-25 MED ORDER — KCL IN DEXTROSE-NACL 10-5-0.45 MEQ/L-%-% IV SOLN
INTRAVENOUS | Status: DC
  Filled 2022-07-25 (×4): qty 1000

## 2022-07-25 MED ORDER — HYDROMORPHONE HCL 1 MG/ML IJ SOLN
0.2000 mg | INTRAMUSCULAR | Status: DC | PRN
  Administered 2022-07-25: 0.5 mg via INTRAVENOUS
  Administered 2022-07-25: 0.6 mg via INTRAVENOUS
  Filled 2022-07-25 (×2): qty 1

## 2022-07-25 MED ORDER — ONDANSETRON HCL 4 MG/2ML IJ SOLN
INTRAMUSCULAR | Status: DC | PRN
  Administered 2022-07-25: 4 mg via INTRAVENOUS

## 2022-07-25 MED ORDER — CEFAZOLIN IN SODIUM CHLORIDE 3-0.9 GM/100ML-% IV SOLN
3.0000 g | INTRAVENOUS | Status: AC
  Administered 2022-07-25: 3 g via INTRAVENOUS
  Filled 2022-07-25: qty 100

## 2022-07-25 MED ORDER — ORAL CARE MOUTH RINSE: 15.0000 mL | Freq: Once | OROMUCOSAL | Status: AC

## 2022-07-25 MED ORDER — HEMOSTATIC AGENTS (NO CHARGE) OPTIME
TOPICAL | Status: DC | PRN
  Administered 2022-07-25: 1 via TOPICAL

## 2022-07-25 MED ORDER — TRAMADOL HCL 50 MG PO TABS: 50.0000 mg | ORAL_TABLET | Freq: Four times a day (QID) | ORAL | Status: DC | PRN

## 2022-07-25 MED ORDER — SENNOSIDES-DOCUSATE SODIUM 8.6-50 MG PO TABS
2.0000 | ORAL_TABLET | Freq: Every day | ORAL | Status: DC
  Administered 2022-07-25: 2 via ORAL
  Filled 2022-07-25: qty 2

## 2022-07-25 MED ORDER — HYDRALAZINE HCL 20 MG/ML IJ SOLN
INTRAMUSCULAR | Status: AC
  Filled 2022-07-25: qty 1

## 2022-07-25 MED ORDER — ROCURONIUM BROMIDE 10 MG/ML (PF) SYRINGE
PREFILLED_SYRINGE | INTRAVENOUS | Status: AC
  Filled 2022-07-25: qty 10

## 2022-07-25 MED ORDER — HYDRALAZINE HCL 20 MG/ML IJ SOLN
INTRAMUSCULAR | Status: DC | PRN
  Administered 2022-07-25: 2 mg via INTRAVENOUS

## 2022-07-25 MED ORDER — ACETAMINOPHEN 500 MG PO TABS: 1000.0000 mg | ORAL_TABLET | Freq: Once | ORAL | Status: DC

## 2022-07-25 MED ORDER — MIDAZOLAM HCL 5 MG/5ML IJ SOLN
INTRAMUSCULAR | Status: DC | PRN
  Administered 2022-07-25: 2 mg via INTRAVENOUS

## 2022-07-25 MED ORDER — MIDAZOLAM HCL 2 MG/2ML IJ SOLN
INTRAMUSCULAR | Status: AC
  Filled 2022-07-25: qty 2

## 2022-07-25 MED ORDER — BUPIVACAINE HCL 0.25 % IJ SOLN
INTRAMUSCULAR | Status: AC
  Filled 2022-07-25: qty 1

## 2022-07-25 MED ORDER — ENSURE PRE-SURGERY PO LIQD: 592.0000 mL | Freq: Once | ORAL | Status: DC

## 2022-07-25 MED ORDER — BUPIVACAINE HCL 0.25 % IJ SOLN
INTRAMUSCULAR | Status: DC | PRN
  Administered 2022-07-25: 38 mL

## 2022-07-25 MED ORDER — FENTANYL CITRATE PF 50 MCG/ML IJ SOSY
25.0000 ug | PREFILLED_SYRINGE | INTRAMUSCULAR | Status: DC | PRN
  Administered 2022-07-25 (×2): 50 ug via INTRAVENOUS

## 2022-07-25 MED ORDER — FENTANYL CITRATE (PF) 100 MCG/2ML IJ SOLN
INTRAMUSCULAR | Status: DC | PRN
  Administered 2022-07-25: 50 ug via INTRAVENOUS
  Administered 2022-07-25: 100 ug via INTRAVENOUS
  Administered 2022-07-25 (×3): 50 ug via INTRAVENOUS

## 2022-07-25 MED ORDER — DEXAMETHASONE SODIUM PHOSPHATE 4 MG/ML IJ SOLN: 4.0000 mg | INTRAMUSCULAR | Status: DC

## 2022-07-25 MED ORDER — STERILE WATER FOR IRRIGATION IR SOLN
Status: DC | PRN
  Administered 2022-07-25: 1000 mL

## 2022-07-25 SURGICAL SUPPLY — 95 items
APPLICATOR ARISTA FLEXITIP XL (MISCELLANEOUS) IMPLANT
APPLICATOR SURGIFLO ENDO (HEMOSTASIS) IMPLANT
BAG COUNTER SPONGE SURGICOUNT (BAG) ×3 IMPLANT
BAG LAPAROSCOPIC 12 15 PORT 16 (BASKET) IMPLANT
BAG RETRIEVAL 12/15 (BASKET)
BLADE SURG SZ10 CARB STEEL (BLADE) IMPLANT
COVER BACK TABLE 60X90IN (DRAPES) ×3 IMPLANT
COVER TIP SHEARS 8 DVNC (MISCELLANEOUS) ×3 IMPLANT
COVER TIP SHEARS 8MM DA VINCI (MISCELLANEOUS) ×3
DERMABOND ADVANCED .7 DNX12 (GAUZE/BANDAGES/DRESSINGS) ×3 IMPLANT
DEVICE MYOSURE LITE (MISCELLANEOUS) IMPLANT
DEVICE MYOSURE REACH (MISCELLANEOUS) IMPLANT
DILATOR CANAL MILEX (MISCELLANEOUS) IMPLANT
DRAPE ARM DVNC X/XI (DISPOSABLE) ×12 IMPLANT
DRAPE COLUMN DVNC XI (DISPOSABLE) ×3 IMPLANT
DRAPE DA VINCI XI ARM (DISPOSABLE) ×12
DRAPE DA VINCI XI COLUMN (DISPOSABLE) ×3
DRAPE SHEET LG 3/4 BI-LAMINATE (DRAPES) ×3 IMPLANT
DRAPE SURG IRRIG POUCH 19X23 (DRAPES) ×3 IMPLANT
DRSG OPSITE POSTOP 4X6 (GAUZE/BANDAGES/DRESSINGS) IMPLANT
DRSG OPSITE POSTOP 4X8 (GAUZE/BANDAGES/DRESSINGS) IMPLANT
ELECT PENCIL ROCKER SW 15FT (MISCELLANEOUS) IMPLANT
ELECT REM PT RETURN 15FT ADLT (MISCELLANEOUS) ×3 IMPLANT
GAUZE 4X4 16PLY ~~LOC~~+RFID DBL (SPONGE) ×6 IMPLANT
GLOVE BIO SURGEON STRL SZ 6 (GLOVE) ×12 IMPLANT
GLOVE BIO SURGEON STRL SZ 6.5 (GLOVE) ×3 IMPLANT
GOWN STRL REUS W/ TWL LRG LVL3 (GOWN DISPOSABLE) ×12 IMPLANT
GOWN STRL REUS W/TWL LRG LVL3 (GOWN DISPOSABLE) ×12
GRASPER SUT TROCAR 14GX15 (MISCELLANEOUS) IMPLANT
HEMOSTAT ARISTA ABSORB 3G PWDR (HEMOSTASIS) IMPLANT
HIBICLENS CHG 4% 4OZ BTL (MISCELLANEOUS) ×6 IMPLANT
HOLDER FOLEY CATH W/STRAP (MISCELLANEOUS) IMPLANT
IRRIG SUCT STRYKERFLOW 2 WTIP (MISCELLANEOUS) ×3
IRRIGATION SUCT STRKRFLW 2 WTP (MISCELLANEOUS) ×3 IMPLANT
IV NS IRRIG 3000ML ARTHROMATIC (IV SOLUTION) ×3 IMPLANT
KIT PROCEDURE DA VINCI SI (MISCELLANEOUS) ×3
KIT PROCEDURE DVNC SI (MISCELLANEOUS) IMPLANT
KIT PROCEDURE FLUENT (KITS) IMPLANT
KIT SIGMOIDOSCOPE (SET/KITS/TRAYS/PACK) IMPLANT
KIT TURNOVER KIT A (KITS) IMPLANT
LIGASURE IMPACT 36 18CM CVD LR (INSTRUMENTS) IMPLANT
LOOP CUTTING BIPOLAR 21FR (ELECTRODE) IMPLANT
LUBRICATING JELLY 2OZ (MISCELLANEOUS) IMPLANT
MANIPULATOR ADVINCU DEL 3.0 PL (MISCELLANEOUS) IMPLANT
MANIPULATOR ADVINCU DEL 3.5 PL (MISCELLANEOUS) IMPLANT
MANIPULATOR UTERINE 4.5 ZUMI (MISCELLANEOUS) IMPLANT
MYOSURE XL FIBROID (MISCELLANEOUS)
NDL HYPO 21X1.5 SAFETY (NEEDLE) ×3 IMPLANT
NDL SPNL 18GX3.5 QUINCKE PK (NEEDLE) IMPLANT
NEEDLE HYPO 21X1.5 SAFETY (NEEDLE) ×3 IMPLANT
NEEDLE SPNL 18GX3.5 QUINCKE PK (NEEDLE) ×3 IMPLANT
OBTURATOR OPTICAL STANDARD 8MM (TROCAR) ×3
OBTURATOR OPTICAL STND 8 DVNC (TROCAR) ×3
OBTURATOR OPTICALSTD 8 DVNC (TROCAR) ×3 IMPLANT
PACK ROBOT GYN CUSTOM WL (TRAY / TRAY PROCEDURE) ×3 IMPLANT
PACK VAGINAL WOMENS (CUSTOM PROCEDURE TRAY) ×3 IMPLANT
PAD OB MATERNITY 4.3X12.25 (PERSONAL CARE ITEMS) IMPLANT
PAD POSITIONING PINK XL (MISCELLANEOUS) ×3 IMPLANT
PORT ACCESS TROCAR AIRSEAL 12 (TROCAR) IMPLANT
RETRACTOR LAPSCP 12X46 CVD (ENDOMECHANICALS) IMPLANT
RTRCTR LAPSCP 12X46 CVD (ENDOMECHANICALS) ×3
SCRUB CHG 4% DYNA-HEX 4OZ (MISCELLANEOUS) IMPLANT
SEAL CANN UNIV 5-8 DVNC XI (MISCELLANEOUS) ×9 IMPLANT
SEAL ROD LENS SCOPE MYOSURE (ABLATOR) IMPLANT
SEAL XI 5MM-8MM UNIVERSAL (MISCELLANEOUS) ×12
SET TRI-LUMEN FLTR TB AIRSEAL (TUBING) ×3 IMPLANT
SPIKE FLUID TRANSFER (MISCELLANEOUS) ×3 IMPLANT
SPONGE T-LAP 18X18 ~~LOC~~+RFID (SPONGE) IMPLANT
SURGIFLO W/THROMBIN 8M KIT (HEMOSTASIS) IMPLANT
SUT MNCRL AB 4-0 PS2 18 (SUTURE) IMPLANT
SUT PDS AB 1 TP1 96 (SUTURE) IMPLANT
SUT V-LOC 180 0-0 GS22 (SUTURE) IMPLANT
SUT VIC AB 0 CT1 27 (SUTURE)
SUT VIC AB 0 CT1 27XBRD ANTBC (SUTURE) IMPLANT
SUT VIC AB 2-0 CT1 27 (SUTURE)
SUT VIC AB 2-0 CT1 TAPERPNT 27 (SUTURE) IMPLANT
SUT VIC AB 4-0 PS2 18 (SUTURE) ×6 IMPLANT
SUT VICRYL 0 27 CT2 27 ABS (SUTURE) ×3 IMPLANT
SUT VLOC 180 0 9IN  GS21 (SUTURE)
SUT VLOC 180 0 9IN GS21 (SUTURE) IMPLANT
SYR 10ML LL (SYRINGE) IMPLANT
SYS BAG RETRIEVAL 10MM (BASKET) ×3
SYS WOUND ALEXIS 18CM MED (MISCELLANEOUS)
SYSTEM BAG RETRIEVAL 10MM (BASKET) IMPLANT
SYSTEM TISS REMOVAL MYOSURE XL (MISCELLANEOUS) IMPLANT
SYSTEM WOUND ALEXIS 18CM MED (MISCELLANEOUS) IMPLANT
TOWEL OR 17X26 10 PK STRL BLUE (TOWEL DISPOSABLE) ×3 IMPLANT
TOWEL OR NON WOVEN STRL DISP B (DISPOSABLE) IMPLANT
TRAP SPECIMEN MUCUS 40CC (MISCELLANEOUS) IMPLANT
TRAY FOLEY MTR SLVR 16FR STAT (SET/KITS/TRAYS/PACK) ×3 IMPLANT
TROCAR PORT AIRSEAL 5X120 (TROCAR) IMPLANT
UNDERPAD 30X36 HEAVY ABSORB (UNDERPADS AND DIAPERS) ×6 IMPLANT
WATER STERILE IRR 1000ML POUR (IV SOLUTION) ×3 IMPLANT
WATER STERILE IRR 500ML POUR (IV SOLUTION) ×3 IMPLANT
YANKAUER SUCT BULB TIP 10FT TU (MISCELLANEOUS) IMPLANT

## 2022-07-25 NOTE — Transfer of Care (Signed)
Immediate Anesthesia Transfer of Care Note  Patient: Frances Gray  Procedure(s) Performed: XI ROBOTIC ASSISTED TOTAL HYSTERECTOMY;LYSIS OF ADHESIONS XI ROBOTIC ASSISTED UNILATERAL SALPINGO OOPHORECTOMY (Right) CYSTOSCOPY  Patient Location: PACU  Anesthesia Type:General  Level of Consciousness: drowsy  Airway & Oxygen Therapy: Patient Spontanous Breathing and Patient connected to face mask oxygen  Post-op Assessment: Report given to RN, Post -op Vital signs reviewed and stable, and Patient moving all extremities X 4  Post vital signs: Reviewed and stable  Last Vitals:  Vitals Value Taken Time  BP 153/89   Temp    Pulse 62 07/25/22 1052  Resp 19 07/25/22 1052  SpO2 98 % 07/25/22 1052  Vitals shown include unvalidated device data.  Last Pain:  Vitals:   07/25/22 0551  TempSrc: Oral         Complications:  Encounter Notable Events  Notable Event Outcome Phase Comment  Difficult to intubate - expected  Intraprocedure Filed from anesthesia note documentation.

## 2022-07-25 NOTE — Anesthesia Procedure Notes (Signed)
Procedure Name: Intubation Date/Time: 07/25/2022 7:27 AM  Performed by: Audry Pili, MDPre-anesthesia Checklist: Patient identified, Emergency Drugs available, Suction available and Patient being monitored Patient Re-evaluated:Patient Re-evaluated prior to induction Oxygen Delivery Method: Circle system utilized Preoxygenation: Pre-oxygenation with 100% oxygen Induction Type: IV induction Ventilation: Mask ventilation without difficulty Laryngoscope Size: Glidescope and 3 Tube type: Oral Number of attempts: 3 Airway Equipment and Method: Video-laryngoscopy and Rigid stylet Placement Confirmation: ETT inserted through vocal cords under direct vision, positive ETCO2 and breath sounds checked- equal and bilateral Secured at: 21 cm Tube secured with: Tape Dental Injury: Teeth and Oropharynx as per pre-operative assessment  Difficulty Due To: Difficulty was anticipated and Difficult Airway- due to anterior larynx Comments: First look by CRNA with Mac 3, grade 3 view. 2nd look by MD with Mil 2, grade 3 view. Easy masking in between attempts. 3rd look with glidescope 3, grade 1 view.

## 2022-07-25 NOTE — Op Note (Signed)
OPERATIVE NOTE  Pre-operative Diagnosis: EIN  Post-operative Diagnosis: same, on frozen section no discrete lesion seen, significant pelvic adhesive disease.  Operation: Robotic-assisted laparoscopic total hysterectomy with right salpingo-oophorectomy, lysis of adhesions and enterolysis for approximately 1.5 hours Extreme morbid obesity requiring additional OR personnel for positioning and retraction. Obesity made retroperitoneal visualization limited and increased the complexity of the case and necessitated additional instrumentation for retraction. Obesity related complexity increased the duration of the procedure by 45 minutes.   Surgeon: Jeral Pinch MD  Assistant Surgeon: Joylene John NP  Anesthesia: GET  Urine Output: 400 cc  Operative Findings:  Enlarged, moderately mobile uterus on EUA. On intra-abdominal entry, normal upper abdominal survey. Some adhesions between the right anterior liver and the abdominal wall. Some adhesions of the omentum to the anterior abdominal wall below the level of the umbilicus. Multiple loops of sigmoid colon densely adherent to the fundal and posterior uterus.  Filmy adhesions of the colon to the right adnexa and to the left pelvic sidewall. Some thick adhesions between sigmoid colon and posterior uterus. Adhesions between the bladder and LUS/cervix. 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 Uterus 10 cm and bulbous at the fundus. Left adnexa surgically absent. Normal appearing right tube and ovary. Green dye noted within the parametrium on the right without any obvious SLN. No dye seen on the left.  Uterus sent for frozen section with adenomyosis noted. No obvious endometrial lesion. On cystoscopy, intact dome, normal bilateral ureteral efflux.   Estimated Blood Loss:  125 cc      Total IV Fluids: see I&O flowsheet         Specimens: uterus, cervix, right tube and ovary          Complications:  None apparent; patient tolerated the procedure well.         Disposition: PACU - hemodynamically stable.  Procedure Details  The patient was seen in the Holding Room. The risks, benefits, complications, treatment options, and expected outcomes were discussed with the patient.  The patient concurred with the proposed plan, giving informed consent.  The site of surgery properly noted/marked. The patient was identified as Frances Gray and the procedure verified as a Robotic-assisted hysterectomy with bilateral salpingo oophorectomy with SLN biopsy.   After induction of anesthesia, the patient was draped and prepped in the usual sterile manner. Patient was placed in supine position after anesthesia and draped and prepped in the usual sterile manner as follows: Her arms were tucked to her side with all appropriate precautions.  The patient was secured to the bed using padding and tape across her chest.  The patient was placed in the semi-lithotomy position in [place].  The perineum and vagina were prepped with Chlorhexidene. The patient's abdomen was prepped with ChloraPrep and she was draped after the prep had been allowed to dry for 3 minutes.  A Time Out was held and the above information confirmed.  The urethra was prepped with Betadine. Foley catheter was placed.  A sterile speculum was placed in the vagina.  The cervix was grasped with a single-tooth tenaculum. 71m total of ICG was injected into the cervical stroma at 2 and 9 o'clock with 1cc injected at a 1cm and 270mdepth (concentration 0.4m58ml) in all locations. The cervix was dilated with PraKennon Roundslators.  The 3.5 Delineator uterine manipulator with a colpotomizer ring was placed without difficulty.  A pneum occluder balloon was placed over the manipulator.  OG  tube placement was confirmed and to suction.   Next, a 10 mm skin incision was made 1 cm below the subcostal margin in the midclavicular line.  The 5 mm Optiview  port and scope was used for direct entry.  Opening pressure was under 10 mm CO2.  The abdomen was insufflated and the findings were noted as above.   At this point and all points during the procedure, the patient's intra-abdominal pressure did not exceed 15 mmHg. Next, an 8 mm skin incision was made superior to the umbilicus and a right and left port were placed about 8 cm lateral to the robot port on the right and left side.  A fourth arm was placed on the right.  The 5 mm assist trocar was exchanged for a 10-12 mm port. All ports were placed under direct visualization.  The patient was placed in steep Trendelenburg. The robot was docked in the normal manner.  Attention was first turned to the omentum.  The combination of monopolar and bipolar electrocautery were used to cauterize and lyse adhesions of the distal omentum to the anterior abdominal wall.  Once the omentum was freed, it was positioned in the upper abdomen.  Attention was then turned to the colon.  With anterior traction on the uterus using the manipulator, meticulous sharp dissection was used to lyse adhesions of the sigmoid and sigmoid mesentery to the fundal and posterior aspect of the uterus.  There were multiple loops of sigmoid adherent posteriorly.  Also several pockets that had almost developed into peritoneal inclusions secondary to prior surgical history and diverticular disease.  The sigmoid colon was mobilized from the left pelvic sidewall.  The adhesions between the sigmoid and right adnexa were also lysed sharply.  Ultimately, the sigmoid was freed from the posterior uterus.  The right and left peritoneum were opened parallel to the IP ligament to open the retroperitoneal spaces bilaterally. The round ligaments were transected. The SLN mapping was performed in bilateral pelvic basins. ON the right, the ureter was identified, and the para rectal and paravesical spaces were opened up entirely with careful dissection below the level of  the ureters bilaterally and to the depth of the uterine artery origin in order to skeletonize the uterine "web" and ensure visualization of all parametrial channels. The para-aortic basins were carefully exposed and evaluated for isolated para-aortic SLN's. No sentinel lymph node was seen on the right.  On the left, given degree of retroperitoneal fibrosis, it was quite challenging to identify the ureter retroperitoneally. No green dye was noted within the retroperitoneal spaces.  The decision was made to proceed with hysterectomy and send the uterus for frozen section.  The hysterectomy was started.  The ureter was again noted to be on the medial leaf of the broad ligament on the right.  The peritoneum above the ureter was incised and stretched and the infundibulopelvic ligament was skeletonized, cauterized and cut.  The posterior peritoneum was taken down to the level of the KOH ring.  The anterior peritoneum was also taken down.  The bladder flap was created to the level of the KOH ring.  Some adhesions were noted between the bladder and lower uterine segment.  The uterine artery on the right side was skeletonized, cauterized and cut in the normal manner.  A similar procedure was performed on the left although given prior removal of the left adnexa, the peritoneum was simply incised lateral to the uterine fundus and taken down posteriorly to the level of the  KOH ring.  The colpotomy was made and the uterus, cervix, bilateral ovaries and tubes were amputated and delivered through the vagina.  Pedicles were inspected and excellent hemostasis was achieved.    The colpotomy at the vaginal cuff was closed with 0 Vicryl with a figure of eight at each apex and 0 V-Loc to close the midportion of the cuff in running manner.  Irrigation was used and excellent hemostasis was achieved.    Given difficulty with visualization of the left ureter, as well as somewhat complex bladder dissection anteriorly, cystoscopy was  performed after the bladder was backfilled with 200 cc of sterile fluid.  Findings as noted above.  New Foley catheter was then placed.  1 instrument was removed and table motion was performed to place the patient in 15 degrees of Trendelenburg.  Sterile fluid was then used to fill the pelvis and pressure placed using a robotic instrument on the sigmoid colon to occlude it.  Rectal exam was then performed.  Proctoscope was placed within the rectum and insufflation performed to complete a bubble test.  No evidence of leak was noted.  The scope was removed from the rectum.  The pelvis was irrigated again.  Given some raw peritoneal and mesenteric surfaces, Arista was placed within the pelvis along the operative bed.  At this point in the procedure was completed.  Robotic instruments were removed under direct visulaization.  The robot was undocked. The fascia at the 10-12 mm port was closed with 0 Vicryl using a PMI fascial closure device.  The subcuticular tissue was closed with 4-0 Vicryl and the skin was closed with 4-0 Monocryl in a subcuticular manner.  Dermabond was applied.    The vagina was swabbed with  minimal bleeding noted. All sponge, lap and needle counts were correct x  3.   The patient was transferred to the recovery room in stable condition.  Jeral Pinch, MD

## 2022-07-25 NOTE — Interval H&P Note (Signed)
History and Physical Interval Note:  07/25/2022 6:46 AM  Frances Gray  has presented today for surgery, with the diagnosis of EIN.  The various methods of treatment have been discussed with the patient and family. After consideration of risks, benefits and other options for treatment, the patient has consented to  Procedure(s): XI ROBOTIC ASSISTED TOTAL HYSTERECTOMY;POSSIBLE LAPAROTOMY (N/A) XI ROBOTIC ASSISTED UNILATERAL SALPINGO OOPHORECTOMY (N/A) SENTINEL NODE BIOPSY (N/A) POSSIBLE LYMPH NODE DISSECTION (N/A) POSSIBLE DILATATION AND CURETTAGE /HYSTEROSCOPY (N/A) POSSIBLE INTRAUTERINE DEVICE (IUD) INSERTION (N/A) as a surgical intervention.  The patient's history has been reviewed, patient examined, no change in status, stable for surgery.  I have reviewed the patient's chart and labs.  Questions were answered to the patient's satisfaction.     Lafonda Mosses

## 2022-07-25 NOTE — Discharge Instructions (Addendum)
AFTER SURGERY INSTRUCTIONS   Return to work: 4-6 weeks if applicable  You need to be on an aggressive bowel regimen. You can take something at least once a day, ideally twice daily. You can take miralax in the am and sennakot-S in the pm.   You have been prescribed potassium to take twice daily for the next three days. We will plan for you to have a lab appt at the end of next week to recheck your labs.   Activity: 1. Be up and out of the bed during the day.  Take a nap if needed.  You may walk up steps but be careful and use the hand rail.  Stair climbing will tire you more than you think, you may need to stop part way and rest.    2. No lifting or straining for 6 weeks over 10 pounds. No pushing, pulling, straining for 6 weeks.   3. No driving for around 1 week(s).  Do not drive if you are taking narcotic pain medicine and make sure that your reaction time has returned.    4. You can shower as soon as the next day after surgery. Shower daily.  Use your regular soap and water (not directly on the incision) and pat your incision(s) dry afterwards; don't rub.  No tub baths or submerging your body in water until cleared by your surgeon. If you have the soap that was given to you by pre-surgical testing that was used before surgery, you do not need to use it afterwards because this can irritate your incisions.    5. No sexual activity and nothing in the vagina for 10-12 weeks.   6. You may experience a small amount of clear drainage from your incisions, which is normal.  If the drainage persists, increases, or changes color please call the office.   7. Do not use creams, lotions, or ointments such as neosporin on your incisions after surgery until advised by your surgeon because they can cause removal of the dermabond glue on your incisions.     8. You may experience vaginal spotting after surgery or around the 6-8 week mark from surgery when the stitches at the top of the vagina begin to  dissolve.  The spotting is normal but if you experience heavy bleeding, call our office.   9. Take Tylenol or ibuprofen first for pain if you are able to take these medications and only use narcotic pain medication for severe pain not relieved by the Tylenol or Ibuprofen.  Monitor your Tylenol intake to a max of 4,000 mg in a 24 hour period. You can alternate these medications after surgery.   Diet: 1. Low sodium Heart Healthy Diet is recommended but you are cleared to resume your normal (before surgery) diet after your procedure.   2. It is safe to use a laxative, such as Miralax or Colace, if you have difficulty moving your bowels. You have been prescribed Sennakot-S to take at bedtime every evening after surgery to keep bowel movements regular and to prevent constipation.     Wound Care: 1. Keep clean and dry.  Shower daily.   Reasons to call the Doctor: Fever - Oral temperature greater than 100.4 degrees Fahrenheit Foul-smelling vaginal discharge Difficulty urinating Nausea and vomiting Increased pain at the site of the incision that is unrelieved with pain medicine. Difficulty breathing with or without chest pain New calf pain especially if only on one side Sudden, continuing increased vaginal bleeding with or without clots.  Contacts: For questions or concerns you should contact:   Dr. Jeral Pinch at [phone]   Joylene John, NP at [phone]   After Hours: call [phone] and have the GYN Oncologist paged/contacted (after 5 pm or on the weekends).   Messages sent via mychart are for non-urgent matters and are not responded to after hours so for urgent needs, please call the after hours number.

## 2022-07-25 NOTE — Anesthesia Postprocedure Evaluation (Signed)
Anesthesia Post Note  Patient: Frances Gray  Procedure(s) Performed: XI ROBOTIC ASSISTED TOTAL HYSTERECTOMY;LYSIS OF ADHESIONS XI ROBOTIC ASSISTED UNILATERAL SALPINGO OOPHORECTOMY (Right) CYSTOSCOPY     Patient location during evaluation: PACU Anesthesia Type: General Level of consciousness: awake and alert Pain management: pain level controlled Vital Signs Assessment: post-procedure vital signs reviewed and stable Respiratory status: spontaneous breathing, nonlabored ventilation and respiratory function stable Cardiovascular status: stable and blood pressure returned to baseline Anesthetic complications: yes   Encounter Notable Events  Notable Event Outcome Phase Comment  Difficult to intubate - expected  Intraprocedure Filed from anesthesia note documentation.    Last Vitals:  Vitals:   07/25/22 1130 07/25/22 1152  BP: (!) 147/72 (!) 163/87  Pulse: (!) 56 (!) 56  Resp: 13 18  Temp: (!) 36.3 C   SpO2: 100% 100%                   Audry Pili

## 2022-07-25 NOTE — Brief Op Note (Signed)
07/25/2022  10:47 AM  PATIENT:  Tamela Oddi  72 y.o. female  PRE-OPERATIVE DIAGNOSIS:  EIN  POST-OPERATIVE DIAGNOSIS:  EIN  PROCEDURE:  Procedure(s): XI ROBOTIC ASSISTED TOTAL HYSTERECTOMY;LYSIS OF ADHESIONS (N/A) XI ROBOTIC ASSISTED UNILATERAL SALPINGO OOPHORECTOMY (Right) CYSTOSCOPY  SURGEON:  Surgeon(s) and Role:    Lafonda Mosses, MD - Primary  ASSISTANTS: Joylene John, NP   ANESTHESIA:   general  EBL:  125 mL   BLOOD ADMINISTERED:none  DRAINS: none   LOCAL MEDICATIONS USED:  MARCAINE     SPECIMEN:  uterus, cervix, right tube and ovary  DISPOSITION OF SPECIMEN:  PATHOLOGY  COUNTS:  YES  TOURNIQUET:  * No tourniquets in log *  DICTATION: .Dragon Dictation and Note written in EPIC  PLAN OF CARE: Admit for overnight observation  PATIENT DISPOSITION:  PACU - hemodynamically stable.   Delay start of Pharmacological VTE agent (>24hrs) due to surgical blood loss or risk of bleeding: no

## 2022-07-26 ENCOUNTER — Other Ambulatory Visit: Payer: Self-pay | Admitting: Gynecologic Oncology

## 2022-07-26 ENCOUNTER — Encounter (HOSPITAL_COMMUNITY): Payer: Self-pay | Admitting: Gynecologic Oncology

## 2022-07-26 DIAGNOSIS — N8003 Adenomyosis of the uterus: Secondary | ICD-10-CM | POA: Diagnosis not present

## 2022-07-26 DIAGNOSIS — N83291 Other ovarian cyst, right side: Secondary | ICD-10-CM | POA: Diagnosis not present

## 2022-07-26 DIAGNOSIS — N836 Hematosalpinx: Secondary | ICD-10-CM | POA: Diagnosis not present

## 2022-07-26 DIAGNOSIS — D251 Intramural leiomyoma of uterus: Secondary | ICD-10-CM | POA: Diagnosis not present

## 2022-07-26 DIAGNOSIS — N8502 Endometrial intraepithelial neoplasia [EIN]: Secondary | ICD-10-CM

## 2022-07-26 DIAGNOSIS — E871 Hypo-osmolality and hyponatremia: Secondary | ICD-10-CM

## 2022-07-26 DIAGNOSIS — N736 Female pelvic peritoneal adhesions (postinfective): Secondary | ICD-10-CM | POA: Diagnosis not present

## 2022-07-26 DIAGNOSIS — N8501 Benign endometrial hyperplasia: Secondary | ICD-10-CM | POA: Diagnosis not present

## 2022-07-26 DIAGNOSIS — E876 Hypokalemia: Secondary | ICD-10-CM

## 2022-07-26 LAB — PHOSPHORUS: Phosphorus: 2.8 mg/dL (ref 2.5–4.6)

## 2022-07-26 LAB — CBC
HCT: 35 % — ABNORMAL LOW (ref 36.0–46.0)
Hemoglobin: 11.5 g/dL — ABNORMAL LOW (ref 12.0–15.0)
MCH: 28.3 pg (ref 26.0–34.0)
MCHC: 32.9 g/dL (ref 30.0–36.0)
MCV: 86.2 fL (ref 80.0–100.0)
Platelets: 346 10*3/uL (ref 150–400)
RBC: 4.06 MIL/uL (ref 3.87–5.11)
RDW: 13.5 % (ref 11.5–15.5)
WBC: 13 10*3/uL — ABNORMAL HIGH (ref 4.0–10.5)
nRBC: 0 % (ref 0.0–0.2)

## 2022-07-26 LAB — BASIC METABOLIC PANEL
Anion gap: 10 (ref 5–15)
Anion gap: 7 (ref 5–15)
BUN: 11 mg/dL (ref 8–23)
BUN: 11 mg/dL (ref 8–23)
CO2: 24 mmol/L (ref 22–32)
CO2: 26 mmol/L (ref 22–32)
Calcium: 8.5 mg/dL — ABNORMAL LOW (ref 8.9–10.3)
Calcium: 8.6 mg/dL — ABNORMAL LOW (ref 8.9–10.3)
Chloride: 97 mmol/L — ABNORMAL LOW (ref 98–111)
Chloride: 99 mmol/L (ref 98–111)
Creatinine, Ser: 0.74 mg/dL (ref 0.44–1.00)
Creatinine, Ser: 0.86 mg/dL (ref 0.44–1.00)
GFR, Estimated: >60 mL/min (ref >60)
GFR, Estimated: >60 mL/min (ref >60)
Glucose, Bld: 144 mg/dL — ABNORMAL HIGH (ref 70–99)
Glucose, Bld: 196 mg/dL — ABNORMAL HIGH (ref 70–99)
Potassium: 3 mmol/L — ABNORMAL LOW (ref 3.5–5.1)
Potassium: 3.2 mmol/L — ABNORMAL LOW (ref 3.5–5.1)
Sodium: 131 mmol/L — ABNORMAL LOW (ref 135–145)
Sodium: 132 mmol/L — ABNORMAL LOW (ref 135–145)

## 2022-07-26 LAB — GLUCOSE, CAPILLARY
Glucose-Capillary: 137 mg/dL — ABNORMAL HIGH (ref 70–99)
Glucose-Capillary: 181 mg/dL — ABNORMAL HIGH (ref 70–99)
Glucose-Capillary: 122 mg/dL — ABNORMAL HIGH (ref 70–99)

## 2022-07-26 LAB — HEMOGLOBIN AND HEMATOCRIT, BLOOD
HCT: 36.1 % (ref 36.0–46.0)
Hemoglobin: 11.7 g/dL — ABNORMAL LOW (ref 12.0–15.0)

## 2022-07-26 LAB — MAGNESIUM: Magnesium: 1.8 mg/dL (ref 1.7–2.4)

## 2022-07-26 LAB — SURGICAL PATHOLOGY

## 2022-07-26 MED ORDER — POTASSIUM CHLORIDE CRYS ER 10 MEQ PO TBCR: 10.0000 meq | EXTENDED_RELEASE_TABLET | Freq: Two times a day (BID) | ORAL | 0 refills | Status: AC

## 2022-07-26 MED ORDER — POLYETHYLENE GLYCOL 3350 17 G PO PACK: 17.0000 g | PACK | Freq: Every day | ORAL | 0 refills | Status: DC

## 2022-07-26 MED ORDER — POTASSIUM CHLORIDE CRYS ER 20 MEQ PO TBCR
20.0000 meq | EXTENDED_RELEASE_TABLET | Freq: Every day | ORAL | Status: DC
  Administered 2022-07-26: 20 meq via ORAL
  Filled 2022-07-26: qty 1

## 2022-07-26 MED ORDER — SENNOSIDES-DOCUSATE SODIUM 8.6-50 MG PO TABS
2.0000 | ORAL_TABLET | Freq: Every day | ORAL | Status: DC
  Administered 2022-07-26: 2 via ORAL
  Filled 2022-07-26: qty 2

## 2022-07-26 NOTE — Progress Notes (Signed)
Mobility Specialist - Progress Note   07/26/22 1318  Mobility  Activity Ambulated with assistance in hallway;Ambulated with assistance to bathroom  Level of Assistance Modified independent, requires aide device or extra time  Assistive Device Front wheel walker  Distance Ambulated (ft) 160 ft  Activity Response Tolerated well  Mobility Referral Yes  $Mobility charge 1 Mobility   Pt received in bed and agreeable to mobility. Prior to ambulating, pt requested assistance to bathroom. No complaints during session. Pt to bed after session with all needs met.   [facility]

## 2022-07-26 NOTE — Progress Notes (Signed)
Patient was given discharge instructions, and all questions were answered.  Patient was stable for discharge and was taken to the main exit by wheelchair. 

## 2022-07-26 NOTE — Progress Notes (Signed)
1 Day Post-Op Procedure(s) (LRB): XI ROBOTIC ASSISTED TOTAL HYSTERECTOMY;LYSIS OF ADHESIONS (N/A) XI ROBOTIC ASSISTED UNILATERAL SALPINGO OOPHORECTOMY (Right) CYSTOSCOPY  Subjective: Patient reports doing well.  Has some abdominal soreness treated with pain medication and ice.  Has ambulated multiple times.  Mild dizziness when getting up from sitting, otherwise denies any dizziness.  Some flatus.  Foley catheter was removed this morning, has not voided since.  Tolerated breakfast without nausea.  Objective: Vital signs in last 24 hours: Temp:  [97.2 F (36.2 C)-98.8 F (37.1 C)] 97.9 F (36.6 C) (02/16 0524) Pulse Rate:  [51-65] 52 (02/16 0524) Resp:  [13-19] 17 (02/16 0524) BP: (115-163)/(56-89) 115/59 (02/16 0524) SpO2:  [97 %-100 %] 97 % (02/16 0524)    Intake/Output from previous day: 02/15 0701 - 02/16 0700 In: 2722.8 [P.O.:750; I.V.:1872.8; IV Piggyback:100] Out: [id number] [Urine:1650; Stool:1; Blood:125]  Physical Examination: Gen: No acute distress, alert and oriented HEENT: Atraumatic, normocephalic Cardiovascular: Regular rate and rhythm, no murmurs rubs or gallops appreciated Pulmonary: Lungs are clear to auscultation bilaterally, no wheezes or rhonchi Abdomen: Soft, appropriately tender, nondistended, good bowel sounds.  Incisions are clean, dry, intact with Dermabond in place Extremities: Warm and well-perfused, no edema, SCDs in place although not plugged in currently  Labs:    Latest Ref Rng & Units 07/26/2022    4:59 AM 07/23/2022    9:59 AM 08/27/2021    8:23 AM  CBC  WBC 4.0 - 10.5 K/uL 13.0  6.7  10.7   Hemoglobin 12.0 - 15.0 g/dL 11.5  13.3  11.2   Hematocrit 36.0 - 46.0 % 35.0  41.2  34.1   Platelets 150 - 400 K/uL 346  367  307       Latest Ref Rng & Units 07/26/2022    4:59 AM 07/23/2022    9:59 AM 05/29/2022   11:01 AM  BMP  Glucose 70 - 99 mg/dL 144  119  108   BUN 8 - 23 mg/dL 11  16  13   $ Creatinine 0.44 - 1.00 mg/dL 0.74  0.74  0.78   Sodium  135 - 145 mmol/L 131  137  138   Potassium 3.5 - 5.1 mmol/L 3.0  3.7  4.0   Chloride 98 - 111 mmol/L 97  101  99   CO2 22 - 32 mmol/L 24  29  33   Calcium 8.9 - 10.3 mg/dL 8.5  9.7  10.3    Assessment:  72 y.o. s/p Procedure(s): XI ROBOTIC ASSISTED TOTAL HYSTERECTOMY;LYSIS OF ADHESIONS XI ROBOTIC ASSISTED UNILATERAL SALPINGO OOPHORECTOMY CYSTOSCOPY: stable, meeting early milestones.  Postop: Foley catheter discontinued this morning, awaiting spontaneous void.  Reports flatus.  Discussed bowel regimen for home.  Pain is controlled on current regimen.  Encourage continued ambulation today.  Will advance diet as tolerated.  Hyponatremia, hypochloremia, and hypokalemia: Suspect all in the setting of bowel prep prior to surgery.  Will keep fluids on this morning and recheck labs midday.  Oral potassium also ordered.  Dizziness: Suspect related to recent anesthesia.  The patient has mild acute anemia secondary to surgical blood loss and hemodilution.  Will follow-up repeat H&H midday.  Suspect this may drop a little further given continued IV fluid administration.  Type 2 diabetes: Continue with insulin sliding scale.  Blood sugars have all been less than 200 in the postoperative period.  Prophylaxis: SCDs, ambulation.  Plan: Plan for discharge likely later today. The patient is to be discharged to home.   LOS: 0 days  Lafonda Mosses 07/26/2022, 6:42 AM

## 2022-07-26 NOTE — Discharge Summary (Signed)
Physician Discharge Summary  Patient ID: Frances Gray MRN: [id number] DOB/AGE: [birth year] 72 y.o.  Admit date: 07/25/2022 Discharge date: 07/26/2022  Admission Diagnoses: Endometrial intraepithelial neoplasia (EIN)  Discharge Diagnoses:  Principal Problem:   Endometrial intraepithelial neoplasia (EIN) Active Problems:   Pelvic adhesions   Discharged Condition:  The patient is in good condition and stable for discharge.    Hospital Course: On 07/25/2022, the patient underwent the following: Procedure(s): XI ROBOTIC ASSISTED TOTAL HYSTERECTOMY;LYSIS OF ADHESIONS XI ROBOTIC ASSISTED UNILATERAL SALPINGO OOPHORECTOMY CYSTOSCOPY. The postoperative course was uneventful.  She was discharged to home on postoperative day 1 tolerating a regular diet, voiding without difficulty, pain controlled with oral medications, ambulating without difficulty, passing flatus, labs stable.   Consults: None  Significant Diagnostic Studies: Labs  Treatments: surgery: see above  Discharge Exam: Blood pressure (!) 142/63, pulse 65, temperature 97.7 F (36.5 C), temperature source Oral, resp. rate 18, height 5' 4"$  (1.626 m), weight 265 lb (120.2 kg), SpO2 100 %. General appearance: alert, cooperative, and no distress Resp: clear to auscultation bilaterally Cardio: regular rate and rhythm, S1, S2 normal, no murmur, click, rub or gallop GI: soft, non-tender; bowel sounds normal; no masses,  no organomegaly Extremities: generalized mild edema, non-pitting Incision/Wound: Lap sites to the abdomen with dermabond intact with no drainage or bleeding noted  Disposition: Discharge disposition: 01-Home or Self Care       Discharge Instructions     Call MD for:  difficulty breathing, headache or visual disturbances   Complete by: As directed    Call MD for:  extreme fatigue   Complete by: As directed    Call MD for:  hives   Complete by: As directed    Call MD for:  persistant dizziness or  light-headedness   Complete by: As directed    Call MD for:  persistant nausea and vomiting   Complete by: As directed    Call MD for:  redness, tenderness, or signs of infection (pain, swelling, redness, odor or green/yellow discharge around incision site)   Complete by: As directed    Call MD for:  severe uncontrolled pain   Complete by: As directed    Call MD for:  temperature >100.4   Complete by: As directed    Diet - low sodium heart healthy   Complete by: As directed    Driving Restrictions   Complete by: As directed    No driving for around 1 week(s).  Do not take narcotics and drive. You need to make sure your reaction time has returned.   Increase activity slowly   Complete by: As directed    Lifting restrictions   Complete by: As directed    No lifting greater than 10 lbs, pushing, pulling, straining for 6 weeks.   Sexual Activity Restrictions   Complete by: As directed    No sexual activity, nothing in the vagina, for 10-12 weeks.      Allergies as of 07/26/2022       Reactions   Amlodipine Cough   Metformin And Related Diarrhea   Pravastatin    Joint ache        Medication List     STOP taking these medications    bisacodyl 5 MG EC tablet Commonly known as: DULCOLAX   metroNIDAZOLE 500 MG tablet Commonly known as: FLAGYL   polyethylene glycol powder 17 GM/SCOOP powder Commonly known as: MiraLax Replaced by: polyethylene glycol 17 g packet       TAKE these medications  Biotin 10 MG Tabs Take 10 mg by mouth daily.   calcium carbonate 600 MG Tabs tablet Commonly known as: Calcium 600 Take 1 tablet (600 mg total) by mouth 2 (two) times daily with a meal.   cetirizine 10 MG tablet Commonly known as: ZYRTEC Take 10 mg by mouth as needed for allergies.   clobetasol 0.05 % topical foam Commonly known as: OLUX Apply 1 Application topically every other day.   finasteride 5 MG tablet Commonly known as: PROSCAR Take 5 mg by mouth daily.    fluticasone 50 MCG/ACT nasal spray Commonly known as: FLONASE Place 1 spray into both nostrils daily as needed for allergies or rhinitis.   losartan-hydrochlorothiazide 100-25 MG tablet Commonly known as: HYZAAR TAKE 1 TABLET BY MOUTH EVERY DAY   multivitamin tablet Take 1 tablet by mouth every morning.   polyethylene glycol 17 g packet Commonly known as: MiraLax Take 17 g by mouth daily. Replaces: polyethylene glycol powder 17 GM/SCOOP powder   potassium chloride 10 MEQ tablet Commonly known as: KLOR-CON M Take 1 tablet (10 mEq total) by mouth 2 (two) times daily.   rosuvastatin 5 MG tablet Commonly known as: CRESTOR TAKE 1 TABLET BY MOUTH TWICE WEEKLY   Rybelsus 3 MG Tabs Generic drug: Semaglutide Take 3 mg by mouth daily.   sennosides-docusate sodium 8.6-50 MG tablet Commonly known as: SENOKOT-S Take 2 tablets by mouth at bedtime. For AFTER surgery, do not take if having diarrhea   traMADol 50 MG tablet Commonly known as: ULTRAM Take 1 tablet (50 mg total) by mouth every 6 (six) hours as needed for severe pain. For AFTER surgery only, do not take and drive   Vitamin D3 50 MCG (2000 UT) Tabs Generic drug: Cholecalciferol Take 2,000 Units by mouth every morning.   ZINC PO Take 1 tablet by mouth daily.        Follow-up Information     Lafonda Mosses, MD Follow up on 07/31/2022.   Specialty: Gynecologic Oncology Why: at 4:35 pm will be a PHONE visit with Dr. Berline Lopes to check in and discuss path results. IN PERSON visit will be on 08/30/22 at 2 pm at the [facility]. Contact information: [place] [address] [facility] Follow up on 08/01/2022.   Specialty: Oncology Why: at 2pm to come in for labs at the [facility]. Contact information: [place] [id number] [place] [phone]                Greater than thirty minutes  were spend for face to face discharge instructions and discharge orders/summary in EPIC.   Signed: Dorothyann Gibbs 07/26/2022, 4:20 PM

## 2022-07-26 NOTE — Progress Notes (Signed)
Mobility Specialist - Progress Note   07/26/22 0926  Mobility  Activity Ambulated with assistance in hallway  Level of Assistance Standby assist, set-up cues, supervision of patient - no hands on  Assistive Device Front wheel walker  Distance Ambulated (ft) 120 ft  Activity Response Tolerated well  Mobility Referral Yes  $Mobility charge 1 Mobility   Pt received in bed and agreeable to mobility. Pt fatigued throughout session, but still eager to ambulate. No complaints during session. Pt to bed after session with all needs met.    [facility]

## 2022-07-26 NOTE — Progress Notes (Signed)
  Transition of Care [facility]) Screening Note   Patient Details  Name: Frances Gray Date of Birth: Dec 24, 1950   Transition of Care [facility]) CM/SW Contact:    Henrietta Dine, RN Phone Number: 07/26/2022, 10:45 AM    Transition of Care Department [facility]) has reviewed patient and no TOC needs have been identified at this time. We will continue to monitor patient advancement through interdisciplinary progression rounds. If new patient transition needs arise, please place a TOC consult.

## 2022-07-26 NOTE — Progress Notes (Signed)
GYN Oncology Progress Note  Patient reports doing well.  Tolerating diet with no nausea or emesis.  Ambulating in the halls without difficulty.  Voiding without difficulty and reports mild amount of blood noted in urine.  Pain is controlled with oral as needed medications.  She is passing flatus.  She feels she is ready for discharge.  Husband at the bedside.  Assisted to the bathroom.  400 cc of urine emptied at this time.

## 2022-07-29 ENCOUNTER — Telehealth: Payer: Self-pay

## 2022-07-29 NOTE — Telephone Encounter (Signed)
Patient reports that pain has greatly decreased since the evening of 07/26/22. She describes it has "just being sore."  No concerns voices during phone call.

## 2022-07-29 NOTE — Telephone Encounter (Signed)
Erroneous entry

## 2022-07-30 ENCOUNTER — Inpatient Hospital Stay (HOSPITAL_BASED_OUTPATIENT_CLINIC_OR_DEPARTMENT_OTHER): Payer: Medicare Other | Admitting: Gynecologic Oncology

## 2022-07-30 ENCOUNTER — Encounter: Payer: Self-pay | Admitting: Gynecologic Oncology

## 2022-07-30 DIAGNOSIS — N736 Female pelvic peritoneal adhesions (postinfective): Secondary | ICD-10-CM

## 2022-07-30 DIAGNOSIS — Z90710 Acquired absence of both cervix and uterus: Secondary | ICD-10-CM

## 2022-07-30 DIAGNOSIS — N8502 Endometrial intraepithelial neoplasia [EIN]: Secondary | ICD-10-CM

## 2022-07-30 DIAGNOSIS — Z90722 Acquired absence of ovaries, bilateral: Secondary | ICD-10-CM

## 2022-07-30 DIAGNOSIS — Z8719 Personal history of other diseases of the digestive system: Secondary | ICD-10-CM

## 2022-07-30 NOTE — Progress Notes (Signed)
Gynecologic Oncology Telehealth Note: Gyn-Onc  I connected with Frances Gray on 07/30/22 at 12:45 PM EST by telephone and verified that I am speaking with the correct person using two identifiers.  I discussed the limitations, risks, security and privacy concerns of performing an evaluation and management service by telemedicine and the availability of in-person appointments. I also discussed with the patient that there may be a patient responsible charge related to this service. The patient expressed understanding and agreed to proceed.  Other persons participating in the visit and their role in the encounter: patient's husband.  Patient's location: home Provider's location: [place]  Reason for Visit: follow-up after surgery  Treatment History: Patient developed postmenopausal bleeding on December 8.  She had daily heavy bleeding like a menses until January 18.  After developing the bleeding, she called her OB/GYN to be seen.  She denies any pelvic pain or cramping associated with the bleeding.  Since January 18, she denies any further bleeding or spotting.   Endometrial biopsy on 05/27/2022 reveals endometrial atypical hyperplasia, fragmented endometrial polyp. Pelvic ultrasound exam performed at [facility] on 05/27/2022 revealed a uterus measuring 8.7 x 5.6 x 4.5 cm with a thickened endometrium measuring 9.4 mm.  Right ovary measures 1.6 x 1 x 2.1 cm with a simple appearing cyst.  Left ovary is normal in appearance.  07/25/22: TRH/RSO, LOA and enterolysis for 90 minutes.  Findings:  Enlarged, moderately mobile uterus on EUA. On intra-abdominal entry, normal upper abdominal survey. Some adhesions between the right anterior liver and the abdominal wall. Some adhesions of the omentum to the anterior abdominal wall below the level of the umbilicus. Multiple loops of sigmoid colon densely adherent to the fundal and posterior uterus.  Filmy adhesions of the colon to the right adnexa and to the left  pelvic sidewall. Some thick adhesions between sigmoid colon and posterior uterus. Adhesions between the bladder and LUS/cervix. 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 Uterus 10 cm and bulbous at the fundus. Left adnexa surgically absent. Normal appearing right tube and ovary. Green dye noted within the parametrium on the right without any obvious SLN. No dye seen on the left.  Uterus sent for frozen section with adenomyosis noted. No obvious endometrial lesion. On cystoscopy, intact dome, normal bilateral ureteral efflux.   Interval History: Doing well. Some soreness but denies significant pain. Reports good bowel function. Denies any urinary symptoms. Tolerating PO without nausea or emesis. Has minimal spotting intermittently when she wipes after voiding.  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, R knee s/p TKR 6/16   Diverticulitis    GERD (gastroesophageal reflux disease)    doesn't take any meds   Heart murmur    no symptoms   History of bronchitis winter of 2014   History of colon polyps    benign   History of kidney stones    Hyperlipidemia    Hypertension    takes Hyzaar daily   Perforation of sigmoid colon due to diverticulitis 09/24/2019   Pneumonia    Pre-diabetes    Vitamin D deficiency disease 08/14/2011   takes Vit D daily    Past Surgical History:  Procedure Laterality Date   BREAST BIOPSY Left  Breast ultrasound Right 03/02/2013   The 45m complex cyst in the right breast is probably benign. Follow-up in 6 months with bilateral mammogram and (R) ultrasound   Breast Ultrasound Right 09/02/2013   There is a benign 3 mm complex cyst in the (R) breast at 9 o'clock   COLONOSCOPY     CYSTOSCOPY  07/25/2022   Procedure:  CYSTOSCOPY;  Surgeon: TLafonda Mosses MD;  Location: [facility];  Service: Gynecology;;   [place]  tumor removed   (benign)   KNEE ARTHROSCOPY     bilateral   LAPAROSCOPIC LOW ANTERIOR RESECTION     robotic, with Dr. GJohney Mainein 2023   LITHOTRIPSY  08/2011   R ureteral stone   POLYPECTOMY     PROCTOSCOPY N/A 08/24/2021   Procedure: RIGID PROCTOSCOPY;  Surgeon: GMichael Boston MD;  Location: [facility];  Service: General;  Laterality: N/A;   ROBOTIC ASSISTED SALPINGO OOPHERECTOMY Right 07/25/2022   Procedure: XI ROBOTIC ASSISTED UNILATERAL SALPINGO OOPHORECTOMY;  Surgeon: TLafonda Mosses MD;  Location: [facility];  Service: Gynecology;  Laterality: Right;   ROBOTIC ASSISTED TOTAL HYSTERECTOMY N/A 07/25/2022   Procedure: XI ROBOTIC ASSISTED TOTAL HYSTERECTOMY;LYSIS OF ADHESIONS;  Surgeon: TLafonda Mosses MD;  Location: [facility];  Service: Gynecology;  Laterality: N/A;   TOTAL KNEE ARTHROPLASTY Left 11/16/2012   Procedure: TOTAL KNEE ARTHROPLASTY- left;  Surgeon: JJohnny Bridge MD;  [facility]  Service: Orthopedics;  Laterality: Left;   TOTAL KNEE ARTHROPLASTY Right 12/06/2014   Procedure: TOTAL KNEE ARTHROPLASTY;  Surgeon: JMarchia Bond MD;  [facility]  Service: Orthopedics;  Laterality: Right;   TUBAL LIGATION      Family History  Problem Relation Age of Onset   Ovarian cancer Mother    Colon cancer Sister 569  Breast cancer Sister    Esophageal cancer Neg Hx    Colon polyps Neg Hx    Rectal cancer Neg Hx    Stomach cancer Neg Hx    Endometrial cancer Neg Hx    Pancreatic cancer Neg Hx    Prostate cancer Neg Hx     Social History   Socioeconomic History   Marital status: Married    Spouse name: Not on file   Number of children: Not on file   Years of education: Not on file   Highest education level: Not on file  Occupational History   Occupation: retired  Tobacco Use   Smoking status: Former    Packs/day: 0.25    Years: 10.00    Total pack  years: 2.50    Types: Cigarettes    Quit date: 1980    Years since quitting: 44.1   Smokeless tobacco: Never   Tobacco comments:    quit smoking [place]  Vaping Use   Vaping Use: Never used  Substance and Sexual Activity   Alcohol use: No   Drug use: No   Sexual activity: Not Currently    Birth control/protection: Post-menopausal  Other Topics Concern   Not on file  Social History Narrative   Lives with spouse and g-son, temporarily mother in law (05/2014)   Social Determinants of Health   Financial Resource Strain: Low Risk  (08/16/2021)   Overall Financial Resource Strain (CARDIA)    Difficulty of Paying Living Expenses: Not hard at all  Food Insecurity: No Food Insecurity (07/25/2022)   Hunger Vital Sign    Worried About Running Out of Food in the Last Year: Never true  Ran Out of Food in the Last Year: Never true  Transportation Needs: No Transportation Needs (07/25/2022)   PRAPARE - Hydrologist (Medical): No    Lack of Transportation (Non-Medical): No  Physical Activity: Sufficiently Active (08/16/2021)   Exercise Vital Sign    Days of Exercise per Week: 5 days    Minutes of Exercise per Session: 30 min  Stress: No Stress Concern Present (08/16/2021)   [place]    Feeling of Stress : Not at all  Social Connections: [place] (07/11/2022)   Social Connection and Isolation Panel [NHANES]    Frequency of Communication with Friends and Family: Twice a week    Frequency of Social Gatherings with Friends and Family: Twice a week    Attends Religious Services: More than 4 times per year    Active Member of [employer] or Organizations: Yes    Attends Music therapist: More than 4 times per year    Marital Status: Married    Current Medications:  Current Outpatient Medications:    Biotin 10 MG TABS, Take 10 mg by mouth daily., Disp: , Rfl:    calcium carbonate  (CALCIUM 600) 600 MG TABS tablet, Take 1 tablet (600 mg total) by mouth 2 (two) times daily with a meal., Disp: 60 tablet, Rfl: 6   cetirizine (ZYRTEC) 10 MG tablet, Take 10 mg by mouth as needed for allergies., Disp: , Rfl:    Cholecalciferol (VITAMIN D3) 2000 UNITS TABS, Take 2,000 Units by mouth every morning. , Disp: , Rfl:    clobetasol (OLUX) 0.05 % topical foam, Apply 1 Application topically every other day., Disp: , Rfl:    finasteride (PROSCAR) 5 MG tablet, Take 5 mg by mouth daily., Disp: , Rfl:    fluticasone (FLONASE) 50 MCG/ACT nasal spray, Place 1 spray into both nostrils daily as needed for allergies or rhinitis. , Disp: , Rfl: 0   losartan-hydrochlorothiazide (HYZAAR) 100-25 MG tablet, TAKE 1 TABLET BY MOUTH EVERY DAY, Disp: 90 tablet, Rfl: 1   Multiple Vitamin (MULTIVITAMIN) tablet, Take 1 tablet by mouth every morning. , Disp: , Rfl:    Multiple Vitamins-Minerals (ZINC PO), Take 1 tablet by mouth daily., Disp: , Rfl:    polyethylene glycol (MIRALAX) 17 g packet, Take 17 g by mouth daily., Disp: 30 each, Rfl: 0   potassium chloride (KLOR-CON M) 10 MEQ tablet, Take 1 tablet (10 mEq total) by mouth 2 (two) times daily., Disp: 6 tablet, Rfl: 0   rosuvastatin (CRESTOR) 5 MG tablet, TAKE 1 TABLET BY MOUTH TWICE WEEKLY, Disp: 24 tablet, Rfl: 1   Semaglutide (RYBELSUS) 3 MG TABS, Take 3 mg by mouth daily. (Patient not taking: Reported on 07/18/2022), Disp: 30 tablet, Rfl: 0   sennosides-docusate sodium (SENOKOT-S) 8.6-50 MG tablet, Take 2 tablets by mouth at bedtime. For AFTER surgery, do not take if having diarrhea, Disp: , Rfl:    traMADol (ULTRAM) 50 MG tablet, Take 1 tablet (50 mg total) by mouth every 6 (six) hours as needed for severe pain. For AFTER surgery only, do not take and drive, Disp: 10 tablet, Rfl: 0  Review of Symptoms: Pertinent positives as per HPI.  Physical Exam: Deferred given limitations of phone visit.  Laboratory & Radiologic Studies: A. UTERUS WITH RIGHT  FALLOPIAN TUBE AND OVARY, HYSTERECTOMY AND  SALPINGECTOMY:  Simple hyperplasia without atypia  Extensive adenomyosis  Benign leiomyomata, intramural, measuring 1.2 cm in greatest dimension  Fibrous uterine serosal adhesions and tubo-ovarian serosal adhesions  Benign simple cyst of ovary  Hemosalpinx  Negative for malignancy   Assessment & Plan: Frances Gray is a 72 y.o. woman with EIN s/p definitive surgery.  Doing well, meeting milestones. Discussed precautions, continued expectations and restrictions. Reviewed pathology from surgery showing simple hyperplasia without atypia. Patient very happy with this news.  I discussed the assessment and treatment plan with the patient. The patient was provided with an opportunity to ask questions and all were answered. The patient agreed with the plan and demonstrated an understanding of the instructions.   The patient was advised to call back or see an in-person evaluation if the symptoms worsen or if the condition fails to improve as anticipated.   8 minutes of total time was spent for this patient encounter, including preparation, phone counseling with the patient and coordination of care, and documentation of the encounter.   Jeral Pinch, MD  Division of Gynecologic Oncology  Department of Obstetrics and Gynecology  [facility]

## 2022-07-31 ENCOUNTER — Inpatient Hospital Stay: Payer: Medicare Other | Admitting: Gynecologic Oncology

## 2022-08-01 ENCOUNTER — Inpatient Hospital Stay: Payer: Medicare Other

## 2022-08-01 ENCOUNTER — Other Ambulatory Visit: Payer: Self-pay

## 2022-08-01 DIAGNOSIS — Z800 Family history of malignant neoplasm of digestive organs: Secondary | ICD-10-CM | POA: Diagnosis not present

## 2022-08-01 DIAGNOSIS — E876 Hypokalemia: Secondary | ICD-10-CM

## 2022-08-01 DIAGNOSIS — I10 Essential (primary) hypertension: Secondary | ICD-10-CM | POA: Diagnosis not present

## 2022-08-01 DIAGNOSIS — Z8041 Family history of malignant neoplasm of ovary: Secondary | ICD-10-CM | POA: Diagnosis not present

## 2022-08-01 DIAGNOSIS — E871 Hypo-osmolality and hyponatremia: Secondary | ICD-10-CM

## 2022-08-01 DIAGNOSIS — N8502 Endometrial intraepithelial neoplasia [EIN]: Secondary | ICD-10-CM | POA: Diagnosis not present

## 2022-08-01 DIAGNOSIS — Z803 Family history of malignant neoplasm of breast: Secondary | ICD-10-CM | POA: Diagnosis not present

## 2022-08-01 LAB — BASIC METABOLIC PANEL
Anion gap: 5 (ref 5–15)
BUN: 14 mg/dL (ref 8–23)
CO2: 33 mmol/L — ABNORMAL HIGH (ref 22–32)
Calcium: 9.7 mg/dL (ref 8.9–10.3)
Chloride: 102 mmol/L (ref 98–111)
Creatinine, Ser: 0.64 mg/dL (ref 0.44–1.00)
GFR, Estimated: >60 mL/min (ref >60)
Glucose, Bld: 127 mg/dL — ABNORMAL HIGH (ref 70–99)
Potassium: 4.4 mmol/L (ref 3.5–5.1)
Sodium: 140 mmol/L (ref 135–145)

## 2022-08-01 LAB — CBC (CANCER CENTER ONLY)
HCT: 37.9 % (ref 36.0–46.0)
Hemoglobin: 12.4 g/dL (ref 12.0–15.0)
MCH: 28.6 pg (ref 26.0–34.0)
MCHC: 32.7 g/dL (ref 30.0–36.0)
MCV: 87.3 fL (ref 80.0–100.0)
Platelet Count: 356 10*3/uL (ref 150–400)
RBC: 4.34 MIL/uL (ref 3.87–5.11)
RDW: 13.8 % (ref 11.5–15.5)
WBC Count: 8.8 10*3/uL (ref 4.0–10.5)
nRBC: 0 % (ref 0.0–0.2)

## 2022-08-05 ENCOUNTER — Telehealth: Payer: Self-pay

## 2022-08-05 NOTE — Telephone Encounter (Signed)
Called patient to schedule Medicare Annual Wellness Visit (AWV). Left message for patient to call back and schedule Medicare Annual Wellness Visit (AWV).  Last date of AWV: 08/16/21  Please schedule an appointment at any time with [facility].   Norton Blizzard, [place] ([facility])  [place] [facility] [phone]

## 2022-08-15 ENCOUNTER — Telehealth: Payer: Self-pay | Admitting: *Deleted

## 2022-08-15 NOTE — Telephone Encounter (Signed)
Spoke with the patient and moved her appt from 3/22 to 3/12

## 2022-08-16 NOTE — Progress Notes (Unsigned)
Gynecologic Oncology Post-operative Follow Up  Reason for Visit: follow-up after surgery  Treatment History: Patient developed postmenopausal bleeding on May 17, 2022.  She had daily heavy bleeding like a menses until June 27, 2022.  After developing the bleeding, she called her OB/GYN to be seen.  She denies any pelvic pain or cramping associated with the bleeding.  Since January 18, she denies any further bleeding or spotting.   Endometrial biopsy on 05/27/2022 reveals endometrial atypical hyperplasia, fragmented endometrial polyp.  Pelvic ultrasound exam performed at [facility] on 05/27/2022 revealed a uterus measuring 8.7 x 5.6 x 4.5 cm with a thickened endometrium measuring 9.4 mm.  Right ovary measures 1.6 x 1 x 2.1 cm with a simple appearing cyst.  Left ovary is normal in appearance.  07/25/22: TRH/RSO, LOA and enterolysis for 90 minutes.   Operative Findings:  Enlarged, moderately mobile uterus on EUA. On intra-abdominal entry, normal upper abdominal survey. Some adhesions between the right anterior liver and the abdominal wall. Some adhesions of the omentum to the anterior abdominal wall below the level of the umbilicus. Multiple loops of sigmoid colon densely adherent to the fundal and posterior uterus.  Filmy adhesions of the colon to the right adnexa and to the left pelvic sidewall. Some thick adhesions between sigmoid colon and posterior uterus. Adhesions between the bladder and LUS/cervix. 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 Uterus 10 cm and bulbous at the fundus. Left adnexa surgically absent. Normal appearing right tube and ovary. Green dye noted within the parametrium on the right without any obvious SLN. No dye seen on the left. Uterus sent for frozen section with adenomyosis noted. No obvious endometrial lesion. On cystoscopy, intact dome, normal bilateral ureteral efflux.   Interval History: She  presents today for continued follow up. She reports doing well. Tolerating her diet with no nausea or emesis. Continues to use sennakot-S daily and feels her bowels have not totally returned to normal. Preoperatively, she has taking senna daily. She has been increasing her activity and looks forward to walking more with the warmer weather. No concerns voiced with her incisions. Abdominal soreness felt more with movement, bending over etc. No changes in her bladder habits. Has had light vaginal spotting that continues to decrease in the amount everyday. No new lower extremity edema or pain. All questions answered. No concerns voiced.  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, R knee s/p TKR 6/16   Diverticulitis    GERD (gastroesophageal reflux disease)    doesn't take any meds   Heart murmur    no symptoms   History of bronchitis winter of 2014   History of colon polyps    benign   History of kidney stones    Hyperlipidemia    Hypertension    takes Hyzaar daily   Perforation of sigmoid colon due to diverticulitis 09/24/2019   Pneumonia    Pre-diabetes    Vitamin D deficiency disease 08/14/2011   takes Vit D daily    Past Surgical History:  Procedure Laterality Date   BREAST BIOPSY Left    Breast ultrasound Right 03/02/2013   The 39m complex cyst in the right breast is probably benign. Follow-up in 6 months with bilateral  mammogram and (R) ultrasound   Breast Ultrasound Right 09/02/2013   There is a benign 3 mm complex cyst in the (R) breast at 9 o'clock   COLONOSCOPY     CYSTOSCOPY  07/25/2022   Procedure: CYSTOSCOPY;  Surgeon: Frances Mosses, MD;  Location: [facility];  Service: Gynecology;;   [place]   tumor removed   (benign)   KNEE ARTHROSCOPY     bilateral   LAPAROSCOPIC LOW  ANTERIOR RESECTION     robotic, with Frances Gray in 2023   LITHOTRIPSY  08/2011   R ureteral stone   POLYPECTOMY     PROCTOSCOPY N/A 08/24/2021   Procedure: RIGID PROCTOSCOPY;  Surgeon: Frances Boston, MD;  Location: [facility];  Service: General;  Laterality: N/A;   ROBOTIC ASSISTED SALPINGO OOPHERECTOMY Right 07/25/2022   Procedure: XI ROBOTIC ASSISTED UNILATERAL SALPINGO OOPHORECTOMY;  Surgeon: Frances Mosses, MD;  Location: [facility];  Service: Gynecology;  Laterality: Right;   ROBOTIC ASSISTED TOTAL HYSTERECTOMY N/A 07/25/2022   Procedure: XI ROBOTIC ASSISTED TOTAL HYSTERECTOMY;LYSIS OF ADHESIONS;  Surgeon: Frances Mosses, MD;  Location: [facility];  Service: Gynecology;  Laterality: N/A;   TOTAL KNEE ARTHROPLASTY Left 11/16/2012   Procedure: TOTAL KNEE ARTHROPLASTY- left;  Surgeon: Frances Bridge, MD;  Location: [facility];  Service: Orthopedics;  Laterality: Left;   TOTAL KNEE ARTHROPLASTY Right 12/06/2014   Procedure: TOTAL KNEE ARTHROPLASTY;  Surgeon: Frances Bond, MD;  Location: [place];  Service: Orthopedics;  Laterality: Right;   TUBAL LIGATION      Family History  Problem Relation Age of Onset   Ovarian cancer Mother    Colon cancer Sister 20   Breast cancer Sister    Esophageal cancer Neg Hx    Colon polyps Neg Hx    Rectal cancer Neg Hx    Stomach cancer Neg Hx    Endometrial cancer Neg Hx    Pancreatic cancer Neg Hx    Prostate cancer Neg Hx     Social History   Socioeconomic History   Marital status: Married    Spouse name: Not on file   Number of children: Not on file   Years of education: Not on file   Highest education level: Not on file  Occupational History   Occupation: retired  Tobacco Use   Smoking status: Former    Packs/day: 0.25    Years: 10.00    Total pack years: 2.50    Types: Cigarettes    Quit date: 1980    Years since quitting: 44.2   Smokeless tobacco: Never   Tobacco comments:    quit smoking 46yr ago  Vaping Use   Vaping Use: Never used   Substance and Sexual Activity   Alcohol use: No   Drug use: No   Sexual activity: Not Currently    Birth control/protection: Post-menopausal  Other Topics Concern   Not on file  Social History Narrative   Lives with spouse and g-son, temporarily mother in law (05/2014)   Social Determinants of Health   Financial Resource Strain: Low Risk  (08/16/2021)   Overall Financial Resource Strain (CARDIA)    Difficulty of Paying Living Expenses: Not hard at all  Food Insecurity: No Food Insecurity (07/25/2022)   Hunger Vital Sign    Worried About Running Out of Food in the Last Year: Never true    Ran Out of Food in the Last Year: Never true  Transportation Needs: No Transportation Needs (07/25/2022)   [place] (Medical[place] No    Lack of Transportation (Non-Medical): No  Physical Activity: Sufficiently Active (08/16/2021)   Exercise Vital Sign    Days of Exercise per Week: 5 days    Minutes of Exercise per Session: 30 min  Stress: No Stress Concern Present (08/16/2021)   [place]    Feeling of Stress : Not at all  Social Connections: [place] (07/11/2022)   Social Connection and Isolation Panel [NHANES]    Frequency of Communication with Friends and Family: Twice a week    Frequency of Social Gatherings with Friends and Family: Twice a week    Attends Religious Services: More than 4 times per year    Active Member of [employer] or Organizations: Yes    Attends Music therapist: More than 4 times per year    Marital Status: Married    Current Medications:  Current Outpatient Medications:    Biotin 10 MG TABS, Take 10 mg by mouth daily., Disp: , Rfl:    calcium carbonate (CALCIUM 600) 600 MG TABS tablet, Take 1 tablet (600 mg total) by mouth 2 (two) times daily with a meal., Disp: 60 tablet, Rfl: 6   cetirizine (ZYRTEC) 10 MG tablet, Take 10 mg by mouth as needed for  allergies., Disp: , Rfl:    Cholecalciferol (VITAMIN D3) 2000 UNITS TABS, Take 2,000 Units by mouth every morning. , Disp: , Rfl:    clobetasol (OLUX) 0.05 % topical foam, Apply 1 Application topically every other day., Disp: , Rfl:    finasteride (PROSCAR) 5 MG tablet, Take 5 mg by mouth daily., Disp: , Rfl:    fluticasone (FLONASE) 50 MCG/ACT nasal spray, Place 1 spray into both nostrils daily as needed for allergies or rhinitis. , Disp: , Rfl: 0   losartan-hydrochlorothiazide (HYZAAR) 100-25 MG tablet, TAKE 1 TABLET BY MOUTH EVERY DAY, Disp: 90 tablet, Rfl: 1   Multiple Vitamin (MULTIVITAMIN) tablet, Take 1 tablet by mouth every morning. , Disp: , Rfl:    Multiple Vitamins-Minerals (ZINC PO), Take 1 tablet by mouth daily., Disp: , Rfl:    polyethylene glycol (MIRALAX) 17 g packet, Take 17 g by mouth daily., Disp: 30 each, Rfl: 0   potassium chloride (KLOR-CON M) 10 MEQ tablet, Take 1 tablet (10 mEq total) by mouth 2 (two) times daily., Disp: 6 tablet, Rfl: 0   rosuvastatin (CRESTOR) 5 MG tablet, TAKE 1 TABLET BY MOUTH TWICE WEEKLY, Disp: 24 tablet, Rfl: 1   sennosides-docusate sodium (SENOKOT-S) 8.6-50 MG tablet, Take 2 tablets by mouth at bedtime. For AFTER surgery, do not take if having diarrhea, Disp: , Rfl:   Review of Symptoms: Pertinent positives as per HPI.  Physical Exam: General: Well developed, well nourished female in no acute distress. Alert and oriented x 3.  Cardiovascular: Regular rate and rhythm. S1 and S2 normal.  Lungs: Clear to auscultation bilaterally. No wheezes/crackles/rhonchi noted.  Skin: No rashes or lesions present. Back: No CVA tenderness.  Abdomen: Abdomen soft, non-tender and obese. Active bowel sounds in all quadrants. No evidence of a fluid wave or abdominal masses. Lap site incisions to the abdomen intact with no erythema or drainage. Mattress suture removed from the incision above the umbilicus. Mild reactive erythema, not infectious. Genitourinary:     Vulva/vagina: Normal external female genitalia. No lesions.    Urethra: No lesions or masses.    Vagina: Atrophic without any lesions. Speculum exam reveals cuff  intact, minimal blood, no discharge within the vaginal vault.  Suture visualized. Bimanual exam reveals cuff intact, no fluctuance or tenderness with palpation.    Extremities: No bilateral cyanosis, edema, or clubbing.    Laboratory & Radiologic Studies: A. UTERUS WITH RIGHT FALLOPIAN TUBE AND OVARY, HYSTERECTOMY AND  SALPINGECTOMY:  Simple hyperplasia without atypia  Extensive adenomyosis  Benign leiomyomata, intramural, measuring 1.2 cm in greatest dimension  Fibrous uterine serosal adhesions and tubo-ovarian serosal adhesions  Benign simple cyst of ovary  Hemosalpinx  Negative for malignancy   Assessment & Plan: Frances Gray is a 73 y.o. woman with EIN s/p definitive surgery.  Doing well, meeting milestones. Discussed precautions, continued expectations and restrictions. Reviewed pathology from surgery showing simple hyperplasia without atypia. She is advised to follow up with your primary care provider and gynecologist for her well women care. No follow up needed with GYN Oncology unless needs arise in the future. She is advised to call for any needs or concerns.  15 minutes of total time was spent for this patient encounter, including preparation, phone counseling with the patient and coordination of care, and documentation of the encounter.  [facility]

## 2022-08-20 ENCOUNTER — Other Ambulatory Visit: Payer: Self-pay

## 2022-08-20 ENCOUNTER — Inpatient Hospital Stay: Payer: Medicare Other | Attending: Gynecologic Oncology | Admitting: Gynecologic Oncology

## 2022-08-20 VITALS — BP 150/70 | HR 54 | Temp 97.6°F | Resp 18 | Ht 64.0 in | Wt 264.0 lb

## 2022-08-20 DIAGNOSIS — Z90710 Acquired absence of both cervix and uterus: Secondary | ICD-10-CM

## 2022-08-20 DIAGNOSIS — N8502 Endometrial intraepithelial neoplasia [EIN]: Secondary | ICD-10-CM

## 2022-08-20 DIAGNOSIS — Z90721 Acquired absence of ovaries, unilateral: Secondary | ICD-10-CM

## 2022-08-20 NOTE — Patient Instructions (Addendum)
You are healing well from surgery.  Continue with your restrictions after surgery including no intercourse for 10-12 weeks. If you are still having light spotting, do not resume intercourse until this resolves. No heavy lifting or strenuous activity for full 6 weeks.  You can apply a swipe of neosporin on the incision above the bellybutton to assist with healing.  Plan to follow up with your primary care provider and gyn for your well women care. No follow up needed with GYN Oncology unless needs arise in the future.  Please call the office for any needs or concerns.

## 2022-08-21 ENCOUNTER — Encounter: Payer: Self-pay | Admitting: Gynecologic Oncology

## 2022-08-21 ENCOUNTER — Ambulatory Visit (INDEPENDENT_AMBULATORY_CARE_PROVIDER_SITE_OTHER): Payer: Medicare Other

## 2022-08-21 VITALS — BP 130/70 | HR 55 | Temp 97.5°F | Ht 64.0 in | Wt 265.2 lb

## 2022-08-21 DIAGNOSIS — Z0000 Encounter for general adult medical examination without abnormal findings: Secondary | ICD-10-CM | POA: Diagnosis not present

## 2022-08-21 NOTE — Progress Notes (Signed)
Subjective:   Frances Gray is a 72 y.o. female who presents for Medicare Annual (Subsequent) preventive examination.  Review of Systems           Objective:    Today's Vitals   08/21/22 1020  Height: '5\' 4"'$  (1.626 m)  PainSc: 0-No pain   Body mass index is 45.32 kg/m.     07/25/2022   11:00 AM 07/25/2022    6:16 AM 07/23/2022    9:42 AM 07/11/2022   10:40 AM 08/24/2021    5:45 AM 08/16/2021   11:29 AM 08/09/2021   11:27 AM  Advanced Directives  Does Patient Have a Medical Advance Directive? No  No No No No No  Would patient like information on creating a medical advance directive? No - Patient declined No - Patient declined Yes (MAU/Ambulatory/Procedural Areas - Information given) Yes (MAU/Ambulatory/Procedural Areas - Information given) No - Patient declined No - Patient declined     Current Medications (verified) Outpatient Encounter Medications as of 08/21/2022  Medication Sig   Biotin 10 MG TABS Take 10 mg by mouth daily.   calcium carbonate (CALCIUM 600) 600 MG TABS tablet Take 1 tablet (600 mg total) by mouth 2 (two) times daily with a meal.   cetirizine (ZYRTEC) 10 MG tablet Take 10 mg by mouth as needed for allergies.   Cholecalciferol (VITAMIN D3) 2000 UNITS TABS Take 2,000 Units by mouth every morning.    clobetasol (OLUX) 0.05 % topical foam Apply 1 Application topically every other day.   finasteride (PROSCAR) 5 MG tablet Take 5 mg by mouth daily.   fluticasone (FLONASE) 50 MCG/ACT nasal spray Place 1 spray into both nostrils daily as needed for allergies or rhinitis.    losartan-hydrochlorothiazide (HYZAAR) 100-25 MG tablet TAKE 1 TABLET BY MOUTH EVERY DAY   Multiple Vitamin (MULTIVITAMIN) tablet Take 1 tablet by mouth every morning.    Multiple Vitamins-Minerals (ZINC PO) Take 1 tablet by mouth daily.   polyethylene glycol (MIRALAX) 17 g packet Take 17 g by mouth daily.   potassium chloride (KLOR-CON M) 10 MEQ tablet Take 1 tablet (10 mEq total) by mouth 2 (two)  times daily.   rosuvastatin (CRESTOR) 5 MG tablet TAKE 1 TABLET BY MOUTH TWICE WEEKLY   sennosides-docusate sodium (SENOKOT-S) 8.6-50 MG tablet Take 2 tablets by mouth at bedtime. For AFTER surgery, do not take if having diarrhea   No facility-administered encounter medications on file as of 08/21/2022.    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, R knee s/p TKR 6/16   Diverticulitis    GERD (gastroesophageal reflux disease)    doesn't take any meds   Heart murmur    no symptoms   History of bronchitis winter of 2014   History of colon polyps    benign   History of kidney stones    Hyperlipidemia    Hypertension    takes Hyzaar daily   Perforation of sigmoid colon due to diverticulitis 09/24/2019   Pneumonia    Pre-diabetes    Vitamin D deficiency disease 08/14/2011   takes Vit D daily   Past Surgical History:  Procedure Laterality Date   BREAST BIOPSY Left    Breast ultrasound Right 03/02/2013  The 44m complex cyst in the right breast is probably benign. Follow-up in 6 months with bilateral mammogram and (R) ultrasound   Breast Ultrasound Right 09/02/2013   There is a benign 3 mm complex cyst in the (R) breast at 9 o'clock   COLONOSCOPY     CYSTOSCOPY  07/25/2022   Procedure: CYSTOSCOPY;  Surgeon: TLafonda Mosses MD;  Location: [facility];  Service: Gynecology;;   [place]  tumor removed   (benign)   KNEE ARTHROSCOPY     bilateral   LAPAROSCOPIC LOW ANTERIOR RESECTION     robotic, with Dr. GJohney Mainein 2023   LITHOTRIPSY  08/2011   R ureteral stone   POLYPECTOMY     PROCTOSCOPY N/A 08/24/2021   Procedure: RIGID PROCTOSCOPY;  Surgeon: GMichael Boston MD;  Location: [facility];  Service: General;  Laterality: N/A;   ROBOTIC  ASSISTED SALPINGO OOPHERECTOMY Right 07/25/2022   Procedure: XI ROBOTIC ASSISTED UNILATERAL SALPINGO OOPHORECTOMY;  Surgeon: TLafonda Mosses MD;  Location: [facility];  Service: Gynecology;  Laterality: Right;   ROBOTIC ASSISTED TOTAL HYSTERECTOMY N/A 07/25/2022   Procedure: XI ROBOTIC ASSISTED TOTAL HYSTERECTOMY;LYSIS OF ADHESIONS;  Surgeon: TLafonda Mosses MD;  Location: [facility];  Service: Gynecology;  Laterality: N/A;   TOTAL KNEE ARTHROPLASTY Left 11/16/2012   Procedure: TOTAL KNEE ARTHROPLASTY- left;  Surgeon: JJohnny Bridge MD;  [place] [facility]  Service: [place]opedics;  Laterality: Left;   TOTAL KNEE ARTHROPLASTY Right 12/06/2014   Procedure: TOTAL KNEE ARTHROPLASTY;  Surgeon: JMarchia Bond MD;  [place] [facility]  Service: Orthopedics;  Laterality: Right;   TUBAL LIGATION     Family History  Problem Relation Age of Onset   Ovarian cancer Mother    Colon cancer Sister 546  Breast cancer Sister    Esophageal cancer Neg Hx    Colon polyps Neg Hx    Rectal cancer Neg Hx    Stomach cancer Neg Hx    Endometrial cancer Neg Hx    Pancreatic cancer Neg Hx    Prostate cancer Neg Hx    Social History   Socioeconomic History   Marital status: Married    Spouse name: Not on file   Number of children: Not on file   Years of education: Not on file   Highest education level: Not on file  Occupational History   Occupation: retired  Tobacco Use   Smoking status: Former    Packs/day: 0.25    Years: 10.00    Total pack years: 2.50    Types: Cigarettes    Quit date: 1980    Years since quitting: 44.2   Smokeless tobacco: Never   Tobacco comments:    quit smoking 332yrago  Vaping Use   Vaping Use: Never used  Substance and Sexual Activity   Alcohol use: No   Drug use: No   Sexual activity: Not Currently    Birth control/protection: Post-menopausal  Other Topics Concern   Not on file  Social History Narrative   Lives with spouse and g-son, temporarily mother in law (05/2014)    Social Determinants of Health   Financial Resource Strain: Low Risk  (08/21/2022)   Overall Financial Resource Strain (CARDIA)    Difficulty of Paying Living Expenses: Not hard at all  Food Insecurity: No Food Insecurity (08/21/2022)   Hunger Vital Sign    Worried About Running Out of Food in the Last Year: Never true    Ran Out of Food in  the Last Year: Never true  Transportation Needs: No Transportation Needs (08/21/2022)   PRAPARE - Hydrologist (Medical): No    Lack of Transportation (Non-Medical): No  Physical Activity: Sufficiently Active (08/21/2022)   Exercise Vital Sign    Days of Exercise per Week: 5 days    Minutes of Exercise per Session: 30 min  Stress: No Stress Concern Present (08/21/2022)   Tobias    Feeling of Stress : Not at all  Social Connections: [place] (08/21/2022)   Social Connection and Isolation Panel [NHANES]    Frequency of Communication with Friends and Family: Twice a week    Frequency of Social Gatherings with Friends and Family: Twice a week    Attends Religious Services: More than 4 times per year    Active Member of [employer] or Organizations: Yes    Attends Music therapist: More than 4 times per year    Marital Status: Married    Tobacco Counseling Counseling given: Not Answered Tobacco comments: quit smoking 31yr ago   Clinical Intake:  Pre-visit preparation completed: Yes  Pain : No/denies pain Pain Score: 0-No pain     Nutritional Risks: None Diabetes: Yes CBG done?: No Did pt. bring in CBG monitor from home?: No  How often do you need to have someone help you when you read instructions, pamphlets, or other written materials from your doctor or pharmacy?: 1 - Never What is the last grade level you completed in school?: HSG  Nutrition Risk Assessment:  Has the patient had any N/V/D within the last 2 months?   No  Does the patient have any non-healing wounds?  No  Has the patient had any unintentional weight loss or weight gain?  No   Diabetes:  Is the patient diabetic?  Yes  If diabetic, was a CBG obtained today?  No  Did the patient bring in their glucometer from home?  No  How often do you monitor your CBG's? No.   Financial Strains and Diabetes Management:  Are you having any financial strains with the device, your supplies or your medication? No .  Does the patient want to be seen by Chronic Care Management for management of their diabetes?  No  Would the patient like to be referred to a Nutritionist or for Diabetic Management?  No   Diabetic Exams:  Diabetic Eye Exam: Completed 01/02/2022 Diabetic Foot Exam: Overdue, Pt has been advised about the importance in completing this exam. Pt is scheduled for diabetic foot exam on 11/26/2022.   Interpreter Needed?: No  Information entered by :: SLisette Abu LPN.   Activities of Daily Living    07/25/2022   11:00 AM 07/23/2022    9:46 AM  In your present state of health, do you have any difficulty performing the following activities:  Hearing? 0   Vision? 0   Difficulty concentrating or making decisions? 0   Walking or climbing stairs? 0   Dressing or bathing? 0   Doing errands, shopping? 0 0    Patient Care Team: BBinnie Rail MD as PCP - General (Internal Medicine) EFestus Aloe MD (Urology) LMarchia Bond MD (Orthopedic Surgery) GArmandina Gemma MD (General Surgery) LKaty Apo MD (Ophthalmology) LPrincess Bruins MD (Obstetrics and Gynecology) KInda Castle MD (Inactive) (Gastroenterology) FCharlton Haws [facility] Pharmacist (Pharmacist) GMichael Boston MD as Consulting Physician (Colon and Rectal Surgery)  Indicate any recent Medical Services you  may have received from other than [facility] providers in the past year (date may be approximate).     Assessment:   This is a routine wellness examination  for Frances Gray.  Hearing/Vision screen No results found.  Dietary issues and exercise activities discussed:     Goals Addressed   None   Depression Screen    08/21/2022   10:21 AM 07/11/2022   10:40 AM 05/29/2022    9:53 AM 11/27/2021    9:50 AM 08/16/2021   11:49 AM 08/15/2020   10:23 AM 11/30/2019   10:10 AM  PHQ 2/9 Scores  PHQ - 2 Score 1 1 0 0 0 0 0  PHQ- 9 Score   0 0       Fall Risk    05/29/2022    9:53 AM 11/27/2021    9:51 AM 08/16/2021   12:02 PM 08/15/2020   10:24 AM 11/30/2019    9:35 AM  Fall Risk   Falls in the past year? 0 0 0 1 0  Number falls in past yr: 0  0 0 0  Injury with Fall? 0  0 0 0  Risk for fall due to : No Fall Risks  No Fall Risks No Fall Risks No Fall Risks  Follow up Falls evaluation completed  Falls evaluation completed Falls evaluation completed     FALL RISK PREVENTION PERTAINING TO THE HOME:  Any stairs in or around the home? No  If so, are there any without handrails? No  Home free of loose throw rugs in walkways, pet beds, electrical cords, etc? Yes  Adequate lighting in your home to reduce risk of falls? Yes   ASSISTIVE DEVICES UTILIZED TO PREVENT FALLS:  Life alert? No  Use of a cane, walker or w/c? No  Grab bars in the bathroom? Yes  Shower chair or bench in shower? Yes  Elevated toilet seat or a handicapped toilet? Yes   TIMED UP AND GO:  Was the test performed? Yes .  Length of time to ambulate 10 feet: 8 sec.   Gait steady and fast without use of assistive device  Cognitive Function:        08/21/2022   10:22 AM  6CIT Screen  What Year? 0 points  What month? 0 points  What time? 0 points  Count back from 20 0 points  Months in reverse 0 points  Repeat phrase 0 points  Total Score 0 points    Immunizations Immunization History  Administered Date(s) Administered   Fluad Quad(high Dose 65+) 05/08/2021, 03/04/2022   Influenza, High Dose Seasonal PF 05/15/2015, 05/20/2016, 02/27/2017, 05/26/2018, 03/17/2019,  03/29/2020   Influenza, Seasonal, Injecte, Preservative Fre 08/12/2012   Influenza,inj,Quad PF,6+ Mos 02/11/2013, 05/12/2014   Moderna Covid-19 Vaccine Bivalent Booster 18yr & up 05/13/2022   Moderna SARS-COV2 Booster Vaccination 10/12/2020, 05/08/2021   Moderna Sars-Covid-2 Vaccination 07/25/2019, 08/22/2019, 03/29/2020   Pneumococcal Conjugate-13 05/15/2015   Pneumococcal Polysaccharide-23 06/10/2010, 11/19/2016   Zoster Recombinat (Shingrix) 12/03/2018, 02/10/2019   Zoster, Live 10/09/2010    TDAP status: Due, Education has been provided regarding the importance of this vaccine. Advised may receive this vaccine at local pharmacy or Health Dept. Aware to provide a copy of the vaccination record if obtained from local pharmacy or Health Dept. Verbalized acceptance and understanding.  Flu Vaccine status: Up to date  Pneumococcal vaccine status: Up to date  Covid-19 vaccine status: Completed vaccines  Qualifies for Shingles Vaccine? Yes   Zostavax completed Yes   Shingrix Completed?:  Yes  Screening Tests Health Maintenance  Topic Date Due   DTaP/Tdap/Td (1 - Tdap) Never done   FOOT EXAM  11/21/2021   COVID-19 Vaccine (5 - 2023-24 season) 07/08/2022   Diabetic kidney evaluation - Urine ACR  11/28/2022   HEMOGLOBIN A1C  11/28/2022   OPHTHALMOLOGY EXAM  01/03/2023   Diabetic kidney evaluation - eGFR measurement  08/02/2023   Medicare Annual Wellness (AWV)  08/21/2023   COLONOSCOPY (Pts 45-52yr Insurance coverage will need to be confirmed)  11/24/2023   MAMMOGRAM  12/01/2023   DEXA SCAN  06/21/2027   Pneumonia Vaccine 72 Years old  Completed   INFLUENZA VACCINE  Completed   Hepatitis C Screening  Completed   Zoster Vaccines- Shingrix  Completed   HPV VACCINES  Aged Out    Health Maintenance  Health Maintenance Due  Topic Date Due   DTaP/Tdap/Td (1 - Tdap) Never done   FOOT EXAM  11/21/2021   COVID-19 Vaccine (5 - 2023-24 season) 07/08/2022    Colorectal cancer  screening: Type of screening: Colonoscopy. Completed 11/24/2018. Repeat every 5 years  Mammogram status: Completed 11/30/2021. Repeat every year  Bone Density status: Completed 06/20/2022. Results reflect: Bone density results: NORMAL. Repeat every 3-5 years.  Lung Cancer Screening: (Low Dose CT Chest recommended if Age 73-80years, 30 pack-year currently smoking OR have quit w/in 15years.) does not qualify.   Lung Cancer Screening Referral: no  Additional Screening:  Hepatitis C Screening: does qualify; Completed 05/15/2015  Vision Screening: Recommended annual ophthalmology exams for early detection of glaucoma and other disorders of the eye. Is the patient up to date with their annual eye exam?  Yes  Who is the provider or what is the name of the office in which the patient attends annual eye exams? GKaty Apo MD. If pt is not established with a provider, would they like to be referred to a provider to establish care? No .   Dental Screening: Recommended annual dental exams for proper oral hygiene  Community Resource Referral / Chronic Care Management: CRR required this visit?  No   CCM required this visit?  No      Plan:     I have personally reviewed and noted the following in the patient's chart:   Medical and social history Use of alcohol, tobacco or illicit drugs  Current medications and supplements including opioid prescriptions. Patient is not currently taking opioid prescriptions. Functional ability and status Nutritional status Physical activity Advanced directives List of other physicians Hospitalizations, surgeries, and ER visits in previous 12 months Vitals Screenings to include cognitive, depression, and falls Referrals and appointments  In addition, I have reviewed and discussed with patient certain preventive protocols, quality metrics, and best practice recommendations. A written personalized care plan for preventive services as well as general  preventive health recommendations were provided to patient.     SSheral Flow LPN   [id number]  Nurse Notes:  Normal cognitive status assessed by direct observation by this Nurse Health Advisor. No abnormalities found.

## 2022-08-21 NOTE — Patient Instructions (Signed)
Frances Gray , Thank you for taking time to come for your Medicare Wellness Visit. I appreciate your ongoing commitment to your health goals. Please review the following plan we discussed and let me know if I can assist you in the future.   These are the goals we discussed:  Goals      My goal for 2024 is to lose weight and stay physically active.        This is a list of the screening recommended for you and due dates:  Health Maintenance  Topic Date Due   DTaP/Tdap/Td vaccine (1 - Tdap) Never done   Complete foot exam   11/21/2021   COVID-19 Vaccine (5 - 2023-24 season) 07/08/2022   Yearly kidney health urinalysis for diabetes  11/28/2022   Hemoglobin A1C  11/28/2022   Eye exam for diabetics  01/03/2023   Yearly kidney function blood test for diabetes  08/02/2023   Medicare Annual Wellness Visit  08/21/2023   Colon Cancer Screening  11/24/2023   Mammogram  12/01/2023   DEXA scan (bone density measurement)  06/21/2027   Pneumonia Vaccine  Completed   Flu Shot  Completed   Hepatitis C Screening: USPSTF Recommendation to screen - Ages 69-79 yo.  Completed   Zoster (Shingles) Vaccine  Completed   HPV Vaccine  Aged Out    Advanced directives: No  Conditions/risks identified: Yes  Next appointment: Follow up in one year for your annual wellness visit.   Preventive Care 19 Years and Older, Female Preventive care refers to lifestyle choices and visits with your health care provider that can promote health and wellness. What does preventive care include? A yearly physical exam. This is also called an annual well check. Dental exams once or twice a year. Routine eye exams. Ask your health care provider how often you should have your eyes checked. Personal lifestyle choices, including: Daily care of your teeth and gums. Regular physical activity. Eating a healthy diet. Avoiding tobacco and drug use. Limiting alcohol use. Practicing safe sex. Taking low-dose aspirin every  day. Taking vitamin and mineral supplements as recommended by your health care provider. What happens during an annual well check? The services and screenings done by your health care provider during your annual well check will depend on your age, overall health, lifestyle risk factors, and family history of disease. Counseling  Your health care provider may ask you questions about your: Alcohol use. Tobacco use. Drug use. Emotional well-being. Home and relationship well-being. Sexual activity. Eating habits. History of falls. Memory and ability to understand (cognition). Work and work Statistician. Reproductive health. Screening  You may have the following tests or measurements: Height, weight, and BMI. Blood pressure. Lipid and cholesterol levels. These may be checked every 5 years, or more frequently if you are over 73 years old. Skin check. Lung cancer screening. You may have this screening every year starting at age 40 if you have a 30-pack-year history of smoking and currently smoke or have quit within the past 15 years. Fecal occult blood test (FOBT) of the stool. You may have this test every year starting at age 30. Flexible sigmoidoscopy or colonoscopy. You may have a sigmoidoscopy every 5 years or a colonoscopy every 10 years starting at age 40. Hepatitis C blood test. Hepatitis B blood test. Sexually transmitted disease (STD) testing. Diabetes screening. This is done by checking your blood sugar (glucose) after you have not eaten for a while (fasting). You may have this done every 1-3 years.  Bone density scan. This is done to screen for osteoporosis. You may have this done starting at age 16. Mammogram. This may be done every 1-2 years. Talk to your health care provider about how often you should have regular mammograms. Talk with your health care provider about your test results, treatment options, and if necessary, the need for more tests. Vaccines  Your health care  provider may recommend certain vaccines, such as: Influenza vaccine. This is recommended every year. Tetanus, diphtheria, and acellular pertussis (Tdap, Td) vaccine. You may need a Td booster every 10 years. Zoster vaccine. You may need this after age 95. Pneumococcal 13-valent conjugate (PCV13) vaccine. One dose is recommended after age 50. Pneumococcal polysaccharide (PPSV23) vaccine. One dose is recommended after age 72. Talk to your health care provider about which screenings and vaccines you need and how often you need them. This information is not intended to replace advice given to you by your health care provider. Make sure you discuss any questions you have with your health care provider. Document Released: 06/23/2015 Document Revised: 02/14/2016 Document Reviewed: 03/28/2015 Elsevier Interactive Patient Education  2017 [place] Prevention in the Home Falls can cause injuries. They can happen to people of all ages. There are many things you can do to make your home safe and to help prevent falls. What can I do on the outside of my home? Regularly fix the edges of walkways and driveways and fix any cracks. Remove anything that might make you trip as you walk through a door, such as a raised step or threshold. Trim any bushes or trees on the path to your home. Use bright outdoor lighting. Clear any walking paths of anything that might make someone trip, such as rocks or tools. Regularly check to see if handrails are loose or broken. Make sure that both sides of any steps have handrails. Any raised decks and porches should have guardrails on the edges. Have any leaves, snow, or ice cleared regularly. Use sand or salt on walking paths during winter. Clean up any spills in your garage right away. This includes oil or grease spills. What can I do in the bathroom? Use night lights. Install grab bars by the toilet and in the tub and shower. Do not use towel bars as grab  bars. Use non-skid mats or decals in the tub or shower. If you need to sit down in the shower, use a plastic, non-slip stool. Keep the floor dry. Clean up any water that spills on the floor as soon as it happens. Remove soap buildup in the tub or shower regularly. Attach bath mats securely with double-sided non-slip rug tape. Do not have throw rugs and other things on the floor that can make you trip. What can I do in the bedroom? Use night lights. Make sure that you have a light by your bed that is easy to reach. Do not use any sheets or blankets that are too big for your bed. They should not hang down onto the floor. Have a firm chair that has side arms. You can use this for support while you get dressed. Do not have throw rugs and other things on the floor that can make you trip. What can I do in the kitchen? Clean up any spills right away. Avoid walking on wet floors. Keep items that you use a lot in easy-to-reach places. If you need to reach something above you, use a strong step stool that has a grab bar.  Keep electrical cords out of the way. Do not use floor polish or wax that makes floors slippery. If you must use wax, use non-skid floor wax. Do not have throw rugs and other things on the floor that can make you trip. What can I do with my stairs? Do not leave any items on the stairs. Make sure that there are handrails on both sides of the stairs and use them. Fix handrails that are broken or loose. Make sure that handrails are as long as the stairways. Check any carpeting to make sure that it is firmly attached to the stairs. Fix any carpet that is loose or worn. Avoid having throw rugs at the top or bottom of the stairs. If you do have throw rugs, attach them to the floor with carpet tape. Make sure that you have a light switch at the top of the stairs and the bottom of the stairs. If you do not have them, ask someone to add them for you. What else can I do to help prevent  falls? Wear shoes that: Do not have high heels. Have rubber bottoms. Are comfortable and fit you well. Are closed at the toe. Do not wear sandals. If you use a stepladder: Make sure that it is fully opened. Do not climb a closed stepladder. Make sure that both sides of the stepladder are locked into place. Ask someone to hold it for you, if possible. Clearly mark and make sure that you can see: Any grab bars or handrails. First and last steps. Where the edge of each step is. Use tools that help you move around (mobility aids) if they are needed. These include: Canes. Walkers. Scooters. Crutches. Turn on the lights when you go into a dark area. Replace any light bulbs as soon as they burn out. Set up your furniture so you have a clear path. Avoid moving your furniture around. If any of your floors are uneven, fix them. If there are any pets around you, be aware of where they are. Review your medicines with your doctor. Some medicines can make you feel dizzy. This can increase your chance of falling. Ask your doctor what other things that you can do to help prevent falls. This information is not intended to replace advice given to you by your health care provider. Make sure you discuss any questions you have with your health care provider. Document Released: 03/23/2009 Document Revised: 11/02/2015 Document Reviewed: 07/01/2014 Elsevier Interactive Patient Education  2017 [employer].

## 2022-08-28 ENCOUNTER — Other Ambulatory Visit: Payer: Self-pay | Admitting: Internal Medicine

## 2022-08-28 ENCOUNTER — Other Ambulatory Visit: Payer: Self-pay

## 2022-08-28 NOTE — Progress Notes (Signed)
08/21/2022 AT 10:35 AM  Fall Screening completed: No fall risk

## 2022-08-30 ENCOUNTER — Encounter: Payer: Medicare Other | Admitting: Gynecologic Oncology

## 2022-09-05 ENCOUNTER — Inpatient Hospital Stay (HOSPITAL_BASED_OUTPATIENT_CLINIC_OR_DEPARTMENT_OTHER): Payer: Medicare Other | Admitting: Genetic Counselor

## 2022-09-05 ENCOUNTER — Other Ambulatory Visit: Payer: Self-pay | Admitting: Genetic Counselor

## 2022-09-05 ENCOUNTER — Other Ambulatory Visit: Payer: Self-pay

## 2022-09-05 ENCOUNTER — Encounter: Payer: Self-pay | Admitting: Genetic Counselor

## 2022-09-05 ENCOUNTER — Inpatient Hospital Stay: Payer: Medicare Other

## 2022-09-05 DIAGNOSIS — Z803 Family history of malignant neoplasm of breast: Secondary | ICD-10-CM | POA: Diagnosis not present

## 2022-09-05 DIAGNOSIS — Z8041 Family history of malignant neoplasm of ovary: Secondary | ICD-10-CM

## 2022-09-05 DIAGNOSIS — Z86010 Personal history of colonic polyps: Secondary | ICD-10-CM | POA: Diagnosis not present

## 2022-09-05 DIAGNOSIS — Z800 Family history of malignant neoplasm of digestive organs: Secondary | ICD-10-CM

## 2022-09-05 DIAGNOSIS — Z8042 Family history of malignant neoplasm of prostate: Secondary | ICD-10-CM

## 2022-09-05 LAB — GENETIC SCREENING ORDER

## 2022-09-05 NOTE — Progress Notes (Signed)
REFERRING PROVIDER: Lafonda Mosses, MD [address],  [place] [address]  PRIMARY PROVIDER:  Binnie Rail, MD  PRIMARY REASON FOR VISIT:  1. Family history of breast cancer   2. Family history of colon cancer   3. Family history of ovarian cancer   4. Family history of prostate cancer     HISTORY OF PRESENT ILLNESS:   Frances Gray, a 72 y.o. female, was seen for a [place] cancer genetics consultation at the request of Dr. Berline Lopes due to a family history of breast, ovarian, and colon cancers.  Frances Gray presents to clinic today to discuss the possibility of a hereditary predisposition to cancer, to discuss genetic testing, and to further clarify her future cancer risks, as well as potential cancer risks for family members.   Frances Gray is a 72 y.o. female with no personal history of cancer.    CANCER HISTORY:  Oncology History   No history exists.     RISK FACTORS:  Mammogram within the last year: yes; category b density   Colonoscopy: yes;  most recent 2020; less than 10 lifetime polyps per pt . Hysterectomy: yes.  Ovaries intact: no.  Menarche was at age 72.  First live birth at age 56.  Menopausal status: postmenopausal.  No HRT.   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, R knee s/p TKR 6/16   Diverticulitis    GERD (gastroesophageal reflux disease)    doesn't take any meds   Heart murmur    no symptoms   History of bronchitis winter of 2014   History of colon polyps    benign   History of kidney stones    Hyperlipidemia    Hypertension    takes Hyzaar daily   Perforation of sigmoid colon due to diverticulitis 09/24/2019   Pneumonia    Pre-diabetes    Vitamin D deficiency disease 08/14/2011   takes Vit D daily    Past Surgical History:  Procedure Laterality Date   BREAST BIOPSY Left    Breast  ultrasound Right 03/02/2013   The 90mm complex cyst in the right breast is probably benign. Follow-up in 6 months with bilateral mammogram and (R) ultrasound   Breast Ultrasound Right 09/02/2013   There is a benign 3 mm complex cyst in the (R) breast at 9 o'clock   COLONOSCOPY     CYSTOSCOPY  07/25/2022   Procedure: CYSTOSCOPY;  Surgeon: Lafonda Mosses, MD;  Location: [facility];  Service: Gynecology;;   [place]   tumor removed   (benign)   KNEE ARTHROSCOPY     bilateral   LAPAROSCOPIC LOW ANTERIOR RESECTION     robotic, with Dr. Johney Maine in 2023   LITHOTRIPSY  08/2011   R ureteral stone   POLYPECTOMY     PROCTOSCOPY N/A 08/24/2021   Procedure: RIGID PROCTOSCOPY;  Surgeon: Michael Boston, MD;  Location: [facility];  Service: General;  Laterality: N/A;   ROBOTIC ASSISTED SALPINGO OOPHERECTOMY Right 07/25/2022   Procedure: XI ROBOTIC ASSISTED UNILATERAL SALPINGO OOPHORECTOMY;  Surgeon: Lafonda Mosses, MD;  Location: [facility];  Service: Gynecology;  Laterality: Right;   ROBOTIC ASSISTED TOTAL HYSTERECTOMY N/A 07/25/2022   Procedure: XI ROBOTIC ASSISTED TOTAL HYSTERECTOMY;LYSIS OF ADHESIONS;  Surgeon: Lafonda Mosses, MD;  Location: [facility];  Service: Gynecology;  Laterality: N/A;   TOTAL KNEE ARTHROPLASTY Left 11/16/2012   Procedure: TOTAL KNEE ARTHROPLASTY- left;  Surgeon: Johnny Bridge, MD;  Location: [facility];  Service: Orthopedics;  Laterality: Left;   TOTAL KNEE ARTHROPLASTY Right 12/06/2014   Procedure: TOTAL KNEE ARTHROPLASTY;  Surgeon: Marchia Bond, MD;  Location: [facility];  Service: Orthopedics;  Laterality: Right;   TUBAL LIGATION      FAMILY HISTORY:  We obtained a detailed, 4-generation family history.  Significant diagnoses are listed below: Family History  Problem Relation Age of Onset   Ovarian cancer Mother 65   Colon cancer Sister 64   Breast cancer Sister 71   Prostate cancer Cousin        mets     Ms. Bute is unaware of previous family history of  genetic testing for hereditary cancer risks.  Affected relatives are unavailable for genetic testing at this time.   There is no reported Ashkenazi Jewish ancestry. There is no known consanguinity.  GENETIC COUNSELING ASSESSMENT: Frances Gray is a 72 y.o. female with a family history of cancer which is somewhat suggestive of a hereditary cancer syndrome given the presence of related cancers (breast, ovarian, colon, prostate) in the family at young ages. We, therefore, discussed and recommended the following at today's visit.   DISCUSSION: We discussed that 5 - 10% of cancer is hereditary.  Most cases of hereditary breast and ovarian cancer are associated with mutations in BRCA1/2.  Most hereditary colon cancer is associated with mutations in the Lynch syndrome genes.  There are other genes that can be associated with hereditary breast, ovarian, colon, or prostate cancer syndromes.  We discussed that testing is beneficial for several reasons including knowing how to follow individuals for their cancer risks and understanding if other family members could be at risk for cancer and allowing them to undergo genetic testing.   We reviewed the characteristics, features and inheritance patterns of hereditary cancer syndromes. We also discussed genetic testing, including the appropriate family members to test, the process of testing, insurance coverage and turn-around-time for results. We discussed the implications of a negative, positive, carrier and/or variant of uncertain significant result. We recommended Frances Gray pursue genetic testing for a panel that includes genes associated with breast, ovarian, prostate, colon, and other cancers.   The Multi-Cancer + RNA Panel offered by [facility] includes sequencing and/or deletion/duplication analysis of the following 70 genes:  AIP*, ALK, APC*, ATM*, AXIN2*, BAP1*, BARD1*, BLM*, BMPR1A*, BRCA1*, BRCA2*, BRIP1*, CDC73*, CDH1*, CDK4, CDKN1B*, CDKN2A, CHEK2*, CTNNA1*,  DICER1*, EPCAM (del/dup only), EGFR, FH*, FLCN*, GREM1 (promoter dup only), HOXB13, KIT, LZTR1, MAX*, MBD4, MEN1*, MET, MITF, MLH1*, MSH2*, MSH3*, MSH6*, MUTYH*, NF1*, NF2*, NTHL1*, PALB2*, PDGFRA, PMS2*, POLD1*, POLE*, POT1*, PRKAR1A*, PTCH1*, PTEN*, RAD51C*, RAD51D*, RB1*, RET, SDHA* (sequencing only), SDHAF2*, SDHB*, SDHC*, SDHD*, SMAD4*, SMARCA4*, SMARCB1*, SMARCE1*, STK11*, SUFU*, TMEM127*, TP53*, TSC1*, TSC2*, VHL*. RNA analysis is performed for * genes.  Based on Frances Gray family history of cancer, she meets medical criteria for genetic testing.  We discussed the Genetic Information Non-Discrimination Act (GINA) of 2008, which helps protect individuals against genetic discrimination based on their genetic test results.  It impacts Gray health insurance and employment.  With health insurance, it protects against genetic test results being used for increased premiums or policy termination. For employment, it protects against hiring, firing and promoting decisions based on genetic test results.  GINA does not apply to those in the [employer], those who work  for companies with less than 15 employees, and new life insurance or long-term disability insurance policies.  Health status due to a cancer diagnosis is not protected under GINA.  PLAN: After considering the risks, benefits, and limitations, Frances Gray provided informed consent to pursue genetic testing and the blood sample was sent to [facility] for analysis of the Multi-Cancer +RNA Panel. Results should be available within approximately 3 weeks' time, at which point they will be disclosed by telephone to Frances Gray, as will any additional recommendations warranted by these results. Frances Gray will receive a summary of her genetic counseling visit and a copy of her results once available. This information will also be available in Epic.   Based on Frances Gray family history, we recommended her sister, who was diagnosed with breast and colon  cancer in her 47s, have genetic counseling and testing. Frances Gray sister lives out of state, but Frances Gray can let us know if we can be of any assistance in coordinating genetic counseling for this family member.   Frances Gray questions were answered to her satisfaction today. Our contact information was provided should additional questions or concerns arise. Thank you for the referral and allowing [place] to share in the care of your patient.   Frances Gray, [place], [facility] Genetic Counselor [email or web address] ([phone]  The patient was seen for a total of 40 minutes in face-to-face genetic counseling.  The patient was accompanied by her husband, Frances Gray.  Drs. Lindi Adie and/or Burr Medico were available to discuss this case as needed.    ______________________________________________________________________ For Office Staff:  Number of people involved in session: 2 Was an Intern/ student involved with case: no

## 2022-09-17 ENCOUNTER — Encounter: Payer: Self-pay | Admitting: Genetic Counselor

## 2022-09-17 ENCOUNTER — Telehealth: Payer: Self-pay | Admitting: Genetic Counselor

## 2022-09-17 DIAGNOSIS — Z1379 Encounter for other screening for genetic and chromosomal anomalies: Secondary | ICD-10-CM | POA: Insufficient documentation

## 2022-09-17 NOTE — Telephone Encounter (Signed)
Contacted patient in attempt to disclose results of genetic testing.  LVM with contact information requesting a call back.  

## 2022-09-30 ENCOUNTER — Ambulatory Visit: Payer: Self-pay | Admitting: Genetic Counselor

## 2022-09-30 ENCOUNTER — Telehealth: Payer: Self-pay | Admitting: Genetic Counselor

## 2022-09-30 DIAGNOSIS — Z803 Family history of malignant neoplasm of breast: Secondary | ICD-10-CM

## 2022-09-30 DIAGNOSIS — Z8041 Family history of malignant neoplasm of ovary: Secondary | ICD-10-CM

## 2022-09-30 DIAGNOSIS — Z8042 Family history of malignant neoplasm of prostate: Secondary | ICD-10-CM

## 2022-09-30 DIAGNOSIS — Z1379 Encounter for other screening for genetic and chromosomal anomalies: Secondary | ICD-10-CM

## 2022-09-30 DIAGNOSIS — Z800 Family history of malignant neoplasm of digestive organs: Secondary | ICD-10-CM

## 2022-09-30 NOTE — Progress Notes (Signed)
HPI:   [name] was previously seen in the [facility] clinic due to a family history of breast/ovarian/colon and other cancers and concerns regarding a hereditary predisposition to cancer. Please refer to our prior cancer Gray clinic note for more information regarding our discussion, assessment and recommendations, at the time. [name] recent genetic test results were disclosed to her, as were recommendations warranted by these results. These results and recommendations are discussed in more detail below.  CANCER HISTORY:  [name] is a 72 y.o. female with no personal history of cancer.     FAMILY HISTORY:  We obtained a detailed, 4-generation family history.  Significant diagnoses are listed below:      Family History  Problem Relation Age of Onset   Ovarian cancer Mother 66   Colon cancer Sister 15   Breast cancer Sister 47   Prostate cancer Cousin          mets       [name] is unaware of previous family history of genetic testing for hereditary cancer risks.  Affected relatives are unavailable for genetic testing at this time.    There is no reported Ashkenazi Jewish ancestry. There is no known consanguinity.  GENETIC TEST RESULTS:  The [facility] Multi-Cancer +RNA Panel found no pathogenic mutations.   The Multi-Cancer + RNA Panel offered by [facility] includes sequencing and/or deletion/duplication analysis of the following 70 genes:  AIP*, ALK, APC*, ATM*, AXIN2*, BAP1*, BARD1*, BLM*, BMPR1A*, BRCA1*, BRCA2*, BRIP1*, CDC73*, CDH1*, CDK4, CDKN1B*, CDKN2A, CHEK2*, CTNNA1*, DICER1*, EPCAM (del/dup only), EGFR, FH*, FLCN*, GREM1 (promoter dup only), HOXB13, KIT, LZTR1, MAX*, MBD4, MEN1*, MET, MITF, MLH1*, MSH2*, MSH3*, MSH6*, MUTYH*, NF1*, NF2*, NTHL1*, PALB2*, PDGFRA, PMS2*, POLD1*, POLE*, POT1*, PRKAR1A*, PTCH1*, PTEN*, RAD51C*, RAD51D*, RB1*, RET, SDHA* (sequencing only), SDHAF2*, SDHB*, SDHC*, SDHD*, SMAD4*, SMARCA4*, SMARCB1*, SMARCE1*, STK11*, SUFU*,  TMEM127*, TP53*, TSC1*, TSC2*, VHL*. RNA analysis is performed for * genes.   The test report has been scanned into EPIC and is located under the Molecular Pathology section of the Results Review tab.  A portion of the result report is included below for reference. Genetic testing reported out on September 13, 2022.     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  At this time, it is unknown if this variant is associated with an increased risk for cancer or if it is benign, but most uncertain variants are reclassified to benign. It should not be used to make medical management decisions. With time, we suspect the laboratory will determine the significance of this variant, if any. If the laboratory reclassifies this variant, we will attempt to contact [name] to discuss it further.   Even though a pathogenic variant was not identified, possible explanations for the cancer in the family may include: There may be no hereditary risk for cancer in the family. The cancers in [name] family may be sporadic/familial or due to other genetic and environmental factors. There may be a gene mutation in one of these genes that current testing methods cannot detect but that chance is small. There could be another gene that has not yet been discovered, or that we have not yet tested, that is responsible for the cancer diagnoses in the family.  It is also possible there is a hereditary cause for the cancer in the family that [name] did not inherit.   Therefore, it is important to remain in touch with cancer Gray  in the future so that we can continue to offer [name] the most up to date genetic testing.    ADDITIONAL GENETIC TESTING:  We discussed with [name] that her genetic testing was fairly extensive.  If there are additional relevant genes identified to increase cancer risk that can be analyzed in the future, we would be  happy to discuss and coordinate this testing at that time.     CANCER SCREENING RECOMMENDATIONS:  [name] test result is considered negative (normal).  This means that we have not identified a hereditary cause for her family history of cancer at this time.   An individual's cancer risk and medical management are not determined by genetic test results alone. Overall cancer risk assessment incorporates additional factors, including personal medical history, family history, and any available genetic information that may result in a personalized plan for cancer prevention and surveillance. Therefore, it is recommended she continue to follow the cancer management and screening guidelines provided by her oncology and primary healthcare provider.  RECOMMENDATIONS FOR FAMILY MEMBERS:   Since she did not inherit a identifiable mutation in a cancer predisposition gene included on this panel, her children could not have inherited a known mutation from her in one of these genes. Individuals in this family might be at some increased risk of developing cancer, over the general population risk, due to the family history of cancer.  Individuals in the family should notify their providers of the family history of cancer. We recommend women in this family have a yearly mammogram beginning at age 75, or 67 years younger than the earliest onset of cancer, an annual clinical breast exam, and perform monthly breast self-exams.   First degree relatives of those with colon cancer should receive colonoscopies beginning at age 29, or 10 years prior to the earliest diagnosis of colon cancer in the family, and receive colonoscopies at least every 5 years, or as recommended by their gastroenterologist.   Other members of the family may still carry a pathogenic variant in one of these genes that Ms. Bing did not inherit. Based on the family history, we recommend her sister, who was diagnosed with breast and colon cancer, have  genetic counseling and testing. Ms. Harbour will let us know if we can be of any assistance in coordinating genetic counseling and/or testing for this family member.   We do not recommend familial testing for the POLE variant of uncertain significance (VUS).  FOLLOW-UP:  Lastly, we discussed with Ms. Goeden that cancer Gray is a rapidly advancing field and it is possible that new genetic tests will be appropriate for her and/or her family members in the future. We encouraged her to remain in contact with cancer Gray on an annual basis so we can update her personal and family histories and let her know of advances in cancer Gray that may benefit this family.   Our contact number was provided. Ms. Rudden questions were answered to her satisfaction, and she knows she is welcome to call us at anytime with additional questions or concerns.   Adolpho Meenach M. Rennie Plowman, MS, [facility] Genetic Counselor [email or web address] (P) [phone]

## 2022-09-30 NOTE — Telephone Encounter (Signed)
Disclosed negative genetics and VUS in POLE.  Recommended genetic testing for sister, Darel Hong, given her history of breast and colon cancer.

## 2022-11-21 ENCOUNTER — Other Ambulatory Visit: Payer: Self-pay | Admitting: Internal Medicine

## 2022-11-25 ENCOUNTER — Encounter: Payer: Self-pay | Admitting: Internal Medicine

## 2022-11-25 NOTE — Progress Notes (Unsigned)
Subjective:    Patient ID: Frances Gray, female    DOB: 11/24/1950, 72 y.o.   MRN: [id number]     HPI Frances Gray is here for follow up of her chronic medical problems.  Granddaughter died in mva recently.   She is doing okay.  Medications and allergies reviewed with patient and updated if appropriate.  Current Outpatient Medications on File Prior to Visit  Medication Sig Dispense Refill   Biotin 10 MG TABS Take 10 mg by mouth daily.     calcium carbonate (CALCIUM 600) 600 MG TABS tablet Take 1 tablet (600 mg total) by mouth 2 (two) times daily with a meal. 60 tablet 6   cetirizine (ZYRTEC) 10 MG tablet Take 10 mg by mouth as needed for allergies.     Cholecalciferol (VITAMIN D3) 2000 UNITS TABS Take 2,000 Units by mouth every morning.      clobetasol (OLUX) 0.05 % topical foam Apply 1 Application topically every other day.     finasteride (PROSCAR) 5 MG tablet Take 5 mg by mouth daily.     fluticasone (FLONASE) 50 MCG/ACT nasal spray Place 1 spray into both nostrils daily as needed for allergies or rhinitis.   0   losartan-hydrochlorothiazide (HYZAAR) 100-25 MG tablet TAKE 1 TABLET BY MOUTH EVERY DAY 90 tablet 1   Multiple Vitamin (MULTIVITAMIN) tablet Take 1 tablet by mouth every morning.      Multiple Vitamins-Minerals (ZINC PO) Take 1 tablet by mouth daily.     polyethylene glycol (MIRALAX) 17 g packet Take 17 g by mouth daily. 30 each 0   potassium chloride (KLOR-CON M) 10 MEQ tablet Take 1 tablet (10 mEq total) by mouth 2 (two) times daily. 6 tablet 0   rosuvastatin (CRESTOR) 5 MG tablet TAKE 1 TABLET BY MOUTH TWICE WEEKLY 24 tablet 1   sennosides-docusate sodium (SENOKOT-S) 8.6-50 MG tablet Take 2 tablets by mouth at bedtime. For AFTER surgery, do not take if having diarrhea     No current facility-administered medications on file prior to visit.     Review of Systems  Constitutional:  Negative for fever.  Respiratory:  Negative for cough, shortness of breath and  wheezing.   Cardiovascular:  Negative for chest pain, palpitations and leg swelling.  Gastrointestinal:  Positive for constipation (mild).  Neurological:  Positive for headaches (stress related). Negative for light-headedness.       Objective:   Vitals:   11/26/22 0950  BP: 130/80  Pulse: (!) 56  Temp: 98.1 F (36.7 C)  SpO2: 98%   BP Readings from Last 3 Encounters:  11/26/22 130/80  08/21/22 130/70  08/20/22 (!) 150/70   Wt Readings from Last 3 Encounters:  11/26/22 267 lb 9.6 oz (121.4 kg)  08/21/22 265 lb 3.2 oz (120.3 kg)  08/20/22 264 lb (119.7 kg)   Body mass index is 45.93 kg/m.    Physical Exam Constitutional:      General: She is not in acute distress.    Appearance: Normal appearance.  HENT:     Head: Normocephalic and atraumatic.  Eyes:     Conjunctiva/sclera: Conjunctivae normal.  Cardiovascular:     Rate and Rhythm: Normal rate and regular rhythm.     Heart sounds: Normal heart sounds.  Pulmonary:     Effort: Pulmonary effort is normal. No respiratory distress.     Breath sounds: Normal breath sounds. No wheezing.  Musculoskeletal:     Cervical back: Neck supple.     Right  lower leg: No edema.     Left lower leg: No edema.  Lymphadenopathy:     Cervical: No cervical adenopathy.  Skin:    General: Skin is warm and dry.     Findings: No rash.  Neurological:     Mental Status: She is alert. Mental status is at baseline.  Psychiatric:        Mood and Affect: Mood normal.        Behavior: Behavior normal.       Diabetic Foot Exam - Simple   Simple Foot Form Diabetic Foot exam was performed with the following findings: Yes 11/26/2022 10:17 AM  Visual Inspection No deformities, no ulcerations, no other skin breakdown bilaterally: Yes Sensation Testing Intact to touch and monofilament testing bilaterally: Yes Pulse Check Posterior Tibialis and Dorsalis pulse intact bilaterally: Yes Comments      Lab Results  Component Value Date   WBC  8.8 08/01/2022   HGB 12.4 08/01/2022   HCT 37.9 08/01/2022   PLT 356 08/01/2022   GLUCOSE 127 (H) 08/01/2022   CHOL 205 (H) 05/29/2022   TRIG 101.0 05/29/2022   HDL 63.20 05/29/2022   LDLCALC 122 (H) 05/29/2022   ALT 28 07/23/2022   AST 24 07/23/2022   NA 140 08/01/2022   K 4.4 08/01/2022   CL 102 08/01/2022   CREATININE 0.64 08/01/2022   BUN 14 08/01/2022   CO2 33 (H) 08/01/2022   TSH 2.29 05/29/2022   INR 1.2 06/12/2021   HGBA1C 7.2 (H) 05/29/2022   MICROALBUR <0.7 11/27/2021     Assessment & Plan:    See Problem List for Assessment and Plan of chronic medical problems.

## 2022-11-25 NOTE — Patient Instructions (Addendum)
      Blood work was ordered.   The lab is on the first floor.    Medications changes include :   none    A referral was ordered nutrition and someone will call you to schedule an appointment.     Return in about 6 months (around 05/28/2023) for follow up.

## 2022-11-26 ENCOUNTER — Ambulatory Visit (INDEPENDENT_AMBULATORY_CARE_PROVIDER_SITE_OTHER): Payer: Medicare Other | Admitting: Internal Medicine

## 2022-11-26 VITALS — BP 130/80 | HR 56 | Temp 98.1°F | Ht 64.0 in | Wt 267.6 lb

## 2022-11-26 DIAGNOSIS — E559 Vitamin D deficiency, unspecified: Secondary | ICD-10-CM

## 2022-11-26 DIAGNOSIS — E119 Type 2 diabetes mellitus without complications: Secondary | ICD-10-CM | POA: Diagnosis not present

## 2022-11-26 DIAGNOSIS — I10 Essential (primary) hypertension: Secondary | ICD-10-CM | POA: Diagnosis not present

## 2022-11-26 DIAGNOSIS — E7849 Other hyperlipidemia: Secondary | ICD-10-CM

## 2022-11-26 LAB — COMPREHENSIVE METABOLIC PANEL
ALT: 22 U/L (ref 0–35)
AST: 22 U/L (ref 0–37)
Albumin: 3.8 g/dL (ref 3.5–5.2)
Alkaline Phosphatase: 73 U/L (ref 39–117)
BUN: 12 mg/dL (ref 6–23)
CO2: 30 mEq/L (ref 19–32)
Calcium: 9.7 mg/dL (ref 8.4–10.5)
Chloride: 98 mEq/L (ref 96–112)
Creatinine, Ser: 0.63 mg/dL (ref 0.40–1.20)
GFR: 88.27 mL/min (ref >60.00)
Glucose, Bld: 107 mg/dL — ABNORMAL HIGH (ref 70–99)
Potassium: 3.6 mEq/L (ref 3.5–5.1)
Sodium: 136 mEq/L (ref 135–145)
Total Bilirubin: 0.4 mg/dL (ref 0.2–1.2)
Total Protein: 8.2 g/dL (ref 6.0–8.3)

## 2022-11-26 LAB — MICROALBUMIN / CREATININE URINE RATIO
Creatinine,U: 83.1 mg/dL
Microalb Creat Ratio: 0.8 mg/g (ref 0.0–30.0)
Microalb, Ur: <0.7 mg/dL (ref 0.0–1.9)

## 2022-11-26 LAB — CBC WITH DIFFERENTIAL/PLATELET
Basophils Absolute: 0 10*3/uL (ref 0.0–0.1)
Basophils Relative: 0.7 % (ref 0.0–3.0)
Eosinophils Absolute: 0.2 10*3/uL (ref 0.0–0.7)
Eosinophils Relative: 3.2 % (ref 0.0–5.0)
HCT: 40.4 % (ref 36.0–46.0)
Hemoglobin: 13 g/dL (ref 12.0–15.0)
Lymphocytes Relative: 22 % (ref 12.0–46.0)
Lymphs Abs: 1.4 10*3/uL (ref 0.7–4.0)
MCHC: 32 g/dL (ref 30.0–36.0)
MCV: 85.5 fl (ref 78.0–100.0)
Monocytes Absolute: 0.6 10*3/uL (ref 0.1–1.0)
Monocytes Relative: 9.1 % (ref 3.0–12.0)
Neutro Abs: 4 10*3/uL (ref 1.4–7.7)
Neutrophils Relative %: 65 % (ref 43.0–77.0)
Platelets: 352 10*3/uL (ref 150.0–400.0)
RBC: 4.73 Mil/uL (ref 3.87–5.11)
RDW: 13.7 % (ref 11.5–15.5)
WBC: 6.1 10*3/uL (ref 4.0–10.5)

## 2022-11-26 LAB — LIPID PANEL
Cholesterol: 197 mg/dL (ref 0–200)
HDL: 57.8 mg/dL (ref >39.00)
LDL Cholesterol: 117 mg/dL — ABNORMAL HIGH (ref 0–99)
NonHDL: 139.49
Total CHOL/HDL Ratio: 3
Triglycerides: 111 mg/dL (ref 0.0–149.0)
VLDL: 22.2 mg/dL (ref 0.0–40.0)

## 2022-11-26 LAB — HEMOGLOBIN A1C: Hgb A1c MFr Bld: 6.9 % — ABNORMAL HIGH (ref 4.6–6.5)

## 2022-11-26 NOTE — Assessment & Plan Note (Addendum)
Chronic   Lab Results  Component Value Date   HGBA1C 7.2 (H) 05/29/2022   Sugars not ideally controlled Check A1c, urine microalbumin Stressed regular exercise, diabetic diet Currently lifestyle controlled Depending on A1c result may need medication Interested in nutrition referral-referred today

## 2022-11-26 NOTE — Assessment & Plan Note (Signed)
Chronic Taking vitamin D daily Continue vitamin D supplementation

## 2022-11-26 NOTE — Assessment & Plan Note (Signed)
Chronic Regular exercise and healthy diet encouraged Check lipid panel, CMP Continue Crestor 5 mg twice weekly 

## 2022-11-26 NOTE — Assessment & Plan Note (Addendum)
Chronic Stressed weight loss Discussed diabetic medications that may help with weight loss Discussed nutrition referral and she thinks that would be helpful-referred Stressed regular exercise Stressed healthy diet low in sugars/carbs and small portions

## 2022-11-26 NOTE — Assessment & Plan Note (Addendum)
Chronic Blood pressure well controlled CMP, CBC Continue losartan-HCTZ 100-25 mg daily

## 2022-12-05 DIAGNOSIS — Z1231 Encounter for screening mammogram for malignant neoplasm of breast: Secondary | ICD-10-CM | POA: Diagnosis not present

## 2022-12-05 LAB — HM MAMMOGRAPHY

## 2022-12-06 ENCOUNTER — Encounter: Payer: Self-pay | Admitting: Internal Medicine

## 2022-12-27 ENCOUNTER — Emergency Department (HOSPITAL_BASED_OUTPATIENT_CLINIC_OR_DEPARTMENT_OTHER)
Admission: EM | Admit: 2022-12-27 | Discharge: 2022-12-27 | Disposition: A | Discharge status: Home / Self Care | Attending: Emergency Medicine | Admitting: Emergency Medicine

## 2022-12-27 ENCOUNTER — Emergency Department (HOSPITAL_BASED_OUTPATIENT_CLINIC_OR_DEPARTMENT_OTHER)

## 2022-12-27 ENCOUNTER — Other Ambulatory Visit: Payer: Self-pay

## 2022-12-27 ENCOUNTER — Encounter (HOSPITAL_BASED_OUTPATIENT_CLINIC_OR_DEPARTMENT_OTHER): Payer: Self-pay | Admitting: Emergency Medicine

## 2022-12-27 DIAGNOSIS — E119 Type 2 diabetes mellitus without complications: Secondary | ICD-10-CM | POA: Diagnosis not present

## 2022-12-27 DIAGNOSIS — Z20822 Contact with and (suspected) exposure to covid-19: Secondary | ICD-10-CM | POA: Insufficient documentation

## 2022-12-27 DIAGNOSIS — B9789 Other viral agents as the cause of diseases classified elsewhere: Secondary | ICD-10-CM | POA: Diagnosis not present

## 2022-12-27 DIAGNOSIS — Z79899 Other long term (current) drug therapy: Secondary | ICD-10-CM | POA: Insufficient documentation

## 2022-12-27 DIAGNOSIS — R059 Cough, unspecified: Secondary | ICD-10-CM | POA: Diagnosis not present

## 2022-12-27 DIAGNOSIS — Z96653 Presence of artificial knee joint, bilateral: Secondary | ICD-10-CM | POA: Diagnosis not present

## 2022-12-27 DIAGNOSIS — J029 Acute pharyngitis, unspecified: Secondary | ICD-10-CM | POA: Diagnosis not present

## 2022-12-27 DIAGNOSIS — I10 Essential (primary) hypertension: Secondary | ICD-10-CM | POA: Diagnosis not present

## 2022-12-27 DIAGNOSIS — J069 Acute upper respiratory infection, unspecified: Secondary | ICD-10-CM

## 2022-12-27 DIAGNOSIS — Z87891 Personal history of nicotine dependence: Secondary | ICD-10-CM | POA: Insufficient documentation

## 2022-12-27 DIAGNOSIS — R0602 Shortness of breath: Secondary | ICD-10-CM | POA: Diagnosis not present

## 2022-12-27 LAB — RESP PANEL BY RT-PCR (RSV, FLU A&B, COVID)  RVPGX2
Influenza A by PCR: NEGATIVE
Influenza B by PCR: NEGATIVE
Resp Syncytial Virus by PCR: NEGATIVE
SARS Coronavirus 2 by RT PCR: NEGATIVE

## 2022-12-27 LAB — GROUP A STREP BY PCR: Group A Strep by PCR: NOT DETECTED

## 2022-12-27 MED ORDER — BENZONATATE 200 MG PO CAPS: 200.0000 mg | ORAL_CAPSULE | Freq: Three times a day (TID) | ORAL | 0 refills | Status: DC | PRN

## 2022-12-27 NOTE — ED Triage Notes (Signed)
Pt reports having a sore throat Thursday and has started having a cough, ear pain, runny nose.  Denies any fevers or contact w anyone who has been sick.

## 2022-12-27 NOTE — Discharge Instructions (Addendum)
We evaluated you for your sore throat, runny nose and cough.  Your symptoms are most likely due to a viral upper respiratory tract infection.  I would recommend taking 1000 mg of Tylenol every 6 hours for sore throat and bodyaches.  You can also try hot tea with lemon and honey for your sore throat.  I prescribed you some cough medication which you can take up to 3 times a day as needed for cough.  Your symptoms should improve on their own.  If you have any new or worsening symptoms such as severe pain, difficulty breathing, productive cough, trouble swallowing or speaking, or any other new symptoms, please return to the emergency department for reassessment.

## 2022-12-27 NOTE — ED Provider Notes (Signed)
[place] [facility] Provider Note  CSN: [id number] Arrival date & time: 12/27/22 1914  Chief Complaint(s) Sore Throat  HPI Frances Gray is a 72 y.o. female history of hypertension, hyperlipidemia presenting to the emergency department with sore throat.  Patient reports sore throat for 2 days.  Reports she is tolerating fluids and eating okay.  Reports associated runny nose, cough.  No productive cough.  No sick contacts.  No fevers or chills.  No body aches.  No chest pain, shortness of breath.  No difficulty breathing.  Symptoms mild.   Past Medical History 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, R knee s/p TKR 6/16   Diverticulitis    GERD (gastroesophageal reflux disease)    doesn't take any meds   Heart murmur    no symptoms   History of bronchitis winter of 2014   History of colon polyps    benign   History of kidney stones    Hyperlipidemia    Hypertension    takes Hyzaar daily   Perforation of sigmoid colon due to diverticulitis 09/24/2019   Pneumonia    Pre-diabetes    Vitamin D deficiency disease 08/14/2011   takes Vit D daily   Patient Active Problem List   Diagnosis Date Noted   Genetic testing 09/17/2022   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   Pelvic adhesions 07/25/2022   Pain in right lower leg 03/04/2022   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   Pericolonic abscess due to diverticulitis 08/24/2021   Rectosigmoid diverticulitis s/p rectosigmoid resection 08/2021 07/17/2021   Cherry angioma 05/16/2021   Rib pain on right side 01/03/2021   Sciatica of left side 01/03/2021   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   Melasma 12/14/2020   Hiatal hernia, small 11/21/2020   Hyperparathyroidism (HCC) 05/23/2020   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 Authorizing Provider  benzonatate (TESSALON) 200 MG capsule Take 1 capsule (200 mg total) by mouth 3 (three) times daily as needed for cough. 12/27/22  Yes Lonell Grandchild, MD  Biotin 10 MG TABS Take 10 mg by mouth daily.    [provider]  calcium carbonate (CALCIUM 600) 600 MG TABS tablet Take 1 tablet (600 mg total) by mouth 2 (two) times daily with a meal. 05/12/14   Newt Lukes, MD  cetirizine (ZYRTEC) 10 MG tablet Take 10 mg by mouth as needed for allergies.    [provider]  Cholecalciferol (VITAMIN D3) 2000 UNITS TABS Take 2,000 Units by mouth every morning.     [provider]  clobetasol (OLUX) 0.05 % topical foam Apply 1 Application topically every other day. 10/26/21   [provider]  finasteride (PROSCAR) 5 MG tablet Take 5 mg by mouth daily. 10/18/19   [provider]  fluticasone (FLONASE) 50 MCG/ACT nasal spray Place 1 spray into both nostrils daily as needed for allergies or rhinitis.  10/05/14   [provider]  losartan-hydrochlorothiazide (HYZAAR) 100-25 MG tablet TAKE 1 TABLET BY MOUTH EVERY DAY 11/21/22   Pincus Sanes, MD  Multiple Vitamin (MULTIVITAMIN) tablet Take 1 tablet by mouth every morning.     [provider]  Multiple Vitamins-Minerals (ZINC PO) Take 1 tablet by mouth daily.    [provider]   potassium chloride (KLOR-CON M) 10 MEQ tablet Take 1 tablet (10 mEq total) by mouth 2 (two) times daily. 07/26/22   Cross, Efraim Kaufmann D, NP  rosuvastatin (CRESTOR) 5 MG tablet TAKE 1 TABLET BY MOUTH TWICE WEEKLY 08/28/22   Burns, Bobette Mo, MD  sennosides-docusate sodium (SENOKOT-S) 8.6-50 MG tablet Take 2 tablets by mouth at bedtime. For AFTER surgery, do not take if having diarrhea 07/26/22   Warner Mccreedy D, NP                                                                                                                                    Past Surgical History Past Surgical History:  Procedure Laterality Date   BREAST BIOPSY Left    Breast ultrasound Right 03/02/2013   The 4mm complex cyst in the right breast is probably benign. Follow-up in 6 months with bilateral mammogram and (R) ultrasound   Breast Ultrasound Right 09/02/2013   There is a benign 3 mm complex cyst in the (R) breast at 9 o'clock   COLONOSCOPY     CYSTOSCOPY  07/25/2022   Procedure: CYSTOSCOPY;  Surgeon: Carver Fila, MD;  Location: [facility];  Service: Gynecology;;   ESOPHAGUS SURGERY  1996   tumor removed   (benign)   KNEE ARTHROSCOPY     bilateral   LAPAROSCOPIC LOW ANTERIOR RESECTION     robotic, with Dr. Michaell Cowing in 2023   LITHOTRIPSY  08/2011   R ureteral stone   POLYPECTOMY     PROCTOSCOPY N/A 08/24/2021   Procedure: RIGID PROCTOSCOPY;  Surgeon: Karie Soda, MD;  Location: [facility];  Service: General;  Laterality: N/A;   ROBOTIC ASSISTED SALPINGO OOPHERECTOMY Right 07/25/2022   Procedure: XI ROBOTIC ASSISTED UNILATERAL SALPINGO OOPHORECTOMY;  Surgeon: Carver Fila, MD;  Location: [facility];  Service: Gynecology;  Laterality: Right;   ROBOTIC ASSISTED TOTAL HYSTERECTOMY N/A 07/25/2022   Procedure: XI ROBOTIC ASSISTED TOTAL HYSTERECTOMY;LYSIS OF ADHESIONS;  Surgeon: Carver Fila, MD;  Location: [facility];  Service: Gynecology;  Laterality: N/A;   TOTAL KNEE ARTHROPLASTY Left 11/16/2012   Procedure: TOTAL  KNEE ARTHROPLASTY- left;  Surgeon: Eulas Post, MD;  Location: [facility];  Service: Orthopedics;  Laterality: Left;   TOTAL KNEE ARTHROPLASTY Right 12/06/2014   Procedure: TOTAL KNEE ARTHROPLASTY;  Surgeon: Teryl Lucy, MD;  Location: [facility];  Service: Orthopedics;  Laterality: Right;   TUBAL LIGATION     Family History Family History  Problem Relation Age of Onset   Ovarian cancer Mother 31   Colon cancer Sister 23   Breast cancer Sister 72   Prostate cancer Cousin        mets   Esophageal cancer Neg Hx    Colon polyps Neg Hx    Rectal cancer Neg Hx    Stomach cancer Neg Hx    Endometrial cancer Neg Hx    Pancreatic cancer Neg Hx     Social History Social History   Tobacco Use   Smoking status: Former    Current packs/day: 0.00    Average packs/day: 0.3 packs/day for 10.0 years (2.5 ttl pk-yrs)    Types: Cigarettes    Start date: 56    Quit date: 1980    Years since quitting: 44.5   Smokeless tobacco: Never   Tobacco comments:    quit smoking 42yrs ago  Vaping Use   Vaping status: Never Used  Substance Use Topics   Alcohol use: No   Drug use: No   Allergies Amlodipine, Metformin and related, and Pravastatin  Review of Systems Review of Systems  All other systems reviewed and are negative.   Physical Exam Vital Signs  I have reviewed the triage vital signs BP (!) 177/82 (BP Location: Left Arm)   Pulse 76   Temp 98.1 F (36.7 C) (Oral)   Resp (!) 24   Ht 5\' 4"  (1.626 m)   Wt 123.4 kg   SpO2 100%   BMI 46.69 kg/m  Physical Exam Vitals and nursing note reviewed.  Constitutional:      General: She is not in acute distress.    Appearance: She is well-developed.  HENT:     Head: Normocephalic and atraumatic.     Mouth/Throat:     Mouth: Mucous membranes are moist.     Comments: Mild posterior pharyngeal erythema with no peritonsillar enlargement, uvular deviation.  Floor of mouth soft.  No facial swelling.  No facial tenderness. Eyes:     Pupils:  Pupils are equal, round, and reactive to light.  Cardiovascular:     Rate and Rhythm: Normal rate and regular rhythm.     Heart sounds: No murmur heard. Pulmonary:     Effort: Pulmonary effort is normal. No respiratory distress.     Breath sounds: Normal breath sounds.  Abdominal:     General: Abdomen is flat.     Palpations: Abdomen is soft.     Tenderness: There is no abdominal tenderness.  Musculoskeletal:        General: No tenderness.     Right lower leg: No edema.     Left lower leg: No edema.  Skin:    General: Skin is warm and dry.  Neurological:     General: No focal deficit present.     Mental Status: She is alert. Mental status is at baseline.  Psychiatric:        Mood and Affect: Mood normal.        Behavior: Behavior normal.     ED Results and Treatments Labs (all labs ordered are listed, but only abnormal results are displayed) Labs Reviewed  RESP PANEL BY RT-PCR (RSV, FLU A&B, COVID)  RVPGX2  GROUP A STREP BY PCR  Radiology DG Chest 2 View  Result Date: 12/27/2022 CLINICAL DATA:  Cough, shortness of breath, and sore throat since last night. Former smoker. EXAM: CHEST - 2 VIEW COMPARISON:  Chest 03/01/2018.  Right ribs 01/03/2021 FINDINGS: Normal heart size and pulmonary vascularity. No focal airspace disease or consolidation in the lungs. No blunting of costophrenic angles. No pneumothorax. Mediastinal contours appear intact. Degenerative changes in the spine. IMPRESSION: No active cardiopulmonary disease. Electronically Signed   By: Burman Nieves M.D.   On: 12/27/2022 21:05    Pertinent labs & imaging results that were available during my care of the patient were reviewed by me and considered in my medical decision making (see MDM for details).  Medications Ordered in ED Medications - No data to display                                                                                                                                    Procedures Procedures  (including critical care time)  Medical Decision Making / ED Course   MDM:  72 year old female presenting to the emergency department sore throat.  Patient well-appearing, physical exam unremarkable other than mild posterior pharyngeal erythema.  Patient also with rhinorrhea.  Suspect viral upper respiratory tract infection given constellation of symptoms.  COVID test is negative.  Strep test obtained by nursing also negative.  Chest x-ray was obtained in triage and is unremarkable.  Her lungs are clear on exam.  No evidence of dangerous deep space HEENT infection. Will discharge patient to home. All questions answered. Patient comfortable with plan of discharge. Return precautions discussed with patient and specified on the after visit summary.       Additional history obtained: -Additional history obtained from spouse   Lab Tests: -I ordered, reviewed, and interpreted labs.   The pertinent results include:   Labs Reviewed  RESP PANEL BY RT-PCR (RSV, FLU A&B, COVID)  RVPGX2  GROUP A STREP BY PCR    Notable for negative covid/strep  Imaging Studies ordered: I ordered imaging studies including CXR On my interpretation imaging demonstrates no acute process I independently visualized and interpreted imaging. I agree with the radiologist interpretation   Medicines ordered and prescription drug management: Meds ordered this encounter  Medications   benzonatate (TESSALON) 200 MG capsule    Sig: Take 1 capsule (200 mg total) by mouth 3 (three) times daily as needed for cough.    Dispense:  21 capsule    Refill:  0    -I have reviewed the patients home medicines and have made adjustments as needed   Social Determinants of Health:  Diagnosis or treatment significantly limited by social determinants of health: obesity   Co morbidities that complicate the  patient evaluation  Past Medical History:  Diagnosis Date   Allergic rhinitis, seasonal    takes Claritin daily as needed and uses Flonase if needed   Allergy    Anemia  Arthritis    L knee s/p TKR 6/14, R knee s/p TKR 6/16   Diverticulitis    GERD (gastroesophageal reflux disease)    doesn't take any meds   Heart murmur    no symptoms   History of bronchitis winter of 2014   History of colon polyps    benign   History of kidney stones    Hyperlipidemia    Hypertension    takes Hyzaar daily   Perforation of sigmoid colon due to diverticulitis 09/24/2019   Pneumonia    Pre-diabetes    Vitamin D deficiency disease 08/14/2011   takes Vit D daily      Dispostion: Disposition decision including need for hospitalization was considered, and patient discharged from emergency department.    Final Clinical Impression(s) / ED Diagnoses Final diagnoses:  Viral upper respiratory tract infection     This chart was dictated using voice recognition software.  Despite best efforts to proofread,  errors can occur which can change the documentation meaning.    Lonell Grandchild, MD 12/27/22 2200

## 2022-12-30 ENCOUNTER — Ambulatory Visit (INDEPENDENT_AMBULATORY_CARE_PROVIDER_SITE_OTHER): Payer: Medicare Other | Admitting: Family Medicine

## 2022-12-30 ENCOUNTER — Encounter: Payer: Self-pay | Admitting: Family Medicine

## 2022-12-30 VITALS — BP 151/57 | HR 68 | Temp 97.9°F | Ht 64.0 in | Wt 275.0 lb

## 2022-12-30 DIAGNOSIS — J069 Acute upper respiratory infection, unspecified: Secondary | ICD-10-CM

## 2022-12-30 MED ORDER — HYDROCOD POLI-CHLORPHE POLI ER 10-8 MG/5ML PO SUER: 5.0000 mL | Freq: Two times a day (BID) | ORAL | 0 refills | Status: AC | PRN

## 2022-12-30 MED ORDER — GUAIFENESIN ER 600 MG PO TB12: 1200.0000 mg | ORAL_TABLET | Freq: Two times a day (BID) | ORAL | 0 refills | Status: AC

## 2022-12-30 MED ORDER — AMOXICILLIN-POT CLAVULANATE 875-125 MG PO TABS: 1.0000 | ORAL_TABLET | Freq: Two times a day (BID) | ORAL | 0 refills | Status: DC

## 2022-12-30 NOTE — Patient Instructions (Signed)
Your symptoms could be viral, but given some "double sickening" over the weekend, I will send in an antibiotic for you to take if not feeling better within the next day or so. Adding Mucinex and as needed cough syrup - this could make you drowsy. Continue supportive measures including rest, hydration, humidifier use, steam showers, warm compresses to sinuses, warm liquids with lemon and honey, and over-the-counter cough, cold, and analgesics as needed.   BP is high today - you need to follow-up with your primary care doctor for this in 2-4 weeks. Try monitoring at home occasionally if you are able.   Please contact office for follow-up if symptoms do not improve or worsen. Seek emergency care if symptoms become severe.

## 2022-12-30 NOTE — Progress Notes (Signed)
Acute Office Visit  Subjective:     Patient ID: Frances Gray, female    DOB: [birth year], 72 y.o.   MRN: [id number]  Chief Complaint  Patient presents with   Cough   Nasal Congestion     Patient is in today for URI symptoms.   Discussed the use of AI scribe software for clinical note transcription with the patient, who gave verbal consent to proceed.  History of Present Illness   The patient is here with symptoms that began on Wednesday night, initially manifesting as a severe sore throat. They sought medical attention on Friday [facility] ED), at which point they were prescribed Benzonatate. The patient reported feeling better on Saturday, but by Saturday night, they developed a persistent cough, particularly when lying down. The cough has continued, accompanied by a slightly sore throat. The patient described the cough as productive, with yellowish sputum. They denied experiencing sinus pressure, congestion, fever, or headaches. However, they reported ear pain, predominantly in the right ear.  The patient has been taking over-the-counter medications, including Coricidin and Safetussin, but these have provided limited relief. The cough has been disruptive to their sleep, causing them to wake frequently throughout the night. The patient also reported occasional wheezing.  The patient's symptoms initially improved but then worsened, which may suggest a bacterial infection. However, recent tests for strep, flu, and COVID were all negative, and a chest x-ray was normal.        ;     All review of systems negative except what is listed in the HPI      Objective:    BP (!) 151/57   Pulse 68   Temp 97.9 F (36.6 C) (Oral)   Ht 5\' 4"  (1.626 m)   Wt 275 lb (124.7 kg)   SpO2 99%   BMI 47.20 kg/m    Physical Exam Vitals reviewed.  Constitutional:      Appearance: Normal appearance.  HENT:     Right Ear: Ear canal normal. Tympanic membrane is bulging.     Left Ear: There is  impacted cerumen.     Nose: Congestion present.     Mouth/Throat:     Mouth: Mucous membranes are moist.     Pharynx: Oropharynx is clear.  Cardiovascular:     Rate and Rhythm: Normal rate and regular rhythm.     Pulses: Normal pulses.     Heart sounds: Normal heart sounds.  Pulmonary:     Effort: Pulmonary effort is normal.     Breath sounds: Normal breath sounds.  Musculoskeletal:     Cervical back: Normal range of motion and neck supple.  Skin:    General: Skin is warm and dry.  Neurological:     Mental Status: She is alert and oriented to person, place, and time.  Psychiatric:        Mood and Affect: Mood normal.        Behavior: Behavior normal.        Thought Content: Thought content normal.        Judgment: Judgment normal.     No results found for any visits on 12/30/22.      Assessment & Plan:   Problem List Items Addressed This Visit   None Visit Diagnoses     Upper respiratory tract infection, unspecified type    -  Primary   Relevant Medications   chlorpheniramine-HYDROcodone (TUSSIONEX) 10-8 MG/5ML   guaiFENesin (MUCINEX) 600 MG 12 hr tablet   amoxicillin-clavulanate (AUGMENTIN) 875-125  MG tablet     Your symptoms could be viral, but given some "double sickening" over the weekend, I will send in an antibiotic for you to take if not feeling better within the next day or so. Adding Mucinex and as needed cough syrup - this could make you drowsy. Continue supportive measures including rest, hydration, humidifier use, steam showers, warm compresses to sinuses, warm liquids with lemon and honey, and over-the-counter cough, cold, and analgesics as needed.   BP is high today - you need to follow-up with your primary care doctor for this in 2-4 weeks. Try monitoring at home occasionally if you are able.   Please contact office for follow-up if symptoms do not improve or worsen. Seek emergency care if symptoms become severe.  Meds ordered this encounter   Medications   chlorpheniramine-HYDROcodone (TUSSIONEX) 10-8 MG/5ML    Sig: Take 5 mLs by mouth every 12 (twelve) hours as needed for up to 5 days.    Dispense:  50 mL    Refill:  0    Order Specific Question:   Supervising Provider    Answer:   Danise Edge A [[id number]]   guaiFENesin (MUCINEX) 600 MG 12 hr tablet    Sig: Take 2 tablets (1,200 mg total) by mouth 2 (two) times daily.    Dispense:  30 tablet    Refill:  0    Order Specific Question:   Supervising Provider    Answer:   Danise Edge A [[id number]]   amoxicillin-clavulanate (AUGMENTIN) 875-125 MG tablet    Sig: Take 1 tablet by mouth 2 (two) times daily.    Dispense:  20 tablet    Refill:  0    Order Specific Question:   Supervising Provider    Answer:   Danise Edge A [[id number]]    Return if symptoms worsen or fail to improve, for PCP in 2-4 weeks for high BP.  Clayborne Dana, NP

## 2023-01-01 ENCOUNTER — Ambulatory Visit: Payer: Medicare Other | Admitting: Dietician

## 2023-02-07 ENCOUNTER — Other Ambulatory Visit: Payer: Self-pay | Admitting: Internal Medicine

## 2023-02-12 ENCOUNTER — Encounter: Payer: Self-pay | Admitting: Internal Medicine

## 2023-02-12 DIAGNOSIS — H5203 Hypermetropia, bilateral: Secondary | ICD-10-CM | POA: Diagnosis not present

## 2023-02-12 DIAGNOSIS — E119 Type 2 diabetes mellitus without complications: Secondary | ICD-10-CM | POA: Diagnosis not present

## 2023-02-12 DIAGNOSIS — H2513 Age-related nuclear cataract, bilateral: Secondary | ICD-10-CM | POA: Diagnosis not present

## 2023-02-12 LAB — HM DIABETES EYE EXAM

## 2023-03-28 DIAGNOSIS — Z23 Encounter for immunization: Secondary | ICD-10-CM | POA: Diagnosis not present

## 2023-05-14 ENCOUNTER — Encounter: Payer: Medicare Other | Attending: Internal Medicine | Admitting: Registered"

## 2023-05-14 ENCOUNTER — Encounter: Payer: Self-pay | Admitting: Registered"

## 2023-05-14 DIAGNOSIS — E119 Type 2 diabetes mellitus without complications: Secondary | ICD-10-CM | POA: Insufficient documentation

## 2023-05-14 NOTE — Patient Instructions (Signed)
-   Aim to balance breakfast by adding protein to oatmeal such as: Eggs Nuts (sometimes) TBS peanut butter  - Aim to have 1/2 plate of non-starchy vegetables with lunch and dinner.   - Increase water intake to 3 bottles a day.

## 2023-05-14 NOTE — Progress Notes (Signed)
Diabetes Self-Management Education  Visit Type: First/Initial  Appt. Start Time: 10:23 Appt. End Time: 11:26  05/14/2023  [name], identified by name and date of birth, is a 71 y.o. female with a diagnosis of Diabetes: Type 2.   ASSESSMENT  States recent A1c was 6.9% (11/2022) and will return to PCP in 2  weeks for another A1c check. States she knows once she reaches 7%, she will be put on medication. States she is afraid of needles. States she has been trying to control diabetes with diet. States she has had 2 surgeries within past 2 years. States prior to surgeries she was on a good regimen with physical activity. States she walked in her development but has not been able to walk or be active lately.   States she thinks she can tell when BS is high due to increased thirst. States she tries to quench her thirst with water and will eat something she doesn't need.   Pt expectations: wants to know what she should and should not be eating   There were no vitals taken for this visit. There is no height or weight on file to calculate BMI.   Diabetes Self-Management Education - 05/14/23 1027       Visit Information   Visit Type First/Initial      Initial Visit   Diabetes Type Type 2    Are you currently following a meal plan? No    Are you taking your medications as prescribed? Not on Medications      Health Coping   How would you rate your overall health? Good      Psychosocial Assessment   Patient Belief/Attitude about Diabetes Afraid    What is the hardest part about your diabetes right now, causing you the most concern, or is the most worrisome to you about your diabetes?   Taking/obtaining medications    Self-care barriers None    Self-management support Doctor's office;Church;Friends;Family    Other persons present Patient    Patient Concerns Nutrition/Meal planning;Medication    Special Needs None    Preferred Learning Style No preference indicated    Learning  Readiness Ready    How often do you need to have someone help you when you read instructions, pamphlets, or other written materials from your doctor or pharmacy? 1 - Never    What is the last grade level you completed in school? some college      Complications   Last HgB A1C per patient/outside source 6.9 %    How often do you check your blood sugar? 0 times/day (not testing)    Number of hypoglycemic episodes per month 0    Number of hyperglycemic episodes ( >200mg /dL): Occasional    Can you tell when your blood sugar is high? Yes    What do you do if your blood sugar is high? drinks water    Have you had a dilated eye exam in the past 12 months? Yes    Have you had a dental exam in the past 12 months? Yes    Are you checking your feet? No      Dietary Intake   Breakfast 10 am - oatmeal with butter, brown sugar, 2% milk + cup of coffee with creamer    Lunch 3:30 pm - [employer]-1/2 6" [place] sub (oil, vinegar, veggies, mayo, cheese) + 2-3 honey mustard pretzels    Dinner 7:30 pm - [employer]-1/2 6" [place] sub (oil, vinegar, veggies, mayo, cheese) +  2-3 honey mustard pretzels    Beverage(s) coffee (8 oz), 2% milk, water (16 oz); 26 oz      Activity / Exercise   Activity / Exercise Type ADL's    How many days per week do you exercise? 0    How many minutes per day do you exercise? 0    Total minutes per week of exercise 0      Patient Education   Previous Diabetes Education No    Disease Pathophysiology Definition of diabetes, type 1 and 2, and the diagnosis of diabetes;Factors that contribute to the development of diabetes    Healthy Eating Plate Method    Being Active Role of exercise on diabetes management, blood pressure control and cardiac health.    Monitoring Identified appropriate SMBG and/or A1C goals.;Taught/discussed recording of test results and interpretation of SMBG.    Acute complications Taught prevention, symptoms, and  treatment of hypoglycemia - the 15  rule.;Discussed and identified patients' prevention, symptoms, and treatment of hyperglycemia.    Chronic complications Lipid levels, blood glucose control and heart disease;Applicable immunizations    Diabetes Stress and Support Identified and addressed patients feelings and concerns about diabetes;Role of stress on diabetes    Lifestyle and Health Coping Lifestyle issues that need to be addressed for better diabetes care      Individualized Goals (developed by patient)   Nutrition Follow meal plan discussed    Physical Activity Exercise 1-2 times per week;15 minutes per day;30 minutes per day    Medications Not Applicable    Problem Solving Eating Pattern;Addressing barriers to behavior change    Reducing Risk treat hypoglycemia with 15 grams of carbs if blood glucose less than 70mg /dL      Post-Education Assessment   Patient understands the diabetes disease and treatment process. Demonstrates understanding / competency    Patient understands incorporating nutritional management into lifestyle. Demonstrates understanding / competency    Patient undertands incorporating physical activity into lifestyle. Demonstrates understanding / competency    Patient understands using medications safely. Needs Review    Patient understands monitoring blood glucose, interpreting and using results Demonstrates understanding / competency    Patient understands prevention, detection, and treatment of acute complications. Demonstrates understanding / competency    Patient understands prevention, detection, and treatment of chronic complications. Needs Review    Patient understands how to develop strategies to address psychosocial issues. Demonstrates understanding / competency    Patient understands how to develop strategies to promote health/change behavior. Demonstrates understanding / competency      Outcomes   Expected Outcomes Demonstrated interest in learning. Expect positive outcomes    Future DMSE 4-6  wks    Program Status Not Completed             Individualized Plan for Diabetes Self-Management Training:   Learning Objective:  Patient will have a greater understanding of diabetes self-management. Patient education plan is to attend individual and/or group sessions per assessed needs and concerns.   Plan:   Patient Instructions  - Aim to balance breakfast by adding protein to oatmeal such as: Eggs Nuts (sometimes) TBS peanut butter  - Aim to have 1/2 plate of non-starchy vegetables with lunch and dinner.   - Increase water intake to 3 bottles a day.    Expected Outcomes:  Demonstrated interest in learning. Expect positive outcomes  Education material provided: ADA - How to Thrive: A Guide for Your Journey with Diabetes  If problems or questions, patient to contact team  via:  Phone and Email  Future DSME appointment: 4-6 wks

## 2023-05-15 DIAGNOSIS — L089 Local infection of the skin and subcutaneous tissue, unspecified: Secondary | ICD-10-CM | POA: Diagnosis not present

## 2023-05-15 DIAGNOSIS — L658 Other specified nonscarring hair loss: Secondary | ICD-10-CM | POA: Diagnosis not present

## 2023-05-15 DIAGNOSIS — L811 Chloasma: Secondary | ICD-10-CM | POA: Diagnosis not present

## 2023-05-15 DIAGNOSIS — L6612 Frontal fibrosing alopecia: Secondary | ICD-10-CM | POA: Diagnosis not present

## 2023-05-15 DIAGNOSIS — L6681 Central centrifugal cicatricial alopecia: Secondary | ICD-10-CM | POA: Diagnosis not present

## 2023-05-15 DIAGNOSIS — L661 Lichen planopilaris, unspecified: Secondary | ICD-10-CM | POA: Diagnosis not present

## 2023-05-17 ENCOUNTER — Other Ambulatory Visit: Payer: Self-pay | Admitting: Internal Medicine

## 2023-05-27 ENCOUNTER — Encounter: Payer: Self-pay | Admitting: Internal Medicine

## 2023-05-27 NOTE — Patient Instructions (Addendum)
      Blood work was ordered.       Medications changes include :   None    A referral was ordered and someone will call you to schedule an appointment.     Return in about 6 months (around 11/26/2023) for follow up.

## 2023-05-27 NOTE — Progress Notes (Unsigned)
Subjective:    Patient ID: Frances Gray, female    DOB: Aug 02, 1950, 72 y.o.   MRN: [id number]     HPI Frances Gray is here for follow up of her chronic medical problems.  Saw nutritionist earlier this month.   Sees her again next month.  Has not been eating well this month.   Right middle finger difficult bending it and knot in palm.  Will see ortho.  No exercise.    Medications and allergies reviewed with patient and updated if appropriate.  Current Outpatient Medications on File Prior to Visit  Medication Sig Dispense Refill   Biotin 10 MG TABS Take 10 mg by mouth daily.     calcium carbonate (CALCIUM 600) 600 MG TABS tablet Take 1 tablet (600 mg total) by mouth 2 (two) times daily with a meal. 60 tablet 6   cetirizine (ZYRTEC) 10 MG tablet Take 10 mg by mouth as needed for allergies.     Cholecalciferol (VITAMIN D3) 2000 UNITS TABS Take 2,000 Units by mouth every morning.      clobetasol (OLUX) 0.05 % topical foam Apply 1 Application topically every other day.     finasteride (PROSCAR) 5 MG tablet Take 5 mg by mouth daily.     fluticasone (FLONASE) 50 MCG/ACT nasal spray Place 1 spray into both nostrils daily as needed for allergies or rhinitis.   0   guaiFENesin (MUCINEX) 600 MG 12 hr tablet Take 2 tablets (1,200 mg total) by mouth 2 (two) times daily. 30 tablet 0   losartan-hydrochlorothiazide (HYZAAR) 100-25 MG tablet TAKE 1 TABLET BY MOUTH EVERY DAY 90 tablet 1   Multiple Vitamin (MULTIVITAMIN) tablet Take 1 tablet by mouth every morning.      Multiple Vitamins-Minerals (ZINC PO) Take 1 tablet by mouth daily.     potassium chloride (KLOR-CON M) 10 MEQ tablet Take 1 tablet (10 mEq total) by mouth 2 (two) times daily. 6 tablet 0   rosuvastatin (CRESTOR) 5 MG tablet TAKE 1 TABLET BY MOUTH TWICE WEEKLY 24 tablet 1   sennosides-docusate sodium (SENOKOT-S) 8.6-50 MG tablet Take 2 tablets by mouth at bedtime. For AFTER surgery, do not take if having diarrhea     tacrolimus  (PROTOPIC) 0.1 % ointment Apply to frontal hair line daily to every other day and use to face daily to twice daily as needed for itching.     No current facility-administered medications on file prior to visit.     Review of Systems  Constitutional:  Negative for fever.  Respiratory:  Negative for cough, shortness of breath and wheezing.   Cardiovascular:  Negative for chest pain, palpitations and leg swelling.  Neurological:  Negative for light-headedness and headaches.       Objective:   Vitals:   05/28/23 0932  BP: 134/72  Pulse: (!) 58  Temp: 98 F (36.7 C)  SpO2: 98%   BP Readings from Last 3 Encounters:  05/28/23 134/72  12/30/22 (!) 151/57  12/27/22 (!) 154/75   Wt Readings from Last 3 Encounters:  05/28/23 278 lb (126.1 kg)  12/30/22 275 lb (124.7 kg)  12/27/22 272 lb (123.4 kg)   Body mass index is 47.72 kg/m.    Physical Exam Constitutional:      General: She is not in acute distress.    Appearance: Normal appearance.  HENT:     Head: Normocephalic and atraumatic.  Eyes:     Conjunctiva/sclera: Conjunctivae normal.  Cardiovascular:     Rate and  Rhythm: Normal rate and regular rhythm.     Heart sounds: Normal heart sounds.  Pulmonary:     Effort: Pulmonary effort is normal. No respiratory distress.     Breath sounds: Normal breath sounds. No wheezing.  Musculoskeletal:     Cervical back: Neck supple.     Right lower leg: No edema.     Left lower leg: No edema.  Lymphadenopathy:     Cervical: No cervical adenopathy.  Skin:    General: Skin is warm and dry.     Findings: No rash.  Neurological:     Mental Status: She is alert. Mental status is at baseline.  Psychiatric:        Mood and Affect: Mood normal.        Behavior: Behavior normal.        Lab Results  Component Value Date   WBC 6.1 11/26/2022   HGB 13.0 11/26/2022   HCT 40.4 11/26/2022   PLT 352.0 11/26/2022   GLUCOSE 107 (H) 11/26/2022   CHOL 197 11/26/2022   TRIG 111.0  11/26/2022   HDL 57.80 11/26/2022   LDLCALC 117 (H) 11/26/2022   ALT 22 11/26/2022   AST 22 11/26/2022   NA 136 11/26/2022   K 3.6 11/26/2022   CL 98 11/26/2022   CREATININE 0.63 11/26/2022   BUN 12 11/26/2022   CO2 30 11/26/2022   TSH 2.29 05/29/2022   INR 1.2 06/12/2021   HGBA1C 6.9 (H) 11/26/2022   MICROALBUR <0.7 11/26/2022     Assessment & Plan:    See Problem List for Assessment and Plan of chronic medical problems.

## 2023-05-28 ENCOUNTER — Encounter: Payer: Self-pay | Admitting: Internal Medicine

## 2023-05-28 ENCOUNTER — Ambulatory Visit (INDEPENDENT_AMBULATORY_CARE_PROVIDER_SITE_OTHER): Payer: Medicare Other | Admitting: Internal Medicine

## 2023-05-28 VITALS — BP 134/72 | HR 58 | Temp 98.0°F | Ht 64.0 in | Wt 278.0 lb

## 2023-05-28 DIAGNOSIS — E7849 Other hyperlipidemia: Secondary | ICD-10-CM | POA: Diagnosis not present

## 2023-05-28 DIAGNOSIS — E559 Vitamin D deficiency, unspecified: Secondary | ICD-10-CM

## 2023-05-28 DIAGNOSIS — E119 Type 2 diabetes mellitus without complications: Secondary | ICD-10-CM | POA: Diagnosis not present

## 2023-05-28 DIAGNOSIS — I10 Essential (primary) hypertension: Secondary | ICD-10-CM | POA: Diagnosis not present

## 2023-05-28 DIAGNOSIS — M79641 Pain in right hand: Secondary | ICD-10-CM | POA: Diagnosis not present

## 2023-05-28 LAB — LIPID PANEL
Cholesterol: 197 mg/dL (ref 0–200)
HDL: 58.5 mg/dL (ref >39.00)
LDL Cholesterol: 111 mg/dL — ABNORMAL HIGH (ref 0–99)
NonHDL: 138.51
Total CHOL/HDL Ratio: 3
Triglycerides: 137 mg/dL (ref 0.0–149.0)
VLDL: 27.4 mg/dL (ref 0.0–40.0)

## 2023-05-28 LAB — COMPREHENSIVE METABOLIC PANEL
ALT: 19 U/L (ref 0–35)
AST: 21 U/L (ref 0–37)
Albumin: 3.8 g/dL (ref 3.5–5.2)
Alkaline Phosphatase: 74 U/L (ref 39–117)
BUN: 11 mg/dL (ref 6–23)
CO2: 32 meq/L (ref 19–32)
Calcium: 9.8 mg/dL (ref 8.4–10.5)
Chloride: 100 meq/L (ref 96–112)
Creatinine, Ser: 0.62 mg/dL (ref 0.40–1.20)
GFR: 88.3 mL/min (ref >60.00)
Glucose, Bld: 146 mg/dL — ABNORMAL HIGH (ref 70–99)
Potassium: 3.9 meq/L (ref 3.5–5.1)
Sodium: 137 meq/L (ref 135–145)
Total Bilirubin: 0.4 mg/dL (ref 0.2–1.2)
Total Protein: 7.7 g/dL (ref 6.0–8.3)

## 2023-05-28 LAB — HEMOGLOBIN A1C: Hgb A1c MFr Bld: 8.1 % — ABNORMAL HIGH (ref 4.6–6.5)

## 2023-05-28 NOTE — Assessment & Plan Note (Signed)
Chronic Blood pressure well controlled CMP, CBC Continue losartan-HCTZ 100-25 mg daily

## 2023-05-28 NOTE — Assessment & Plan Note (Signed)
Chronic Taking vitamin D daily Continue vitamin D supplementation

## 2023-05-28 NOTE — Assessment & Plan Note (Signed)
Chronic Stressed weight loss Discussed diabetic medications that may help with weight loss Referred to nutrition 6 months ago Stressed regular exercise Stressed healthy diet low in sugars/carbs and small portions

## 2023-05-28 NOTE — Assessment & Plan Note (Addendum)
Chronic  Lab Results  Component Value Date   HGBA1C 6.9 (H) 11/26/2022   Saw nutritionist this month and again next month Sugars controlled Check A1c Stressed regular exercise, diabetic diet Currently lifestyle controlled Depending on A1c result may need medication

## 2023-05-28 NOTE — Assessment & Plan Note (Signed)
Chronic Regular exercise and healthy diet encouraged Check lipid panel, CMP Continue Crestor 5 mg twice weekly 

## 2023-06-18 ENCOUNTER — Encounter: Payer: Self-pay | Admitting: Dietician

## 2023-06-18 ENCOUNTER — Encounter: Payer: Medicare Other | Attending: Internal Medicine | Admitting: Dietician

## 2023-06-18 VITALS — Wt 277.6 lb

## 2023-06-18 DIAGNOSIS — E119 Type 2 diabetes mellitus without complications: Secondary | ICD-10-CM | POA: Diagnosis not present

## 2023-06-18 NOTE — Progress Notes (Signed)
 Diabetes Self-Management Education  Visit Type: Follow-up  Appt. Start Time: 1100 Appt. End Time: 1130  06/18/2023  [name], identified by name and date of birth, is a 73 y.o. female with a diagnosis of Diabetes: Type 2.   ASSESSMENT  History includes: type 2 diabetes, allergies, anemia, arthritis, GERD, heart murmur, HLD, HTN Labs noted: 05/28/23: A1c 8.1 Medications include: reviewed Supplements: MVI, biotin , vitamin D , calcium   A1c increased from 6.9% on 11/26/22 to 8.1% on 05/28/23.  Pt states she has been trying to watch how much she eats and what she has been eating. Pt reports she has been trying to drink more water , aiming for her goal of 3 bottles daily but usually drinking 2 bottles. Pt states she also drinks 1 cup of minute maid juice daily and plans to switch this to a water  bottle with a crystal light packet.   Pt reports for breakfast she continues to have grits and eggs or oatmeal with brown sugar. Continued discussion on reducing brown sugar and implementing a protein with the oatmeal.   Pt states her friends walk daily but it is difficult for her in the winter so she is considering trying a chair workout on youtube.   Assessment of Previous Goals:   - Aim to balance breakfast by adding protein to oatmeal such as: - goal not met, continue Eggs Nuts (sometimes) TBS peanut butter   - Aim to have 1/2 plate of non-starchy vegetables with lunch and dinner. - goal in progress, continue.    - Increase water  intake to 3 bottles a day. - goal not met, continue.   New Goals:  Exercise for 20-30 minutes at least 3-5 days per week.   Swap juice for a crystal light packet.   At meals, aim to include 1/4 plate protein, 1/4 plate complex carbs, and 1/2 plate non-starchy vegetables.   Weight 277 lb 9.6 oz (125.9 kg). Body mass index is 47.65 kg/m.   Diabetes Self-Management Education - 06/18/23 1101       Visit Information   Visit Type Follow-up       Initial Visit   Diabetes Type Type 2    Are you currently following a meal plan? No      Psychosocial Assessment   Patient Belief/Attitude about Diabetes Motivated to manage diabetes    What is the hardest part about your diabetes right now, causing you the most concern, or is the most worrisome to you about your diabetes?   Making healty food and beverage choices    Self-care barriers None    Self-management support Doctor's office    Other persons present Patient    Patient Concerns Nutrition/Meal planning    Special Needs None    Preferred Learning Style No preference indicated    Learning Readiness Ready      Pre-Education Assessment   Patient understands the diabetes disease and treatment process. Needs Review    Patient understands incorporating nutritional management into lifestyle. Needs Review    Patient undertands incorporating physical activity into lifestyle. Needs Review    Patient understands using medications safely. Needs Review    Patient understands monitoring blood glucose, interpreting and using results Needs Review    Patient understands prevention, detection, and treatment of acute complications. Needs Review    Patient understands prevention, detection, and treatment of chronic complications. Needs Review    Patient understands how to develop strategies to address psychosocial issues. Needs Review    Patient understands how to develop  strategies to promote health/change behavior. Needs Review      Complications   Last HgB A1C per patient/outside source 8.1 %      Dietary Intake   Breakfast sausage, eggs and grits OR oatmeal with 1T brown sugar, butter and milk    Snack (morning) none    Lunch potato chips    Snack (afternoon) none    Dinner spare rib and collard greens and cornbread    Snack (evening) fruit    Beverage(s) 2 bottles water , 1 glass juice      Activity / Exercise   Activity / Exercise Type ADL's      Patient Education   Previous Diabetes  Education Yes (please comment)    Disease Pathophysiology Factors that contribute to the development of diabetes;Explored patient's options for treatment of their diabetes    Healthy Eating Role of diet in the treatment of diabetes and the relationship between the three main macronutrients and blood glucose level;Plate Method;Meal timing in regards to the patients' current diabetes medication.;Information on hints to eating out and maintain blood glucose control.;Meal options for control of blood glucose level and chronic complications.    Being Active Identified with patient nutritional and/or medication changes necessary with exercise.;Helped patient identify appropriate exercises in relation to his/her diabetes, diabetes complications and other health issue.    Monitoring Identified appropriate SMBG and/or A1C goals.    Chronic complications Identified and discussed with patient  current chronic complications    Diabetes Stress and Support Identified and addressed patients feelings and concerns about diabetes    Lifestyle and Health Coping Lifestyle issues that need to be addressed for better diabetes care      Individualized Goals (developed by patient)   Nutrition General guidelines for healthy choices and portions discussed    Physical Activity Exercise 3-5 times per week;30 minutes per day;15 minutes per day    Medications take my medication as prescribed    Monitoring  Test my blood glucose as discussed    Problem Solving Eating Pattern    Reducing Risk examine blood glucose patterns;do foot checks daily;treat hypoglycemia with 15 grams of carbs if blood glucose less than 70mg /dL    Health Coping Ask for help with psychological, social, or emotional issues      Patient Self-Evaluation of Goals - Patient rates self as meeting previously set goals (% of time)   Nutrition 50 - 75 % (half of the time)    Physical Activity < 25% (hardly ever/never)    Medications Not Applicable    Monitoring  < 25% (hardly ever/never)    Problem Solving and behavior change strategies  50 - 75 % (half of the time)    Reducing Risk (treating acute and chronic complications) 50 - 75 % (half of the time)    Health Coping 50 - 75 % (half of the time)      Post-Education Assessment   Patient understands the diabetes disease and treatment process. Comprehends key points    Patient understands incorporating nutritional management into lifestyle. Comprehends key points    Patient undertands incorporating physical activity into lifestyle. Comprehends key points    Patient understands using medications safely. N/A    Patient understands monitoring blood glucose, interpreting and using results Comprehends key points    Patient understands prevention, detection, and treatment of acute complications. Comprehends key points    Patient understands prevention, detection, and treatment of chronic complications. Comprehends key points    Patient understands how to develop  strategies to address psychosocial issues. Comprehends key points    Patient understands how to develop strategies to promote health/change behavior. Comprehends key points      Outcomes   Expected Outcomes Demonstrated interest in learning. Expect positive outcomes    Future DMSE PRN    Program Status Completed             Individualized Plan for Diabetes Self-Management Training:   Learning Objective:  Patient will have a greater understanding of diabetes self-management. Patient education plan is to attend individual and/or group sessions per assessed needs and concerns.   Plan:   Patient Instructions  New Goals:  Exercise for 20-30 minutes at least 3-5 days per week.   Swap juice for a crystal light packet.   At meals, aim to include 1/4 plate protein, 1/4 plate complex carbs, and 1/2 plate non-starchy vegetables.   Expected Outcomes:  Demonstrated interest in learning. Expect positive outcomes  Education material provided:  My Plate, Non-starchy vegetable list  If problems or questions, patient to contact team via:  Phone  Future DSME appointment: PRN

## 2023-06-18 NOTE — Patient Instructions (Signed)
 New Goals:  Exercise for 20-30 minutes at least 3-5 days per week.   Swap juice for a crystal light packet.   At meals, aim to include 1/4 plate protein, 1/4 plate complex carbs, and 1/2 plate non-starchy vegetables.

## 2023-06-25 DIAGNOSIS — M79641 Pain in right hand: Secondary | ICD-10-CM | POA: Diagnosis not present

## 2023-07-24 ENCOUNTER — Other Ambulatory Visit: Payer: Self-pay | Admitting: Internal Medicine

## 2023-08-14 ENCOUNTER — Telehealth: Payer: Self-pay

## 2023-08-14 NOTE — Telephone Encounter (Signed)
 Patient was identified as falling into the [place] Measure - Diabetes.   Patient was: Appointment scheduled for lab or office visit for A1c. Patient is on a every 6 month schedule.

## 2023-08-26 ENCOUNTER — Ambulatory Visit (INDEPENDENT_AMBULATORY_CARE_PROVIDER_SITE_OTHER): Payer: Medicare Other

## 2023-08-26 VITALS — BP 138/80 | HR 60 | Ht 64.0 in | Wt 274.2 lb

## 2023-08-26 DIAGNOSIS — Z0100 Encounter for examination of eyes and vision without abnormal findings: Secondary | ICD-10-CM

## 2023-08-26 DIAGNOSIS — K635 Polyp of colon: Secondary | ICD-10-CM | POA: Diagnosis not present

## 2023-08-26 DIAGNOSIS — Z0000 Encounter for general adult medical examination without abnormal findings: Secondary | ICD-10-CM

## 2023-08-26 DIAGNOSIS — E119 Type 2 diabetes mellitus without complications: Secondary | ICD-10-CM | POA: Diagnosis not present

## 2023-08-26 NOTE — Patient Instructions (Signed)
 Frances Gray , Thank you for taking time to come for your Medicare Wellness Visit. I appreciate your ongoing commitment to your health goals. Please review the following plan we discussed and let me know if I can assist you in the future.   Referrals/Orders/Follow-Ups/Clinician Recommendations: Aim for 30 minutes of exercise or brisk walking, 6-8 glasses of water, and 5 servings of fruits and vegetables each day. Referral to GI for a repeat Colonoscopy and to Dr Randon Goldsmith (Ophthalmologist) for a Diabetic eye exam.  This is a list of the screening recommended for you and due dates:  Health Maintenance  Topic Date Due   COVID-19 Vaccine (5 - 2024-25 season) 02/09/2023   Colon Cancer Screening  11/24/2023   DTaP/Tdap/Td vaccine (1 - Tdap) 05/27/2024*   Yearly kidney health urinalysis for diabetes  11/26/2023   Complete foot exam   11/26/2023   Hemoglobin A1C  11/26/2023   Eye exam for diabetics  02/12/2024   Yearly kidney function blood test for diabetes  05/27/2024   Medicare Annual Wellness Visit  08/25/2024   Mammogram  12/04/2024   DEXA scan (bone density measurement)  06/21/2027   Pneumonia Vaccine  Completed   Flu Shot  Completed   Hepatitis C Screening  Completed   Zoster (Shingles) Vaccine  Completed   HPV Vaccine  Aged Out  *Topic was postponed. The date shown is not the original due date.    Advanced directives: (Provided) Advance directive discussed with you today. I have provided a copy for you to complete at home and have notarized. Once this is complete, please bring a copy in to our office so we can scan it into your chart.   Next Medicare Annual Wellness Visit scheduled for next year: Yes

## 2023-08-26 NOTE — Progress Notes (Signed)
 Subjective:   Frances Gray is a 73 y.o. who presents for a Medicare Wellness preventive visit.  Visit Complete: In person  Persons Participating in Visit: Patient.  AWV Questionnaire: No: Patient Medicare AWV questionnaire was not completed prior to this visit.  Cardiac Risk Factors include: advanced age (>32men, >50 women);diabetes mellitus;dyslipidemia;hypertension;obesity (BMI >30kg/m2)     Objective:    Today's Vitals   08/26/23 0930  BP: 138/80  Pulse: 60  SpO2: 97%  Weight: 274 lb 3.2 oz (124.4 kg)  Height: 5\' 4"  (1.626 m)   Body mass index is 47.07 kg/m.     08/26/2023    9:29 AM 05/14/2023   10:25 AM 12/27/2022    7:34 PM 08/21/2022   10:34 AM 07/25/2022   11:00 AM 07/25/2022    6:16 AM 07/23/2022    9:42 AM  Advanced Directives  Does Patient Have a Medical Advance Directive? No No No No No  No  Would patient like information on creating a medical advance directive? Yes (MAU/Ambulatory/Procedural Areas - Information given) No - Patient declined No - Patient declined No - Patient declined No - Patient declined No - Patient declined Yes (MAU/Ambulatory/Procedural Areas - Information given)    Current Medications (verified) Outpatient Encounter Medications as of 08/26/2023  Medication Sig   Biotin 10 MG TABS Take 10 mg by mouth daily.   calcium carbonate (CALCIUM 600) 600 MG TABS tablet Take 1 tablet (600 mg total) by mouth 2 (two) times daily with a meal.   cetirizine (ZYRTEC) 10 MG tablet Take 10 mg by mouth as needed for allergies.   Cholecalciferol (VITAMIN D3) 2000 UNITS TABS Take 2,000 Units by mouth every morning.    clobetasol (OLUX) 0.05 % topical foam Apply 1 Application topically every other day.   finasteride (PROSCAR) 5 MG tablet Take 5 mg by mouth daily.   fluticasone (FLONASE) 50 MCG/ACT nasal spray Place 1 spray into both nostrils daily as needed for allergies or rhinitis.    guaiFENesin (MUCINEX) 600 MG 12 hr tablet Take 2 tablets (1,200 mg total)  by mouth 2 (two) times daily.   losartan-hydrochlorothiazide (HYZAAR) 100-25 MG tablet TAKE 1 TABLET BY MOUTH EVERY DAY   Multiple Vitamin (MULTIVITAMIN) tablet Take 1 tablet by mouth every morning.    Multiple Vitamins-Minerals (ZINC PO) Take 1 tablet by mouth daily.   potassium chloride (KLOR-CON M) 10 MEQ tablet Take 1 tablet (10 mEq total) by mouth 2 (two) times daily.   rosuvastatin (CRESTOR) 5 MG tablet TAKE 1 TABLET BY MOUTH TWICE WEEKLY   sennosides-docusate sodium (SENOKOT-S) 8.6-50 MG tablet Take 2 tablets by mouth at bedtime. For AFTER surgery, do not take if having diarrhea   tacrolimus (PROTOPIC) 0.1 % ointment Apply to frontal hair line daily to every other day and use to face daily to twice daily as needed for itching.   No facility-administered encounter medications on file as of 08/26/2023.    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, R knee s/p TKR 6/16   Diverticulitis    GERD (gastroesophageal reflux disease)    doesn't take any meds   Heart murmur    no symptoms   History of bronchitis winter of 2014   History  of colon polyps    benign   History of kidney stones    Hyperlipidemia    Hypertension    takes Hyzaar daily   Perforation of sigmoid colon due to diverticulitis 09/24/2019   Pneumonia    Pre-diabetes    Vitamin D deficiency disease 08/14/2011   takes Vit D daily   Past Surgical History:  Procedure Laterality Date   BREAST BIOPSY Left    Breast ultrasound Right 03/02/2013   The 4mm complex cyst in the right breast is probably benign. Follow-up in 6 months with bilateral mammogram and (R) ultrasound   Breast Ultrasound Right 09/02/2013   There is a benign 3 mm complex cyst in the (R) breast at 9 o'clock   COLONOSCOPY      CYSTOSCOPY  07/25/2022   Procedure: CYSTOSCOPY;  Surgeon: Carver Fila, MD;  Location: [facility];  Service: Gynecology;;   ESOPHAGUS SURGERY  1996   tumor removed   (benign)   KNEE ARTHROSCOPY     bilateral   LAPAROSCOPIC LOW ANTERIOR RESECTION     robotic, with Dr. Michaell Cowing in 2023   LITHOTRIPSY  08/2011   R ureteral stone   POLYPECTOMY     PROCTOSCOPY N/A 08/24/2021   Procedure: RIGID PROCTOSCOPY;  Surgeon: Karie Soda, MD;  Location: [facility];  Service: General;  Laterality: N/A;   ROBOTIC ASSISTED SALPINGO OOPHERECTOMY Right 07/25/2022   Procedure: XI ROBOTIC ASSISTED UNILATERAL SALPINGO OOPHORECTOMY;  Surgeon: Carver Fila, MD;  Location: [facility];  Service: Gynecology;  Laterality: Right;   ROBOTIC ASSISTED TOTAL HYSTERECTOMY N/A 07/25/2022   Procedure: XI ROBOTIC ASSISTED TOTAL HYSTERECTOMY;LYSIS OF ADHESIONS;  Surgeon: Carver Fila, MD;  Location: [facility];  Service: Gynecology;  Laterality: N/A;   TOTAL KNEE ARTHROPLASTY Left 11/16/2012   Procedure: TOTAL KNEE ARTHROPLASTY- left;  Surgeon: Eulas Post, MD;  Location: [facility];  Service: Orthopedics;  Laterality: Left;   TOTAL KNEE ARTHROPLASTY Right 12/06/2014   Procedure: TOTAL KNEE ARTHROPLASTY;  Surgeon: Teryl Lucy, MD;  Location: [facility];  Service: Orthopedics;  Laterality: Right;   TUBAL LIGATION     Family History  Problem Relation Age of Onset   Ovarian cancer Mother 24   Colon cancer Sister 81   Breast cancer Sister 80   Prostate cancer Cousin        mets   Esophageal cancer Neg Hx    Colon polyps Neg Hx    Rectal cancer Neg Hx    Stomach cancer Neg Hx    Endometrial cancer Neg Hx    Pancreatic cancer Neg Hx    Social History   Socioeconomic History   Marital status: Married    Spouse name: Not on file   Number of children: Not on file   Years of education: Not on file   Highest education level: Not on file  Occupational History   Occupation: retired  Tobacco Use   Smoking status: Former     Current packs/day: 0.00    Average packs/day: 0.3 packs/day for 10.0 years (2.5 ttl pk-yrs)    Types: Cigarettes    Start date: 46    Quit date: 1980    Years since quitting: 45.2    Passive exposure: Past   Smokeless tobacco: Never   Tobacco comments:    quit smoking 63yrs ago  Vaping Use   Vaping status: Never Used  Substance and Sexual Activity   Alcohol use: No   Drug use: No   Sexual activity: Not  Currently    Birth control/protection: Post-menopausal  Other Topics Concern   Not on file  Social History Narrative   Lives with spouse and g-son, temporarily mother in law (05/2014)   Social Drivers of Health   Financial Resource Strain: Low Risk  (08/26/2023)   Overall Financial Resource Strain (CARDIA)    Difficulty of Paying Living Expenses: Not hard at all  Food Insecurity: No Food Insecurity (08/26/2023)   Hunger Vital Sign    Worried About Running Out of Food in the Last Year: Never true    Ran Out of Food in the Last Year: Never true  Transportation Needs: No Transportation Needs (08/26/2023)   PRAPARE - Administrator, Civil Service (Medical): No    Lack of Transportation (Non-Medical): No  Physical Activity: Insufficiently Active (08/26/2023)   Exercise Vital Sign    Days of Exercise per Week: 3 days    Minutes of Exercise per Session: 20 min  Stress: No Stress Concern Present (08/26/2023)   [employer] of Occupational Health - Occupational Stress Questionnaire    Feeling of Stress : Not at all  Social Connections: Socially Integrated (08/26/2023)   Social Connection and Isolation Panel [NHANES]    Frequency of Communication with Friends and Family: More than three times a week    Frequency of Social Gatherings with Friends and Family: More than three times a week    Attends Religious Services: More than 4 times per year    Active Member of [employer] or Organizations: Yes    Attends Engineer, structural: More than 4 times per year    Marital  Status: Married    Tobacco Counseling Counseling given: Not Answered Tobacco comments: quit smoking 82yrs ago    Clinical Intake:  Pre-visit preparation completed: Yes  Pain : No/denies pain     BMI - recorded: 47.07 Nutritional Status: BMI > 30  Obese Nutritional Risks: None Diabetes: No  How often do you need to have someone help you when you read instructions, pamphlets, or other written materials from your doctor or pharmacy?: 1 - Never  Interpreter Needed?: No  Comments: Hassell Halim, CMA   Activities of Daily Living     08/26/2023    9:33 AM  In your present state of health, do you have any difficulty performing the following activities:  Hearing? 0  Vision? 0  Difficulty concentrating or making decisions? 0  Walking or climbing stairs? 0  Dressing or bathing? 0  Doing errands, shopping? 0  Preparing Food and eating ? N  Using the Toilet? N  In the past six months, have you accidently leaked urine? N  Do you have problems with loss of bowel control? N  Managing your Medications? N  Managing your Finances? N  Housekeeping or managing your Housekeeping? N    Patient Care Team: Pincus Sanes, MD as PCP - General (Internal Medicine) [name], MD (Urology) Teryl Lucy, MD (Orthopedic Surgery) Darnell Level, MD (General Surgery) Antony Contras, MD (Ophthalmology) Genia Del, MD (Obstetrics and Gynecology) Louis Meckel, MD (Inactive) (Gastroenterology) Kathyrn Sheriff, [place] (Inactive) as Pharmacist (Pharmacist) Karie Soda, MD as Consulting Physician (Colon and Rectal Surgery)  Indicate any recent Medical Services you may have received from other than [facility] providers in the past year (date may be approximate).     Assessment:   This is a routine wellness examination for Frances Gray.  Hearing/Vision screen Hearing Screening - Comments:: Denies hearing difficulties   Vision Screening -  Comments:: Wears rx glasses - up to date  with routine eye exams with Dr Randon Goldsmith   Goals Addressed               This Visit's Progress     Weight (lb) < 200 lb (90.7 kg) (pt-stated)   274 lb 3.2 oz (124.4 kg)     Patient stated she plans to lose weight (up to 100lbs).         Depression Screen     08/26/2023    9:39 AM 05/14/2023   10:25 AM 11/26/2022    9:53 AM 08/21/2022   10:21 AM 07/11/2022   10:40 AM 05/29/2022    9:53 AM 11/27/2021    9:50 AM  PHQ 2/9 Scores  PHQ - 2 Score 0 0 0 0 1 0 0  PHQ- 9 Score 0   0  0 0    Fall Risk     08/26/2023    9:34 AM 05/14/2023   10:25 AM 11/26/2022    9:53 AM 08/21/2022   10:35 AM 05/29/2022    9:53 AM  Fall Risk   Falls in the past year? 0 0 0 0 0  Number falls in past yr: 0 0 0 0 0  Injury with Fall? 0  0 0 0  Risk for fall due to : No Fall Risks  No Fall Risks No Fall Risks No Fall Risks  Follow up Falls prevention discussed;Falls evaluation completed  Falls evaluation completed Falls prevention discussed Falls evaluation completed    MEDICARE RISK AT HOME:  Medicare Risk at Home Any stairs in or around the home?: No If so, are there any without handrails?: No Home free of loose throw rugs in walkways, pet beds, electrical cords, etc?: Yes Adequate lighting in your home to reduce risk of falls?: Yes Life alert?: No Use of a cane, walker or w/c?: No Grab bars in the bathroom?: Yes Shower chair or bench in shower?: Yes Elevated toilet seat or a handicapped toilet?: Yes  TIMED UP AND GO:  Was the test performed?  No  Cognitive Function: 6CIT completed        08/26/2023    9:35 AM 08/21/2022   10:22 AM  6CIT Screen  What Year? 0 points 0 points  What month? 0 points 0 points  What time? 0 points 0 points  Count back from 20 0 points 0 points  Months in reverse 0 points 0 points  Repeat phrase 4 points 0 points  Total Score 4 points 0 points    Immunizations Immunization History  Administered Date(s) Administered   Fluad Quad(high Dose 65+) 05/08/2021,  03/04/2022   Influenza, High Dose Seasonal PF 05/15/2015, 05/20/2016, 02/27/2017, 05/26/2018, 03/17/2019, 03/29/2020   Influenza, Seasonal, Injecte, Preservative Fre 08/12/2012   Influenza,inj,Quad PF,6+ Mos 02/11/2013, 05/12/2014   Influenza-Unspecified 04/01/2023   Moderna Covid-19 Vaccine Bivalent Booster 97yrs & up 05/13/2022   Moderna SARS-COV2 Booster Vaccination 10/12/2020, 05/08/2021   Moderna Sars-Covid-2 Vaccination 07/25/2019, 08/22/2019, 03/29/2020   PNEUMOCOCCAL CONJUGATE-20 03/28/2023   Pneumococcal Conjugate-13 05/15/2015   Pneumococcal Polysaccharide-23 06/10/2010, 11/19/2016   Zoster Recombinant(Shingrix) 12/03/2018, 02/10/2019   Zoster, Live 10/09/2010    Screening Tests Health Maintenance  Topic Date Due   COVID-19 Vaccine (5 - 2024-25 season) 02/09/2023   Colonoscopy  11/24/2023   DTaP/Tdap/Td (1 - Tdap) 05/27/2024 (Originally 11/27/1969)   Diabetic kidney evaluation - Urine ACR  11/26/2023   FOOT EXAM  11/26/2023   HEMOGLOBIN A1C  11/26/2023   OPHTHALMOLOGY  EXAM  02/12/2024   Diabetic kidney evaluation - eGFR measurement  05/27/2024   Medicare Annual Wellness (AWV)  08/25/2024   MAMMOGRAM  12/04/2024   DEXA SCAN  06/21/2027   Pneumonia Vaccine 74+ Years old  Completed   INFLUENZA VACCINE  Completed   Hepatitis C Screening  Completed   Zoster Vaccines- Shingrix  Completed   HPV VACCINES  Aged Out    Health Maintenance  Health Maintenance Due  Topic Date Due   COVID-19 Vaccine (5 - 2024-25 season) 02/09/2023   Colonoscopy  11/24/2023   Health Maintenance Items Addressed: 08/26/2023 Referral sent to GI for colonoscopy, Referral sent to Optometry/Ophthalmology  Additional Screening:  Vision Screening: Recommended annual ophthalmology exams for early detection of glaucoma and other disorders of the eye.  Dental Screening: Recommended annual dental exams for proper oral hygiene  Community Resource Referral / Chronic Care Management: CRR required this  visit?  No   CCM required this visit?  No     Plan:     I have personally reviewed and noted the following in the patient's chart:   Medical and social history Use of alcohol, tobacco or illicit drugs  Current medications and supplements including opioid prescriptions. Patient is not currently taking opioid prescriptions. Functional ability and status Nutritional status Physical activity Advanced directives List of other physicians Hospitalizations, surgeries, and ER visits in previous 12 months Vitals Screenings to include cognitive, depression, and falls Referrals and appointments  In addition, I have reviewed and discussed with patient certain preventive protocols, quality metrics, and best practice recommendations. A written personalized care plan for preventive services as well as general preventive health recommendations were provided to patient.     Darreld Mclean, CMA   08/26/2023   After Visit Summary: (MyChart) Due to this being a telephonic visit, the after visit summary with patients personalized plan was offered to patient via MyChart   Notes: Please refer to Routing Comments.

## 2023-08-31 ENCOUNTER — Encounter (HOSPITAL_BASED_OUTPATIENT_CLINIC_OR_DEPARTMENT_OTHER): Payer: Self-pay

## 2023-08-31 ENCOUNTER — Emergency Department (HOSPITAL_BASED_OUTPATIENT_CLINIC_OR_DEPARTMENT_OTHER)
Admission: EM | Admit: 2023-08-31 | Discharge: 2023-08-31 | Disposition: A | Discharge status: Home / Self Care | Attending: Emergency Medicine | Admitting: Emergency Medicine

## 2023-08-31 ENCOUNTER — Other Ambulatory Visit: Payer: Self-pay

## 2023-08-31 DIAGNOSIS — J302 Other seasonal allergic rhinitis: Secondary | ICD-10-CM | POA: Diagnosis not present

## 2023-08-31 DIAGNOSIS — R051 Acute cough: Secondary | ICD-10-CM

## 2023-08-31 DIAGNOSIS — R059 Cough, unspecified: Secondary | ICD-10-CM | POA: Diagnosis not present

## 2023-08-31 LAB — GROUP A STREP BY PCR: Group A Strep by PCR: NOT DETECTED

## 2023-08-31 LAB — RESP PANEL BY RT-PCR (RSV, FLU A&B, COVID)  RVPGX2
Influenza A by PCR: NEGATIVE
Influenza B by PCR: NEGATIVE
Resp Syncytial Virus by PCR: NEGATIVE
SARS Coronavirus 2 by RT PCR: NEGATIVE

## 2023-08-31 MED ORDER — TRIAMCINOLONE ACETONIDE 55 MCG/ACT NA AERO: 2.0000 | INHALATION_SPRAY | Freq: Every day | NASAL | 0 refills | Status: AC

## 2023-08-31 NOTE — ED Provider Notes (Signed)
  EMERGENCY DEPARTMENT AT [facility] Provider Note   CSN: [id number] Arrival date & time: 08/31/23  2202     History  Chief Complaint  Patient presents with   Nasal Congestion    Frances Gray is a 73 y.o. female.  The history is provided by the patient.  Illness Quality:  Nose rhinorrhea, sneezing watering, cough Severity:  Moderate Onset quality:  Gradual Duration:  4 days Progression:  Waxing and waning Chronicity:  New Associated symptoms: cough and rhinorrhea   Associated symptoms: no abdominal pain, no fever, no vomiting and no wheezing        Home Medications Prior to Admission medications   Medication Sig Start Date End Date Taking? Authorizing Provider  triamcinolone (NASACORT) 55 MCG/ACT AERO nasal inhaler Place 2 sprays into the nose daily. 08/31/23  Yes Carlise Stofer, MD  Biotin 10 MG TABS Take 10 mg by mouth daily.    [provider]  calcium carbonate (CALCIUM 600) 600 MG TABS tablet Take 1 tablet (600 mg total) by mouth 2 (two) times daily with a meal. 05/12/14   Newt Lukes, MD  cetirizine (ZYRTEC) 10 MG tablet Take 10 mg by mouth as needed for allergies.    [provider]  Cholecalciferol (VITAMIN D3) 2000 UNITS TABS Take 2,000 Units by mouth every morning.     [provider]  clobetasol (OLUX) 0.05 % topical foam Apply 1 Application topically every other day. 10/26/21   [provider]  finasteride (PROSCAR) 5 MG tablet Take 5 mg by mouth daily. 10/18/19   [provider]  fluticasone (FLONASE) 50 MCG/ACT nasal spray Place 1 spray into both nostrils daily as needed for allergies or rhinitis.  10/05/14   [provider]  guaiFENesin (MUCINEX) 600 MG 12 hr tablet Take 2 tablets (1,200 mg total) by mouth 2 (two) times daily. 12/30/22   Clayborne Dana, NP  losartan-hydrochlorothiazide (HYZAAR) 100-25 MG tablet TAKE 1 TABLET BY MOUTH EVERY DAY 05/19/23   Pincus Sanes, MD   Multiple Vitamin (MULTIVITAMIN) tablet Take 1 tablet by mouth every morning.     [provider]  Multiple Vitamins-Minerals (ZINC PO) Take 1 tablet by mouth daily.    [provider]  potassium chloride (KLOR-CON M) 10 MEQ tablet Take 1 tablet (10 mEq total) by mouth 2 (two) times daily. 07/26/22   Cross, Efraim Kaufmann D, NP  rosuvastatin (CRESTOR) 5 MG tablet TAKE 1 TABLET BY MOUTH TWICE WEEKLY 07/24/23   Pincus Sanes, MD  sennosides-docusate sodium (SENOKOT-S) 8.6-50 MG tablet Take 2 tablets by mouth at bedtime. For AFTER surgery, do not take if having diarrhea 07/26/22   Warner Mccreedy D, NP  tacrolimus (PROTOPIC) 0.1 % ointment Apply to frontal hair line daily to every other day and use to face daily to twice daily as needed for itching. 05/15/23   [provider]      Allergies    Amlodipine, Metformin and related, and Pravastatin    Review of Systems   Review of Systems  Constitutional:  Negative for fever.  HENT:  Positive for rhinorrhea.   Respiratory:  Positive for cough. Negative for wheezing.   Gastrointestinal:  Negative for abdominal pain and vomiting.  All other systems reviewed and are negative.   Physical Exam Updated Vital Signs BP (!) 157/80 (BP Location: Right Arm)   Pulse 64   Temp 98 F (36.7 C) (Oral)   Resp 20   Ht 5\' 4"  (1.626 m)  Wt 126.6 kg   SpO2 100%   BMI 47.89 kg/m  Physical Exam Vitals and nursing note reviewed.  Constitutional:      General: She is not in acute distress.    Appearance: She is well-developed.  HENT:     Head: Normocephalic and atraumatic.     Nose: Rhinorrhea present.  Eyes:     Pupils: Pupils are equal, round, and reactive to light.  Cardiovascular:     Rate and Rhythm: Normal rate and regular rhythm.     Pulses: Normal pulses.     Heart sounds: Normal heart sounds.  Pulmonary:     Effort: Pulmonary effort is normal. No respiratory distress.     Breath sounds: Normal breath sounds.  Abdominal:      General: Bowel sounds are normal. There is no distension.     Palpations: Abdomen is soft.     Tenderness: There is no abdominal tenderness. There is no guarding or rebound.  Musculoskeletal:        General: Normal range of motion.     Cervical back: Normal range of motion and neck supple.  Skin:    General: Skin is dry.     Capillary Refill: Capillary refill takes less than 2 seconds.     Findings: No erythema or rash.  Neurological:     General: No focal deficit present.     Deep Tendon Reflexes: Reflexes normal.  Psychiatric:        Mood and Affect: Mood normal.     ED Results / Procedures / Treatments   Labs (all labs ordered are listed, but only abnormal results are displayed) Labs Reviewed  RESP PANEL BY RT-PCR (RSV, FLU A&B, COVID)  [id number]  GROUP A STREP BY PCR    EKG None  Radiology No results found.  Procedures Procedures    Medications Ordered in ED Medications - No data to display  ED Course/ Medical Decision Making/ A&P                                 Medical Decision Making Patient with cough, sneezing congestion rhinorrhea   Amount and/or Complexity of Data Reviewed External Data Reviewed: notes.    Details: Previous notes reviewed  Labs: ordered.    Details: Negative covid flu RSV  Risk OTC drugs. Risk Details: Patient with rhinitis and sneezing, symptoms consistent with allergies.  Lungs are clear, no wheezing.  Negative covid and flu.  Start Zyrtec and nasal spray.      Final Clinical Impression(s) / ED Diagnoses Final diagnoses:  Seasonal allergies   No signs of systemic illness or infection. The patient is nontoxic-appearing on exam and vital signs are within normal limits.  I have reviewed the triage vital signs and the nursing notes. Pertinent labs & imaging results that were available during my care of the patient were reviewed by me and considered in my medical decision making (see chart for details). After history, exam, and  medical workup I feel the patient has been appropriately medically screened and is safe for discharge home. Pertinent diagnoses were discussed with the patient. Patient was given return precautions.    Rx / DC Orders ED Discharge Orders          Ordered    triamcinolone (NASACORT) 55 MCG/ACT AERO nasal inhaler  Daily        08/31/23 2324  Kathrin Folden, MD 08/31/23 2329

## 2023-08-31 NOTE — ED Triage Notes (Signed)
 Pt presents with runny nose, nasal congestion, otalgia, sore throat, dry non-productive cough x 4 days. Denies fever, ShOB. No sick contacts or recent travel.

## 2023-09-14 DIAGNOSIS — K5730 Diverticulosis of large intestine without perforation or abscess without bleeding: Secondary | ICD-10-CM | POA: Diagnosis not present

## 2023-09-14 DIAGNOSIS — Z87891 Personal history of nicotine dependence: Secondary | ICD-10-CM | POA: Diagnosis not present

## 2023-09-14 DIAGNOSIS — Z96653 Presence of artificial knee joint, bilateral: Secondary | ICD-10-CM | POA: Diagnosis not present

## 2023-09-14 DIAGNOSIS — N12 Tubulo-interstitial nephritis, not specified as acute or chronic: Secondary | ICD-10-CM | POA: Insufficient documentation

## 2023-09-14 DIAGNOSIS — I10 Essential (primary) hypertension: Secondary | ICD-10-CM | POA: Insufficient documentation

## 2023-09-14 DIAGNOSIS — R102 Pelvic and perineal pain: Secondary | ICD-10-CM | POA: Diagnosis not present

## 2023-09-14 DIAGNOSIS — R109 Unspecified abdominal pain: Secondary | ICD-10-CM | POA: Diagnosis present

## 2023-09-14 DIAGNOSIS — K449 Diaphragmatic hernia without obstruction or gangrene: Secondary | ICD-10-CM | POA: Diagnosis not present

## 2023-09-14 DIAGNOSIS — R160 Hepatomegaly, not elsewhere classified: Secondary | ICD-10-CM | POA: Diagnosis not present

## 2023-09-15 ENCOUNTER — Encounter (HOSPITAL_BASED_OUTPATIENT_CLINIC_OR_DEPARTMENT_OTHER): Payer: Self-pay | Admitting: Emergency Medicine

## 2023-09-15 ENCOUNTER — Other Ambulatory Visit: Payer: Self-pay

## 2023-09-15 ENCOUNTER — Emergency Department (HOSPITAL_BASED_OUTPATIENT_CLINIC_OR_DEPARTMENT_OTHER)
Admission: EM | Admit: 2023-09-15 | Discharge: 2023-09-15 | Disposition: A | Discharge status: Home / Self Care | Attending: Emergency Medicine | Admitting: Emergency Medicine

## 2023-09-15 ENCOUNTER — Emergency Department (HOSPITAL_BASED_OUTPATIENT_CLINIC_OR_DEPARTMENT_OTHER)

## 2023-09-15 DIAGNOSIS — R102 Pelvic and perineal pain: Secondary | ICD-10-CM | POA: Diagnosis not present

## 2023-09-15 DIAGNOSIS — K449 Diaphragmatic hernia without obstruction or gangrene: Secondary | ICD-10-CM | POA: Diagnosis not present

## 2023-09-15 DIAGNOSIS — N12 Tubulo-interstitial nephritis, not specified as acute or chronic: Secondary | ICD-10-CM

## 2023-09-15 DIAGNOSIS — K5730 Diverticulosis of large intestine without perforation or abscess without bleeding: Secondary | ICD-10-CM | POA: Diagnosis not present

## 2023-09-15 DIAGNOSIS — R160 Hepatomegaly, not elsewhere classified: Secondary | ICD-10-CM | POA: Diagnosis not present

## 2023-09-15 LAB — COMPREHENSIVE METABOLIC PANEL WITH GFR
ALT: 23 U/L (ref 0–44)
AST: 22 U/L (ref 15–41)
Albumin: 3.5 g/dL (ref 3.5–5.0)
Alkaline Phosphatase: 67 U/L (ref 38–126)
Anion gap: 9 (ref 5–15)
BUN: 18 mg/dL (ref 8–23)
CO2: 25 mmol/L (ref 22–32)
Calcium: 9.5 mg/dL (ref 8.9–10.3)
Chloride: 99 mmol/L (ref 98–111)
Creatinine, Ser: 0.71 mg/dL (ref 0.44–1.00)
GFR, Estimated: >60 mL/min (ref >60)
Glucose, Bld: 181 mg/dL — ABNORMAL HIGH (ref 70–99)
Potassium: 3.5 mmol/L (ref 3.5–5.1)
Sodium: 133 mmol/L — ABNORMAL LOW (ref 135–145)
Total Bilirubin: 0.6 mg/dL (ref 0.0–1.2)
Total Protein: 8.3 g/dL — ABNORMAL HIGH (ref 6.5–8.1)

## 2023-09-15 LAB — URINALYSIS, ROUTINE W REFLEX MICROSCOPIC
Bilirubin Urine: NEGATIVE
Glucose, UA: NEGATIVE mg/dL
Ketones, ur: NEGATIVE mg/dL
Nitrite: NEGATIVE
Protein, ur: 100 mg/dL — AB
Specific Gravity, Urine: 1.02 (ref 1.005–1.030)
pH: 6 (ref 5.0–8.0)

## 2023-09-15 LAB — URINALYSIS, MICROSCOPIC (REFLEX)

## 2023-09-15 LAB — CBC
HCT: 39 % (ref 36.0–46.0)
Hemoglobin: 12.8 g/dL (ref 12.0–15.0)
MCH: 27.8 pg (ref 26.0–34.0)
MCHC: 32.8 g/dL (ref 30.0–36.0)
MCV: 84.8 fL (ref 80.0–100.0)
Platelets: 387 10*3/uL (ref 150–400)
RBC: 4.6 MIL/uL (ref 3.87–5.11)
RDW: 13.3 % (ref 11.5–15.5)
WBC: 9.3 10*3/uL (ref 4.0–10.5)
nRBC: 0 % (ref 0.0–0.2)

## 2023-09-15 LAB — LIPASE, BLOOD: Lipase: 27 U/L (ref 11–51)

## 2023-09-15 MED ORDER — MORPHINE SULFATE (PF) 4 MG/ML IV SOLN
4.0000 mg | Freq: Once | INTRAVENOUS | Status: AC
  Administered 2023-09-15: 4 mg via INTRAVENOUS
  Filled 2023-09-15: qty 1

## 2023-09-15 MED ORDER — SODIUM CHLORIDE 0.9 % IV SOLN
2.0000 g | Freq: Once | INTRAVENOUS | Status: AC
  Administered 2023-09-15: 2 g via INTRAVENOUS
  Filled 2023-09-15: qty 20

## 2023-09-15 MED ORDER — CEPHALEXIN 500 MG PO CAPS: 500.0000 mg | ORAL_CAPSULE | Freq: Four times a day (QID) | ORAL | 0 refills | Status: AC

## 2023-09-15 MED ORDER — SODIUM CHLORIDE 0.9 % IV BOLUS
500.0000 mL | Freq: Once | INTRAVENOUS | Status: AC
  Administered 2023-09-15: 500 mL via INTRAVENOUS

## 2023-09-15 MED ORDER — ONDANSETRON HCL 4 MG/2ML IJ SOLN
4.0000 mg | Freq: Once | INTRAMUSCULAR | Status: AC
  Administered 2023-09-15: 4 mg via INTRAVENOUS
  Filled 2023-09-15: qty 2

## 2023-09-15 NOTE — Discharge Instructions (Signed)
 You were evaluated in the Emergency Department and after careful evaluation, we did not find any emergent condition requiring admission or further testing in the hospital.  Your exam/testing today is overall reassuring.  Symptoms seem to be due to a kidney infection.  Take the Keflex antibiotic as directed.  Use Tylenol or Motrin for discomfort.  Please return to the Emergency Department if you experience any worsening of your condition.   Thank you for allowing [place] to be a part of your care.

## 2023-09-15 NOTE — ED Notes (Signed)
 Notified lab of add- on urine cx.

## 2023-09-15 NOTE — ED Triage Notes (Signed)
 Pt reports suprapubic pain that radiates to the left flank, also c/o dysuria, hx of kidney stones, reports hematuria on Sat that has since stopped

## 2023-09-15 NOTE — ED Provider Notes (Signed)
 [facility]EMERGENCY DEPT [facility] Emergency Department Provider Note MRN:  [id number]  Arrival date & time: 09/15/23     Chief Complaint   Flank Pain   History of Present Illness   Frances Gray is a 73 y.o. year-old female with a history of diverticular presenting to the ED with chief complaint of abdominal pain.  2 days ago started having dysuria, this evening with left lower quadrant pain, seems to radiate to the left flank.  Denies fever, no change to stool.  Nausea but no vomiting.  Some blood in the urine recently.  Review of Systems  A thorough review of systems was obtained and all systems are negative except as noted in the HPI and PMH.   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, R knee s/p TKR 6/16   Diverticulitis    GERD (gastroesophageal reflux disease)    doesn't take any meds   Heart murmur    no symptoms   History of bronchitis winter of 2014   History of colon polyps    benign   History of kidney stones    Hyperlipidemia    Hypertension    takes Hyzaar daily   Perforation of sigmoid colon due to diverticulitis 09/24/2019   Pneumonia    Pre-diabetes    Vitamin D deficiency disease 08/14/2011   takes Vit D daily    Past Surgical History:  Procedure Laterality Date   BREAST BIOPSY Left    Breast ultrasound Right 03/02/2013   The 4mm complex cyst in the right breast is probably benign. Follow-up in 6 months with bilateral mammogram and (R) ultrasound   Breast Ultrasound Right 09/02/2013   There is a benign 3 mm complex cyst in the (R) breast at 9 o'clock   COLONOSCOPY     CYSTOSCOPY  07/25/2022   Procedure: CYSTOSCOPY;  Surgeon: Carver Fila, MD;  Location: [facility];  Service: Gynecology;;   ESOPHAGUS SURGERY  1996   tumor removed   (benign)   KNEE ARTHROSCOPY      bilateral   LAPAROSCOPIC LOW ANTERIOR RESECTION     robotic, with Dr. Michaell Cowing in 2023   LITHOTRIPSY  08/2011   R ureteral stone   POLYPECTOMY     PROCTOSCOPY N/A 08/24/2021   Procedure: RIGID PROCTOSCOPY;  Surgeon: Karie Soda, MD;  Location: [facility];  Service: General;  Laterality: N/A;   ROBOTIC ASSISTED SALPINGO OOPHERECTOMY Right 07/25/2022   Procedure: XI ROBOTIC ASSISTED UNILATERAL SALPINGO OOPHORECTOMY;  Surgeon: Carver Fila, MD;  Location: [facility];  Service: Gynecology;  Laterality: Right;   ROBOTIC ASSISTED TOTAL HYSTERECTOMY N/A 07/25/2022   Procedure: XI ROBOTIC ASSISTED TOTAL HYSTERECTOMY;LYSIS OF ADHESIONS;  Surgeon: Carver Fila, MD;  Location: [facility];  Service: Gynecology;  Laterality: N/A;   TOTAL KNEE ARTHROPLASTY Left 11/16/2012   Procedure: TOTAL KNEE ARTHROPLASTY- left;  Surgeon: Eulas Post, MD;  Location: [facility];  Service: Orthopedics;  Laterality: Left;   TOTAL KNEE ARTHROPLASTY Right 12/06/2014   Procedure: TOTAL KNEE ARTHROPLASTY;  Surgeon: Teryl Lucy, MD;  Location: [facility];  Service: Orthopedics;  Laterality: Right;   TUBAL LIGATION      Family History  Problem Relation Age of Onset   Ovarian cancer Mother 70   Colon  cancer Sister 80   Breast cancer Sister 91   Prostate cancer Cousin        mets   Esophageal cancer Neg Hx    Colon polyps Neg Hx    Rectal cancer Neg Hx    Stomach cancer Neg Hx    Endometrial cancer Neg Hx    Pancreatic cancer Neg Hx     Social History   Socioeconomic History   Marital status: Married    Spouse name: Not on file   Number of children: Not on file   Years of education: Not on file   Highest education level: Not on file  Occupational History   Occupation: retired  Tobacco Use   Smoking status: Former    Current packs/day: 0.00    Average packs/day: 0.3 packs/day for 10.0 years (2.5 ttl pk-yrs)    Types: Cigarettes    Start date: 6    Quit date: 1980    Years since quitting: 45.2    Passive  exposure: Past   Smokeless tobacco: Never   Tobacco comments:    quit smoking 13yrs ago  Vaping Use   Vaping status: Never Used  Substance and Sexual Activity   Alcohol use: No   Drug use: No   Sexual activity: Not Currently    Birth control/protection: Post-menopausal  Other Topics Concern   Not on file  Social History Narrative   Lives with spouse and g-son, temporarily mother in law (05/2014)   Social Drivers of Health   Financial Resource Strain: Low Risk  (08/26/2023)   Overall Financial Resource Strain (CARDIA)    Difficulty of Paying Living Expenses: Not hard at all  Food Insecurity: No Food Insecurity (08/26/2023)   Hunger Vital Sign    Worried About Running Out of Food in the Last Year: Never true    Ran Out of Food in the Last Year: Never true  Transportation Needs: No Transportation Needs (08/26/2023)   PRAPARE - Administrator, Civil Service (Medical): No    Lack of Transportation (Non-Medical): No  Physical Activity: Insufficiently Active (08/26/2023)   Exercise Vital Sign    Days of Exercise per Week: 3 days    Minutes of Exercise per Session: 20 min  Stress: No Stress Concern Present (08/26/2023)   [employer] of Occupational Health - Occupational Stress Questionnaire    Feeling of Stress : Not at all  Social Connections: Socially Integrated (08/26/2023)   Social Connection and Isolation Panel [NHANES]    Frequency of Communication with Friends and Family: More than three times a week    Frequency of Social Gatherings with Friends and Family: More than three times a week    Attends Religious Services: More than 4 times per year    Active Member of [employer] or Organizations: Yes    Attends Banker Meetings: More than 4 times per year    Marital Status: Married  Catering manager Violence: Not At Risk (08/26/2023)   Humiliation, Afraid, Rape, and Kick questionnaire    Fear of Current or Ex-Partner: No    Emotionally Abused: No     Physically Abused: No    Sexually Abused: No     Physical Exam   Vitals:   09/15/23 0012  BP: (!) 158/78  Pulse: 75  Resp: 18  Temp: 98.1 F (36.7 C)  SpO2: 99%    CONSTITUTIONAL: Well-appearing, NAD NEURO/PSYCH:  Alert and oriented x 3, no focal deficits EYES:  eyes equal and reactive  ENT/NECK:  no LAD, no JVD CARDIO: Regular rate, well-perfused, normal S1 and S2 PULM:  CTAB no wheezing or rhonchi GI/GU:  non-distended, non-tender MSK/SPINE:  No gross deformities, no edema SKIN:  no rash, atraumatic   *Additional and/or pertinent findings included in MDM below  Diagnostic and Interventional Summary    EKG Interpretation Date/Time:    Ventricular Rate:    PR Interval:    QRS Duration:    QT Interval:    QTC Calculation:   R Axis:      Text Interpretation:         Labs Reviewed  URINALYSIS, ROUTINE W REFLEX MICROSCOPIC - Abnormal; Notable for the following components:      Result Value   APPearance CLOUDY (*)    Hgb urine dipstick MODERATE (*)    Protein, ur 100 (*)    Leukocytes,Ua MODERATE (*)    All other components within normal limits  COMPREHENSIVE METABOLIC PANEL WITH GFR - Abnormal; Notable for the following components:   Sodium 133 (*)    Glucose, Bld 181 (*)    Total Protein 8.3 (*)    All other components within normal limits  URINALYSIS, MICROSCOPIC (REFLEX) - Abnormal; Notable for the following components:   Bacteria, UA FEW (*)    All other components within normal limits  URINE CULTURE  CBC  LIPASE, BLOOD    CT RENAL STONE STUDY  Final Result      Medications  cefTRIAXone (ROCEPHIN) 2 g in sodium chloride 0.9 % 100 mL IVPB (2 g Intravenous New Bag/Given 09/15/23 0202)  ondansetron (ZOFRAN) injection 4 mg (4 mg Intravenous Given 09/15/23 0049)  sodium chloride 0.9 % bolus 500 mL ( Intravenous Stopped 09/15/23 0200)  morphine (PF) 4 MG/ML injection 4 mg (4 mg Intravenous Given 09/15/23 0049)     Procedures  /  Critical  Care Procedures  ED Course and Medical Decision Making  Initial Impression and Ddx Favoring urinary etiology such as kidney stone or UTI,, nephritis, also considering diverticulitis given patient's history of the same.  Past medical/surgical history that increases complexity of ED encounter: Hypertension, kidney stones, diverticulitis with perforation  Interpretation of Diagnostics I personally reviewed the Laboratory Testing and my interpretation is as follows: No significant blood count or electrolyte disturbance.  Urinalysis with evidence to suggest infection, correlating with CT scan.  Patient Reassessment and Ultimate Disposition/Management     Patient feeling better, no emergent pathology found regarding workup.  Seems appropriate for outpatient management of likely pyelonephritis.  Patient management required discussion with the following services or consulting groups:  None  Complexity of Problems Addressed Acute illness or injury that poses threat of life of bodily function  Additional Data Reviewed and Analyzed Further history obtained from: Further history from spouse/family member  Additional Factors Impacting ED Encounter Risk Prescriptions and Consideration of hospitalization  Elmer Sow. Pilar Plate, MD [facility] Emergency Medicine [facility] [email or web address]  Final Clinical Impressions(s) / ED Diagnoses     ICD-10-CM   1. Pyelonephritis  N12       ED Discharge Orders          Ordered    cephALEXin (KEFLEX) 500 MG capsule  4 times daily        09/15/23 0203             Discharge Instructions Discussed with and Provided to Patient:     Discharge Instructions      You were evaluated in the Emergency Department and after  careful evaluation, we did not find any emergent condition requiring admission or further testing in the hospital.  Your exam/testing today is overall reassuring.  Symptoms seem to be due to a kidney infection.   Take the Keflex antibiotic as directed.  Use Tylenol or Motrin for discomfort.  Please return to the Emergency Department if you experience any worsening of your condition.   Thank you for allowing [place] to be a part of your care.       Sabas Sous, MD 09/15/23 [phone]

## 2023-09-17 LAB — URINE CULTURE: Culture: >=100000 — AB

## 2023-09-18 ENCOUNTER — Telehealth (HOSPITAL_BASED_OUTPATIENT_CLINIC_OR_DEPARTMENT_OTHER): Payer: Self-pay

## 2023-09-18 NOTE — Telephone Encounter (Signed)
 Post ED Visit - Positive Culture Follow-up  Culture report reviewed by antimicrobial stewardship pharmacist: [facility] [x]  Lora Paula, Pharm.D. []  Celedonio Miyamoto, Pharm.D., BCPS AQ-ID []  Garvin Fila, Pharm.D., BCPS []  Georgina Pillion, Pharm.D., BCPS []  [place], [place].D., BCPS, AAHIVP []  Estella Husk, Pharm.D., BCPS, AAHIVP []  Lysle Pearl, PharmD, BCPS []  Phillips Climes, PharmD, BCPS []  Agapito Games, PharmD, BCPS []  Verlan Friends, PharmD []  Mervyn Gay, PharmD, BCPS []  Vinnie Level, PharmD  [facility] []  Len Childs, PharmD []  Greer Pickerel, PharmD []  Adalberto Cole, PharmD []  Perlie Gold, Rph []  Lonell Face) Jean Rosenthal, PharmD []  Earl Many, PharmD []  Junita Push, PharmD []  Dorna Leitz, PharmD []  Terrilee Files, PharmD []  Lynann Beaver, PharmD []  Keturah Barre, PharmD []  Loralee Pacas, PharmD []  Bernadene Person, PharmD   Positive urine culture Treated with Cephalexin, organism sensitive to the same and no further patient follow-up is required at this time.  Sandria Senter 09/18/2023, 11:52 AM

## 2023-11-10 ENCOUNTER — Other Ambulatory Visit: Payer: Self-pay | Admitting: Internal Medicine

## 2023-11-25 NOTE — Patient Instructions (Addendum)
      Blood work was ordered.       Medications changes include :   start Ryblesus 3 mg daily - take 30 minutes prior to breakfast.   After one month we can increase to 7 mg       Return in about 6 months (around 05/27/2024) for follow up.

## 2023-11-26 ENCOUNTER — Ambulatory Visit (INDEPENDENT_AMBULATORY_CARE_PROVIDER_SITE_OTHER): Payer: Medicare Other | Admitting: Internal Medicine

## 2023-11-26 ENCOUNTER — Encounter: Payer: Self-pay | Admitting: Internal Medicine

## 2023-11-26 ENCOUNTER — Other Ambulatory Visit (HOSPITAL_COMMUNITY): Payer: Self-pay

## 2023-11-26 VITALS — BP 132/70 | HR 78 | Temp 98.1°F | Ht 64.0 in | Wt 295.0 lb

## 2023-11-26 DIAGNOSIS — M65331 Trigger finger, right middle finger: Secondary | ICD-10-CM

## 2023-11-26 DIAGNOSIS — E559 Vitamin D deficiency, unspecified: Secondary | ICD-10-CM

## 2023-11-26 DIAGNOSIS — I10 Essential (primary) hypertension: Secondary | ICD-10-CM | POA: Diagnosis not present

## 2023-11-26 DIAGNOSIS — E1169 Type 2 diabetes mellitus with other specified complication: Secondary | ICD-10-CM

## 2023-11-26 DIAGNOSIS — E7849 Other hyperlipidemia: Secondary | ICD-10-CM | POA: Diagnosis not present

## 2023-11-26 DIAGNOSIS — Z7984 Long term (current) use of oral hypoglycemic drugs: Secondary | ICD-10-CM | POA: Diagnosis not present

## 2023-11-26 LAB — COMPREHENSIVE METABOLIC PANEL WITH GFR
ALT: 18 U/L (ref 0–35)
AST: 20 U/L (ref 0–37)
Albumin: 3.8 g/dL (ref 3.5–5.2)
Alkaline Phosphatase: 71 U/L (ref 39–117)
BUN: 13 mg/dL (ref 6–23)
CO2: 34 meq/L — ABNORMAL HIGH (ref 19–32)
Calcium: 9.8 mg/dL (ref 8.4–10.5)
Chloride: 100 meq/L (ref 96–112)
Creatinine, Ser: 0.68 mg/dL (ref 0.40–1.20)
GFR: 86.05 mL/min (ref >60.00)
Glucose, Bld: 164 mg/dL — ABNORMAL HIGH (ref 70–99)
Potassium: 3.7 meq/L (ref 3.5–5.1)
Sodium: 137 meq/L (ref 135–145)
Total Bilirubin: 0.3 mg/dL (ref 0.2–1.2)
Total Protein: 7.7 g/dL (ref 6.0–8.3)

## 2023-11-26 LAB — MICROALBUMIN / CREATININE URINE RATIO
Creatinine,U: 115.6 mg/dL
Microalb Creat Ratio: 7.7 mg/g (ref 0.0–30.0)
Microalb, Ur: 0.9 mg/dL (ref 0.0–1.9)

## 2023-11-26 LAB — LIPID PANEL
Cholesterol: 184 mg/dL (ref 0–200)
HDL: 57.3 mg/dL (ref >39.00)
LDL Cholesterol: 102 mg/dL — ABNORMAL HIGH (ref 0–99)
NonHDL: 126.49
Total CHOL/HDL Ratio: 3
Triglycerides: 121 mg/dL (ref 0.0–149.0)
VLDL: 24.2 mg/dL (ref 0.0–40.0)

## 2023-11-26 LAB — VITAMIN D 25 HYDROXY (VIT D DEFICIENCY, FRACTURES): VITD: 42.99 ng/mL (ref 30.00–100.00)

## 2023-11-26 LAB — HEMOGLOBIN A1C: Hgb A1c MFr Bld: 8.6 % — ABNORMAL HIGH (ref 4.6–6.5)

## 2023-11-26 MED ORDER — RYBELSUS 3 MG PO TABS: 3.0000 mg | ORAL_TABLET | Freq: Every day | ORAL | 0 refills | Status: DC

## 2023-11-26 NOTE — Progress Notes (Signed)
 Subjective:    Patient ID: Frances Gray, female    DOB: 1951-03-10, 73 y.o.   MRN: [id number]     HPI Frances Gray is here for follow up of her chronic medical problems.  No changes or concerns.   No exercise.   Trying to eat healthy.    Medications and allergies reviewed with patient and updated if appropriate.  Current Outpatient Medications on File Prior to Visit  Medication Sig Dispense Refill   Biotin  10 MG TABS Take 10 mg by mouth daily.     calcium  carbonate (CALCIUM  600) 600 MG TABS tablet Take 1 tablet (600 mg total) by mouth 2 (two) times daily with a meal. 60 tablet 6   cetirizine (ZYRTEC) 10 MG tablet Take 10 mg by mouth as needed for allergies.     Cholecalciferol  (VITAMIN D3) 2000 UNITS TABS Take 2,000 Units by mouth every morning.      clobetasol (OLUX) 0.05 % topical foam Apply 1 Application topically every other day.     finasteride  (PROSCAR ) 5 MG tablet Take 5 mg by mouth daily.     fluticasone  (FLONASE ) 50 MCG/ACT nasal spray Place 1 spray into both nostrils daily as needed for allergies or rhinitis.   0   guaiFENesin  (MUCINEX ) 600 MG 12 hr tablet Take 2 tablets (1,200 mg total) by mouth 2 (two) times daily. 30 tablet 0   losartan -hydrochlorothiazide  (HYZAAR) 100-25 MG tablet TAKE 1 TABLET BY MOUTH EVERY DAY 90 tablet 1   Multiple Vitamin (MULTIVITAMIN) tablet Take 1 tablet by mouth every morning.      Multiple Vitamins-Minerals (ZINC PO) Take 1 tablet by mouth daily.     potassium chloride  (KLOR-CON  M) 10 MEQ tablet Take 1 tablet (10 mEq total) by mouth 2 (two) times daily. 6 tablet 0   rosuvastatin  (CRESTOR ) 5 MG tablet TAKE 1 TABLET BY MOUTH TWICE WEEKLY 24 tablet 1   sennosides-docusate sodium  (SENOKOT-S) 8.6-50 MG tablet Take 2 tablets by mouth at bedtime. For AFTER surgery, do not take if having diarrhea     tacrolimus (PROTOPIC) 0.1 % ointment Apply to frontal hair line daily to every other day and use to face daily to twice daily as needed for itching.      triamcinolone  (NASACORT ) 55 MCG/ACT AERO nasal inhaler Place 2 sprays into the nose daily. 1 each 0   No current facility-administered medications on file prior to visit.     Review of Systems  Constitutional:  Negative for fever.  Respiratory:  Negative for cough, shortness of breath and wheezing.   Cardiovascular:  Negative for chest pain, palpitations and leg swelling.  Neurological:  Negative for light-headedness, numbness and headaches.       Objective:   Vitals:   11/26/23 0958  BP: 132/70  Pulse: 78  Temp: 98.1 F (36.7 C)  SpO2: 94%   BP Readings from Last 3 Encounters:  11/26/23 132/70  09/15/23 (!) 158/78  08/31/23 (!) 157/80   Wt Readings from Last 3 Encounters:  11/26/23 295 lb (133.8 kg)  09/15/23 278 lb 15.9 oz (126.5 kg)  08/31/23 279 lb (126.6 kg)   Body mass index is 50.64 kg/m.    Physical Exam Constitutional:      General: She is not in acute distress.    Appearance: Normal appearance.  HENT:     Head: Normocephalic and atraumatic.   Eyes:     Conjunctiva/sclera: Conjunctivae normal.    Cardiovascular:     Rate and Rhythm:  Normal rate and regular rhythm.     Heart sounds: Normal heart sounds.  Pulmonary:     Effort: Pulmonary effort is normal. No respiratory distress.     Breath sounds: Normal breath sounds. No wheezing.   Musculoskeletal:     Cervical back: Neck supple.     Right lower leg: No edema.     Left lower leg: No edema.  Lymphadenopathy:     Cervical: No cervical adenopathy.   Skin:    General: Skin is warm and dry.     Findings: No rash.   Neurological:     Mental Status: She is alert. Mental status is at baseline.   Psychiatric:        Mood and Affect: Mood normal.        Behavior: Behavior normal.          Diabetic Foot Exam - Simple   Simple Foot Form Diabetic Foot exam was performed with the following findings: Yes 11/26/2023 10:26 AM  Visual Inspection No deformities, no ulcerations, no other  skin breakdown bilaterally: Yes Sensation Testing Intact to touch and monofilament testing bilaterally: Yes Pulse Check Posterior Tibialis and Dorsalis pulse intact bilaterally: Yes Comments      Lab Results  Component Value Date   WBC 9.3 09/15/2023   HGB 12.8 09/15/2023   HCT 39.0 09/15/2023   PLT 387 09/15/2023   GLUCOSE 181 (H) 09/15/2023   CHOL 197 05/28/2023   TRIG 137.0 05/28/2023   HDL 58.50 05/28/2023   LDLCALC 111 (H) 05/28/2023   ALT 23 09/15/2023   AST 22 09/15/2023   NA 133 (L) 09/15/2023   K 3.5 09/15/2023   CL 99 09/15/2023   CREATININE 0.71 09/15/2023   BUN 18 09/15/2023   CO2 25 09/15/2023   TSH 2.29 05/29/2022   INR 1.2 06/12/2021   HGBA1C 8.1 (H) 05/28/2023   MICROALBUR <0.7 11/26/2022     Assessment & Plan:    See Problem List for Assessment and Plan of chronic medical problems.

## 2023-11-26 NOTE — Assessment & Plan Note (Signed)
 Chronic Stressed weight loss Discussed diabetic medications that may help with weight loss Has seen nutrition Stressed regular exercise Stressed healthy diet low in sugars/carbs and small portions

## 2023-11-26 NOTE — Assessment & Plan Note (Signed)
 Chronic Lab Results  Component Value Date   LDLCALC 111 (H) 05/28/2023   Regular exercise and healthy diet encouraged Check lipid panel, CMP Continue Crestor  5 mg twice weekly

## 2023-11-26 NOTE — Assessment & Plan Note (Signed)
 Chronic Taking vitamin D daily Check vitamin D level

## 2023-11-26 NOTE — Assessment & Plan Note (Signed)
Chronic Blood pressure well controlled CMP Continue losartan-HCTZ 100-25 mg daily 

## 2023-11-26 NOTE — Assessment & Plan Note (Signed)
 New Has locked up a couple of times Mild Deferred referral at this time

## 2023-11-26 NOTE — Assessment & Plan Note (Addendum)
 Chronic With Hyperlipidemia and hyperglycemia  Lab Results  Component Value Date   HGBA1C 8.1 (H) 05/28/2023   Sugars not controlled Check A1c, urine albumin/creatinine ratio Stressed regular exercise, diabetic diet Currently lifestyle controlled Start Rybelsus  3 mg daily -- if tolerated will increase to 7 mg after one month

## 2023-11-27 ENCOUNTER — Ambulatory Visit: Payer: Self-pay | Admitting: Internal Medicine

## 2023-11-27 ENCOUNTER — Other Ambulatory Visit (HOSPITAL_COMMUNITY): Payer: Self-pay

## 2023-12-09 DIAGNOSIS — Z1231 Encounter for screening mammogram for malignant neoplasm of breast: Secondary | ICD-10-CM | POA: Diagnosis not present

## 2023-12-09 LAB — HM MAMMOGRAPHY

## 2023-12-11 ENCOUNTER — Encounter: Payer: Self-pay | Admitting: Internal Medicine

## 2023-12-15 ENCOUNTER — Other Ambulatory Visit: Payer: Self-pay | Admitting: Internal Medicine

## 2023-12-15 MED ORDER — RYBELSUS 3 MG PO TABS: 3.0000 mg | ORAL_TABLET | Freq: Every day | ORAL | 0 refills | Status: DC

## 2023-12-15 NOTE — Telephone Encounter (Unsigned)
 Copied from CRM [id number]. Topic: Clinical - Medication Refill >> Dec 15, 2023  9:19 AM Charlet HERO wrote: Medication: Semaglutide  (RYBELSUS ) 3 MG TABS  Has the patient contacted their pharmacy? {yes/no:[id number]}  Was told by doctor to call so it will be increased  This is the patient's preferred pharmacy:  [id number] [place], [place] - [address] [facility] [place] [address] Phone: [phone] Fax: [phone]  Is this the correct pharmacy for this prescription? Yes If no, delete pharmacy and type the correct one.   Has the prescription been filled recently? Yes  Is the patient out of the medication? Yes  Has the patient been seen for an appointment in the last year OR does the patient have an upcoming appointment? Yes  Can we respond through MyChart? Yes  Agent: Please be advised that Rx refills may take up to 3 business days. We ask that you follow-up with your pharmacy.

## 2023-12-23 ENCOUNTER — Other Ambulatory Visit: Payer: Self-pay

## 2023-12-23 ENCOUNTER — Telehealth: Payer: Self-pay | Admitting: Internal Medicine

## 2023-12-23 MED ORDER — RYBELSUS 3 MG PO TABS: 3.0000 mg | ORAL_TABLET | Freq: Every day | ORAL | 0 refills | Status: DC

## 2023-12-23 NOTE — Telephone Encounter (Signed)
 Copied from CRM [id number]. Topic: Clinical - Medication Question >> Dec 23, 2023 10:21 AM Rea C wrote: Reason for CRM: Semaglutide  (RYBELSUS ) 3 MG TABS  Patient is almost done, and stated that Dr. Geofm advised her to call when she was almost finished and see if she needs to be switched to a higher dose.   [phone] BENNIE)

## 2023-12-27 MED ORDER — RYBELSUS 7 MG PO TABS: 7.0000 mg | ORAL_TABLET | Freq: Every day | ORAL | 0 refills | Status: DC

## 2023-12-27 NOTE — Telephone Encounter (Signed)
 Rx for 7 mg dose of rybelsus  sent to [facility] for next refill - refill x 30 d - can increase to 14 mg if she does ok with 7 mg

## 2023-12-29 ENCOUNTER — Telehealth: Payer: Self-pay

## 2023-12-29 NOTE — Telephone Encounter (Unsigned)
 Copied from CRM [id number]. Topic: Clinical - Medical Advice >> Dec 29, 2023  4:50 PM Chasity T wrote: Reason for CRM: patient is calling in to return call from [place]. I have advised the patient the notes that was left regarding the dosage and she says that is fine with the increase.

## 2024-01-04 ENCOUNTER — Other Ambulatory Visit: Payer: Self-pay | Admitting: Internal Medicine

## 2024-02-23 ENCOUNTER — Other Ambulatory Visit: Payer: Self-pay | Admitting: Internal Medicine

## 2024-02-23 MED ORDER — RYBELSUS 7 MG PO TABS: 7.0000 mg | ORAL_TABLET | Freq: Every day | ORAL | 0 refills | Status: DC

## 2024-02-23 NOTE — Telephone Encounter (Signed)
 Copied from CRM #[id number]. Topic: Clinical - Medication Refill >> Feb 23, 2024  8:51 AM Willma R wrote: Medication: Semaglutide  (RYBELSUS ) 7 MG TABS **Patient would like to know if the dosage is being changed to a higher amount.  Has the patient contacted their pharmacy? Yes, call Dr  This is the patient's preferred pharmacy:  [id number] [place], [place] - [address] [facility] [place] [address] Phone: [phone] Fax: [phone]  Is this the correct pharmacy for this prescription? Yes If no, delete pharmacy and type the correct one.   Has the prescription been filled recently? No  Is the patient out of the medication? No  Has the patient been seen for an appointment in the last year OR does the patient have an upcoming appointment? Yes  Can we respond through MyChart? Yes  Agent: Please be advised that Rx refills may take up to 3 business days. We ask that you follow-up with your pharmacy.

## 2024-02-23 NOTE — Telephone Encounter (Signed)
 Patient would like to know if the dosage is being changed to a higher amount. Please advise.

## 2024-03-25 ENCOUNTER — Encounter: Payer: Self-pay | Admitting: Internal Medicine

## 2024-03-25 MED ORDER — RYBELSUS 14 MG PO TABS
14.0000 mg | ORAL_TABLET | Freq: Every day | ORAL | 1 refills | Status: AC
Start: 2024-03-25 — End: ?

## 2024-04-02 DIAGNOSIS — H2513 Age-related nuclear cataract, bilateral: Secondary | ICD-10-CM | POA: Diagnosis not present

## 2024-04-02 DIAGNOSIS — H5203 Hypermetropia, bilateral: Secondary | ICD-10-CM | POA: Diagnosis not present

## 2024-04-02 DIAGNOSIS — E119 Type 2 diabetes mellitus without complications: Secondary | ICD-10-CM | POA: Diagnosis not present

## 2024-05-06 ENCOUNTER — Other Ambulatory Visit: Payer: Self-pay | Admitting: Internal Medicine

## 2024-05-25 NOTE — Patient Instructions (Addendum)
° ° ° ° °  Blood work was ordered.       Medications changes include :   None    A referral was ordered and someone will call you to schedule an appointment.     Return in about 6 months (around 11/24/2024) for Physical Exam.

## 2024-05-25 NOTE — Progress Notes (Unsigned)
 Subjective:    Patient ID: Frances Gray, female    DOB: 26-Apr-1951, 73 y.o.   MRN: [id number]     HPI Maresha is here for follow up of her chronic medical problems.  Placed on rybelsus  last visit.  Doing well with it.  No side effects.  She has lost weight.    No regular exercise.    Having more urinary urgency and if she does not hurry may have some incontinence.       Medications and allergies reviewed with patient and updated if appropriate.  Medications Ordered Prior to Encounter[1]   Review of Systems  Constitutional:  Negative for fever.  Respiratory:  Negative for cough, shortness of breath and wheezing.   Cardiovascular:  Negative for chest pain, palpitations and leg swelling.  Gastrointestinal:  Negative for constipation and nausea.       No gerd  Neurological:  Negative for light-headedness and headaches.       Objective:   Vitals:   05/26/24 1050  BP: 122/80  Pulse: (!) 53  Temp: 98.7 F (37.1 C)  SpO2: 98%   BP Readings from Last 3 Encounters:  05/26/24 122/80  11/26/23 132/70  09/15/23 (!) 158/78   Wt Readings from Last 3 Encounters:  05/26/24 263 lb (119.3 kg)  11/26/23 295 lb (133.8 kg)  09/15/23 278 lb 15.9 oz (126.5 kg)   Body mass index is 45.14 kg/m.    Physical Exam Constitutional:      General: She is not in acute distress.    Appearance: Normal appearance.  HENT:     Head: Normocephalic and atraumatic.  Eyes:     Conjunctiva/sclera: Conjunctivae normal.  Neck:     Comments: Thyromegaly on right side Cardiovascular:     Rate and Rhythm: Normal rate and regular rhythm.     Heart sounds: Normal heart sounds.  Pulmonary:     Effort: Pulmonary effort is normal. No respiratory distress.     Breath sounds: Normal breath sounds. No wheezing.  Musculoskeletal:     Cervical back: Neck supple.     Right lower leg: No edema.     Left lower leg: No edema.  Lymphadenopathy:     Cervical: No cervical adenopathy.  Skin:     General: Skin is warm and dry.     Findings: No rash.  Neurological:     Mental Status: She is alert. Mental status is at baseline.  Psychiatric:        Mood and Affect: Mood normal.        Behavior: Behavior normal.        Lab Results  Component Value Date   WBC 9.3 09/15/2023   HGB 12.8 09/15/2023   HCT 39.0 09/15/2023   PLT 387 09/15/2023   GLUCOSE 164 (H) 11/26/2023   CHOL 184 11/26/2023   TRIG 121.0 11/26/2023   HDL 57.30 11/26/2023   LDLCALC 102 (H) 11/26/2023   ALT 18 11/26/2023   AST 20 11/26/2023   NA 137 11/26/2023   K 3.7 11/26/2023   CL 100 11/26/2023   CREATININE 0.68 11/26/2023   BUN 13 11/26/2023   CO2 34 (H) 11/26/2023   TSH 2.29 05/29/2022   INR 1.2 06/12/2021   HGBA1C 8.6 (H) 11/26/2023   MICROALBUR 0.9 11/26/2023     Assessment & Plan:    See Problem List for Assessment and Plan of chronic medical problems.       [1]  Current Outpatient Medications on  File Prior to Visit  Medication Sig Dispense Refill   Biotin  10 MG TABS Take 10 mg by mouth daily.     calcium  carbonate (CALCIUM  600) 600 MG TABS tablet Take 1 tablet (600 mg total) by mouth 2 (two) times daily with a meal. 60 tablet 6   cetirizine (ZYRTEC) 10 MG tablet Take 10 mg by mouth as needed for allergies.     Cholecalciferol  (VITAMIN D3) 2000 UNITS TABS Take 2,000 Units by mouth every morning.      clobetasol (OLUX) 0.05 % topical foam Apply 1 Application topically every other day.     finasteride  (PROSCAR ) 5 MG tablet Take 5 mg by mouth daily.     fluticasone  (FLONASE ) 50 MCG/ACT nasal spray Place 1 spray into both nostrils daily as needed for allergies or rhinitis.   0   guaiFENesin  (MUCINEX ) 600 MG 12 hr tablet Take 2 tablets (1,200 mg total) by mouth 2 (two) times daily. 30 tablet 0   losartan -hydrochlorothiazide  (HYZAAR) 100-25 MG tablet TAKE 1 TABLET BY MOUTH EVERY DAY 90 tablet 1   Multiple Vitamin (MULTIVITAMIN) tablet Take 1 tablet by mouth every morning.      Multiple  Vitamins-Minerals (ZINC PO) Take 1 tablet by mouth daily.     potassium chloride  (KLOR-CON  M) 10 MEQ tablet Take 1 tablet (10 mEq total) by mouth 2 (two) times daily. 6 tablet 0   rosuvastatin  (CRESTOR ) 5 MG tablet TAKE 1 TABLET BY MOUTH TWICE WEEKLY 24 tablet 1   Semaglutide  (RYBELSUS ) 14 MG TABS Take 1 tablet (14 mg total) by mouth daily. 90 tablet 1   sennosides-docusate sodium  (SENOKOT-S) 8.6-50 MG tablet Take 2 tablets by mouth at bedtime. For AFTER surgery, do not take if having diarrhea     tacrolimus (PROTOPIC) 0.1 % ointment Apply to frontal hair line daily to every other day and use to face daily to twice daily as needed for itching.     triamcinolone  (NASACORT ) 55 MCG/ACT AERO nasal inhaler Place 2 sprays into the nose daily. 1 each 0   No current facility-administered medications on file prior to visit.

## 2024-05-26 ENCOUNTER — Ambulatory Visit: Admitting: Internal Medicine

## 2024-05-26 VITALS — BP 122/80 | HR 53 | Temp 98.7°F | Ht 64.0 in | Wt 263.0 lb

## 2024-05-26 DIAGNOSIS — E559 Vitamin D deficiency, unspecified: Secondary | ICD-10-CM | POA: Diagnosis not present

## 2024-05-26 DIAGNOSIS — Z7984 Long term (current) use of oral hypoglycemic drugs: Secondary | ICD-10-CM

## 2024-05-26 DIAGNOSIS — I152 Hypertension secondary to endocrine disorders: Secondary | ICD-10-CM

## 2024-05-26 DIAGNOSIS — E1159 Type 2 diabetes mellitus with other circulatory complications: Secondary | ICD-10-CM

## 2024-05-26 DIAGNOSIS — E785 Hyperlipidemia, unspecified: Secondary | ICD-10-CM

## 2024-05-26 DIAGNOSIS — M85859 Other specified disorders of bone density and structure, unspecified thigh: Secondary | ICD-10-CM | POA: Diagnosis not present

## 2024-05-26 DIAGNOSIS — E1169 Type 2 diabetes mellitus with other specified complication: Secondary | ICD-10-CM

## 2024-05-26 LAB — COMPREHENSIVE METABOLIC PANEL WITH GFR
ALT: 16 U/L (ref 3–35)
AST: 19 U/L (ref 5–37)
Albumin: 4 g/dL (ref 3.5–5.2)
Alkaline Phosphatase: 62 U/L (ref 39–117)
BUN: 11 mg/dL (ref 6–23)
CO2: 33 meq/L — ABNORMAL HIGH (ref 19–32)
Calcium: 10.7 mg/dL — ABNORMAL HIGH (ref 8.4–10.5)
Chloride: 101 meq/L (ref 96–112)
Creatinine, Ser: 0.7 mg/dL (ref 0.40–1.20)
GFR: 85.15 mL/min (ref >60.00)
Glucose, Bld: 98 mg/dL (ref 70–99)
Potassium: 3.7 meq/L (ref 3.5–5.1)
Sodium: 139 meq/L (ref 135–145)
Total Bilirubin: 0.4 mg/dL (ref 0.2–1.2)
Total Protein: 8.2 g/dL (ref 6.0–8.3)

## 2024-05-26 LAB — LIPID PANEL
Cholesterol: 175 mg/dL (ref 28–200)
HDL: 61 mg/dL (ref >39.00)
LDL Cholesterol: 93 mg/dL (ref 10–99)
NonHDL: 113.73
Total CHOL/HDL Ratio: 3
Triglycerides: 102 mg/dL (ref 10.0–149.0)
VLDL: 20.4 mg/dL (ref 0.0–40.0)

## 2024-05-26 LAB — HEMOGLOBIN A1C: Hgb A1c MFr Bld: 6.7 % — ABNORMAL HIGH (ref 4.6–6.5)

## 2024-05-26 MED ORDER — RYBELSUS 14 MG PO TABS: 14.0000 mg | ORAL_TABLET | Freq: Every day | ORAL | 1 refills | Status: AC

## 2024-05-26 NOTE — Assessment & Plan Note (Addendum)
 Chronic History of osteopenia-DEXA has improved and is currently in normal range Recheck DEXA after 5 years Dexa up to date Continue taking calcium  and vitamin d   Not exercising

## 2024-05-26 NOTE — Assessment & Plan Note (Addendum)
 Chronic BMI 45.14 Encouraged weight loss-has lost some weight with Rybelsus  Has seen nutrition in the past Encouraged regular exercise Stressed healthy diet low in sugars/carbs and small portions

## 2024-05-26 NOTE — Assessment & Plan Note (Signed)
 Chronic Taking vitamin D daily Check vitamin D level

## 2024-05-26 NOTE — Assessment & Plan Note (Signed)
 Chronic Lab Results  Component Value Date   LDLCALC 102 (H) 11/26/2023   Regular exercise and healthy diet encouraged Check lipid panel, CMP Continue Crestor  5 mg twice weekly

## 2024-05-26 NOTE — Assessment & Plan Note (Signed)
 Chronic Associated with hyperlipidemia and hyperglycemia  Lab Results  Component Value Date   HGBA1C 8.6 (H) 11/26/2023   Sugars not controlled at her last visit Started on Rybelsus  and currently taking 14 mg daily Check A1c Stressed regular exercise, diabetic diet Continue Rybelsus  14 mg daily-Will add additional medication if sugars are not well-controlled

## 2024-05-26 NOTE — Assessment & Plan Note (Signed)
Chronic Blood pressure well controlled CMP Continue losartan-HCTZ 100-25 mg daily 

## 2024-05-27 ENCOUNTER — Ambulatory Visit: Admitting: Internal Medicine

## 2024-05-28 ENCOUNTER — Ambulatory Visit: Payer: Self-pay | Admitting: Internal Medicine

## 2024-06-17 ENCOUNTER — Other Ambulatory Visit: Payer: Self-pay | Admitting: Internal Medicine

## 2024-08-31 ENCOUNTER — Ambulatory Visit

## 2024-11-24 ENCOUNTER — Ambulatory Visit: Admitting: Internal Medicine
